# Patient Record
Sex: Female | Born: 1945 | Race: White | Hispanic: No | Marital: Married | Smoking: Never smoker
Health system: Southern US, Community
[De-identification: ages and names within clinical notes are randomized; demographics above are authoritative.]

## PROBLEM LIST (undated history)

## (undated) DIAGNOSIS — E119 Type 2 diabetes mellitus without complications: Secondary | ICD-10-CM

## (undated) DIAGNOSIS — I10 Essential (primary) hypertension: Secondary | ICD-10-CM

## (undated) DIAGNOSIS — I4891 Unspecified atrial fibrillation: Secondary | ICD-10-CM

## (undated) DIAGNOSIS — I639 Cerebral infarction, unspecified: Secondary | ICD-10-CM

## (undated) DIAGNOSIS — I499 Cardiac arrhythmia, unspecified: Secondary | ICD-10-CM

## (undated) HISTORY — PX: INSERT / REPLACE / REMOVE PACEMAKER: SUR710

## (undated) HISTORY — PX: ELBOW SURGERY: SHX618

## (undated) HISTORY — PX: CARPAL TUNNEL RELEASE: SHX101

## (undated) HISTORY — PX: SHOULDER ARTHROSCOPY: SHX128

## (undated) HISTORY — DX: Cerebral infarction, unspecified: I63.9

## (undated) HISTORY — PX: ABDOMINAL HYSTERECTOMY: SHX81

---

## 2002-12-11 ENCOUNTER — Encounter: Payer: Self-pay | Admitting: Orthopedic Surgery

## 2002-12-11 ENCOUNTER — Inpatient Hospital Stay (HOSPITAL_COMMUNITY): Admission: RE | Admit: 2002-12-11 | Discharge: 2002-12-14 | Payer: Self-pay | Admitting: Orthopedic Surgery

## 2002-12-14 ENCOUNTER — Inpatient Hospital Stay
Admission: RE | Admit: 2002-12-14 | Discharge: 2002-12-21 | Payer: Self-pay | Admitting: Physical Medicine & Rehabilitation

## 2003-04-05 HISTORY — PX: JOINT REPLACEMENT: SHX530

## 2003-07-03 ENCOUNTER — Other Ambulatory Visit: Payer: Self-pay

## 2003-07-04 ENCOUNTER — Other Ambulatory Visit: Payer: Self-pay

## 2003-07-23 ENCOUNTER — Inpatient Hospital Stay (HOSPITAL_COMMUNITY): Admission: RE | Admit: 2003-07-23 | Discharge: 2003-07-31 | Payer: Self-pay | Admitting: Orthopedic Surgery

## 2003-12-04 ENCOUNTER — Other Ambulatory Visit: Payer: Self-pay

## 2003-12-05 ENCOUNTER — Other Ambulatory Visit: Payer: Self-pay

## 2004-02-01 ENCOUNTER — Ambulatory Visit: Payer: Self-pay | Admitting: Cardiology

## 2004-07-06 ENCOUNTER — Emergency Department: Payer: Self-pay | Admitting: Emergency Medicine

## 2004-10-26 IMAGING — CR DG CHEST 2V
2 series · 2 of 2 positions shown · non-contrast
Comparison: none

CLINICAL DATA: Elevated temps.
 CHEST TWO VIEWS
 Comparison 07/24/03.  
 Cardiomegaly and prominent caliber of the pulmonary vessels compatible with vascular congestion.  Rather subtle ill defined density, posterior aspect of the right lower lobe, compatible with pneumonia/mild atelectasis.  It is possible there is some mild infiltrate at the left lateral base.
 IMPRESSION
 Findings are compatible with mild infiltrates, lower lung zones.  Cardiomegaly and vascular congestion.  No edema.

[view not recorded (1 of 2)]
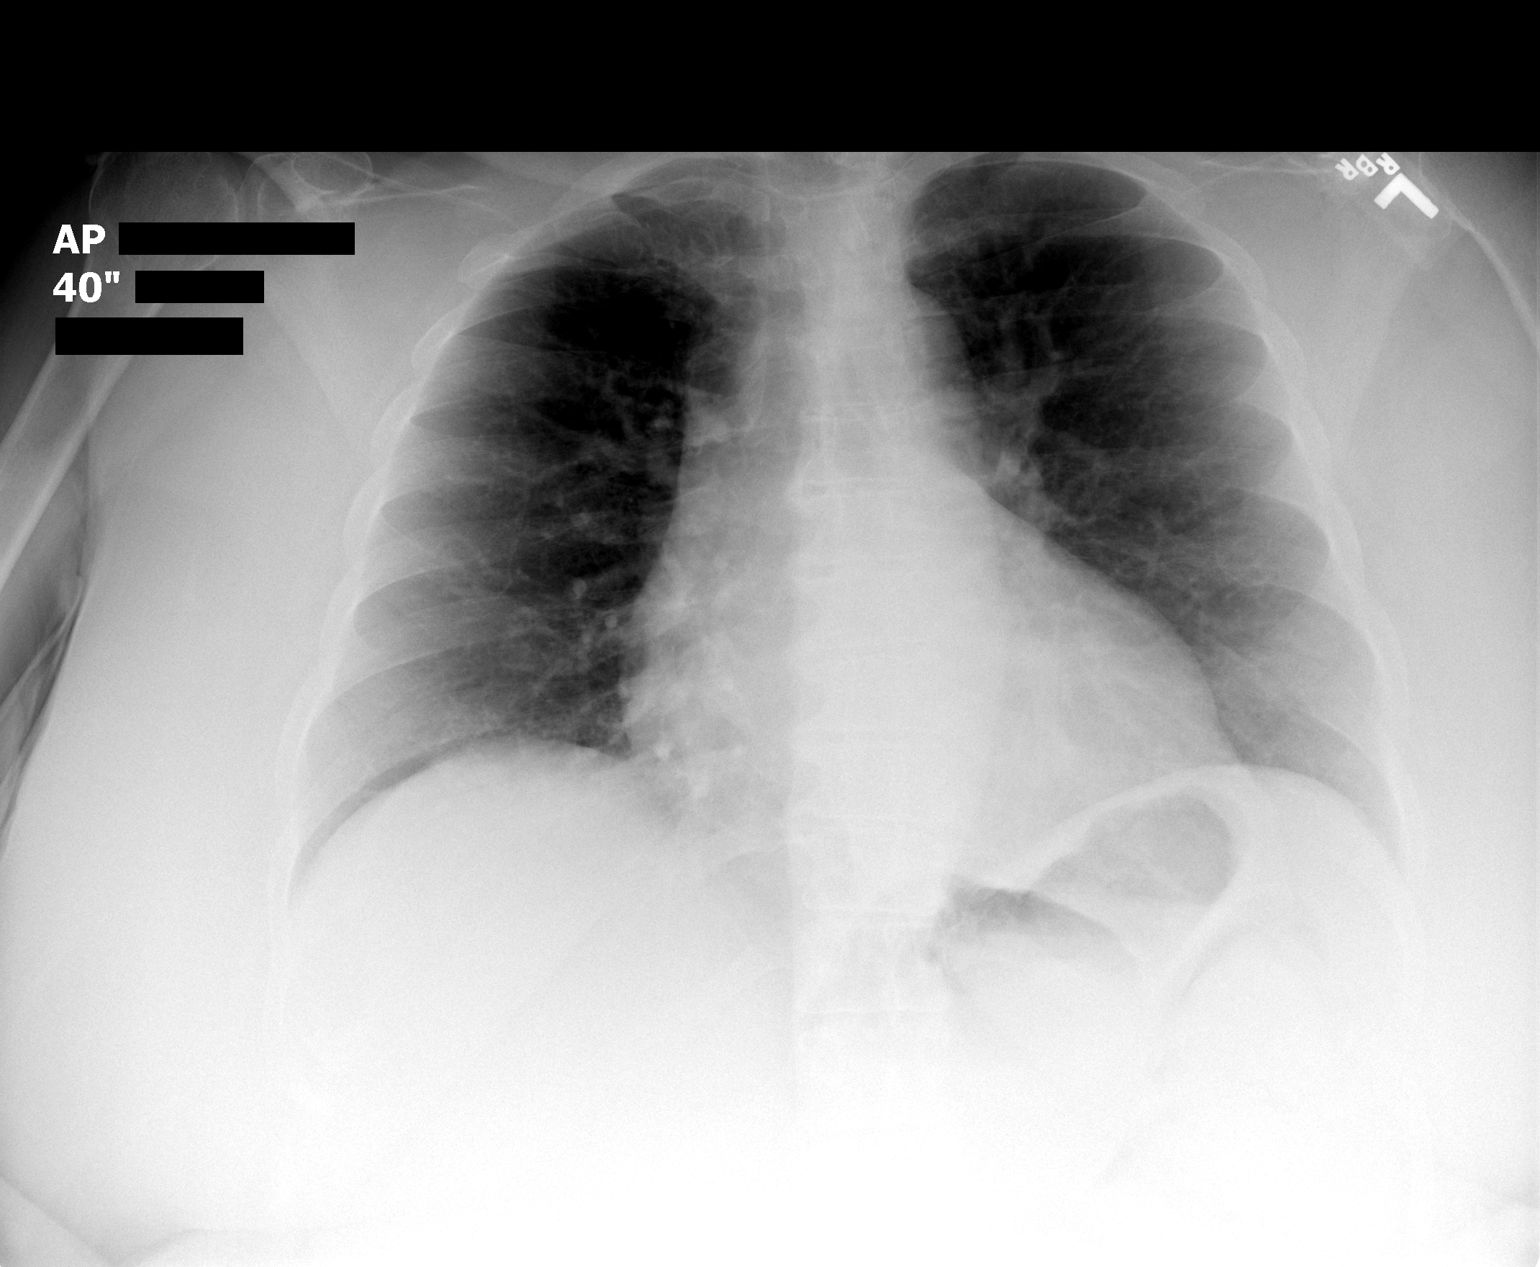

[view not recorded (2 of 2)]
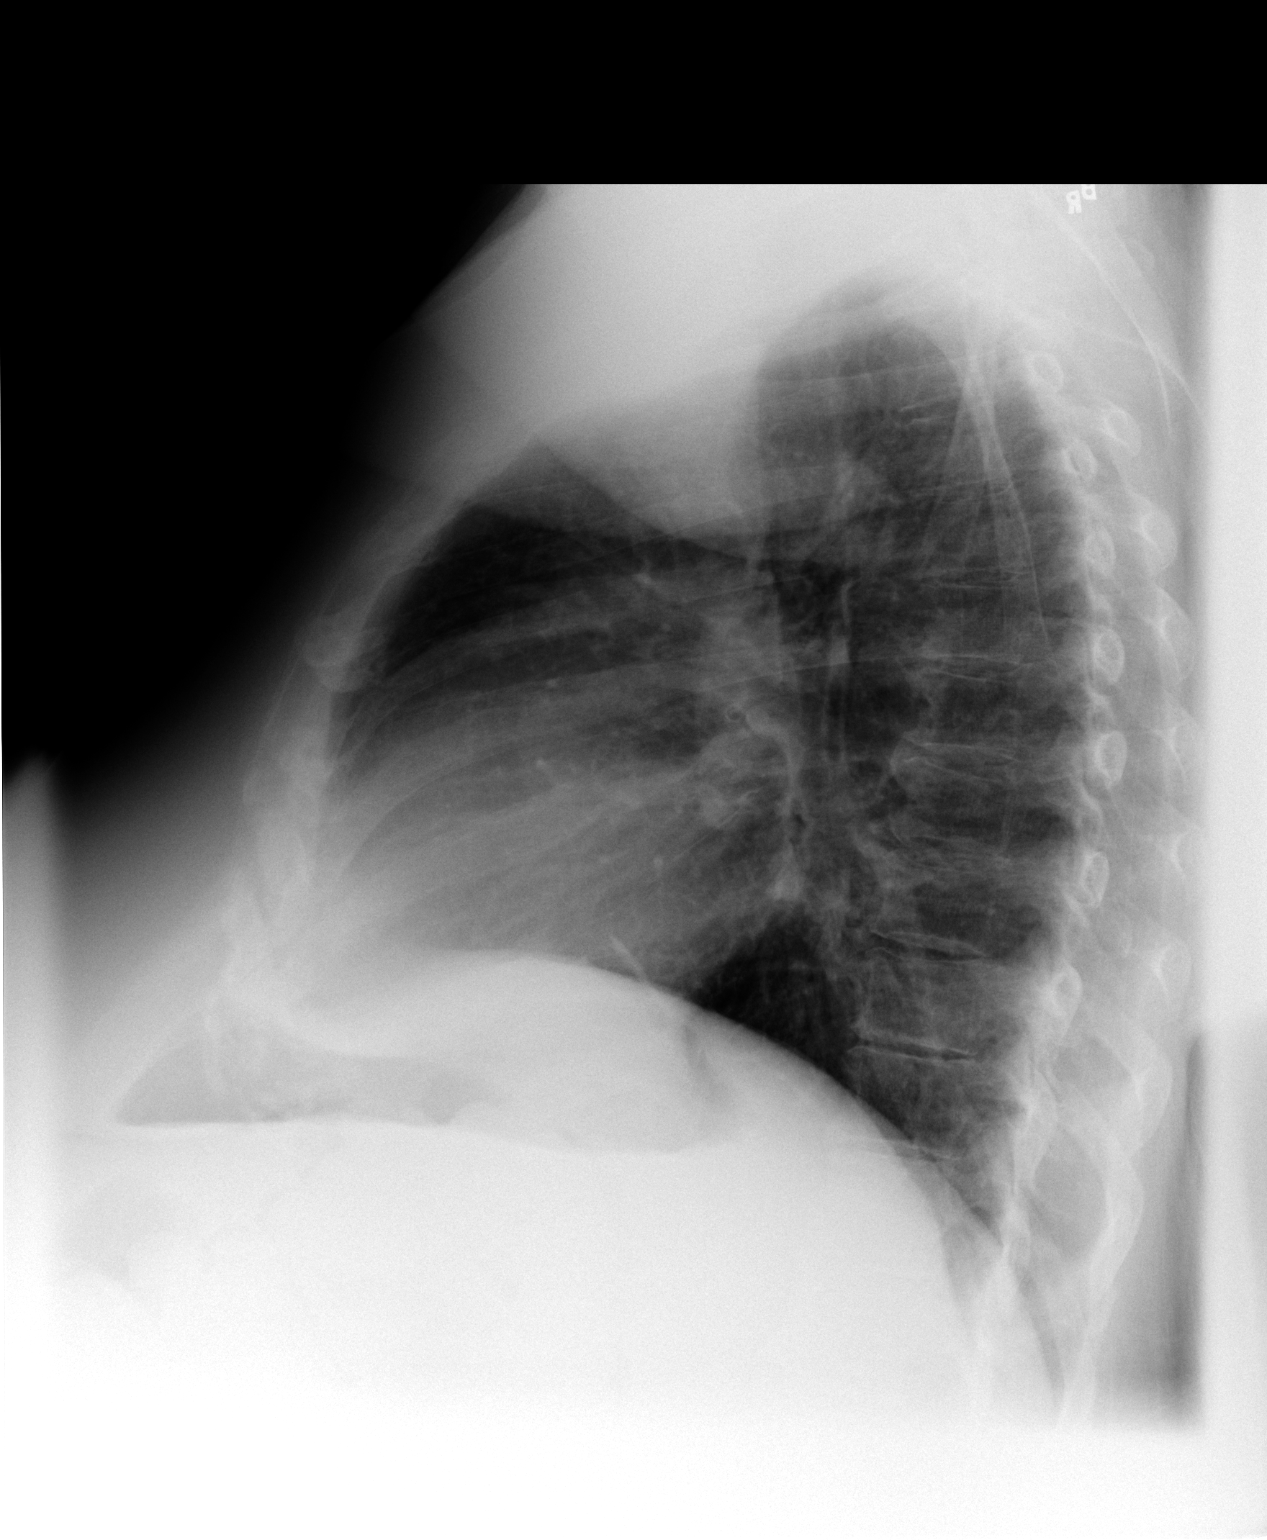

[2 of 2 positions shown; findings below may reference images not displayed]

## 2004-11-05 ENCOUNTER — Ambulatory Visit: Payer: Self-pay

## 2004-11-11 ENCOUNTER — Other Ambulatory Visit: Payer: Self-pay

## 2004-11-11 ENCOUNTER — Inpatient Hospital Stay: Payer: Self-pay | Admitting: General Surgery

## 2004-11-11 ENCOUNTER — Ambulatory Visit: Payer: Self-pay

## 2005-03-29 ENCOUNTER — Other Ambulatory Visit: Payer: Self-pay

## 2005-03-29 ENCOUNTER — Inpatient Hospital Stay: Payer: Self-pay | Admitting: Cardiology

## 2005-03-29 ENCOUNTER — Ambulatory Visit: Payer: Self-pay | Admitting: Cardiology

## 2005-04-14 ENCOUNTER — Ambulatory Visit: Payer: Self-pay | Admitting: Internal Medicine

## 2005-08-20 ENCOUNTER — Other Ambulatory Visit: Payer: Self-pay

## 2005-08-20 ENCOUNTER — Emergency Department: Payer: Self-pay | Admitting: Emergency Medicine

## 2005-12-05 ENCOUNTER — Emergency Department: Payer: Self-pay | Admitting: Emergency Medicine

## 2005-12-05 ENCOUNTER — Other Ambulatory Visit: Payer: Self-pay

## 2005-12-26 ENCOUNTER — Emergency Department: Payer: Self-pay | Admitting: Emergency Medicine

## 2006-05-08 ENCOUNTER — Emergency Department: Payer: Self-pay | Admitting: Unknown Physician Specialty

## 2006-05-08 ENCOUNTER — Other Ambulatory Visit: Payer: Self-pay

## 2006-11-15 ENCOUNTER — Ambulatory Visit: Payer: Self-pay | Admitting: Internal Medicine

## 2006-11-28 ENCOUNTER — Ambulatory Visit: Payer: Self-pay | Admitting: Internal Medicine

## 2007-04-15 ENCOUNTER — Emergency Department: Payer: Self-pay | Admitting: Emergency Medicine

## 2007-07-26 ENCOUNTER — Inpatient Hospital Stay: Payer: Self-pay | Admitting: Internal Medicine

## 2007-07-26 ENCOUNTER — Other Ambulatory Visit: Payer: Self-pay

## 2007-08-03 ENCOUNTER — Ambulatory Visit: Payer: Self-pay | Admitting: Internal Medicine

## 2007-12-05 ENCOUNTER — Ambulatory Visit: Payer: Self-pay | Admitting: Internal Medicine

## 2008-02-14 ENCOUNTER — Ambulatory Visit: Payer: Self-pay | Admitting: Internal Medicine

## 2008-05-17 ENCOUNTER — Inpatient Hospital Stay: Payer: Self-pay | Admitting: Internal Medicine

## 2008-09-26 ENCOUNTER — Ambulatory Visit: Payer: Self-pay | Admitting: Internal Medicine

## 2009-01-02 ENCOUNTER — Ambulatory Visit: Payer: Self-pay | Admitting: General Surgery

## 2009-01-02 HISTORY — PX: COLONOSCOPY: SHX174

## 2009-01-16 ENCOUNTER — Ambulatory Visit: Payer: Self-pay | Admitting: Internal Medicine

## 2009-05-25 ENCOUNTER — Emergency Department: Payer: Self-pay | Admitting: Emergency Medicine

## 2010-01-18 ENCOUNTER — Ambulatory Visit: Payer: Self-pay | Admitting: Internal Medicine

## 2011-02-28 ENCOUNTER — Ambulatory Visit: Payer: Self-pay | Admitting: Internal Medicine

## 2011-03-04 ENCOUNTER — Ambulatory Visit: Payer: Self-pay | Admitting: Internal Medicine

## 2011-05-12 ENCOUNTER — Ambulatory Visit: Payer: Self-pay | Admitting: Unknown Physician Specialty

## 2011-05-12 LAB — BASIC METABOLIC PANEL
Chloride: 102 mmol/L (ref 98–107)
EGFR (Non-African Amer.): >60
Osmolality: 286 (ref 275–301)
Potassium: 3.9 mmol/L (ref 3.5–5.1)
Sodium: 143 mmol/L (ref 136–145)

## 2011-05-12 LAB — URINALYSIS, COMPLETE
Ketone: NEGATIVE
Protein: NEGATIVE
Specific Gravity: 1.004 (ref 1.003–1.030)
Squamous Epithelial: 1
WBC UR: <1 /HPF (ref 0–5)

## 2011-05-12 LAB — CBC
HCT: 34.7 % — ABNORMAL LOW (ref 35.0–47.0)
HGB: 11.6 g/dL — ABNORMAL LOW (ref 12.0–16.0)
MCH: 31.9 pg (ref 26.0–34.0)
MCV: 95 fL (ref 80–100)
RBC: 3.65 10*6/uL — ABNORMAL LOW (ref 3.80–5.20)

## 2011-05-18 ENCOUNTER — Ambulatory Visit: Payer: Self-pay | Admitting: Unknown Physician Specialty

## 2011-05-18 LAB — PROTIME-INR: Prothrombin Time: 13.3 secs (ref 11.5–14.7)

## 2011-12-30 ENCOUNTER — Ambulatory Visit: Payer: Self-pay | Admitting: Internal Medicine

## 2012-01-08 ENCOUNTER — Emergency Department: Payer: Self-pay | Admitting: Emergency Medicine

## 2012-01-08 LAB — URINALYSIS, COMPLETE
Hyaline Cast: 25
Ketone: NEGATIVE
Protein: NEGATIVE
Specific Gravity: 1.015 (ref 1.003–1.030)
Squamous Epithelial: 8
WBC UR: 7 /HPF (ref 0–5)

## 2012-01-08 LAB — CBC
MCH: 31.5 pg (ref 26.0–34.0)
MCHC: 33.9 g/dL (ref 32.0–36.0)
MCV: 93 fL (ref 80–100)
Platelet: 226 10*3/uL (ref 150–440)
RDW: 13.9 % (ref 11.5–14.5)

## 2012-01-08 LAB — BASIC METABOLIC PANEL
Calcium, Total: 8.3 mg/dL — ABNORMAL LOW (ref 8.5–10.1)
Chloride: 101 mmol/L (ref 98–107)
Co2: 21 mmol/L (ref 21–32)
Glucose: 178 mg/dL — ABNORMAL HIGH (ref 65–99)
Sodium: 136 mmol/L (ref 136–145)

## 2012-01-08 LAB — TROPONIN I: Troponin-I: <0.02 ng/mL

## 2012-02-29 ENCOUNTER — Ambulatory Visit: Payer: Self-pay | Admitting: Internal Medicine

## 2012-07-21 ENCOUNTER — Emergency Department: Payer: Self-pay | Admitting: Internal Medicine

## 2012-07-21 ENCOUNTER — Encounter (HOSPITAL_COMMUNITY): Payer: Self-pay

## 2012-07-21 ENCOUNTER — Inpatient Hospital Stay (HOSPITAL_COMMUNITY)
Admission: AD | Admit: 2012-07-21 | Discharge: 2012-07-25 | DRG: 065 | Disposition: A | Discharge status: Home / Self Care | Payer: Medicare Other | Source: Other Acute Inpatient Hospital | Attending: Neurology | Admitting: Neurology

## 2012-07-21 DIAGNOSIS — I619 Nontraumatic intracerebral hemorrhage, unspecified: Secondary | ICD-10-CM

## 2012-07-21 DIAGNOSIS — Z794 Long term (current) use of insulin: Secondary | ICD-10-CM

## 2012-07-21 DIAGNOSIS — E785 Hyperlipidemia, unspecified: Secondary | ICD-10-CM | POA: Diagnosis present

## 2012-07-21 DIAGNOSIS — Z950 Presence of cardiac pacemaker: Secondary | ICD-10-CM

## 2012-07-21 DIAGNOSIS — I4891 Unspecified atrial fibrillation: Secondary | ICD-10-CM | POA: Diagnosis present

## 2012-07-21 DIAGNOSIS — I10 Essential (primary) hypertension: Secondary | ICD-10-CM

## 2012-07-21 DIAGNOSIS — R569 Unspecified convulsions: Secondary | ICD-10-CM | POA: Diagnosis present

## 2012-07-21 DIAGNOSIS — Z6841 Body Mass Index (BMI) 40.0 and over, adult: Secondary | ICD-10-CM

## 2012-07-21 DIAGNOSIS — Z7901 Long term (current) use of anticoagulants: Secondary | ICD-10-CM

## 2012-07-21 DIAGNOSIS — E669 Obesity, unspecified: Secondary | ICD-10-CM | POA: Diagnosis present

## 2012-07-21 DIAGNOSIS — E119 Type 2 diabetes mellitus without complications: Secondary | ICD-10-CM

## 2012-07-21 DIAGNOSIS — R4701 Aphasia: Secondary | ICD-10-CM | POA: Diagnosis present

## 2012-07-21 DIAGNOSIS — I639 Cerebral infarction, unspecified: Secondary | ICD-10-CM

## 2012-07-21 DIAGNOSIS — G8190 Hemiplegia, unspecified affecting unspecified side: Secondary | ICD-10-CM | POA: Diagnosis present

## 2012-07-21 DIAGNOSIS — Z79899 Other long term (current) drug therapy: Secondary | ICD-10-CM

## 2012-07-21 HISTORY — DX: Type 2 diabetes mellitus without complications: E11.9

## 2012-07-21 HISTORY — DX: Unspecified atrial fibrillation: I48.91

## 2012-07-21 HISTORY — DX: Cardiac arrhythmia, unspecified: I49.9

## 2012-07-21 HISTORY — DX: Essential (primary) hypertension: I10

## 2012-07-21 HISTORY — DX: Cerebral infarction, unspecified: I63.9

## 2012-07-21 LAB — URINALYSIS, COMPLETE
Bacteria: NONE SEEN
Blood: NEGATIVE
Leukocyte Esterase: NEGATIVE
Nitrite: NEGATIVE
Ph: 7 (ref 4.5–8.0)
RBC,UR: NONE SEEN /HPF (ref 0–5)
Specific Gravity: 1.01 (ref 1.003–1.030)

## 2012-07-21 LAB — CBC
HCT: 32.2 % — ABNORMAL LOW (ref 36.0–46.0)
MCH: 31.5 pg (ref 26.0–34.0)
MCV: 93 fL (ref 80–100)
MCV: 89.7 fL (ref 78.0–100.0)
RDW: 13.2 % (ref 11.5–15.5)
WBC: 9.7 10*3/uL (ref 4.0–10.5)

## 2012-07-21 LAB — COMPREHENSIVE METABOLIC PANEL
Albumin: 3.6 g/dL (ref 3.5–5.2)
Alkaline Phosphatase: 80 U/L (ref 50–136)
BUN: 17 mg/dL (ref 7–18)
BUN: 16 mg/dL (ref 6–23)
Bilirubin,Total: 0.4 mg/dL (ref 0.2–1.0)
CO2: 30 mEq/L (ref 19–32)
Calcium, Total: 9.1 mg/dL (ref 8.5–10.1)
Chloride: 100 mEq/L (ref 96–112)
Creatinine, Ser: 0.87 mg/dL (ref 0.50–1.10)
EGFR (Non-African Amer.): 54 — ABNORMAL LOW
GFR calc non Af Amer: 69 mL/min — ABNORMAL LOW (ref >90)
Osmolality: 282 (ref 275–301)
SGOT(AST): 20 U/L (ref 15–37)
Sodium: 138 mmol/L (ref 136–145)
Total Bilirubin: 0.4 mg/dL (ref 0.3–1.2)

## 2012-07-21 LAB — TROPONIN I: Troponin-I: <0.02 ng/mL

## 2012-07-21 LAB — PROTIME-INR
INR: 1.5
INR: 1.29 (ref 0.00–1.49)
Prothrombin Time: 15.8 seconds — ABNORMAL HIGH (ref 11.6–15.2)

## 2012-07-21 MED ORDER — ACETAMINOPHEN 650 MG RE SUPP: 650.0000 mg | RECTAL | Status: DC | PRN

## 2012-07-21 MED ORDER — SENNOSIDES-DOCUSATE SODIUM 8.6-50 MG PO TABS
1.0000 | ORAL_TABLET | Freq: Two times a day (BID) | ORAL | Status: DC
  Administered 2012-07-22 – 2012-07-24 (×5): 1 via ORAL
  Filled 2012-07-21 (×8): qty 1

## 2012-07-21 MED ORDER — INSULIN ASPART 100 UNIT/ML ~~LOC~~ SOLN
2.0000 [IU] | SUBCUTANEOUS | Status: DC
  Administered 2012-07-21: 2 [IU] via SUBCUTANEOUS

## 2012-07-21 MED ORDER — FUROSEMIDE 20 MG PO TABS
20.0000 mg | ORAL_TABLET | Freq: Every day | ORAL | Status: DC
  Administered 2012-07-23 – 2012-07-25 (×3): 20 mg via ORAL
  Filled 2012-07-21 (×4): qty 1

## 2012-07-21 MED ORDER — CITALOPRAM HYDROBROMIDE 10 MG PO TABS
10.0000 mg | ORAL_TABLET | Freq: Every day | ORAL | Status: DC
  Administered 2012-07-22 – 2012-07-25 (×4): 10 mg via ORAL
  Filled 2012-07-21 (×4): qty 1

## 2012-07-21 MED ORDER — LABETALOL HCL 5 MG/ML IV SOLN: 10.0000 mg | INTRAVENOUS | Status: DC | PRN

## 2012-07-21 MED ORDER — MORPHINE SULFATE 2 MG/ML IJ SOLN
1.0000 mg | Freq: Once | INTRAMUSCULAR | Status: AC
  Administered 2012-07-21: 1 mg via INTRAVENOUS
  Filled 2012-07-21: qty 1

## 2012-07-21 MED ORDER — ONDANSETRON HCL 4 MG/2ML IJ SOLN: 4.0000 mg | Freq: Four times a day (QID) | INTRAMUSCULAR | Status: DC | PRN

## 2012-07-21 MED ORDER — ACETAMINOPHEN 325 MG PO TABS
650.0000 mg | ORAL_TABLET | ORAL | Status: DC | PRN
  Administered 2012-07-21 – 2012-07-24 (×5): 650 mg via ORAL
  Filled 2012-07-21 (×2): qty 2
  Filled 2012-07-21: qty 1
  Filled 2012-07-21 (×2): qty 2
  Filled 2012-07-21: qty 1

## 2012-07-21 MED ORDER — PANTOPRAZOLE SODIUM 40 MG IV SOLR
40.0000 mg | Freq: Every day | INTRAVENOUS | Status: DC
  Administered 2012-07-21 – 2012-07-23 (×3): 40 mg via INTRAVENOUS
  Filled 2012-07-21 (×5): qty 40

## 2012-07-21 MED ORDER — SODIUM CHLORIDE 0.9 % IV SOLN
INTRAVENOUS | Status: DC
  Administered 2012-07-21 – 2012-07-22 (×2): via INTRAVENOUS

## 2012-07-21 MED ORDER — SODIUM CHLORIDE 0.9 % IV SOLN
1000.0000 mg | Freq: Two times a day (BID) | INTRAVENOUS | Status: DC
  Administered 2012-07-21 – 2012-07-24 (×6): 1000 mg via INTRAVENOUS
  Filled 2012-07-21 (×7): qty 10

## 2012-07-21 MED ORDER — INSULIN ASPART 100 UNIT/ML ~~LOC~~ SOLN
2.0000 [IU] | Freq: Three times a day (TID) | SUBCUTANEOUS | Status: DC
  Administered 2012-07-22 (×2): 2 [IU] via SUBCUTANEOUS

## 2012-07-21 NOTE — Evaluation (Signed)
Speech Language Pathology Evaluation Patient Details Name: Traci Carter MRN: [id number] DOB: 04/10/1945 Today's Date: 07/21/2012 Time: 4098-1191 SLP Time Calculation (min): 44 min  Problem List: There is no problem list on file for this patient.  Past Medical History: No past medical history on file. Past Surgical History: No past surgical history on file. HPI:  67 y/o female admitted to [facility] by EMS from home.  Per medical report patient with seizures at home prior to EMS arrival.  Patient with right sided facial droop, right sided weakness, and difficulty speaking.  CT  indicates left parietal intracranial hemorrrhage.  Cognitive Linguistic Evaluation indicated per stroke protocol.    Assessment / Plan / Recommendation Clinical Impression Cognitive Linguistic Evaluation completed.     Results indicate moderate expressive aphasia.  Cognitive evaluation limited secondary to aphasia.  Patient with capacity to produce real words as evidenced by incidental speech during conversational speech.  Structured speech tasks resulted in high prevalence of paraphasias ( literal, verbal and neologistic).  Receptive language intact for  Yes/no questions and completion of one-to two-step commands.   Paraphasias present during reading aloud task but patient able to write to name and words w/o difficulty.  Recommend Skilled ST treatment in acute care setting to improve functional communication skills.   SLP Assessment  Patient needs continued Speech Lanaguage Pathology Services    Follow Up Recommendations  Inpatient Rehab    Frequency and Duration min 2x/week  2 weeks      SLP Goals  SLP Goals 1.  Label 10/10 items used in ADL's during confrontational naming task with moderate combination of cueing types to utilize speech strategies 2.  Repeat 5/5 SLP's production of simple phrases with no evidence of paraphasias.  3.  Produce automatic speech acts with moderate verbal  prompts to reduce perseveration  Potential to Achieve Goals: Good Potential Considerations: Cooperation/participation level;Previous level of function  SLP Evaluation Prior Functioning  Cognitive/Linguistic Baseline: Within functional limits Type of Home: House Lives With: Spouse Available Help at Discharge: Available 24 hours/day Education: 12th grade  Vocation: Retired   IT consultant  Overall Cognitive Status: Impaired/Different from baseline Arousal/Alertness: Awake/alert Orientation Level: Oriented X4 Attention: Divided Divided Attention: Impaired Memory: Appears intact Awareness: Appears intact Problem Solving: Impaired Problem Solving Impairment: Verbal basic;Functional basic Behaviors: Perseveration Safety/Judgment: Appears intact    Comprehension  Auditory Comprehension Yes/No Questions: Within Functional Limits Commands: Within Functional Limits Conversation: Complex EffectiveTechniques: Extra processing time Visual Recognition/Discrimination Discrimination: Not tested Reading Comprehension Reading Status: Impaired Word level: Impaired Sentence Level: Impaired Functional Environmental (signs, name badge): Impaired    Expression Expression Primary Mode of Expression: Verbal Verbal Expression Overall Verbal Expression: Impaired Initiation: Impaired Automatic Speech: Counting;Day of week;Month of year Level of Generative/Spontaneous Verbalization: Word Repetition: Impaired Level of Impairment: Word level;Phrase level;Sentence level Naming: Impairment Responsive: 0-25% accurate Confrontation: Impaired Convergent: 0-24% accurate Divergent: 0-24% accurate Verbal Errors: Semantic paraphasias;Phonemic paraphasias;Perseveration;Aware of errors Pragmatics: No impairment Interfering Components: Speech intelligibility Effective Techniques: Open ended questions;Semantic cues;Sentence completion;Phonemic cues Non-Verbal Means of Communication: Not applicable   Oral  / Motor Oral Motor/Sensory Function Overall Oral Motor/Sensory Function: Impaired Labial ROM: Reduced right Labial Symmetry: Abnormal symmetry right Labial Strength: Reduced Labial Sensation: Reduced Lingual ROM: Reduced right Lingual Symmetry: Abnormal symmetry right Lingual Strength: Reduced Lingual Sensation: Reduced Facial ROM: Reduced right Facial Symmetry: Within Functional Limits Facial Strength: Within Functional Limits Facial Sensation: Within Functional Limits Velum: Within Functional Limits Mandible: Within Functional Limits Motor Speech Overall Motor Speech:  Impaired Respiration: Within functional limits Articulation: Impaired Intelligibility: Intelligibility reduced Word: 25-49% accurate Phrase: 0-24% accurate Sentence: 0-24% accurate Motor Planning: Impaired Level of Impairment: Phrase Motor Speech Errors: Aware   Doreene Nest MS, CCC-SLP [phone] [facility] 07/21/2012, 3:56 PM

## 2012-07-21 NOTE — H&P (Addendum)
  CC: aphasia, seizure  History is obtained from:patient  HPI: Traci Carter is a 67 y.o. female with a history of htn, DM,a fib on coumadin who had a egneralized seizure this morning. She intially develped difficulty speaking last night, but then this mornign had seizure activity. She was taken to [facility] where she was given ativan and a CT showed a small left frontal hemorrhage. She was therefore transferred to [facility].    LKW: last night tpa given: no, hemorrhage  ROS: A 14 point ROS was performed and is negative except as noted in the HPI.  PMH  HTN DM Afib  Social History: Tob: neg  Exam: Current vital signs: BP 154/77  Pulse 83  Temp(Src) 98.2 F (36.8 C)  Resp 15  Ht 5\' 2"  (1.575 m)  Wt 102.3 kg (225 lb 8.5 oz)  BMI 41.24 kg/m2  SpO2 95% Vital signs in last 24 hours: Temp:  [98.2 F (36.8 C)] 98.2 F (36.8 C) (04/19 1200) Pulse Rate:  [83] 83 (04/19 1200) Resp:  [15] 15 (04/19 1200) BP: (154)/(77) 154/77 mmHg (04/19 1200) SpO2:  [95 %] 95 % (04/19 1200) Weight:  [102.3 kg (225 lb 8.5 oz)] 102.3 kg (225 lb 8.5 oz) (04/19 1200)  General: in bed, NAD CV: RRR Mental Status: Patient is awake, alert, oriented to person, place, month, year, and situation. She has a mild expressive aphasia, but is able to understand and follow commands.  Cranial Nerves: II: Visual Fields are full. Pupils are equal, round, and reactive to light.   III,IV, VI: EOMI without ptosis or diploplia.  V: Facial sensation is symmetric to temperature VII: Facial movement is symmetric.  VIII: hearing is intact to voice X: Uvula elevates symmetrically XI: Shoulder shrug is symmetric. XII: tongue is midline without atrophy or fasciculations.  Motor: Tone is normal. Bulk is normal. 5/5 strength was present in all four extremities.  Sensory: Sensation is symmetric to light touch and temperature in the arms and legs. Deep Tendon Reflexes: 2+ and symmetric in the biceps and patellae.   Plantars: Toes are downgoing bilaterally.  Cerebellar: FNF and HKS are intact bilaterally Gait: Not tested   I have reviewed labs in epic and the results pertinent to this consultation are: Initial INR 1.5, after 2 U FFP 1.29.   Reviewed CT head, small left frontal hematoma.   Impression: 67 yo F with small left frontal hematoma who presented with seizure. I suspect bleed started last night when her speech problem started. INR has corrected to < 1.3 and she received 10mg  vit K. Will monitor tonight in ICU.   Recommendations: 1) BP control, < 160/90 2) restart home lasix, citalopram 3) ICU hyperglycemia protocol.  4) Keppra 1000mg  BID 5) no antiplatelets or anticoagulants.  6) SCDs for prophy 7) Labetalol for BP control 8) PT,OT, ST  This patient is critically ill and at significant risk of neurological worsening, death and care requires constant monitoring of vital signs, hemodynamics,respiratory and cardiac monitoring, neurological assessment, discussion with family, other specialists and medical decision making of high complexity. I spent 45 minutes of neurocritical care time  in the care of  this patient.  Ritta Slot, MD [facility] [phone]  If 7pm- 7am, please page neurology on call at [phone].  07/21/2012  8:16 PM

## 2012-07-22 ENCOUNTER — Inpatient Hospital Stay (HOSPITAL_COMMUNITY): Payer: Medicare Other

## 2012-07-22 ENCOUNTER — Encounter (HOSPITAL_COMMUNITY): Payer: Self-pay | Admitting: *Deleted

## 2012-07-22 DIAGNOSIS — I6350 Cerebral infarction due to unspecified occlusion or stenosis of unspecified cerebral artery: Secondary | ICD-10-CM

## 2012-07-22 LAB — GLUCOSE, CAPILLARY

## 2012-07-22 MED ORDER — IOHEXOL 350 MG/ML SOLN
50.0000 mL | Freq: Once | INTRAVENOUS | Status: AC | PRN
  Administered 2012-07-22: 50 mL via INTRAVENOUS

## 2012-07-22 MED ORDER — INSULIN ASPART 100 UNIT/ML ~~LOC~~ SOLN
2.0000 [IU] | Freq: Three times a day (TID) | SUBCUTANEOUS | Status: DC
  Administered 2012-07-22: 4 [IU] via SUBCUTANEOUS
  Administered 2012-07-23: 2 [IU] via SUBCUTANEOUS
  Administered 2012-07-23: 4 [IU] via SUBCUTANEOUS
  Administered 2012-07-23: 2 [IU] via SUBCUTANEOUS
  Administered 2012-07-24: 4 [IU] via SUBCUTANEOUS

## 2012-07-22 MED ORDER — HYDROCODONE-ACETAMINOPHEN 5-325 MG PO TABS
1.0000 | ORAL_TABLET | ORAL | Status: DC | PRN
  Administered 2012-07-22: 1 via ORAL
  Administered 2012-07-22 – 2012-07-23 (×3): 2 via ORAL
  Filled 2012-07-22 (×3): qty 2
  Filled 2012-07-22: qty 1

## 2012-07-22 NOTE — Progress Notes (Addendum)
Speech Language Pathology Treatment Patient Details Name: HIAWATHA DRESSEL MRN: [id number] DOB: [birth year] Today's Date: 07/22/2012 Time: 1345-1400 SLP Time Calculation (min): 15 min  Assessment / Plan / Recommendation    SLP Plan  Discharge SLP treatment due to (comment);All goals met       SLP Goals  SLP Goals SLP Goal #1: Label 10/10 objects used in ADL's during confrontational naming task with moderate combination of cueing types to utilize speech strategies.  SLP Goal #1 - Progress: Met SLP Goal #2: Repeat 5/5 SLP's production of simple phrases with no evidence of paraphasas.  SLP Goal #2 - Progress: Not met SLP Goal #3: Produce automatic speech acts with moderate verbal prompts to reduce perseveration.  SLP Goal #3 - Progress: Not met  General Temperature Spikes Noted: No Respiratory Status: Room air Behavior/Cognition: Alert;Cooperative;Pleasant mood Patient Positioning: Upright in chair  Oral Cavity - Oral Hygiene Does patient have any of the following "at risk" factors?: None of the above Brush patient's teeth BID with toothbrush (using toothpaste with fluoride): Yes   Treatment Treatment focused on: Aphasia Skilled Treatment: Focus of treatment to improve expressive language skills to improve functional communication.  Improvements noted in comparison with evaluation completed on 07/21/12.   Categorical and phonemic cues effective in improving word recall during conversational speech and during structured tasks.  Decrease in paraphasias in connected speech but continues when reading aloud.  Patient aware of speech errors and attempts to self correct. Recommend to discharge from Skilled ST in acute care as expressive language skills functional in this setting.  Recommend continued ST treatment at next level of care in outpatient setting.  Patient and spouse present agree to POC.    GO    Moreen Fowler MS, CCC-SLP [phone] [facility] 07/22/2012, 2:54 PM

## 2012-07-22 NOTE — Evaluation (Signed)
Physical Therapy Evaluation Patient Details Name: Traci Carter MRN: [id number] DOB: 1946-01-08 Today's Date: 07/22/2012 Time: 1140-1200 PT Time Calculation (min): 20 min  PT Assessment / Plan / Recommendation Clinical Impression  patient admitted s/p seizure, CT showed a small left frontal hemorrhage.   patient did well with mobility, mostly limited by balance today, most likely due to bedrest.  Patient close to baseline per husband.  Would like to try her with a cane and see how she does.  Do not feel patient will need any follow up PT.    PT Assessment  Patient needs continued PT services    Follow Up Recommendations  No PT follow up    Does the patient have the potential to tolerate intense rehabilitation      Barriers to Discharge        Equipment Recommendations  None recommended by PT    Recommendations for Other Services     Frequency Min 3X/week    Precautions / Restrictions Precautions Precautions: Fall   Pertinent Vitals/Pain Denies pain      Mobility  Bed Mobility Bed Mobility: Supine to Sit Supine to Sit: 4: Min assist Transfers Transfers: Sit to Stand;Stand to Sit Sit to Stand: 4: Min assist Stand to Sit: 4: Min assist Ambulation/Gait Ambulation/Gait Assistance: 4: Min Environmental consultant (Feet): 80 Feet Assistive device: 1 person hand held assist Ambulation/Gait Assistance Details: patient kept reaching for items to hold on to, so ambulated with HHA Gait Pattern: Step-through pattern    Exercises     PT Diagnosis: Generalized weakness  PT Problem List: Decreased balance PT Treatment Interventions: Gait training;Balance training   PT Goals Acute Rehab PT Goals PT Goal Formulation: With patient Time For Goal Achievement: 07/29/12 Potential to Achieve Goals: Good Pt will go Sit to Stand: with modified independence PT Goal: Sit to Stand - Progress: Goal set today Pt will go Stand to Sit: with modified independence PT Goal: Stand to Sit  - Progress: Goal set today Pt will Stand: with modified independence;3 - 5 min;with no upper extremity support PT Goal: Stand - Progress: Goal set today Pt will Ambulate: 51 - 150 feet;with modified independence;with cane PT Goal: Ambulate - Progress: Goal set today  Visit Information  Last PT Received On: 07/22/12 Assistance Needed: +1    Subjective Data  Subjective: I have already been up this morning Patient Stated Goal: To go home    Prior Functioning  Home Living Lives With: Spouse Available Help at Discharge: Available 24 hours/day Type of Home: House Home Access: Stairs to enter Secretary/administrator of Steps: 3 Entrance Stairs-Rails: Right Home Layout: One level Home Adaptive Equipment: Walker - rolling;Straight cane Prior Function Level of Independence: Independent (occasionally used RW/Cane) Able to Take Stairs?: Yes Vocation: Retired Musician: Expressive difficulties    Copywriter, advertising Arousal/Alertness: Awake/alert Behavior During Therapy: WFL for tasks assessed/performed Overall Cognitive Status: Within Functional Limits for tasks assessed    Extremity/Trunk Assessment Right Upper Extremity Assessment RUE ROM/Strength/Tone: [facility] for tasks assessed Left Upper Extremity Assessment LUE ROM/Strength/Tone: WFL for tasks assessed Right Lower Extremity Assessment RLE ROM/Strength/Tone: Within functional levels Left Lower Extremity Assessment LLE ROM/Strength/Tone: Within functional levels Trunk Assessment Trunk Assessment: Normal   Balance Balance Balance Assessed: Yes Static Sitting Balance Static Sitting - Balance Support: Bilateral upper extremity supported Static Sitting - Level of Assistance: 6: Modified independent (Device/Increase time) Static Standing Balance Static Standing - Balance Support: No upper extremity supported Static Standing - Level of Assistance:  5: Stand by assistance  End of Session PT - End of  Session Equipment Utilized During Treatment: Gait belt Activity Tolerance: Patient tolerated treatment well Patient left: in chair;with family/visitor present;with call bell/phone within reach  GP     [name], [place] [phone] 07/22/2012, 12:34 PM

## 2012-07-22 NOTE — Progress Notes (Signed)
VASCULAR LAB PRELIMINARY  PRELIMINARY  PRELIMINARY  PRELIMINARY  Carotid duplex completed.    Preliminary report:  Bilateral:  No evidence of hemodynamically significant internal carotid artery stenosis.   Vertebral artery flow is antegrade.     Traci Carter, RVS 07/22/2012, 3:22 PM

## 2012-07-22 NOTE — Progress Notes (Signed)
Patient arrived to floor in [facility]; alert and oriented. Able to make transfer easily from chair to bed with one stand by assist.  Oriented patient to room and floor protocols.  Instructed on need for bed alarm for safety reminders. Vitals taken by tech as well as blood sugar.  No questions verbalized at this time.

## 2012-07-22 NOTE — Progress Notes (Signed)
Subjective: Feels speech is slightly better this AM. BP ok.   Exam: Filed Vitals:   07/22/12 1234  BP: 149/80  Pulse: 71  Temp:   Resp: 16   Gen: In bed, NAD MS: Awake, Alert, oriented to person, place, time and situation [id number], VFF, EOMI Motor: 5/5 throughout Sensory:intact to LT DTR:2+ and symmetric in arms and legs.  + palmomental reflex  I reviewed CT head - stable hemorrhage.  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 Differential includes amyloid(though relatively young), AVM, hemorrhagic infarction. She has atrial fibrillation and was subtheraputic on coumadin on presentation, so this is a possibility.   Recommendations: 1) CTA head 2) Continue BP control 3) Can transfer to the floor today 4) PT,OT 5) SSI for DM 6) Not on BP meds prior to arrival, meeting goals currently without PRNs.  7) Will check LDL, carotid dopplers given possibility of hemorrhagic infarction,   Ritta Slot, MD [facility] [phone]  If 7pm- 7am, please page neurology on call at [phone].

## 2012-07-23 LAB — LIPID PANEL
Cholesterol: 169 mg/dL (ref 0–200)
Total CHOL/HDL Ratio: 2.1 RATIO
Triglycerides: 69 mg/dL (ref <150)
VLDL: 14 mg/dL (ref 0–40)

## 2012-07-23 LAB — GLUCOSE, CAPILLARY: Glucose-Capillary: 122 mg/dL — ABNORMAL HIGH (ref 70–99)

## 2012-07-23 MED ORDER — LABETALOL HCL 5 MG/ML IV SOLN
10.0000 mg | Freq: Once | INTRAVENOUS | Status: AC
  Administered 2012-07-23: 10 mg via INTRAVENOUS
  Filled 2012-07-23: qty 4

## 2012-07-23 NOTE — Progress Notes (Addendum)
Physical Therapy Treatment and discharge Patient Details Name: Traci Carter MRN: [id number] DOB: [birth year] Today's Date: 07/23/2012 Time: 4098-1191 PT Time Calculation (min): 8 min  PT Assessment / Plan / Recommendation Comments on Treatment Session  patient ambulated with modified i ndependence with cane with no apparent balance deficits, tolerating pertubations during gait with no loss of balance.  All goals met and no further PT needs identified.  Will sign off.    Follow Up Recommendations  No PT follow up     Does the patient have the potential to tolerate intense rehabilitation     Barriers to Discharge        Equipment Recommendations  None recommended by PT    Recommendations for Other Services    Frequency     Plan All goals met and education completed, patient dischaged from PT services    Precautions / Restrictions     Pertinent Vitals/Pain No pain    Mobility  Bed Mobility Bed Mobility: Supine to Sit Supine to Sit: 7: Independent Transfers Transfers: Sit to Stand;Stand to Sit Sit to Stand: 7: Independent Stand to Sit: 7: Independent Ambulation/Gait Ambulation/Gait Assistance: 6: Modified independent (Device/Increase time) Ambulation Distance (Feet): 150 Feet Assistive device: Straight cane Gait Pattern: Step-through pattern;Within Functional Limits Stairs: Yes Stairs Assistance: 6: Modified independent (Device/Increase time) Stair Management Technique: One rail Left;Forwards;With cane Number of Stairs: 3    Exercises     PT Diagnosis:    PT Problem List:   PT Treatment Interventions:     PT Goals Acute Rehab PT Goals Pt will go Sit to Stand: with modified independence PT Goal: Sit to Stand - Progress: Met Pt will go Stand to Sit: with modified independence PT Goal: Stand to Sit - Progress: Met Pt will Stand: with modified independence;3 - 5 min;with no upper extremity support PT Goal: Stand - Progress: Met Pt will Ambulate: 51 - 150 feet;with  modified independence;with cane PT Goal: Ambulate - Progress: Met  Visit Information  Last PT Received On: 07/23/12 Assistance Needed: +1    Subjective Data  Subjective: I would like to go home today   Cognition  Cognition Arousal/Alertness: Awake/alert Behavior During Therapy: WFL for tasks assessed/performed Overall Cognitive Status: Within Functional Limits for tasks assessed    Balance  High Level Balance High Level Balance Comments: able to maintain balance with min-mod perturbations during gait  End of Session PT - End of Session Activity Tolerance: Patient tolerated treatment well Patient left: in bed;with call bell/phone within reach (sitting EOB)   GP     [name], PT [phone] 07/23/2012, 11:56 AM

## 2012-07-23 NOTE — Progress Notes (Signed)
Chaplain responded to spiritual care consult from nurse on pt's behalf - "Pt has requested a chaplain for Easter." Pt appreciated my coming, even if it was late in the day. Pt described minor stroke she had yesterday. Major deficit from stroke is that pt has some difficulty in speaking. Pt is confident her speech will return to normal. Had prayer with pt.

## 2012-07-23 NOTE — Evaluation (Signed)
Occupational Therapy Evaluation Patient Details Name: Traci Carter MRN: [id number] DOB: 1945-06-27 Today's Date: 07/23/2012 Time: 0865-7846 OT Time Calculation (min): 18 min  OT Assessment / Plan / Recommendation Clinical Impression  Pt doing well and only requires set up/sup for safety with ADLs and is independent with functional mobility following hospitalization due to IC hemorrhage/seizure. All education completed and no further acute or folow up OT services needed at this time    OT Assessment  Patient does not need any further OT services    Follow Up Recommendations  No OT follow up    Barriers to Discharge  None    Equipment Recommendations  None recommended by OT    Recommendations for Other Services  None  Frequency       Precautions / Restrictions  None   Pertinent Vitals/Pain     ADL  Grooming: Performed;Wash/dry face;Wash/dry hands;Teeth care;Supervision/safety;Set up Where Assessed - Grooming: Unsupported sitting;Unsupported standing Upper Body Bathing: Performed;Supervision/safety;Set up Lower Body Bathing: Supervision/safety;Set up Upper Body Dressing: Performed;Supervision/safety;Set up Lower Body Dressing: Performed;Supervision/safety;Set up Toilet Transfer: Performed;Independent Toilet Transfer Method: Sit to Barista: Regular height toilet Toileting - Clothing Manipulation and Hygiene: Performed;Supervision/safety Where Assessed - Toileting Clothing Manipulation and Hygiene: Standing    OT Diagnosis:    OT Problem List:   OT Treatment Interventions:     OT Goals    Visit Information  Last OT Received On: 07/23/12 Assistance Needed: +1    Subjective Data  Subjective: " I really need to work with speech therapy too " Patient Stated Goal: To return home   Prior Functioning     Home Living Lives With: Spouse Available Help at Discharge: Available 24 hours/day Type of Home: House Home Access: Stairs to  enter Secretary/administrator of Steps: 3 Entrance Stairs-Rails: Right Bathroom Shower/Tub: Engineer, manufacturing systems: Handicapped height Home Adaptive Equipment: Environmental consultant - rolling;Straight cane Prior Function Level of Independence: Independent Able to Take Stairs?: Yes Driving: Yes Vocation: Retired Musician: Expressive difficulties Dominant Hand: Right         Vision/Perception Vision - History Baseline Vision: Wears glasses all the time Patient Visual Report: No change from baseline Perception Perception: Within Functional Limits   Cognition  Cognition Arousal/Alertness: Awake/alert Behavior During Therapy: WFL for tasks assessed/performed Overall Cognitive Status: Within Functional Limits for tasks assessed    Extremity/Trunk Assessment Right Upper Extremity Assessment RUE ROM/Strength/Tone: WFL for tasks assessed RUE Coordination: WFL - gross/fine motor Left Upper Extremity Assessment LUE ROM/Strength/Tone: WFL for tasks assessed LUE Coordination: WFL - gross/fine motor     Mobility Bed Mobility Bed Mobility: Supine to Sit Supine to Sit: 7: Independent Transfers Sit to Stand: 7: Independent Stand to Sit: 7: Independent     Exercise     Balance Balance Balance Assessed: No High Level Balance High Level Balance Comments: able to maintain balance with min-mod perturbations during gait   End of Session OT - End of Session Equipment Utilized During Treatment: Gait belt Activity Tolerance: Patient tolerated treatment well Patient left: in chair;with call bell/phone within reach  GO     Galen Manila 07/23/2012, 1:02 PM

## 2012-07-23 NOTE — Progress Notes (Signed)
Stroke Team Progress Note  HISTORY Traci Carter is a 67 y.o. female with a history of htn, DM,a fib on coumadin who had a generalized seizure on the morning of 07/21/2012. She intially develped difficulty speaking the night before, but then on 07/21/2012 had seizure activity. She was taken to [facility] where she was given ativan and a CT showed a small left frontal hemorrhage. She was therefore transferred to [facility].   LKW: The evening of 07/20/2012 tpa given: no, hemorrhage  SUBJECTIVE No family members present. The pt feels she is getting better. Still with some aphasia but able to communicate effectively for the most part. She feels strength is back to baseline. Anxious to go home. She has questions regarding further risk of stroke. States she was also on aspirin prior to admission.  OBJECTIVE Most recent Vital Signs: Filed Vitals:   07/22/12 1515 07/22/12 1606 07/22/12 2123 07/23/12 0547  BP: 143/63 128/69 136/66 149/65  Pulse: 70 70 72 71  Temp:  98.1 F (36.7 C) 97.6 F (36.4 C) 97.7 F (36.5 C)  TempSrc:  Oral Oral Oral  Resp: 14 14 16 16   Height:      Weight:      SpO2: 96% 96% 99% 98%   CBG (last 3)   Recent Labs  07/22/12 1214 07/22/12 1609 07/22/12 2125  GLUCAP 130* 153* 98    IV Fluid Intake:   . sodium chloride Stopped (07/22/12 0900)    MEDICATIONS  . citalopram  10 mg Oral Daily  . furosemide  20 mg Oral Daily  . insulin aspart  2-6 Units Subcutaneous TID WC  . levETIRAcetam  1,000 mg Intravenous Q12H  . pantoprazole (PROTONIX) IV  40 mg Intravenous QHS  . senna-docusate  1 tablet Oral BID   PRN:  acetaminophen, acetaminophen, HYDROcodone-acetaminophen  Diet:  Carb Control with thin liquids. Activity:  Up with assistance DVT Prophylaxis:  SCDs  CLINICALLY SIGNIFICANT STUDIES Basic Metabolic Panel:  Recent Labs Lab 07/21/12 1306  NA 139  K 4.2  CL 100  CO2 30  GLUCOSE 176*  BUN 16  CREATININE 0.87  CALCIUM 9.5   Liver Function Tests:   Recent Labs Lab 07/21/12 1306  AST 18  ALT 13  ALKPHOS 67  BILITOT 0.4  PROT 6.9  ALBUMIN 3.6   CBC:  Recent Labs Lab 07/21/12 1306  WBC 9.7  HGB 11.1*  HCT 32.2*  MCV 89.7  PLT 159   Coagulation:  Recent Labs Lab 07/21/12 1306 07/22/12 0430  LABPROT 15.8* 14.9  INR 1.29 1.19   Cardiac Enzymes: No results found for this basename: CKTOTAL, CKMB, CKMBINDEX, TROPONINI,  in the last 168 hours Urinalysis: No results found for this basename: COLORURINE, APPERANCEUR, LABSPEC, PHURINE, GLUCOSEU, HGBUR, BILIRUBINUR, KETONESUR, PROTEINUR, UROBILINOGEN, NITRITE, LEUKOCYTESUR,  in the last 168 hours Lipid Panel    Component Value Date/Time   CHOL 169 07/23/2012 0630   TRIG 69 07/23/2012 0630   HDL 81 07/23/2012 0630   CHOLHDL 2.1 07/23/2012 0630   VLDL 14 07/23/2012 0630   LDLCALC 74 07/23/2012 0630   HgbA1C  No results found for this basename: HGBA1C    Urine Drug Screen:   No results found for this basename: labopia, cocainscrnur, labbenz, amphetmu, thcu, labbarb    Alcohol Level: No results found for this basename: ETH,  in the last 168 hours  Ct Angio Head W/cm &/or Wo Cm  07/22/2012    1.  Stable left frontal lobe hemorrhage. No associated abnormal vascularity.  Estimated hemorrhage volume 9 ml. 2.  Negative intracranial CTA aside from mild atherosclerosis. 3.  No new intracranial abnormality.    Ct Head Wo Contrast 07/22/12 1.  Stable posterior superior left frontal lobe parenchymal hemorrhage.  Stable mild surrounding edema and no significant mass effect. 2.  No new intracranial abnormality.    MRI of the brain  patient has permanent pacemaker.  2D Echocardiogram  not indicated for hemorrhagic CVA; although, this may have been an embolic stroke with hemorrhagic conversion.  Carotid Doppler  Preliminary report: Bilateral: No evidence of hemodynamically significant internal carotid artery stenosis. Vertebral artery flow is antegrade.   CXR    EKG  performed at  [facility] ?  Therapy Recommendations - no followup physical therapy recommended. Continued speech therapy recommended on an outpatient basis.  Physical Exam   General - pleasant 67 year old female with noticeable aphasia. Heart - Regular rate and rhythm - no murmer Lungs - Clear to auscultation Abdomen - Soft - non tender Extremities - Distal pulses intact - no edema Skin - Warm and dry  NEUROLOGIC:   MENTAL STATUS: awake, alert, oriented, follows simple commands. Mild to moderate expressive aphasia. CRANIAL NERVES: pupils equal and reactive to light,extraocular muscles intact, facial sensation and strength symmetric, uvula midlinec, tongue midline MOTOR: normal bulk and tone, Strength -5/5 throughout SENSORY: normal and symmetric to light touch  COORDINATION: finger-nose-finger normal  ASSESSMENT [name] is a 67 y.o. female presenting with seizure activity, right hemiparesis, and aphasia.. Imaging confirmed a posterior superior left frontal lobe parenchymal hemorrhage with mild surrounding edema. On Coumadin and aspirin prior to admission for a history of atrial fibrillation. 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  Patient with resultant aphasia.    History atrial fibrillation with permanent pacemaker  Coumadin prior to admission with subtherapeutic INR at time of stroke  Seizure activity - now on Keppra  Diabetes mellitus  Hospital day # 2  TREATMENT/PLAN  Continue to hold anticoagulation in light of a hemorrhagic CVA. Consider adding NOAC one week after the stroke.  Continue Keppra for seizure prophylaxis.  Goal blood pressure less than 160/90  Outpatient speech therapy recommended.  Check EEG  Delton See PA-C [facility] Pager [phone] 07/23/2012, 8:04 AM  I have personally obtained a history, examined the patient, evaluated imaging results, and formulated the assessment and plan of care. I agree with  the above. Delia Heady, MD

## 2012-07-24 ENCOUNTER — Inpatient Hospital Stay (HOSPITAL_COMMUNITY): Payer: Medicare Other

## 2012-07-24 LAB — GLUCOSE, CAPILLARY
Glucose-Capillary: 196 mg/dL — ABNORMAL HIGH (ref 70–99)
Glucose-Capillary: 158 mg/dL — ABNORMAL HIGH (ref 70–99)
Glucose-Capillary: 212 mg/dL — ABNORMAL HIGH (ref 70–99)
Glucose-Capillary: 206 mg/dL — ABNORMAL HIGH (ref 70–99)

## 2012-07-24 MED ORDER — LEVETIRACETAM 500 MG PO TABS
1000.0000 mg | ORAL_TABLET | Freq: Two times a day (BID) | ORAL | Status: DC
  Administered 2012-07-24 – 2012-07-25 (×3): 1000 mg via ORAL
  Filled 2012-07-24 (×4): qty 2

## 2012-07-24 MED ORDER — HYDRALAZINE HCL 20 MG/ML IJ SOLN
10.0000 mg | INTRAMUSCULAR | Status: DC | PRN
  Administered 2012-07-24: 10 mg via INTRAVENOUS
  Filled 2012-07-24: qty 1

## 2012-07-24 MED ORDER — HYDRALAZINE HCL 20 MG/ML IJ SOLN: 10.0000 mg | INTRAMUSCULAR | Status: DC | PRN

## 2012-07-24 MED ORDER — PANTOPRAZOLE SODIUM 40 MG PO TBEC
40.0000 mg | DELAYED_RELEASE_TABLET | Freq: Every day | ORAL | Status: DC
  Administered 2012-07-24: 40 mg via ORAL
  Filled 2012-07-24: qty 1

## 2012-07-24 MED ORDER — LISINOPRIL 5 MG PO TABS
5.0000 mg | ORAL_TABLET | Freq: Every day | ORAL | Status: DC
  Administered 2012-07-24 – 2012-07-25 (×2): 5 mg via ORAL
  Filled 2012-07-24 (×2): qty 1

## 2012-07-24 NOTE — Procedures (Signed)
History: 67 year old female with hemorrhage who presented with seizures.  Sedation: None  Background: There is a well defined posterior dominant rhythm of 9.5 Hz that attenuates with eye opening. There is mild increased delta associated with drowsiness. There is frequent irregular delta at F7, T7 > F3.  Photic stimulation: Physiologic driving is not performed  EEG Abnormalities: 1) left frontotemporal delta activity  Clinical Interpretation: This abnormal EEG is consistent with a left frontotemporal cerebral dysfunction. This is consistent with the patient's known hemorrhage in that region. There was no seizure or seizure predisposition recorded on this study.   Ritta Slot, MD [facility] [phone]  If 7pm- 7am, please page neurology on call at [phone].

## 2012-07-24 NOTE — Progress Notes (Signed)
Portable EEG completed

## 2012-07-24 NOTE — Progress Notes (Signed)
Pt BP read 175/81 at 1900,Dr Roseanne Reno paged and notified,ordered to give iv labetalol 10mg  once,same given at 2058,BP rechecked at 2200,read 157/79,pt reassured.At 0230,BP rose up to 186/85,Dr Roseanne Reno paged again,ordered to give iv hydralazine 10mg  every 2hrs as needed,given at 0307,pt denies any discomfort,will however continue to monitor. Traci Carter, Traci Carter

## 2012-07-24 NOTE — Care Management Note (Signed)
    Page 1 of 1   07/25/2012     3:17:17 PM   CARE MANAGEMENT NOTE 07/25/2012  Patient:  NICKCOLE, BRALLEY   Account Number:  [id number]  Date Initiated:  07/23/2012  Documentation initiated by:  [facility]  Subjective/Objective Assessment:   admitted with small left frontal hemorrhage, seizure, aphasia     Action/Plan:   PT/OT/ST-recommended outpatient ST   Anticipated DC Date:  07/25/2012   Anticipated DC Plan:  HOME/SELF CARE      DC Planning Services  CM consult  OP Neuro Rehab      Choice offered to / List presented to:             Status of service:  Completed, signed off Medicare Important Message given?   (If response is "NO", the following Medicare IM given date fields will be blank) Date Medicare IM given:   Date Additional Medicare IM given:    Discharge Disposition:  HOME/SELF CARE  Per UR Regulation:  Reviewed for med. necessity/level of care/duration of stay  If discussed at Long Length of Stay Meetings, dates discussed:    Comments:  07/25/12 Faxed signed order and ST notes to [facility] and confirmed receipt with Harriett Sine. Jacquelynn Cree RN, BSN, [facility]   07/24/12 Spoke with patient about outpatient speech therapy. She has had outpatient therapy at [facility] previously and would like to go there for speech therapy. Contacted Harriett Sine at [facility] [phone], need to fax order signed by MD or NP and therapy note to [phone] and they will contact patient to make an appointment. The patient's demographic information is alreday in there system.Contacted Annie Main NP and requested singnature, placed order on front of patient chart.Updated patient. Will follow up on 07/25/12. Jacquelynn Cree RN, BSN

## 2012-07-24 NOTE — Progress Notes (Signed)
Stroke Team Progress Note  HISTORY Traci Carter is a 67 y.o. female with a history of htn, DM,a fib on coumadin who had a generalized seizure on the morning of 07/21/2012. She intially develped difficulty speaking the night before, but then on 07/21/2012 had seizure activity. She was taken to [facility] where she was given ativan and a CT showed a small left frontal hemorrhage. She was therefore transferred to [facility].   LKW: The evening of 07/20/2012 tpa given: no, hemorrhage  SUBJECTIVE No family at bedside. Pt currently undergoing an EEG.  OBJECTIVE Most recent Vital Signs: Filed Vitals:   07/24/12 0233 07/24/12 0410 07/24/12 0600 07/24/12 1000  BP: 186/85 158/72 151/73 156/99  Pulse: 73 84 80 75  Temp:   98.4 F (36.9 C) 99 F (37.2 C)  TempSrc:    Oral  Resp: 18  18 17   Height:      Weight:      SpO2:   95% 98%   CBG (last 3)   Recent Labs  07/23/12 1612 07/23/12 2141 07/24/12 0658  GLUCAP 199* 153* 196*   IV Fluid Intake:   . sodium chloride Stopped (07/22/12 0900)   MEDICATIONS  . citalopram  10 mg Oral Daily  . furosemide  20 mg Oral Daily  . insulin aspart  2-6 Units Subcutaneous TID WC  . levETIRAcetam  1,000 mg Intravenous Q12H  . pantoprazole (PROTONIX) IV  40 mg Intravenous QHS  . senna-docusate  1 tablet Oral BID   PRN:  acetaminophen, acetaminophen, hydrALAZINE, HYDROcodone-acetaminophen  Diet:  Carb Control with thin liquids. Activity:  Up with assistance DVT Prophylaxis:  SCDs  CLINICALLY SIGNIFICANT STUDIES Basic Metabolic Panel:   Recent Labs Lab 07/21/12 1306  NA 139  K 4.2  CL 100  CO2 30  GLUCOSE 176*  BUN 16  CREATININE 0.87  CALCIUM 9.5   Liver Function Tests:   Recent Labs Lab 07/21/12 1306  AST 18  ALT 13  ALKPHOS 67  BILITOT 0.4  PROT 6.9  ALBUMIN 3.6   CBC:   Recent Labs Lab 07/21/12 1306  WBC 9.7  HGB 11.1*  HCT 32.2*  MCV 89.7  PLT 159   Coagulation:   Recent Labs Lab 07/21/12 1306 07/22/12 0430   LABPROT 15.8* 14.9  INR 1.29 1.19   Cardiac Enzymes: No results found for this basename: CKTOTAL, CKMB, CKMBINDEX, TROPONINI,  in the last 168 hours Urinalysis: No results found for this basename: COLORURINE, APPERANCEUR, LABSPEC, PHURINE, GLUCOSEU, HGBUR, BILIRUBINUR, KETONESUR, PROTEINUR, UROBILINOGEN, NITRITE, LEUKOCYTESUR,  in the last 168 hours Lipid Panel    Component Value Date/Time   CHOL 169 07/23/2012 0630   TRIG 69 07/23/2012 0630   HDL 81 07/23/2012 0630   CHOLHDL 2.1 07/23/2012 0630   VLDL 14 07/23/2012 0630   LDLCALC 74 07/23/2012 0630   HgbA1C  No results found for this basename: HGBA1C    Urine Drug Screen:   No results found for this basename: labopia,  cocainscrnur,  labbenz,  amphetmu,  thcu,  labbarb    Alcohol Level: No results found for this basename: ETH,  in the last 168 hours  CT Head  07/22/12 1.  Stable posterior superior left frontal lobe parenchymal hemorrhage.  Stable mild surrounding edema and no significant mass effect. 2.  No new intracranial abnormality.    CT Angio Head  07/22/2012   1.  Stable left frontal lobe hemorrhage. No associated abnormal vascularity. Estimated hemorrhage volume 9 ml. 2.  Negative intracranial CTA  aside from mild atherosclerosis. 3.  No new  intracranial abnormality.    MRI of the brain  patient has permanent pacemaker.  2D Echocardiogram  not indicated for hemorrhagic CVA; although, this may have been an embolic stroke with hemorrhagic conversion.  Carotid Doppler  Preliminary report: Bilateral: No evidence of hemodynamically significant internal carotid artery stenosis. Vertebral artery flow is antegrade.   CXR    EEG    Therapy Recommendations - no followup physical therapy recommended. Continued speech therapy recommended on an outpatient basis.  Physical Exam   General - pleasant 67 year old female with noticeable aphasia. Heart - Regular rate and rhythm - no murmer Lungs - Clear to auscultation Abdomen - Soft -  non tender Extremities - Distal pulses intact - no edema Skin - Warm and dry  NEUROLOGIC:  MENTAL STATUS: awake, alert, oriented, follows simple commands. Mild to moderate expressive aphasia. CRANIAL NERVES: pupils equal and reactive to light,extraocular muscles intact, facial sensation and strength symmetric, uvula midlinec, tongue midline MOTOR: normal bulk and tone, Strength -5/5 throughout SENSORY: normal and symmetric to light touch  COORDINATION: finger-nose-finger normal  ASSESSMENT [name] is a 67 y.o. female presenting with seizure activity, right hemiparesis, and aphasia.. Imaging confirmed a posterior superior left frontal lobe parenchymal hemorrhage with mild surrounding edema. 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 On Coumadin and aspirin prior to admission for a history of atrial fibrillation, INR was subtherapeutic on arrival. 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  Patient with resultant aphasia.    History atrial fibrillation with permanent pacemaker  Coumadin prior to admission with subtherapeutic INR at time of stroke Hypertension - IV labetolol and hydralazine during the night, max 192/69  Seizure activity - now on Keppra  Diabetes mellitus Hyperlipidemia, LDL 74, on statin PTA, on statin now, goal LDL < 100  Hospital day # 3  TREATMENT/PLAN  Continue to hold anticoagulation in light of a hemorrhagic CVA. Consider adding NOAC one week after the stroke.  Continue Keppra for seizure prophylaxis.  Change SBP goal to < 180  Add lisinopril 5 mg daily  Outpatient speech therapy recommended.  F/u  EEG  Annie Main, MSN, RN, ANVP-BC, ANP-BC, GNP-BC [facility] Pager: [phone] 07/24/2012 10:58 AM  I have personally obtained a history, examined the patient, evaluated imaging results, and formulated the assessment and plan of care. I agree with the above.  Delia Heady, MD

## 2012-07-24 NOTE — Progress Notes (Signed)
Inpatient Diabetes Program Recommendations  AACE/ADA: New Consensus Statement on Inpatient Glycemic Control (2013)  Target Ranges:  Prepandial:   less than 140 mg/dL      Peak postprandial:   less than 180 mg/dL (1-2 hours)      Critically ill patients:  140 - 180 mg/dL  Inpatient Diabetes Program Recommendations Correction (SSI): start MODERATE scale TID+HS DISCONTINUE ICU HYPERGLYCEMIA PROTOCOL Will follow. Thank you  Piedad Climes BSN, RN,CDE Inpatient Diabetes Coordinator [phone] (team pager)

## 2012-07-25 MED ORDER — CITALOPRAM HYDROBROMIDE 10 MG PO TABS: 20.0000 mg | ORAL_TABLET | Freq: Every day | ORAL | Status: DC

## 2012-07-25 MED ORDER — LISINOPRIL 5 MG PO TABS: 5.0000 mg | ORAL_TABLET | Freq: Every day | ORAL | Status: DC

## 2012-07-25 MED ORDER — APIXABAN 5 MG PO TABS: 5.0000 mg | ORAL_TABLET | Freq: Two times a day (BID) | ORAL | Status: DC

## 2012-07-25 MED ORDER — FUROSEMIDE 20 MG PO TABS: 20.0000 mg | ORAL_TABLET | Freq: Every day | ORAL | Status: DC

## 2012-07-25 MED ORDER — LEVETIRACETAM 1000 MG PO TABS: 1000.0000 mg | ORAL_TABLET | Freq: Two times a day (BID) | ORAL | Status: DC

## 2012-07-25 NOTE — Discharge Summary (Signed)
Stroke Discharge Summary  Patient ID: Traci Carter   MRN: [id number]      DOB: 07-03-1945  Date of Admission: 07/21/2012 Date of Discharge: 07/25/2012  Attending Physician:  Darcella Cheshire, MD, Stroke MD  Consulting Physician(s):      Patient's PCP:  No primary provider on file.  Discharge Diagnoses:  Active Problems:   ICH (intracerebral hemorrhage)   Diabetes   Unspecified essential hypertension   Seizure   Obesity  BMI: Body mass index is 41.52 kg/(m^2).  Past Medical History  Diagnosis Date  . Hypertension   . Diabetes mellitus without complication   . Dysrhythmia   . Afib    Past Surgical History  Procedure Laterality Date  . Insert / replace / remove pacemaker        Medication List    STOP taking these medications       warfarin 2.5 MG tablet  Commonly known as:  COUMADIN      TAKE these medications       apixaban 5 MG Tabs tablet  Commonly known as:  ELIQUIS  Take 1 tablet (5 mg total) by mouth 2 (two) times daily.  Start taking on:  07/28/2012     atorvastatin 20 MG tablet  Commonly known as:  LIPITOR  Take 20 mg by mouth daily.     citalopram 10 MG tablet  Commonly known as:  CELEXA  Take 2 tablets (20 mg total) by mouth daily.     furosemide 20 MG tablet  Commonly known as:  LASIX  Take 1 tablet (20 mg total) by mouth daily.     levETIRAcetam 1000 MG tablet  Commonly known as:  KEPPRA  Take 1 tablet (1,000 mg total) by mouth 2 (two) times daily.     lisinopril 5 MG tablet  Commonly known as:  PRINIVIL,ZESTRIL  Take 1 tablet (5 mg total) by mouth daily.     zolpidem 10 MG tablet  Commonly known as:  AMBIEN  Take 10 mg by mouth at bedtime.        LABORATORY STUDIES CBC    Component Value Date/Time   WBC 9.7 07/21/2012 1306   RBC 3.59* 07/21/2012 1306   HGB 11.1* 07/21/2012 1306   HCT 32.2* 07/21/2012 1306   PLT 159 07/21/2012 1306   MCV 89.7 07/21/2012 1306   MCH 30.9 07/21/2012 1306   MCHC 34.5 07/21/2012 1306   RDW 13.2  07/21/2012 1306   CMP    Component Value Date/Time   NA 139 07/21/2012 1306   K 4.2 07/21/2012 1306   CL 100 07/21/2012 1306   CO2 30 07/21/2012 1306   GLUCOSE 176* 07/21/2012 1306   BUN 16 07/21/2012 1306   CREATININE 0.87 07/21/2012 1306   CALCIUM 9.5 07/21/2012 1306   PROT 6.9 07/21/2012 1306   ALBUMIN 3.6 07/21/2012 1306   AST 18 07/21/2012 1306   ALT 13 07/21/2012 1306   ALKPHOS 67 07/21/2012 1306   BILITOT 0.4 07/21/2012 1306   GFRNONAA 69* 07/21/2012 1306   GFRAA 80* 07/21/2012 1306   COAGS Lab Results  Component Value Date   INR 1.19 07/22/2012   INR 1.29 07/21/2012   Lipid Panel    Component Value Date/Time   CHOL 169 07/23/2012 0630   TRIG 69 07/23/2012 0630   HDL 81 07/23/2012 0630   CHOLHDL 2.1 07/23/2012 0630   VLDL 14 07/23/2012 0630   LDLCALC 74 07/23/2012 0630   HgbA1C  No results found  for this basename: HGBA1C   Cardiac Panel (last 3 results) No results found for this basename: CKTOTAL, CKMB, TROPONINI, RELINDX,  in the last 72 hours Urinalysis No results found for this basename: colorurine, appearanceur, labspec, phurine, glucoseu, hgbur, bilirubinur, ketonesur, proteinur, urobilinogen, nitrite, leukocytesur   Urine Drug Screen  No results found for this basename: labopia, cocainscrnur, labbenz, amphetmu, thcu, labbarb    Alcohol Level No results found for this basename: eth    SIGNIFICANT DIAGNOSTIC STUDIES CT Head 07/22/12 1. Stable posterior superior left frontal lobe parenchymal hemorrhage. Stable mild surrounding edema and no significant mass effect. 2. No new intracranial abnormality.  CT Angio Head 07/22/2012 1. Stable left frontal lobe hemorrhage. No associated abnormal vascularity. Estimated hemorrhage volume 9 ml. 2. Negative intracranial CTA aside from mild atherosclerosis. 3. No new intracranial abnormality.  MRI of the brain patient has permanent pacemaker.  2D Echocardiogram not indicated for hemorrhagic CVA; although, this may have been an embolic stroke  with hemorrhagic conversion.  Carotid Doppler Preliminary report: Bilateral: No evidence of hemodynamically significant internal carotid artery stenosis. Vertebral artery flow is antegrade.  EEG This abnormal EEG is consistent with a left frontotemporal cerebral dysfunction. This is consistent with the patient's known hemorrhage in that region. There was no seizure or seizure predisposition recorded on this study.      History of Present Illness   Traci Carter is a 67 y.o. female with a history of htn, DM,a fib on coumadin who had a generalized seizure on the morning of 07/21/2012. She intially develped difficulty speaking the night before, but then on 07/21/2012 had seizure activity. She was taken to [facility] where she was given ativan and a CT showed a small left frontal hemorrhage. She was therefore transferred to [facility]   Hospital Course  Traci Carter is a 67 y.o. female presenting with seizure activity, right hemiparesis, and aphasia. Imaging confirmed a posterior superior left frontal lobe parenchymal hemorrhage with mild surrounding edema. 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 On Coumadin and aspirin prior to admission for a history of atrial fibrillation, INR was subtherapeutic on arrival. 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 Patient with resultant aphasia which is improving.  History atrial fibrillation with permanent pacemaker  Coumadin prior to admission with subtherapeutic INR at time of stroke Hypertension - IV labetolol and hydralazine. Lisinopril added with improvement overnight. Seizure activity - now on Keppra  Diabetes mellitus Hyperlipidemia, LDL 74, on statin PTA, on statin now, goal LDL < 100   Patient with resultant aphasia which is improving. Physical therapy, occupational therapy and speech therapy evaluated patient. They recommend speech therapy only as outpatient.  Discharge Exam  Blood pressure 131/73, pulse 71,  temperature 98.8 F (37.1 C), temperature source Oral, resp. rate 20, height 5\' 2"  (1.575 m), weight 103 kg (227 lb 1.2 oz), SpO2 98.00%.   General - pleasant 67 year old female with noticeable aphasia.  Heart - Regular rate and rhythm - no murmer  Lungs - Clear to auscultation  Abdomen - Soft - non tender  Extremities - Distal pulses intact - no edema  Skin - Warm and dry  NEUROLOGIC:  MENTAL STATUS: awake, alert, oriented, follows simple commands. Mild to moderate expressive aphasia.  CRANIAL NERVES: pupils equal and reactive to light,extraocular muscles intact, facial sensation and strength symmetric, uvula midlinec, tongue midline  MOTOR: normal bulk and tone, Strength -5/5 throughout  SENSORY: normal and symmetric to light touch  COORDINATION: finger-nose-finger normal  Discharge Diet   Carb Control thin liquids  Discharge Plan    Disposition:  home   eliquis , resume on Saturday 07/28/2012  Ongoing risk factor control by Primary Care Physician. Risk factor recommendations:  Hypertension target range 130-140/70-80 Lipid range - LDL < 100 and checked every 6 months, fasting Diabetes - HgB A1C <7   Follow-up with primary medical provider within  1 month.  Follow-up with Dr. Delia Heady in 2 months.  35 minutes were spent preparing discharge.  Signed Gwendolyn Lima. Manson Passey, [facility], MBA, MHA [facility] Pager: [phone] 07/25/2012 1:31 PM  I have personally obtained a history, examined the patient, evaluated imaging results, and formulated the assessment and plan of care. I agree with the above.  Delia Heady, MD

## 2012-07-25 NOTE — Progress Notes (Signed)
Stroke Team Progress Note  HISTORY Traci Carter is a 67 y.o. female with a history of htn, DM,a fib on coumadin who had a generalized seizure on the morning of 07/21/2012. She intially develped difficulty speaking the night before, but then on 07/21/2012 had seizure activity. She was taken to [facility] where she was given ativan and a CT showed a small left frontal hemorrhage. She was therefore transferred to [facility].   LKW: The evening of 07/20/2012 tpa given: no, hemorrhage  SUBJECTIVE Patient lying in bed. No questions, comments.  OBJECTIVE Most recent Vital Signs: Filed Vitals:   07/25/12 0200 07/25/12 0600 07/25/12 0658 07/25/12 0901  BP: 157/85 187/86 142/72 135/68  Pulse: 71 81 84 70  Temp: 98 F (36.7 C) 97.9 F (36.6 C)  98.3 F (36.8 C)  TempSrc:    Oral  Resp: 18 18  18   Height:      Weight:      SpO2: 97% 97%  97%   CBG (last 3)   Recent Labs  07/24/12 1614 07/24/12 2138 07/25/12 0642  GLUCAP 212* 206* 173*   IV Fluid Intake:   . sodium chloride Stopped (07/22/12 0900)   MEDICATIONS  . citalopram  10 mg Oral Daily  . furosemide  20 mg Oral Daily  . levETIRAcetam  1,000 mg Oral BID  . lisinopril  5 mg Oral Daily  . pantoprazole  40 mg Oral QHS  . senna-docusate  1 tablet Oral BID   PRN:  acetaminophen, acetaminophen, hydrALAZINE, HYDROcodone-acetaminophen  Diet:  Carb Control with thin liquids. Activity:  Up with assistance DVT Prophylaxis:  SCDs  CLINICALLY SIGNIFICANT STUDIES Basic Metabolic Panel:   Recent Labs Lab 07/21/12 1306  NA 139  K 4.2  CL 100  CO2 30  GLUCOSE 176*  BUN 16  CREATININE 0.87  CALCIUM 9.5   Liver Function Tests:   Recent Labs Lab 07/21/12 1306  AST 18  ALT 13  ALKPHOS 67  BILITOT 0.4  PROT 6.9  ALBUMIN 3.6   CBC:   Recent Labs Lab 07/21/12 1306  WBC 9.7  HGB 11.1*  HCT 32.2*  MCV 89.7  PLT 159   Coagulation:   Recent Labs Lab 07/21/12 1306 07/22/12 0430  LABPROT 15.8* 14.9  INR 1.29 1.19    Cardiac Enzymes: No results found for this basename: CKTOTAL, CKMB, CKMBINDEX, TROPONINI,  in the last 168 hours Urinalysis: No results found for this basename: COLORURINE, APPERANCEUR, LABSPEC, PHURINE, GLUCOSEU, HGBUR, BILIRUBINUR, KETONESUR, PROTEINUR, UROBILINOGEN, NITRITE, LEUKOCYTESUR,  in the last 168 hours Lipid Panel    Component Value Date/Time   CHOL 169 07/23/2012 0630   TRIG 69 07/23/2012 0630   HDL 81 07/23/2012 0630   CHOLHDL 2.1 07/23/2012 0630   VLDL 14 07/23/2012 0630   LDLCALC 74 07/23/2012 0630   HgbA1C  No results found for this basename: HGBA1C    Urine Drug Screen:   No results found for this basename: labopia,  cocainscrnur,  labbenz,  amphetmu,  thcu,  labbarb    Alcohol Level: No results found for this basename: ETH,  in the last 168 hours  CT Head  07/22/12 1.  Stable posterior superior left frontal lobe parenchymal hemorrhage.  Stable mild surrounding edema and no significant mass effect. 2.  No new intracranial abnormality.    CT Angio Head  07/22/2012   1.  Stable left frontal lobe hemorrhage. No associated abnormal vascularity. Estimated hemorrhage volume 9 ml. 2.  Negative intracranial CTA aside from mild  atherosclerosis. 3.  No new  intracranial abnormality.    MRI of the brain  patient has permanent pacemaker.  2D Echocardiogram  not indicated for hemorrhagic CVA; although, this may have been an embolic stroke with hemorrhagic conversion.  Carotid Doppler  Preliminary report: Bilateral: No evidence of hemodynamically significant internal carotid artery stenosis. Vertebral artery flow is antegrade.   CXR    EEG  This abnormal EEG is consistent with a left frontotemporal cerebral dysfunction. This is consistent with the patient's known hemorrhage in that region. There was no seizure or seizure predisposition recorded on this study.   Therapy Recommendations - no followup physical therapy recommended. Continued speech therapy recommended on an outpatient  basis.  Physical Exam   General - pleasant 67 year old female with noticeable aphasia. Heart - Regular rate and rhythm - no murmer Lungs - Clear to auscultation Abdomen - Soft - non tender Extremities - Distal pulses intact - no edema Skin - Warm and dry  NEUROLOGIC:  MENTAL STATUS: awake, alert, oriented, follows simple commands. Mild to moderate expressive aphasia. CRANIAL NERVES: pupils equal and reactive to light,extraocular muscles intact, facial sensation and strength symmetric, uvula midlinec, tongue midline MOTOR: normal bulk and tone, Strength -5/5 throughout SENSORY: normal and symmetric to light touch  COORDINATION: finger-nose-finger normal  ASSESSMENT Traci Carter is a 67 y.o. female presenting with seizure activity, right hemiparesis, and aphasia.. Imaging confirmed a posterior superior left frontal lobe parenchymal hemorrhage with mild surrounding edema. 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 On Coumadin and aspirin prior to admission for a history of atrial fibrillation, INR was subtherapeutic on arrival. 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  Patient with resultant aphasia which is improving.   History atrial fibrillation with permanent pacemaker  Coumadin prior to admission with subtherapeutic INR at time of stroke Hypertension - IV labetolol and hydralazine. Lisinopril added with improvement overnight.  Seizure activity - now on Keppra  Diabetes mellitus Hyperlipidemia, LDL 74, on statin PTA, on statin now, goal LDL < 100  Hospital day # 4  TREATMENT/PLAN  Continue to hold anticoagulation in light of a hemorrhagic CVA. Add NOAC one week after the stroke (Sat 07/28/2012 start Eliquis). - pt made aware she will likely need prior to approval for the NOAC. Asked her to fill Rx at discharge so we would have time to get approval prior to needing the medication on Sat.  Continue Keppra for seizure  prophylaxis.  Outpatient speech therapy   Will discharge home today  Follow up Dr. Pearlean Brownie in 2 months.  Annie Main, MSN, RN, ANVP-BC, ANP-BC, [facility] Pager: [phone] 07/25/2012 10:43 AM  I have personally obtained a history, examined the patient, evaluated imaging results, and formulated the assessment and plan of care. I agree with the above. Delia Heady, MD

## 2012-07-25 NOTE — Consult Note (Cosign Needed)
Traci Luse EdD 

## 2012-07-25 NOTE — Progress Notes (Signed)
Inpatient Diabetes Program Recommendations  AACE/ADA: New Consensus Statement on Inpatient Glycemic Control (2013)  Target Ranges:  Prepandial:   less than 140 mg/dL      Peak postprandial:   less than 180 mg/dL (1-2 hours)      Critically ill patients:  140 - 180 mg/dL  Results for Traci Carter, Traci Carter (MRN [id number]) as of 07/25/2012 11:52  Ref. Range 07/24/2012 06:58 07/24/2012 11:42 07/24/2012 16:14 07/24/2012 21:38 07/25/2012 06:42  Glucose-Capillary Latest Range: 70-99 mg/dL 086 (H) 578 (H) 469 (H) 206 (H) 173 (H)   Inpatient Diabetes Program Recommendations Correction (SSI): start MODERATE scale TID+HS Thank you  Piedad Climes BSN, RN,CDE Inpatient Diabetes Coordinator [phone] (team pager)

## 2012-08-02 ENCOUNTER — Ambulatory Visit: Payer: Self-pay | Admitting: Cardiology

## 2012-08-02 DIAGNOSIS — I10 Essential (primary) hypertension: Secondary | ICD-10-CM

## 2012-08-02 LAB — BASIC METABOLIC PANEL
Anion Gap: 4 — ABNORMAL LOW (ref 7–16)
Potassium: 3.6 mmol/L (ref 3.5–5.1)
Sodium: 138 mmol/L (ref 136–145)

## 2012-08-02 LAB — CBC WITH DIFFERENTIAL/PLATELET
Basophil #: 0.1 10*3/uL (ref 0.0–0.1)
Eosinophil #: 0.3 10*3/uL (ref 0.0–0.7)
Lymphocyte #: 3.2 10*3/uL (ref 1.0–3.6)
MCH: 31.3 pg (ref 26.0–34.0)
Monocyte #: 0.9 x10 3/mm (ref 0.2–0.9)
Neutrophil #: 5.5 10*3/uL (ref 1.4–6.5)
Neutrophil %: 54.6 %
Platelet: 270 10*3/uL (ref 150–440)
RBC: 3.98 10*6/uL (ref 3.80–5.20)
RDW: 13.1 % (ref 11.5–14.5)

## 2012-08-02 LAB — APTT: Activated PTT: 39.4 secs — ABNORMAL HIGH (ref 23.6–35.9)

## 2012-08-02 LAB — PROTIME-INR: Prothrombin Time: 14.3 secs (ref 11.5–14.7)

## 2012-08-08 ENCOUNTER — Ambulatory Visit: Payer: Self-pay | Admitting: Cardiology

## 2012-08-08 LAB — PROTIME-INR
INR: 1
Prothrombin Time: 12.9 secs (ref 11.5–14.7)

## 2012-08-15 ENCOUNTER — Encounter: Payer: Self-pay | Admitting: *Deleted

## 2012-08-15 ENCOUNTER — Telehealth: Payer: Self-pay | Admitting: Neurology

## 2012-08-20 ENCOUNTER — Telehealth: Payer: Self-pay | Admitting: Neurology

## 2012-08-24 NOTE — Telephone Encounter (Signed)
done

## 2012-10-18 ENCOUNTER — Encounter: Payer: Self-pay | Admitting: Nurse Practitioner

## 2012-10-18 ENCOUNTER — Ambulatory Visit (INDEPENDENT_AMBULATORY_CARE_PROVIDER_SITE_OTHER): Payer: Medicare Other | Admitting: Nurse Practitioner

## 2012-10-18 VITALS — BP 146/88 | HR 90 | Temp 99.1°F | Ht 64.0 in | Wt 220.0 lb

## 2012-10-18 DIAGNOSIS — E119 Type 2 diabetes mellitus without complications: Secondary | ICD-10-CM

## 2012-10-18 DIAGNOSIS — I619 Nontraumatic intracerebral hemorrhage, unspecified: Secondary | ICD-10-CM

## 2012-10-18 DIAGNOSIS — E785 Hyperlipidemia, unspecified: Secondary | ICD-10-CM

## 2012-10-18 DIAGNOSIS — R569 Unspecified convulsions: Secondary | ICD-10-CM

## 2012-10-18 MED ORDER — CITALOPRAM HYDROBROMIDE 10 MG PO TABS: 20.0000 mg | ORAL_TABLET | Freq: Every day | ORAL | Status: DC

## 2012-10-18 MED ORDER — APIXABAN 5 MG PO TABS: 5.0000 mg | ORAL_TABLET | Freq: Two times a day (BID) | ORAL | Status: DC

## 2012-10-18 MED ORDER — LEVETIRACETAM 1000 MG PO TABS: 1000.0000 mg | ORAL_TABLET | Freq: Two times a day (BID) | ORAL | Status: DC

## 2012-10-18 MED ORDER — LISINOPRIL 5 MG PO TABS: 5.0000 mg | ORAL_TABLET | Freq: Every day | ORAL | Status: DC

## 2012-10-18 MED ORDER — FUROSEMIDE 20 MG PO TABS: 20.0000 mg | ORAL_TABLET | Freq: Every day | ORAL | Status: DC

## 2012-10-18 NOTE — Patient Instructions (Addendum)
PLAN: Continue Eliquis for secondary stroke prevention.  Maintain strict control of hypertension with blood pressure goal below 130/90, diabetes with hemoglobin A1c goal below 6.5% and lipids with LDL cholesterol goal below 100 mg/dL.   Followup in the future with me in 3 months  STROKE/TIA INSTRUCTIONS SMOKING Cigarette smoking nearly doubles your risk of having a stroke & is the single most alterable risk factor  If you smoke or have smoked in the last 12 months, you are advised to quit smoking for your health.  Most of the excess cardiovascular risk related to smoking disappears within a year of stopping.  Ask you doctor about anti-smoking medications  [place] Quit Line: 1-800-QUIT NOW  Free Smoking Cessation Classes ([phone]  CHOLESTEROL Know your levels; limit fat & cholesterol in your diet  Lab Results  Component Value Date   CHOL 169 07/23/2012   HDL 81 07/23/2012   LDLCALC 74 07/23/2012   TRIG 69 07/23/2012   CHOLHDL 2.1 07/23/2012      Many patients benefit from treatment even if their cholesterol is at goal.  Goal: Total Cholesterol less than 160  Goal:  LDL less than 100  Goal:  HDL greater than 40  Goal:  Triglycerides less than 150  BLOOD PRESSURE American Stroke Association blood pressure target is less that 120/80 mm/Hg  Your discharge blood pressure is:  BP: 146/88 mmHg  Monitor your blood pressure  Limit your salt and alcohol intake  Many individuals will require more than one medication for high blood pressure  DIABETES (A1c is a blood sugar average for last 3 months) Goal A1c is under 7% (A1c is blood sugar average for last 3 months)  Diabetes: Diagnosis of diabetes:  A1c was not drawn this admission    No results found for this basename: HGBA1C    Your A1c can be lowered with medications, healthy diet, and exercise.  Check your blood sugar as directed by your physician  Call your physician if you experience unexplained or low blood sugars.  PHYSICAL  ACTIVITY/REHABILITATION Goal is 30 minutes at least 4 days per week    Activity decreases your risk of heart attack and stroke and makes your heart stronger.  It helps control your weight and blood pressure; helps you relax and can improve your mood.  Participate in a regular exercise program.  Talk with your doctor about the best form of exercise for you (dancing, walking, swimming, cycling).  DIET/WEIGHT Goal is to maintain a healthy weight  Your height is:  Height: 5\' 4"  (162.6 cm) Your current weight is: Weight: 220 lb (99.791 kg) Your body Mass Index (BMI) is:  BMI (Calculated): 37.8  Following the type of diet specifically designed for you will help prevent another stroke.  Your goal Body Mass Index (BMI) is 19-24.  Healthy food habits can help reduce 3 risk factors for stroke:  High cholesterol, hypertension, and excess weight.

## 2012-10-18 NOTE — Progress Notes (Signed)
[facility]  PATIENT: Traci Carter DOB: 1946-02-10   HISTORY FROM: patient REASON FOR VISIT: follow up   HISTORICAL  CHIEF COMPLAINT:  Chief Complaint  Patient presents with  . Follow-up    Stroke  - 07/21/2012    HISTORY OF PRESENT ILLNESS: Traci Carter is a 67 y.o. female with a history of htn, DM,a fib on coumadin who had a generalized seizure on the morning of 07/21/2012. She intially develped difficulty speaking the night before, but then on 07/21/2012 had seizure activity. She was taken to [facility] where she was given ativan and a CT showed a small left frontal hemorrhage. She was therefore transferred to [facility].   Imaging confirmed a posterior superior left frontal lobe parenchymal hemorrhage with mild surrounding edema. 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 On Coumadin and aspirin prior to admission for a history of atrial fibrillation, INR was subtherapeutic on arrival. 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 Patient with resultant aphasia which is improving.  Vascular risk factors include:  History atrial fibrillation with permanent pacemaker; Coumadin prior to admission with subtherapeutic INR at time of stroke; Hypertension; Diabetes mellitus; Hyperlipidemia, LDL 74, on statin PTA, on statin now, goal LDL < 100.  Seizure activity - now on Keppra.  Update 10/18/12:  Patient returns for follow up for stroke on 07/21/12.  She reports that she is doing well, aphasia has resolved and she has had no seizures, on Keppra.  She is doing well on Eliquis, with no side effects or excessive bleeding or bruising.  Her LDL was 74, blood sugars average 130 in the mornings, on Glipizide.  She notices she has a shorter temper since the stroke and sometimes feels very irritable. She is taking Celexa, but she does not think it is helping.  She is taking care of her 28 month old granddaughter, which she loves, and she thinks it helps her  focus on something besides her health problems.  REVIEW OF SYSTEMS: Full 14 system review of systems performed and notable only for:  Constitutional: fatigue  Cardiovascular: N/A  Ear/Nose/Throat: N/A  Skin: itching  Eyes: N/A  Respiratory: N/A  Gastroitestinal: N/A  Hematology/Lymphatic: easy bruising, easy bleeding Endocrine: N/A Musculoskeletal:  cramps  Allergy/Immunology: allergies, runny nose  Neurological: headache, numbness Psychiatric: anxiety, decreased energy, racing thoughts Sleep: Insomnia   ALLERGIES: Allergies  Allergen Reactions  . Codeine Other (See Comments)    GI Distress  . Lanoxin (Digoxin)   . Penicillins Rash    HOME MEDICATIONS: Outpatient Prescriptions Prior to Visit  Medication Sig Dispense Refill  . atorvastatin (LIPITOR) 20 MG tablet Take 20 mg by mouth daily.      Marland Kitchen zolpidem (AMBIEN) 10 MG tablet Take 10 mg by mouth at bedtime.      Marland Kitchen apixaban (ELIQUIS) 5 MG TABS tablet Take 1 tablet (5 mg total) by mouth 2 (two) times daily.  30 tablet  2  . citalopram (CELEXA) 10 MG tablet Take 2 tablets (20 mg total) by mouth daily.  30 tablet  2  . furosemide (LASIX) 20 MG tablet Take 1 tablet (20 mg total) by mouth daily.  30 tablet  2  . levETIRAcetam (KEPPRA) 1000 MG tablet Take 1 tablet (1,000 mg total) by mouth 2 (two) times daily.  30 tablet  2  . lisinopril (PRINIVIL,ZESTRIL) 5 MG tablet Take 1 tablet (5 mg total) by mouth daily.  30 tablet  2   No facility-administered medications  prior to visit.    PAST MEDICAL HISTORY: Past Medical History  Diagnosis Date  . Hypertension   . Diabetes mellitus without complication   . Dysrhythmia   . Afib   . Stroke 07/21/12    posterior superior left frontal lobe parenchymal hemorrhage     PAST SURGICAL HISTORY: Past Surgical History  Procedure Laterality Date  . Insert / replace / remove pacemaker      FAMILY HISTORY: No family history on file.  SOCIAL HISTORY: History   Social History  .  Marital Status: Married    Spouse Name: N/A    Number of Children: N/A  . Years of Education: N/A   Occupational History  . Not on file.   Social History Main Topics  . Smoking status: Never Smoker   . Smokeless tobacco: Never Used  . Alcohol Use: 0.6 oz/week    1 Cans of beer per week  . Drug Use: No  . Sexually Active: Not on file   Other Topics Concern  . Not on file   Social History Narrative  . No narrative on file     PHYSICAL EXAM  Filed Vitals:   10/18/12 1419  BP: 146/88  Pulse: 90  Temp: 99.1 F (37.3 C)  TempSrc: Oral  Height: 5\' 4"  (1.626 m)  Weight: 220 lb (99.791 kg)   Body mass index is 37.74 kg/(m^2).  Generalized: In no acute distress, pleasant Caucasian female.  Neck: Supple, no carotid bruits   Cardiac: Iregular rate/rhythm, no murmur   Pulmonary: Clear to auscultation bilaterally   Musculoskeletal: No deformity   Neurological examination   Mentation: Alert oriented to time, place, history taking, language fluent, and causual conversation  Cranial nerve II-XII: Pupils were equal round reactive to light extraocular movements were full, visual field were full on confrontational test. facial sensation normal, mild right lower facial weakness, hearing was intact to finger rubbing bilaterally. Uvula tongue midline. head turning and shoulder shrug and were normal and symmetric.Tongue protrusion into cheek strength was normal. MOTOR: normal bulk and tone, full strength in the BUE, BLE, fine finger movements normal, no pronator drift SENSORY: normal and symmetric to light touch, pinprick, temperature, vibration and proprioception COORDINATION: finger-nose-finger, heel-to-shin bilaterally, there was no truncal ataxia REFLEXES: Brachioradialis 2/2, biceps 2/2, triceps 2/2, patellar 2/2, Achilles 2/2, plantar responses were flexor bilaterally. GAIT/STATION: Rising up from seated position without assistance, normal stance, without trunk ataxia, moderate  stride, good arm swing, smooth turning, unable to perform tiptoe, and heel walking due to knee pain, tandem unsteady.  DIAGNOSTIC DATA (LABS, IMAGING, TESTING) - I reviewed patient records, labs, notes, testing and imaging myself where available.  Lab Results  Component Value Date   WBC 9.7 07/21/2012   HGB 11.1* 07/21/2012   HCT 32.2* 07/21/2012   MCV 89.7 07/21/2012   PLT 159 07/21/2012      Component Value Date/Time   NA 139 07/21/2012 1306   K 4.2 07/21/2012 1306   CL 100 07/21/2012 1306   CO2 30 07/21/2012 1306   GLUCOSE 176* 07/21/2012 1306   BUN 16 07/21/2012 1306   CREATININE 0.87 07/21/2012 1306   CALCIUM 9.5 07/21/2012 1306   PROT 6.9 07/21/2012 1306   ALBUMIN 3.6 07/21/2012 1306   AST 18 07/21/2012 1306   ALT 13 07/21/2012 1306   ALKPHOS 67 07/21/2012 1306   BILITOT 0.4 07/21/2012 1306   GFRNONAA 69* 07/21/2012 1306   GFRAA 80* 07/21/2012 1306   Lab Results  Component Value Date  CHOL 169 07/23/2012   HDL 81 07/23/2012   LDLCALC 74 07/23/2012   TRIG 69 07/23/2012   CHOLHDL 2.1 07/23/2012    SIGNIFICANT DIAGNOSTIC STUDIES  CT Head 07/22/12 Stable posterior superior left frontal lobe parenchymal hemorrhage. Stable mild surrounding edema and no significant mass effect. No new intracranial abnormality.  CT Angio Head 07/22/2012 Stable left frontal lobe hemorrhage. No associated abnormal vascularity. Estimated hemorrhage volume 9 ml. Negative intracranial CTA aside from mild atherosclerosis. No new intracranial abnormality.  MRI of the brain patient has permanent pacemaker.  2D Echocardiogram not indicated for hemorrhagic CVA; although, this may have been an embolic stroke with hemorrhagic conversion.  Carotid Doppler 04/20/14No evidence of hemodynamically significant internal carotid artery stenosis. Vertebral artery flow is antegrade.  EEG This abnormal EEG is consistent with a left frontotemporal cerebral dysfunction. This is consistent with the patient's known hemorrhage in that region.  There was no seizure or seizure predisposition recorded on this study.   ASSESSMENT AND PLAN [name] is a 67 y.o. female with seizure activity, right hemiparesis, and aphasia on 07/21/12. Imaging confirmed a posterior superior left frontal lobe parenchymal hemorrhage with mild surrounding edema.  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 Patient with mild facial weakness.  PLAN: Continue Eliquis for secondary stroke prevention.  Maintain strict control of hypertension with blood pressure goal below 130/90, diabetes with hemoglobin A1c goal below 6.5% and lipids with LDL cholesterol goal below 100 mg/dL.   Followup in the future with me in 3 months  Meds ordered this encounter  Medications  . apixaban (ELIQUIS) 5 MG TABS tablet    Sig: Take 1 tablet (5 mg total) by mouth 2 (two) times daily.    Dispense:  60 tablet    Refill:  11    Order Specific Question:  Supervising Provider    Answer:  Micki Riley [[id number]]  . levETIRAcetam (KEPPRA) 1000 MG tablet    Sig: Take 1 tablet (1,000 mg total) by mouth 2 (two) times daily.    Dispense:  60 tablet    Refill:  11    Order Specific Question:  Supervising Provider    Answer:  Micki Riley [[id number]]  . lisinopril (PRINIVIL,ZESTRIL) 5 MG tablet    Sig: Take 1 tablet (5 mg total) by mouth daily.    Dispense:  30 tablet    Refill:  11    Order Specific Question:  Supervising Provider    Answer:  Micki Riley [[id number]]  . furosemide (LASIX) 20 MG tablet    Sig: Take 1 tablet (20 mg total) by mouth daily.    Dispense:  30 tablet    Refill:  11    Order Specific Question:  Supervising Provider    Answer:  Micki Riley [[id number]]  . citalopram (CELEXA) 10 MG tablet    Sig: Take 2 tablets (20 mg total) by mouth daily.    Dispense:  60 tablet    Refill:  11    Order Specific Question:  Supervising Provider    Answer:  Micki Riley [[id number]]   LYNN  LAM NP-C 10/18/2012, 2:42 PM  [facility] [address], [address] [place], [place] [address] [phone]  I have personally examined this patient, reviewed pertinent data, developed plan of care and discussed with patient and agree with above.  Delia Heady, MD

## 2012-11-08 ENCOUNTER — Telehealth: Payer: Self-pay | Admitting: Neurology

## 2012-11-09 NOTE — Telephone Encounter (Signed)
Advised patient to follow up with primary, patients showed understanding.

## 2013-01-21 ENCOUNTER — Ambulatory Visit: Payer: Medicare Other | Admitting: Neurology

## 2013-03-06 ENCOUNTER — Ambulatory Visit: Payer: Self-pay | Admitting: Internal Medicine

## 2013-03-15 ENCOUNTER — Ambulatory Visit: Payer: Self-pay | Admitting: Internal Medicine

## 2013-05-23 NOTE — Telephone Encounter (Signed)
Pt came in for her visit closing encounter °

## 2013-05-23 NOTE — Telephone Encounter (Signed)
Closing encounter

## 2013-08-15 DIAGNOSIS — E785 Hyperlipidemia, unspecified: Secondary | ICD-10-CM | POA: Insufficient documentation

## 2013-09-13 ENCOUNTER — Other Ambulatory Visit: Payer: Self-pay | Admitting: Nurse Practitioner

## 2013-10-07 ENCOUNTER — Other Ambulatory Visit: Payer: Self-pay | Admitting: Nurse Practitioner

## 2013-10-08 ENCOUNTER — Ambulatory Visit: Payer: Self-pay | Admitting: Internal Medicine

## 2013-10-17 ENCOUNTER — Other Ambulatory Visit: Payer: Self-pay | Admitting: Nurse Practitioner

## 2013-11-07 ENCOUNTER — Other Ambulatory Visit: Payer: Self-pay | Admitting: Nurse Practitioner

## 2014-01-06 ENCOUNTER — Other Ambulatory Visit: Payer: Self-pay | Admitting: Neurology

## 2014-07-25 NOTE — Op Note (Signed)
PATIENT NAME:  Traci Carter, Traci Carter MR#:  [id number] DATE OF BIRTH:  [birth year]  DATE OF PROCEDURE:  08/08/2012  PREPROCEDURE DIAGNOSES:  1.  Complete heart block.  2.  Elective replacement indication.   PROCEDURE: Dual-chamber pacemaker generator change out.   POSTPROCEDURE DIAGNOSIS: Atrial sensing with ventricular pacing.   INDICATION: The patient is a 69 year old female with dual-chamber pacemaker on 12/04/2003 for complete heart block. The patient also has a history of atrial fibrillation with recent probable embolic stroke on 69/79/4801. Recent pacemaker interrogation has shown pacemaker was at elective replacement indication.   DESCRIPTION OF PROCEDURE:  Procedure, risks, benefits and alternatives of pacemaker generator change out were explained to the patient and informed written consent was obtained.   She was brought to the Operating Room in a fasting state. The left pectoral region was prepped and draped in the usual sterile manner. Anesthesia was obtained with 1% Xylocaine locally. A 6 cm incision was performed over the left pectoral region. The old pacemaker generator was retrieved by electrocautery and blunt dissection. The old pacemaker generator was disconnected of the leads and the leads were interrogated. The leads were then connected to a new dual-chamber rate responsive pacemaker generator ([employer]tronic Adapta [id number]). The pacemaker pocket was irrigated with gentamicin solution. The new pacemaker generator was positioned in the pocket and the pocket was closed with 2-0 and 4-0 Vicryl, respectively. Steri-Strips and pressure dressing were applied.   ____________________________ Isaias Cowman, MD ap:jm D: 08/08/2012 13:52:39 ET T: 08/08/2012 14:03:56 ET JOB#: [id number]  cc: Isaias Cowman, MD, <Dictator> Isaias Cowman MD ELECTRONICALLY SIGNED 08/13/2012 13:48

## 2015-09-29 ENCOUNTER — Other Ambulatory Visit: Payer: Self-pay | Admitting: Internal Medicine

## 2015-09-29 DIAGNOSIS — R928 Other abnormal and inconclusive findings on diagnostic imaging of breast: Secondary | ICD-10-CM

## 2015-10-13 ENCOUNTER — Other Ambulatory Visit: Payer: Self-pay

## 2015-10-13 ENCOUNTER — Ambulatory Visit: Admission: RE | Admit: 2015-10-13 | Payer: Self-pay | Source: Ambulatory Visit

## 2015-11-12 ENCOUNTER — Ambulatory Visit: Payer: Self-pay

## 2015-11-12 ENCOUNTER — Other Ambulatory Visit: Payer: Self-pay

## 2015-11-26 ENCOUNTER — Ambulatory Visit
Admission: RE | Admit: 2015-11-26 | Discharge: 2015-11-26 | Disposition: A | Discharge status: Home / Self Care | Payer: Medicare Other | Source: Ambulatory Visit | Attending: Internal Medicine | Admitting: Internal Medicine

## 2015-11-26 ENCOUNTER — Encounter: Payer: Self-pay | Admitting: Radiology

## 2015-11-26 DIAGNOSIS — R928 Other abnormal and inconclusive findings on diagnostic imaging of breast: Secondary | ICD-10-CM | POA: Insufficient documentation

## 2015-11-26 DIAGNOSIS — Z950 Presence of cardiac pacemaker: Secondary | ICD-10-CM | POA: Insufficient documentation

## 2015-11-26 DIAGNOSIS — N6489 Other specified disorders of breast: Secondary | ICD-10-CM | POA: Diagnosis present

## 2016-10-14 ENCOUNTER — Other Ambulatory Visit: Payer: Self-pay | Admitting: Internal Medicine

## 2016-10-31 ENCOUNTER — Other Ambulatory Visit: Payer: Self-pay | Admitting: Internal Medicine

## 2016-10-31 DIAGNOSIS — Z1231 Encounter for screening mammogram for malignant neoplasm of breast: Secondary | ICD-10-CM

## 2017-05-12 ENCOUNTER — Ambulatory Visit
Admission: RE | Admit: 2017-05-12 | Discharge: 2017-05-12 | Disposition: A | Discharge status: Home / Self Care | Payer: Medicare Other | Source: Ambulatory Visit | Attending: Internal Medicine | Admitting: Internal Medicine

## 2017-05-12 DIAGNOSIS — Z1231 Encounter for screening mammogram for malignant neoplasm of breast: Secondary | ICD-10-CM | POA: Insufficient documentation

## 2017-05-25 ENCOUNTER — Encounter: Payer: Self-pay | Admitting: *Deleted

## 2017-06-08 ENCOUNTER — Encounter: Payer: Self-pay | Admitting: General Surgery

## 2017-06-08 ENCOUNTER — Ambulatory Visit: Payer: Medicare Other | Admitting: General Surgery

## 2017-06-08 VITALS — BP 128/74 | HR 85 | Resp 16 | Ht 64.0 in | Wt 256.0 lb

## 2017-06-08 DIAGNOSIS — R197 Diarrhea, unspecified: Secondary | ICD-10-CM | POA: Diagnosis not present

## 2017-06-08 MED ORDER — POLYETHYLENE GLYCOL 3350 17 GM/SCOOP PO POWD: ORAL | 0 refills | Status: DC

## 2017-06-08 NOTE — Progress Notes (Signed)
Patient ID: Traci Carter, female   DOB: 1945-09-12, 72 y.o.   MRN: [id number]  Chief Complaint  Patient presents with  . Other    HPI Traci Carter is a 72 y.o. female here today for a evaluation of a colonoscopy referred by Dr Hall Busing. She complains of constant diarrhea for about 6 months. She does admit to right lower abdominal cramps, liquid watery with occasional mucous. She states the cramps last all day. Denies vomiting or bleeding. She does admit to occasional leakage of stool. Occasional urgency with bowel movements.  Several episodes where she was unable to get to the commode in time.  Bowels move 3-4 times a day occasionally with a different smell.  Previously, she would move her bowels once per day.  Occasionally the stool with float in the toilet bowl. She is using lomotil which helps if she takes 5 a day. Denies any medication changes. No association with foods other than mushrooms.  She is followed by Dr Saralyn Pilar and has an appointment 06-15-17. 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     HPI  Past Medical History:  Diagnosis Date  . Afib ([place])   . Diabetes mellitus without complication ([place])   . Dysrhythmia   . Hypertension   . 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 ABDOMINAL HYSTERECTOMY    . COLONOSCOPY  01/02/2009   Dr Bary Castilla diverticulosis.  Randolm Idol / REPLACE / REMOVE PACEMAKER     Dr Saralyn Pilar  . JOINT REPLACEMENT  2005   knee    Family History  Problem Relation Age of Onset  . Breast cancer Mother   . Prostate cancer Father     Social History Social History   Tobacco Use  . Smoking status: Never Smoker  . Smokeless tobacco: Never Used  Substance Use Topics  . Alcohol use: Yes    Alcohol/week: 0.6 oz    Types: 1 Cans of beer per week    Comment: rare  . Drug use: No    Allergies  Allergen  Reactions  . Codeine Other (See Comments)    GI Distress  . Lanoxin [Digoxin]   . Zoloft [Sertraline Hcl]   . Penicillins Rash    Current Outpatient Medications  Medication Sig Dispense Refill  . albuterol (PROAIR HFA) 108 (90 Base) MCG/ACT inhaler     . apixaban (ELIQUIS) 5 MG TABS tablet Take 1 tablet (5 mg total) by mouth 2 (two) times daily. 60 tablet 11  . atorvastatin (LIPITOR) 20 MG tablet Take 20 mg by mouth daily.    . busPIRone (BUSPAR) 10 MG tablet     . citalopram (CELEXA) 10 MG tablet TAKE 2 TABLETS BY MOUTH EVERY DAY 60 tablet 0  . colchicine 0.6 MG tablet Take 1 tablet twice daily while under a gout flare.    . furosemide (LASIX) 20 MG tablet Take 1 tablet (20 mg total) by mouth daily. 30 tablet 11  . glipiZIDE (GLUCOTROL) 5 MG tablet     . levETIRAcetam (KEPPRA) 1000 MG tablet Take 1 tablet (1,000 mg total) by mouth 2 (two) times daily. 60 tablet 11  . lisinopril (PRINIVIL,ZESTRIL) 5 MG tablet Take 1 tablet (5 mg total) by mouth daily. 30 tablet 11  . loperamide (IMODIUM) 2 MG capsule Take 10 mg by mouth at bedtime.     Marland Kitchen LORazepam (ATIVAN)  1 MG tablet TK 1 T PO BID    . montelukast (SINGULAIR) 10 MG tablet     . zolpidem (AMBIEN) 10 MG tablet Take 10 mg by mouth at bedtime.    . polyethylene glycol powder (GLYCOLAX/MIRALAX) powder 255 grams one bottle for colonoscopy prep 255 g 0   No current facility-administered medications for this visit.     Review of Systems Review of Systems  Constitutional: Negative.   Respiratory: Negative.   Cardiovascular: Negative.   Gastrointestinal: Positive for diarrhea.    Blood pressure 128/74, pulse 85, resp. rate 16, height 5\' 4"  (1.626 m), weight 256 lb (116.1 kg).  Physical Exam Physical Exam  Constitutional: She is oriented to person, place, and time. She appears well-developed and well-nourished.  HENT:  Mouth/Throat: Oropharynx is clear and moist.  Eyes: Conjunctivae are normal.  Neck: Neck supple.  Cardiovascular:  Normal rate, regular rhythm and normal heart sounds.  Pulses:      Femoral pulses are 2+ on the right side, and 2+ on the left side. Sinus rhythm evident at this time.  Pulmonary/Chest: Effort normal and breath sounds normal.  Abdominal: Soft. Normal appearance and bowel sounds are normal. There is tenderness in the right lower quadrant.    Faint rash left abdomen  Lymphadenopathy:    She has no cervical adenopathy.  Neurological: She is alert and oriented to person, place, and time.  Skin: Skin is warm and dry.  Psychiatric: Her behavior is normal.    Data Reviewed PCP notes of May 19, 2017.  Laboratory studies of the same date showed a normal CBC with a hemoglobin of 12.9, MCV of 94, white blood cell count of 9900 with a normal differential.  Elevated blood sugar of 212, creatinine 1.02 with an estimated GFR of 56, scant elevation of the serum sodium at 145.  Normal liver function studies.  Normal lipase.  Laboratory studies from October 24, 2008 showed a creatinine of 1.04 with an estimated GFR of 54.  Assessment    Change in bowel habits.  Normal laboratory studies.    Plan    Colonoscopy with possible biopsy/polypectomy prn: Information regarding the procedure, including its potential risks and complications (including but not limited to perforation of the bowel, which may require emergency surgery to repair, and bleeding) was verbally given to the patient. Educational information regarding lower intestinal endoscopy was given to the patient. Written instructions for how to complete the bowel prep using Miralax were provided. The importance of drinking ample fluids to avoid dehydration as a result of the prep emphasized.  The patient is scheduled for a routine follow-up with her cardiologist on June 15, 2017 for a pacemaker check.  Will plan for a colonoscopy after that exam.  Patient is aware that there is a small risk of stroke while she is off her anticoagulation.  As  she is in sinus rhythm today, hopefully this risk will be minimal.  HPI, Physical Exam, Assessment and Plan have been scribed under the direction and in the presence of Robert Bellow, MD. Karie Fetch, RN  I have completed the exam and reviewed the above documentation for accuracy and completeness.  I agree with the above.  Haematologist has been used and any errors in dictation or transcription are unintentional.  Hervey Ard, M.D., F.A.C.S.  Forest Gleason Elisabeth Strom 06/09/2017, 7:15 AM  Patient has been scheduled for a colonoscopy on 06-28-17 at [facility]. Miralax prescription has been sent in to the patient's pharmacy today. Patient  has been asked to stop Eliquis 3 days prior to procedure. She will take her last dose on 06-24-17. This patient has been asked to hold glipizide day of colonoscopy prep and procedure. Patient has been instructed to only take Keppra at 6 am the morning of procedure with a small sip of water. Patient reports that she has not used her inhaler in a while. She was told she could use her albuterol inhaler if needed the morning of procedure. Colonoscopy instructions have been reviewed with the patient. This patient is aware to call the office if they have further questions.   Dominga Ferry, CMA

## 2017-06-08 NOTE — Patient Instructions (Signed)
The patient is aware to call back for any questions or concerns.  Colonoscopy, Adult A colonoscopy is an exam to look at the entire large intestine. During the exam, a lubricated, bendable tube is inserted into the anus and then passed into the rectum, colon, and other parts of the large intestine. A colonoscopy is often done as a part of normal colorectal screening or in response to certain symptoms, such as anemia, persistent diarrhea, abdominal pain, and blood in the stool. The exam can help screen for and diagnose medical problems, including:  Tumors.  Polyps.  Inflammation.  Areas of bleeding.  Tell a health care provider about:  Any allergies you have.  All medicines you are taking, including vitamins, herbs, eye drops, creams, and over-the-counter medicines.  Any problems you or family members have had with anesthetic medicines.  Any blood disorders you have.  Any surgeries you have had.  Any medical conditions you have.  Any problems you have had passing stool. What are the risks? Generally, this is a safe procedure. However, problems may occur, including:  Bleeding.  A tear in the intestine.  A reaction to medicines given during the exam.  Infection (rare).  What happens before the procedure? Eating and drinking restrictions Follow instructions from your health care provider about eating and drinking, which may include:  A few days before the procedure - follow a low-fiber diet. Avoid nuts, seeds, dried fruit, raw fruits, and vegetables.  1-3 days before the procedure - follow a clear liquid diet. Drink only clear liquids, such as clear broth or bouillon, black coffee or tea, clear juice, clear soft drinks or sports drinks, gelatin dessert, and popsicles. Avoid any liquids that contain red or purple dye.  On the day of the procedure - do not eat or drink anything during the 2 hours before the procedure, or within the time period that your health care provider  recommends.  Bowel prep If you were prescribed an oral bowel prep to clean out your colon:  Take it as told by your health care provider. Starting the day before your procedure, you will need to drink a large amount of medicated liquid. The liquid will cause you to have multiple loose stools until your stool is almost clear or light green.  If your skin or anus gets irritated from diarrhea, you may use these to relieve the irritation: ? Medicated wipes, such as adult wet wipes with aloe and vitamin E. ? A skin soothing-product like petroleum jelly.  If you vomit while drinking the bowel prep, take a break for up to 60 minutes and then begin the bowel prep again. If vomiting continues and you cannot take the bowel prep without vomiting, call your health care provider.  General instructions  Ask your health care provider about changing or stopping your regular medicines. This is especially important if you are taking diabetes medicines or blood thinners.  Plan to have someone take you home from the hospital or clinic. What happens during the procedure?  An IV tube may be inserted into one of your veins.  You will be given medicine to help you relax (sedative).  To reduce your risk of infection: ? Your health care team will wash or sanitize their hands. ? Your anal area will be washed with soap.  You will be asked to lie on your side with your knees bent.  Your health care provider will lubricate a long, thin, flexible tube. The tube will have a camera and   a light on the end.  The tube will be inserted into your anus.  The tube will be gently eased through your rectum and colon.  Air will be delivered into your colon to keep it open. You may feel some pressure or cramping.  The camera will be used to take images during the procedure.  A small tissue sample may be removed from your body to be examined under a microscope (biopsy). If any potential problems are found, the tissue  will be sent to a lab for testing.  If small polyps are found, your health care provider may remove them and have them checked for cancer cells.  The tube that was inserted into your anus will be slowly removed. The procedure may vary among health care providers and hospitals. What happens after the procedure?  Your blood pressure, heart rate, breathing rate, and blood oxygen level will be monitored until the medicines you were given have worn off.  Do not drive for 24 hours after the exam.  You may have a small amount of blood in your stool.  You may pass gas and have mild abdominal cramping or bloating due to the air that was used to inflate your colon during the exam.  It is up to you to get the results of your procedure. Ask your health care provider, or the department performing the procedure, when your results will be ready. This information is not intended to replace advice given to you by your health care provider. Make sure you discuss any questions you have with your health care provider. Document Released: 03/18/2000 Document Revised: 01/20/2016 Document Reviewed: 06/02/2015 Elsevier Interactive Patient Education  2018 Elsevier Inc.  

## 2017-06-09 ENCOUNTER — Encounter: Payer: Self-pay | Admitting: General Surgery

## 2017-06-09 DIAGNOSIS — R197 Diarrhea, unspecified: Secondary | ICD-10-CM | POA: Insufficient documentation

## 2017-06-28 ENCOUNTER — Encounter
Admission: RE | Disposition: A | Discharge status: Home / Self Care | Payer: Self-pay | Source: Ambulatory Visit | Attending: General Surgery

## 2017-06-28 ENCOUNTER — Ambulatory Visit
Admission: RE | Admit: 2017-06-28 | Discharge: 2017-06-28 | Disposition: A | Discharge status: Home / Self Care | Payer: Medicare Other | Source: Ambulatory Visit | Attending: General Surgery | Admitting: General Surgery

## 2017-06-28 ENCOUNTER — Ambulatory Visit: Payer: Medicare Other | Admitting: Anesthesiology

## 2017-06-28 ENCOUNTER — Other Ambulatory Visit: Payer: Self-pay

## 2017-06-28 ENCOUNTER — Encounter: Payer: Self-pay | Admitting: *Deleted

## 2017-06-28 DIAGNOSIS — D125 Benign neoplasm of sigmoid colon: Secondary | ICD-10-CM | POA: Diagnosis not present

## 2017-06-28 DIAGNOSIS — R197 Diarrhea, unspecified: Secondary | ICD-10-CM

## 2017-06-28 DIAGNOSIS — Z7984 Long term (current) use of oral hypoglycemic drugs: Secondary | ICD-10-CM | POA: Diagnosis not present

## 2017-06-28 DIAGNOSIS — J45909 Unspecified asthma, uncomplicated: Secondary | ICD-10-CM | POA: Diagnosis not present

## 2017-06-28 DIAGNOSIS — Z96659 Presence of unspecified artificial knee joint: Secondary | ICD-10-CM | POA: Insufficient documentation

## 2017-06-28 DIAGNOSIS — K6289 Other specified diseases of anus and rectum: Secondary | ICD-10-CM | POA: Diagnosis not present

## 2017-06-28 DIAGNOSIS — I4891 Unspecified atrial fibrillation: Secondary | ICD-10-CM | POA: Insufficient documentation

## 2017-06-28 DIAGNOSIS — K529 Noninfective gastroenteritis and colitis, unspecified: Secondary | ICD-10-CM | POA: Diagnosis not present

## 2017-06-28 DIAGNOSIS — Z950 Presence of cardiac pacemaker: Secondary | ICD-10-CM | POA: Insufficient documentation

## 2017-06-28 DIAGNOSIS — Z79899 Other long term (current) drug therapy: Secondary | ICD-10-CM | POA: Insufficient documentation

## 2017-06-28 DIAGNOSIS — Z8673 Personal history of transient ischemic attack (TIA), and cerebral infarction without residual deficits: Secondary | ICD-10-CM | POA: Diagnosis not present

## 2017-06-28 DIAGNOSIS — E119 Type 2 diabetes mellitus without complications: Secondary | ICD-10-CM | POA: Insufficient documentation

## 2017-06-28 DIAGNOSIS — I10 Essential (primary) hypertension: Secondary | ICD-10-CM | POA: Diagnosis not present

## 2017-06-28 DIAGNOSIS — R1030 Lower abdominal pain, unspecified: Secondary | ICD-10-CM | POA: Insufficient documentation

## 2017-06-28 HISTORY — PX: COLONOSCOPY WITH PROPOFOL: SHX5780

## 2017-06-28 LAB — GLUCOSE, CAPILLARY: GLUCOSE-CAPILLARY: 135 mg/dL — AB (ref 65–99)

## 2017-06-28 SURGERY — COLONOSCOPY WITH PROPOFOL
Anesthesia: General

## 2017-06-28 MED ORDER — PROPOFOL 500 MG/50ML IV EMUL
INTRAVENOUS | Status: AC
  Filled 2017-06-28: qty 50

## 2017-06-28 MED ORDER — PROPOFOL 500 MG/50ML IV EMUL
INTRAVENOUS | Status: DC | PRN
  Administered 2017-06-28: 150 ug/kg/min via INTRAVENOUS

## 2017-06-28 MED ORDER — SODIUM CHLORIDE 0.9 % IV SOLN
INTRAVENOUS | Status: DC
  Administered 2017-06-28 (×2): via INTRAVENOUS

## 2017-06-28 MED ORDER — PROPOFOL 10 MG/ML IV BOLUS
INTRAVENOUS | Status: DC | PRN
  Administered 2017-06-28: 60 mg via INTRAVENOUS

## 2017-06-28 MED ORDER — APIXABAN 5 MG PO TABS: 5.0000 mg | ORAL_TABLET | Freq: Two times a day (BID) | ORAL | 11 refills | Status: DC

## 2017-06-28 NOTE — H&P (Signed)
Hx of new onset diarrhea. For colonoscopy. No change in clinical history or exam. Reports no concerns on recent cardiology visit.

## 2017-06-28 NOTE — Anesthesia Post-op Follow-up Note (Signed)
Anesthesia QCDR form completed.        

## 2017-06-28 NOTE — Anesthesia Procedure Notes (Signed)
Date/Time: 06/28/2017 9:28 AM Performed by: Nelda Marseille, CRNA Pre-anesthesia Checklist: Patient identified, Emergency Drugs available, Suction available, Patient being monitored and Timeout performed Oxygen Delivery Method: Nasal cannula

## 2017-06-28 NOTE — Op Note (Signed)
[facility] Gastroenterology Patient Name: Traci Carter Procedure Date: 06/28/2017 9:18 AM MRN: [id number] Account #: [id number] Date of Birth: October 11, 1945 Admit Type: Outpatient Age: 72 Room: [facility] ENDO ROOM 1 Gender: Female Note Status: Finalized Procedure:            Colonoscopy Indications:          Chronic diarrhea Providers:            Robert Bellow, MD Referring MD:         Leona Carry. Hall Busing, MD (Referring MD) Medicines:            Monitored Anesthesia Care Complications:        No immediate complications. Procedure:            Pre-Anesthesia Assessment:                       - Prior to the procedure, a History and Physical was                        performed, and patient medications, allergies and                        sensitivities were reviewed. The patient's tolerance of                        previous anesthesia was reviewed.                       - The risks and benefits of the procedure and the                        sedation options and risks were discussed with the                        patient. All questions were answered and informed                        consent was obtained.                       After obtaining informed consent, the colonoscope was                        passed under direct vision. Throughout the procedure,                        the patient's blood pressure, pulse, and oxygen                        saturations were monitored continuously. The                        Colonoscope was introduced through the anus and                        advanced to the the ileocecal valve. The colonoscopy                        was performed without difficulty. The patient tolerated  the procedure well. The quality of the bowel                        preparation was excellent. Findings:      A 8 mm polyp was found in the sigmoid colon. The polyp was sessile.       Biopsies were taken with a cold forceps for  histology.      Biopsies of the right colon, left colon and rectum were obtained x 3 at       each location. Impression:           - One 8 mm polyp in the sigmoid colon. Biopsied. Recommendation:       - Telephone endoscopist for pathology results in 1 week. Procedure Code(s):    --- Professional ---                       [phone], Colonoscopy, flexible; with biopsy, single or                        multiple Diagnosis Code(s):    --- Professional ---                       D12.5, Benign neoplasm of sigmoid colon                       K52.9, Noninfective gastroenteritis and colitis,                        unspecified CPT copyright 2016 American Medical Association. All rights reserved. The codes documented in this report are preliminary and upon coder review may  be revised to meet current compliance requirements. Robert Bellow, MD 06/28/2017 9:50:36 AM This report has been signed electronically. Number of Addenda: 0 Note Initiated On: 06/28/2017 9:18 AM Scope Withdrawal Time: 0 hours 14 minutes 45 seconds  Total Procedure Duration: 0 hours 21 minutes 50 seconds       [facility]

## 2017-06-28 NOTE — Transfer of Care (Signed)
Immediate Anesthesia Transfer of Care Note  Patient: Traci Carter  Procedure(s) Performed: COLONOSCOPY WITH PROPOFOL (N/A )  Patient Location: PACU  Anesthesia Type:General  Level of Consciousness: awake and sedated  Airway & Oxygen Therapy: Patient Spontanous Breathing and Patient connected to nasal cannula oxygen  Post-op Assessment: Report given to RN and Post -op Vital signs reviewed and stable  Post vital signs: Reviewed and stable  Last Vitals:  Vitals Value Taken Time  BP 96/61 06/28/2017  9:51 AM  Temp 36.2 C 06/28/2017  9:51 AM  Pulse 76 06/28/2017  9:51 AM  Resp 16 06/28/2017  9:51 AM  SpO2 98 % 06/28/2017  9:51 AM    Last Pain:  Vitals:   06/28/17 0951  TempSrc:   PainSc: 0-No pain         Complications: No apparent anesthesia complications

## 2017-06-28 NOTE — Anesthesia Preprocedure Evaluation (Signed)
Anesthesia Evaluation  Patient identified by MRN, date of birth, ID band Patient awake    Reviewed: Allergy & Precautions, H&P , NPO status , Patient's Chart, lab work & pertinent test results  History of Anesthesia Complications Negative for: history of anesthetic complications  Airway Mallampati: III  TM Distance: <3 FB Neck ROM: limited    Dental  (+) Chipped, Poor Dentition, Missing, Partial Upper   Pulmonary neg pulmonary ROS, neg shortness of breath, asthma ,           Cardiovascular Exercise Tolerance: Good hypertension, (-) angina(-) Past MI negative cardio ROS  + dysrhythmias Atrial Fibrillation + pacemaker      Neuro/Psych CVA, Residual Symptoms negative neurological ROS  negative psych ROS   GI/Hepatic negative GI ROS, Neg liver ROS,   Endo/Other  diabetes, Type 2  Renal/GU negative Renal ROS  negative genitourinary   Musculoskeletal   Abdominal   Peds  Hematology negative hematology ROS (+)   Anesthesia Other Findings Past Medical History: No date: Afib ([place]) No date: Diabetes mellitus without complication ([facility]) No date: Dysrhythmia No date: Hypertension 07/21/12: Stroke [facility])     Comment:  posterior superior left frontal lobe parenchymal               hemorrhage   Past Surgical History: No date: ABDOMINAL HYSTERECTOMY No date: CARPAL TUNNEL RELEASE 01/02/2009: COLONOSCOPY     Comment:  Dr Bary Castilla diverticulosis. No date: ELBOW SURGERY No date: INSERT / REPLACE / REMOVE PACEMAKER     Comment:  Dr Saralyn Pilar 2005: JOINT REPLACEMENT     Comment:  knee No date: SHOULDER ARTHROSCOPY     Reproductive/Obstetrics negative OB ROS                             Anesthesia Physical Anesthesia Plan  ASA: IV  Anesthesia Plan: General   Post-op Pain Management:    Induction: Intravenous  PONV Risk Score and Plan: Propofol infusion and TIVA  Airway Management Planned:  Natural Airway and Nasal Cannula  Additional Equipment:   Intra-op Plan:   Post-operative Plan:   Informed Consent: I have reviewed the patients History and Physical, chart, labs and discussed the procedure including the risks, benefits and alternatives for the proposed anesthesia with the patient or authorized representative who has indicated his/her understanding and acceptance.   Dental Advisory Given  Plan Discussed with: Anesthesiologist, CRNA and Surgeon  Anesthesia Plan Comments: (Patient consented for risks of anesthesia including but not limited to:  - adverse reactions to medications - risk of intubation if required - damage to teeth, lips or other oral mucosa - sore throat or hoarseness - Damage to heart, brain, lungs or loss of life  Patient voiced understanding.)        Anesthesia Quick Evaluation

## 2017-06-28 NOTE — Anesthesia Postprocedure Evaluation (Signed)
Anesthesia Post Note  Patient: Traci Carter  Procedure(s) Performed: COLONOSCOPY WITH PROPOFOL (N/A )  Patient location during evaluation: Endoscopy Anesthesia Type: General Level of consciousness: awake and alert Pain management: pain level controlled Vital Signs Assessment: post-procedure vital signs reviewed and stable Respiratory status: spontaneous breathing, nonlabored ventilation, respiratory function stable and patient connected to nasal cannula oxygen Cardiovascular status: blood pressure returned to baseline and stable Postop Assessment: no apparent nausea or vomiting Anesthetic complications: no     Last Vitals:  Vitals:   06/28/17 0951 06/28/17 1001  BP: 96/61 108/63  Pulse: 76 71  Resp: 16 17  Temp: (!) 36.2 C   SpO2: 98% 98%    Last Pain:  Vitals:   06/28/17 1011  TempSrc:   PainSc: 0-No pain                 Precious Haws Bronson Bressman

## 2017-06-29 LAB — SURGICAL PATHOLOGY

## 2017-06-30 ENCOUNTER — Telehealth: Payer: Self-pay

## 2017-06-30 NOTE — Telephone Encounter (Signed)
-----   Message from Robert Bellow, MD sent at 06/29/2017  5:50 PM EDT ----- Please notify the patient the polyps was fine, but she should have a f/u colonoscopy in five years.  I will discuss w/ Dr. Hall Busing.

## 2017-06-30 NOTE — Telephone Encounter (Signed)
Notified patient as instructed, patient pleased. Discussed follow-up appointments, patient agrees. Patient placed in recalls.   

## 2017-07-04 ENCOUNTER — Telehealth: Payer: Self-pay

## 2017-07-04 NOTE — Telephone Encounter (Signed)
I spoke with the patient and she will discontinue the use of the Prilosec and start the Zantac OTC 75 mg twice a day. She very rarely  uses the Colchicine. She will call us back in 10 days with an update.

## 2017-07-04 NOTE — Telephone Encounter (Signed)
-----   Message from Robert Bellow, MD sent at 06/30/2017  4:21 PM EDT ----- These notify the patient that I have spoken with Dr. Hall Busing.  We would like her to stop her omeprazole, Prilosec and make use of Zantac OTC 75 mg twice a day.  After 10 days would like a progress report in regards to her diarrhea.  Find out if she is making use of colchicine on any regular basis.  If her diarrhea persists, we will go ahead and arrange for a upper GI and small bowel follow-through.  Thank you

## 2017-08-01 ENCOUNTER — Encounter: Payer: Self-pay | Admitting: General Surgery

## 2017-08-03 ENCOUNTER — Telehealth: Payer: Self-pay

## 2017-08-03 DIAGNOSIS — K529 Noninfective gastroenteritis and colitis, unspecified: Secondary | ICD-10-CM

## 2017-08-03 NOTE — Telephone Encounter (Signed)
-----   Message from Robert Bellow, MD sent at 08/03/2017  9:12 AM EDT ----- The patient was supposed to give [place] a follow-up mid April.  Please give her a call and see how she is doing.  Thank you ----- Message ----- From: Lesly Rubenstein, LPN Sent: 10/07/8113  72:62 AM To: Robert Bellow, MD  She does not use the Colchicine very often at all and only one a day when she has a Gout flair. She will call with an update in 10 days.  ----- Message ----- From: Robert Bellow, MD Sent: 06/30/2017   4:21 PM To: Lesly Rubenstein, LPN  These notify the patient that I have spoken with Dr. Hall Busing.  We would like her to stop her omeprazole, Prilosec and make use of Zantac OTC 75 mg twice a day.  After 10 days would like a progress report in regards to her diarrhea.  Find out if she is making use of colchicine on any regular basis.  If her diarrhea persists, we will go ahead and arrange for a upper GI and small bowel follow-through.  Thank you

## 2017-08-03 NOTE — Telephone Encounter (Signed)
I spoke with the patient about an update on how she is doing. She states that she still has a lot of diarrhea. The mucus is gone. She has nausea in the morning but eating does help although she does not have much of an appetite. She also states that the stomach pain is better. I let her know that since she is still having the diarrhea Dr Bary Castilla would like her to complete an Upper GI with small bowel follow-through. She is amendable to this.  The patient is scheduled for upper GI with SBFT at [facility] on Friday May 10th at 11:00 am. She is to arrive by 10:45 am and nothing to eat or drink after midnight the night prior.  The patient is aware of date, time, and instructions.

## 2017-08-11 ENCOUNTER — Ambulatory Visit
Admission: RE | Admit: 2017-08-11 | Discharge: 2017-08-11 | Disposition: A | Discharge status: Home / Self Care | Payer: Medicare Other | Source: Ambulatory Visit | Attending: General Surgery | Admitting: General Surgery

## 2017-08-11 DIAGNOSIS — K219 Gastro-esophageal reflux disease without esophagitis: Secondary | ICD-10-CM | POA: Diagnosis not present

## 2017-08-11 DIAGNOSIS — K529 Noninfective gastroenteritis and colitis, unspecified: Secondary | ICD-10-CM | POA: Diagnosis present

## 2017-08-21 ENCOUNTER — Telehealth: Payer: Self-pay

## 2017-08-21 NOTE — Telephone Encounter (Signed)
UGI/SBFT reviewed.  No abnormality that would account for recent onset of diarrhea.  Previous colonoscopy including random biopsies did not show evidence of colitis.  Informal discussion with GI have recommended a trial of Imodium 3-4 times per day as needed.  We will arrange for a follow-up exam in approximately 3-4 weeks to see her response.

## 2017-08-21 NOTE — Telephone Encounter (Signed)
Patient called asking about her imaging results and follow up.

## 2017-08-22 NOTE — Telephone Encounter (Signed)
Patient will try the Imodium as well as the high fiber diet. She will follow up here on 09/21/17.

## 2017-09-21 ENCOUNTER — Ambulatory Visit: Payer: Medicare Other | Admitting: General Surgery

## 2017-09-21 ENCOUNTER — Encounter: Payer: Self-pay | Admitting: General Surgery

## 2017-09-21 VITALS — BP 126/64 | HR 86 | Resp 18 | Ht 64.0 in | Wt 240.0 lb

## 2017-09-21 DIAGNOSIS — K529 Noninfective gastroenteritis and colitis, unspecified: Secondary | ICD-10-CM | POA: Diagnosis not present

## 2017-09-21 NOTE — Progress Notes (Signed)
Patient ID: Traci Carter, female   DOB: May 19, 1945, 71 y.o.   MRN: [id number]  Chief Complaint  Patient presents with  . Follow-up    HPI Traci Carter is a 72 y.o. female here follow up for diarrhea, she is using several sips of immodium daily as well as pepto daily. She does think the stools are becoming more formed, although she still occasionally has a little cramping.    She has had resolution of the fecal incontinence due to urge defecation she reported at her last visit.    She does admit to persistent daily morning nausea lasting 5-10 minutes. She uses Tums and Rolaids as well. She has been using Pepto for 2-3 months. She states she was previously on Prilosec, Zantac and Nexium. UGI and small bowel xray was 08-11-17.  HPI  Past Medical History:  Diagnosis Date  . Afib ([place])   . Diabetes mellitus without complication ([place])   . Dysrhythmia   . Hypertension   . 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 ABDOMINAL HYSTERECTOMY    . CARPAL TUNNEL RELEASE    . COLONOSCOPY  01/02/2009   Dr Bary Castilla diverticulosis.  . COLONOSCOPY WITH PROPOFOL N/A 06/28/2017   Procedure: COLONOSCOPY WITH PROPOFOL;  Surgeon: Robert Bellow, MD;  Location: [facility] ENDOSCOPY;  Service: Endoscopy;  Laterality: N/A;  . ELBOW SURGERY    . INSERT / REPLACE / REMOVE PACEMAKER     Dr Saralyn Pilar  . JOINT REPLACEMENT  2005   knee  . SHOULDER ARTHROSCOPY      Family History  Problem Relation Age of Onset  . Breast cancer Mother   . Prostate cancer Father     Social History Social History   Tobacco Use  . Smoking status: Never Smoker  . Smokeless tobacco: Never Used  Substance Use Topics  . Alcohol use: Yes    Alcohol/week: 0.6 oz    Types: 1 Cans of beer per week    Comment: rare  . Drug use: No    Allergies  Allergen Reactions  . Codeine Other (See Comments)    GI Distress  . Lanoxin  [Digoxin]   . Zoloft [Sertraline Hcl]   . Penicillins Rash    Current Outpatient Medications  Medication Sig Dispense Refill  . albuterol (PROAIR HFA) 108 (90 Base) MCG/ACT inhaler     . apixaban (ELIQUIS) 5 MG TABS tablet Take 1 tablet (5 mg total) by mouth 2 (two) times daily. 60 tablet 11  . atorvastatin (LIPITOR) 20 MG tablet Take 20 mg by mouth daily.    Marland Kitchen bismuth subsalicylate (PEPTO BISMOL) 262 MG/15ML suspension Take 30 mLs by mouth daily.    . busPIRone (BUSPAR) 10 MG tablet     . citalopram (CELEXA) 10 MG tablet TAKE 2 TABLETS BY MOUTH EVERY DAY 60 tablet 0  . colchicine 0.6 MG tablet Take 1 tablet twice daily while under a gout flare.    . furosemide (LASIX) 20 MG tablet Take 1 tablet (20 mg total) by mouth daily. 30 tablet 11  . glipiZIDE (GLUCOTROL) 5 MG tablet     . levETIRAcetam (KEPPRA) 1000 MG tablet Take 1 tablet (1,000 mg total) by mouth 2 (two) times daily. 60 tablet 11  . lisinopril (PRINIVIL,ZESTRIL) 5 MG tablet Take 1 tablet (5 mg total) by mouth daily. 30 tablet 11  . Loperamide HCl (IMODIUM A-D) 1 MG/7.5ML LIQD  Take by mouth daily.    Marland Kitchen LORazepam (ATIVAN) 1 MG tablet TK 1 T PO BID    . montelukast (SINGULAIR) 10 MG tablet     . zolpidem (AMBIEN) 10 MG tablet Take 10 mg by mouth at bedtime.     No current facility-administered medications for this visit.     Review of Systems Review of Systems  Constitutional: Negative.   Respiratory: Negative.   Cardiovascular: Negative.   Gastrointestinal: Positive for nausea. Negative for abdominal pain.    Blood pressure 126/64, pulse 86, resp. rate 18, height 5\' 4"  (1.626 m), weight 240 lb (108.9 kg).  Physical Exam Physical Exam  Constitutional: She is oriented to person, place, and time. She appears well-developed and well-nourished.  Neurological: She is alert and oriented to person, place, and time.  Skin: Skin is warm and dry.  Psychiatric: Her behavior is normal.    Data Reviewed Small bowel  follow-through series of Aug 11, 2017 showed no evidence of inflammatory bowel disease.  Assessment    Improvement in diarrhea with institution of Imodium.    Plan     Recommend a trial of fiber supplement (Metamucil or a fiber tablet) twice a daily  Phone call follow up in 10 days     HPI, Physical Exam, Assessment and Plan have been scribed under the direction and in the presence of Robert Bellow, MD. Karie Fetch, RN  I have completed the exam and reviewed the above documentation for accuracy and completeness.  I agree with the above.  Haematologist has been used and any errors in dictation or transcription are unintentional.  Hervey Ard, M.D., F.A.C.S.   Traci Carter 09/22/2017, 10:22 AM

## 2017-09-21 NOTE — Patient Instructions (Addendum)
Recommend using fiber supplement (Metamucil or a fiber tablet) twice) daily  Phone follow up in 10 days

## 2018-10-16 ENCOUNTER — Encounter: Payer: Self-pay | Admitting: Nurse Practitioner

## 2018-10-16 DIAGNOSIS — I2510 Atherosclerotic heart disease of native coronary artery without angina pectoris: Secondary | ICD-10-CM | POA: Insufficient documentation

## 2018-10-16 DIAGNOSIS — E785 Hyperlipidemia, unspecified: Secondary | ICD-10-CM | POA: Insufficient documentation

## 2018-10-16 DIAGNOSIS — E1169 Type 2 diabetes mellitus with other specified complication: Secondary | ICD-10-CM | POA: Insufficient documentation

## 2018-10-16 DIAGNOSIS — M797 Fibromyalgia: Secondary | ICD-10-CM | POA: Insufficient documentation

## 2018-10-16 DIAGNOSIS — I480 Paroxysmal atrial fibrillation: Secondary | ICD-10-CM | POA: Insufficient documentation

## 2018-10-16 DIAGNOSIS — Z950 Presence of cardiac pacemaker: Secondary | ICD-10-CM | POA: Insufficient documentation

## 2018-10-16 DIAGNOSIS — G4733 Obstructive sleep apnea (adult) (pediatric): Secondary | ICD-10-CM | POA: Insufficient documentation

## 2018-10-16 DIAGNOSIS — Z8673 Personal history of transient ischemic attack (TIA), and cerebral infarction without residual deficits: Secondary | ICD-10-CM | POA: Insufficient documentation

## 2018-10-17 ENCOUNTER — Ambulatory Visit: Payer: Self-pay | Admitting: Nurse Practitioner

## 2018-12-27 DIAGNOSIS — R0602 Shortness of breath: Secondary | ICD-10-CM | POA: Insufficient documentation

## 2018-12-27 DIAGNOSIS — R079 Chest pain, unspecified: Secondary | ICD-10-CM | POA: Insufficient documentation

## 2019-09-10 ENCOUNTER — Ambulatory Visit: Payer: Medicare Other | Admitting: Podiatry

## 2019-09-10 DIAGNOSIS — E119 Type 2 diabetes mellitus without complications: Secondary | ICD-10-CM | POA: Insufficient documentation

## 2019-12-04 ENCOUNTER — Other Ambulatory Visit: Payer: Self-pay | Admitting: Internal Medicine

## 2019-12-04 DIAGNOSIS — Z1231 Encounter for screening mammogram for malignant neoplasm of breast: Secondary | ICD-10-CM

## 2019-12-24 ENCOUNTER — Ambulatory Visit: Payer: Medicare Other

## 2019-12-24 ENCOUNTER — Other Ambulatory Visit: Payer: Self-pay

## 2019-12-24 ENCOUNTER — Ambulatory Visit
Admission: RE | Admit: 2019-12-24 | Discharge: 2019-12-24 | Disposition: A | Discharge status: Home / Self Care | Payer: Medicare Other | Source: Ambulatory Visit | Attending: Internal Medicine | Admitting: Internal Medicine

## 2019-12-24 DIAGNOSIS — Z1231 Encounter for screening mammogram for malignant neoplasm of breast: Secondary | ICD-10-CM | POA: Diagnosis not present

## 2020-09-15 ENCOUNTER — Encounter: Payer: Self-pay | Admitting: *Deleted

## 2020-09-15 ENCOUNTER — Other Ambulatory Visit: Payer: Self-pay

## 2020-09-15 DIAGNOSIS — I480 Paroxysmal atrial fibrillation: Secondary | ICD-10-CM | POA: Diagnosis present

## 2020-09-15 DIAGNOSIS — E1169 Type 2 diabetes mellitus with other specified complication: Secondary | ICD-10-CM | POA: Diagnosis present

## 2020-09-15 DIAGNOSIS — Z880 Allergy status to penicillin: Secondary | ICD-10-CM

## 2020-09-15 DIAGNOSIS — Z7984 Long term (current) use of oral hypoglycemic drugs: Secondary | ICD-10-CM

## 2020-09-15 DIAGNOSIS — Z7901 Long term (current) use of anticoagulants: Secondary | ICD-10-CM

## 2020-09-15 DIAGNOSIS — R569 Unspecified convulsions: Secondary | ICD-10-CM | POA: Diagnosis present

## 2020-09-15 DIAGNOSIS — Z803 Family history of malignant neoplasm of breast: Secondary | ICD-10-CM

## 2020-09-15 DIAGNOSIS — I129 Hypertensive chronic kidney disease with stage 1 through stage 4 chronic kidney disease, or unspecified chronic kidney disease: Secondary | ICD-10-CM | POA: Diagnosis present

## 2020-09-15 DIAGNOSIS — F419 Anxiety disorder, unspecified: Secondary | ICD-10-CM | POA: Diagnosis present

## 2020-09-15 DIAGNOSIS — Z96653 Presence of artificial knee joint, bilateral: Secondary | ICD-10-CM | POA: Diagnosis present

## 2020-09-15 DIAGNOSIS — F05 Delirium due to known physiological condition: Secondary | ICD-10-CM | POA: Diagnosis not present

## 2020-09-15 DIAGNOSIS — E860 Dehydration: Secondary | ICD-10-CM | POA: Diagnosis present

## 2020-09-15 DIAGNOSIS — Z8673 Personal history of transient ischemic attack (TIA), and cerebral infarction without residual deficits: Secondary | ICD-10-CM

## 2020-09-15 DIAGNOSIS — M797 Fibromyalgia: Secondary | ICD-10-CM | POA: Diagnosis present

## 2020-09-15 DIAGNOSIS — Z8042 Family history of malignant neoplasm of prostate: Secondary | ICD-10-CM

## 2020-09-15 DIAGNOSIS — F32A Depression, unspecified: Secondary | ICD-10-CM | POA: Diagnosis present

## 2020-09-15 DIAGNOSIS — N390 Urinary tract infection, site not specified: Secondary | ICD-10-CM | POA: Diagnosis not present

## 2020-09-15 DIAGNOSIS — G9341 Metabolic encephalopathy: Secondary | ICD-10-CM | POA: Diagnosis present

## 2020-09-15 DIAGNOSIS — B9620 Unspecified Escherichia coli [E. coli] as the cause of diseases classified elsewhere: Secondary | ICD-10-CM | POA: Diagnosis present

## 2020-09-15 DIAGNOSIS — Z79899 Other long term (current) drug therapy: Secondary | ICD-10-CM

## 2020-09-15 DIAGNOSIS — N1831 Chronic kidney disease, stage 3a: Secondary | ICD-10-CM | POA: Diagnosis present

## 2020-09-15 DIAGNOSIS — I2510 Atherosclerotic heart disease of native coronary artery without angina pectoris: Secondary | ICD-10-CM | POA: Diagnosis present

## 2020-09-15 DIAGNOSIS — Z888 Allergy status to other drugs, medicaments and biological substances status: Secondary | ICD-10-CM

## 2020-09-15 DIAGNOSIS — E785 Hyperlipidemia, unspecified: Secondary | ICD-10-CM | POA: Diagnosis present

## 2020-09-15 DIAGNOSIS — E1122 Type 2 diabetes mellitus with diabetic chronic kidney disease: Secondary | ICD-10-CM | POA: Diagnosis present

## 2020-09-15 DIAGNOSIS — G4733 Obstructive sleep apnea (adult) (pediatric): Secondary | ICD-10-CM | POA: Diagnosis present

## 2020-09-15 DIAGNOSIS — Z885 Allergy status to narcotic agent status: Secondary | ICD-10-CM

## 2020-09-15 DIAGNOSIS — Z950 Presence of cardiac pacemaker: Secondary | ICD-10-CM

## 2020-09-15 DIAGNOSIS — Z20822 Contact with and (suspected) exposure to covid-19: Secondary | ICD-10-CM | POA: Diagnosis present

## 2020-09-15 NOTE — ED Triage Notes (Signed)
Pt brought in via ems from home.  Pt has weakness and states she can not walk tonight.  Pt ambulates with a walker and could not do it tonight.  No injury to legs.  No chest pain or sob.  Pt alert  speech clear.

## 2020-09-16 ENCOUNTER — Emergency Department: Payer: Medicare Other

## 2020-09-16 ENCOUNTER — Other Ambulatory Visit: Payer: Self-pay

## 2020-09-16 ENCOUNTER — Inpatient Hospital Stay
Admission: EM | Admit: 2020-09-16 | Discharge: 2020-09-21 | DRG: 689 | Disposition: A | Discharge status: Skilled Nursing Facility | Payer: Medicare Other | Attending: Internal Medicine | Admitting: Internal Medicine

## 2020-09-16 ENCOUNTER — Observation Stay: Payer: Medicare Other

## 2020-09-16 DIAGNOSIS — Z8673 Personal history of transient ischemic attack (TIA), and cerebral infarction without residual deficits: Secondary | ICD-10-CM

## 2020-09-16 DIAGNOSIS — N390 Urinary tract infection, site not specified: Secondary | ICD-10-CM | POA: Diagnosis present

## 2020-09-16 DIAGNOSIS — N1831 Chronic kidney disease, stage 3a: Secondary | ICD-10-CM

## 2020-09-16 DIAGNOSIS — R531 Weakness: Secondary | ICD-10-CM

## 2020-09-16 DIAGNOSIS — E785 Hyperlipidemia, unspecified: Secondary | ICD-10-CM | POA: Diagnosis present

## 2020-09-16 DIAGNOSIS — E860 Dehydration: Secondary | ICD-10-CM

## 2020-09-16 DIAGNOSIS — I2510 Atherosclerotic heart disease of native coronary artery without angina pectoris: Secondary | ICD-10-CM | POA: Diagnosis present

## 2020-09-16 DIAGNOSIS — W19XXXA Unspecified fall, initial encounter: Secondary | ICD-10-CM | POA: Diagnosis present

## 2020-09-16 DIAGNOSIS — I10 Essential (primary) hypertension: Secondary | ICD-10-CM | POA: Diagnosis present

## 2020-09-16 DIAGNOSIS — F418 Other specified anxiety disorders: Secondary | ICD-10-CM | POA: Diagnosis present

## 2020-09-16 DIAGNOSIS — I480 Paroxysmal atrial fibrillation: Secondary | ICD-10-CM | POA: Diagnosis present

## 2020-09-16 DIAGNOSIS — E1129 Type 2 diabetes mellitus with other diabetic kidney complication: Secondary | ICD-10-CM | POA: Diagnosis present

## 2020-09-16 DIAGNOSIS — N183 Chronic kidney disease, stage 3 unspecified: Secondary | ICD-10-CM | POA: Diagnosis present

## 2020-09-16 DIAGNOSIS — M25569 Pain in unspecified knee: Secondary | ICD-10-CM

## 2020-09-16 DIAGNOSIS — R569 Unspecified convulsions: Secondary | ICD-10-CM

## 2020-09-16 LAB — GLUCOSE, CAPILLARY: Glucose-Capillary: 189 mg/dL — ABNORMAL HIGH (ref 70–99)

## 2020-09-16 LAB — CBC
HCT: 39.1 % (ref 36.0–46.0)
Hemoglobin: 13 g/dL (ref 12.0–15.0)
MCH: 29.8 pg (ref 26.0–34.0)
MCHC: 33.2 g/dL (ref 30.0–36.0)
MCV: 89.7 fL (ref 80.0–100.0)
Platelets: 193 10*3/uL (ref 150–400)
RBC: 4.36 MIL/uL (ref 3.87–5.11)
RDW: 13.2 % (ref 11.5–15.5)
WBC: 8.4 10*3/uL (ref 4.0–10.5)
nRBC: 0 % (ref 0.0–0.2)

## 2020-09-16 LAB — BRAIN NATRIURETIC PEPTIDE: B Natriuretic Peptide: 237.2 pg/mL — ABNORMAL HIGH (ref 0.0–100.0)

## 2020-09-16 LAB — BASIC METABOLIC PANEL
Anion gap: 7 (ref 5–15)
BUN: 8 mg/dL (ref 8–23)
CO2: 27 mmol/L (ref 22–32)
Calcium: 8.6 mg/dL — ABNORMAL LOW (ref 8.9–10.3)
Chloride: 106 mmol/L (ref 98–111)
Creatinine, Ser: 1.12 mg/dL — ABNORMAL HIGH (ref 0.44–1.00)
GFR, Estimated: 52 mL/min — ABNORMAL LOW (ref >60)
Glucose, Bld: 236 mg/dL — ABNORMAL HIGH (ref 70–99)
Potassium: 4.2 mmol/L (ref 3.5–5.1)
Sodium: 140 mmol/L (ref 135–145)

## 2020-09-16 LAB — URINALYSIS, COMPLETE (UACMP) WITH MICROSCOPIC
Bacteria, UA: NONE SEEN
Bilirubin Urine: NEGATIVE
Glucose, UA: NEGATIVE mg/dL
Hgb urine dipstick: NEGATIVE
Ketones, ur: NEGATIVE mg/dL
Leukocytes,Ua: NEGATIVE
Nitrite: POSITIVE — AB
Protein, ur: NEGATIVE mg/dL
Specific Gravity, Urine: 1.004 — ABNORMAL LOW (ref 1.005–1.030)
pH: 5 (ref 5.0–8.0)

## 2020-09-16 LAB — RESP PANEL BY RT-PCR (FLU A&B, COVID) ARPGX2
Influenza A by PCR: NEGATIVE
Influenza B by PCR: NEGATIVE
SARS Coronavirus 2 by RT PCR: NEGATIVE

## 2020-09-16 LAB — CBG MONITORING, ED
Glucose-Capillary: 104 mg/dL — ABNORMAL HIGH (ref 70–99)
Glucose-Capillary: 166 mg/dL — ABNORMAL HIGH (ref 70–99)

## 2020-09-16 LAB — TROPONIN I (HIGH SENSITIVITY)
Troponin I (High Sensitivity): 10 ng/L (ref <18)
Troponin I (High Sensitivity): 9 ng/L (ref <18)

## 2020-09-16 LAB — CK: Total CK: 56 U/L (ref 38–234)

## 2020-09-16 MED ORDER — ZOLPIDEM TARTRATE 5 MG PO TABS: 10.0000 mg | ORAL_TABLET | Freq: Every day | ORAL | Status: DC

## 2020-09-16 MED ORDER — DULOXETINE HCL 60 MG PO CPEP
60.0000 mg | ORAL_CAPSULE | Freq: Every day | ORAL | Status: DC
  Administered 2020-09-16 – 2020-09-21 (×6): 60 mg via ORAL
  Filled 2020-09-16 (×6): qty 1

## 2020-09-16 MED ORDER — PREGABALIN 75 MG PO CAPS
150.0000 mg | ORAL_CAPSULE | Freq: Two times a day (BID) | ORAL | Status: DC
  Administered 2020-09-16 – 2020-09-20 (×9): 150 mg via ORAL
  Filled 2020-09-16 (×9): qty 2

## 2020-09-16 MED ORDER — HYDRALAZINE HCL 20 MG/ML IJ SOLN
5.0000 mg | INTRAMUSCULAR | Status: DC | PRN
  Filled 2020-09-16: qty 0.25

## 2020-09-16 MED ORDER — LORAZEPAM 2 MG/ML IJ SOLN
1.0000 mg | INTRAMUSCULAR | Status: DC | PRN
Start: 2020-09-16 — End: 2020-09-21

## 2020-09-16 MED ORDER — APIXABAN 5 MG PO TABS
5.0000 mg | ORAL_TABLET | Freq: Two times a day (BID) | ORAL | Status: DC
  Administered 2020-09-16 – 2020-09-21 (×11): 5 mg via ORAL
  Filled 2020-09-16 (×11): qty 1

## 2020-09-16 MED ORDER — ONDANSETRON HCL 4 MG/2ML IJ SOLN: 4.0000 mg | Freq: Three times a day (TID) | INTRAMUSCULAR | Status: DC | PRN

## 2020-09-16 MED ORDER — ACETAMINOPHEN 325 MG PO TABS
650.0000 mg | ORAL_TABLET | Freq: Once | ORAL | Status: AC
  Administered 2020-09-16: 650 mg via ORAL
  Filled 2020-09-16: qty 2

## 2020-09-16 MED ORDER — LISINOPRIL 5 MG PO TABS
5.0000 mg | ORAL_TABLET | Freq: Every day | ORAL | Status: DC
  Administered 2020-09-16 – 2020-09-21 (×6): 5 mg via ORAL
  Filled 2020-09-16 (×6): qty 1

## 2020-09-16 MED ORDER — SODIUM CHLORIDE 0.9% FLUSH
10.0000 mL | Freq: Two times a day (BID) | INTRAVENOUS | Status: DC
  Administered 2020-09-16 – 2020-09-21 (×10): 10 mL via INTRAVENOUS

## 2020-09-16 MED ORDER — ALBUTEROL SULFATE (2.5 MG/3ML) 0.083% IN NEBU: 3.0000 mL | INHALATION_SOLUTION | RESPIRATORY_TRACT | Status: DC | PRN

## 2020-09-16 MED ORDER — ZOLPIDEM TARTRATE 5 MG PO TABS
5.0000 mg | ORAL_TABLET | Freq: Every day | ORAL | Status: DC
  Administered 2020-09-16 – 2020-09-20 (×5): 5 mg via ORAL
  Filled 2020-09-16 (×5): qty 1

## 2020-09-16 MED ORDER — LORAZEPAM 0.5 MG PO TABS: 0.5000 mg | ORAL_TABLET | Freq: Two times a day (BID) | ORAL | Status: DC | PRN

## 2020-09-16 MED ORDER — SODIUM CHLORIDE 0.9 % IV SOLN
1.0000 g | Freq: Once | INTRAVENOUS | Status: AC
  Administered 2020-09-16: 1 g via INTRAVENOUS
  Filled 2020-09-16: qty 10

## 2020-09-16 MED ORDER — SODIUM CHLORIDE 0.9 % IV SOLN
2.0000 g | INTRAVENOUS | Status: DC
  Administered 2020-09-17: 2 g via INTRAVENOUS
  Filled 2020-09-16: qty 2

## 2020-09-16 MED ORDER — COLCHICINE 0.6 MG PO TABS
0.6000 mg | ORAL_TABLET | Freq: Every day | ORAL | Status: DC
  Administered 2020-09-17: 0.6 mg via ORAL
  Filled 2020-09-16 (×2): qty 1

## 2020-09-16 MED ORDER — SODIUM CHLORIDE 0.9 % IV BOLUS
1000.0000 mL | Freq: Once | INTRAVENOUS | Status: AC
  Administered 2020-09-16: 03:00:00 1000 mL via INTRAVENOUS

## 2020-09-16 MED ORDER — DULOXETINE HCL 20 MG PO CPEP
20.0000 mg | ORAL_CAPSULE | Freq: Every day | ORAL | Status: DC
  Administered 2020-09-17: 20 mg via ORAL
  Filled 2020-09-16 (×2): qty 1

## 2020-09-16 MED ORDER — ACETAMINOPHEN 160 MG/5ML PO SOLN
650.0000 mg | Freq: Four times a day (QID) | ORAL | Status: DC | PRN
  Filled 2020-09-16: qty 20.3

## 2020-09-16 MED ORDER — INSULIN ASPART 100 UNIT/ML IJ SOLN
0.0000 [IU] | Freq: Three times a day (TID) | INTRAMUSCULAR | Status: DC
  Administered 2020-09-16: 2 [IU] via SUBCUTANEOUS
  Administered 2020-09-17: 1 [IU] via SUBCUTANEOUS
  Administered 2020-09-17 (×2): 2 [IU] via SUBCUTANEOUS
  Administered 2020-09-18: 1 [IU] via SUBCUTANEOUS
  Administered 2020-09-18: 5 [IU] via SUBCUTANEOUS
  Administered 2020-09-18 – 2020-09-20 (×5): 2 [IU] via SUBCUTANEOUS
  Administered 2020-09-20: 7 [IU] via SUBCUTANEOUS
  Administered 2020-09-20 – 2020-09-21 (×3): 3 [IU] via SUBCUTANEOUS
  Filled 2020-09-16 (×14): qty 1

## 2020-09-16 MED ORDER — INSULIN ASPART 100 UNIT/ML IJ SOLN
0.0000 [IU] | Freq: Every day | INTRAMUSCULAR | Status: DC
  Administered 2020-09-19 – 2020-09-20 (×2): 2 [IU] via SUBCUTANEOUS
  Filled 2020-09-16 (×2): qty 1

## 2020-09-16 MED ORDER — SODIUM CHLORIDE 0.9 % IV SOLN: 1.0000 g | Freq: Once | INTRAVENOUS | Status: DC

## 2020-09-16 MED ORDER — LOPERAMIDE HCL 1 MG/7.5ML PO SUSP
1.0000 mg | Freq: Every day | ORAL | Status: DC | PRN
  Filled 2020-09-16: qty 7.5

## 2020-09-16 MED ORDER — ATORVASTATIN CALCIUM 20 MG PO TABS
20.0000 mg | ORAL_TABLET | Freq: Every day | ORAL | Status: DC
  Administered 2020-09-16 – 2020-09-21 (×6): 20 mg via ORAL
  Filled 2020-09-16 (×6): qty 1

## 2020-09-16 MED ORDER — OXYCODONE-ACETAMINOPHEN 5-325 MG PO TABS
1.0000 | ORAL_TABLET | Freq: Four times a day (QID) | ORAL | Status: DC | PRN
  Administered 2020-09-16 – 2020-09-20 (×9): 1 via ORAL
  Filled 2020-09-16 (×9): qty 1

## 2020-09-16 MED ORDER — LEVETIRACETAM 500 MG PO TABS
1000.0000 mg | ORAL_TABLET | Freq: Two times a day (BID) | ORAL | Status: DC
  Administered 2020-09-16 – 2020-09-21 (×11): 1000 mg via ORAL
  Filled 2020-09-16 (×12): qty 2

## 2020-09-16 NOTE — ED Notes (Signed)
Pt stated when performing in and out cath that she was unable to move knees and explained that ems came the previous night to help her and she had injury to her legs and has since been unable to move them. Pt reports that before tonight she usually gets around on walker fairly well. Pt and family member at bedside concerned about "something being broke." EDP made aware.

## 2020-09-16 NOTE — Evaluation (Signed)
Occupational Therapy Evaluation Patient Details Name: KYNLI CHOU MRN: [id number] DOB: 1945/09/09 Today's Date: 09/16/2020    History of Present Illness ELLICE BOULTINGHOUSE is a 75 y.o. female with medical history significant of hypertension, hyperlipidemia, diabetes mellitus, stroke, seizure, GERD, gout, depression with anxiety, atrial fibrillation on Eliquis, CAD, fibromyalgia, ICH 2014, OSA not on CPAP, pacemaker placement, CAD-3A, who presents with generalized weakness and fall.   Clinical Impression   Patient presenting with decreased I in self care, balance, functional mobility/transfer, endurance, and safety awareness. Patient reports living at home with husband and being independent with use of RW PTA. However, pt does reports increased fatigue and sitting on commode at home and unable to stand from toilet. EMS arrived to assist and pt reports R LE getting hurt.  Patient currently needing max - total A for bed mobility. She needed max A to attempt standing with +2 HHA. Pt unable to bear weight through R LE and reports 11/10 pain. Pt very tearful with therapist recommendation and showing limited insight to deficits. Patient will benefit from acute OT to increase overall independence in the areas of ADLs, functional mobility, and safety awareness in order to safely discharge to next venue of care.     Follow Up Recommendations  SNF    Equipment Recommendations  Other (comment) (defer to next venue of care)       Precautions / Restrictions Precautions Precautions: Fall      Mobility Bed Mobility Overal bed mobility: Needs Assistance Bed Mobility: Supine to Sit;Sit to Supine     Supine to sit: Max assist Sit to supine: Max assist;Total assist   General bed mobility comments: Pt able to move L LE towards EOB but needing assistance for R LE and trunk support as well as "squaring up" in bed.    Transfers Overall transfer level: Needs assistance Equipment used: 2 person hand held  assist Transfers: Sit to/from Stand Sit to Stand: Max assist         General transfer comment: unable to come to full stand and place weight thru R LE    Balance Overall balance assessment: Needs assistance   Sitting balance-Leahy Scale: Fair     Standing balance support: Bilateral upper extremity supported Standing balance-Leahy Scale: Poor                             ADL either performed or assessed with clinical judgement   ADL Overall ADL's : Needs assistance/impaired                           Toilet Transfer Details (indicate cue type and reason): unable to perform           General ADL Comments: Pt could likely perform UB self care with set up A as well as grooming. Pt unable to full stand and tolerate weight bearing thru R LE this session.     Vision Baseline Vision/History: Wears glasses Wears Glasses: Reading only Patient Visual Report: No change from baseline              Pertinent Vitals/Pain Pain Assessment: 0-10 Pain Score: 10-Worst pain ever Pain Descriptors / Indicators: Grimacing;Guarding;Crying;Sharp Pain Intervention(s): Limited activity within patient's tolerance;Monitored during session;Repositioned;Premedicated before session     Hand Dominance Right   Extremity/Trunk Assessment Upper Extremity Assessment Upper Extremity Assessment: Generalized weakness   Lower Extremity Assessment Lower Extremity Assessment: Generalized weakness  Communication Communication Communication: No difficulties   Cognition Arousal/Alertness: Awake/alert Behavior During Therapy: WFL for tasks assessed/performed Overall Cognitive Status: Within Functional Limits for tasks assessed                                                Home Living Family/patient expects to be discharged to:: Private residence Living Arrangements: Spouse/significant other Available Help at Discharge: Family;Available 24  hours/day Type of Home: House Home Access: Ramped entrance     Home Layout: One level     Bathroom Shower/Tub: Teacher, early years/pre: Handicapped height     Home Equipment: Environmental consultant - 2 wheels;Cane - single point          Prior Functioning/Environment Level of Independence: Independent with assistive device(s)        Comments: Pt reports performing sink bathing and able to dress herself independently at baseline. Pt reports use of RW.        OT Problem List: Decreased strength;Decreased activity tolerance;Impaired balance (sitting and/or standing);Decreased safety awareness;Pain;Decreased range of motion      OT Treatment/Interventions: Self-care/ADL training;Manual therapy;Therapeutic exercise;Modalities;Patient/family education;Energy conservation;Therapeutic activities;DME and/or AE instruction;Balance training    OT Goals(Current goals can be found in the care plan section) Acute Rehab OT Goals Patient Stated Goal: to go home OT Goal Formulation: With patient/family Time For Goal Achievement: 09/30/20 Potential to Achieve Goals: Good  OT Frequency: Min 2X/week   Barriers to D/C: Other (comment)  family is available but unsure if they could provide this level of assistance and not recommended for safety.          AM-PAC OT "6 Clicks" Daily Activity     Outcome Measure Help from another person eating meals?: None Help from another person taking care of personal grooming?: None Help from another person toileting, which includes using toliet, bedpan, or urinal?: Total Help from another person bathing (including washing, rinsing, drying)?: A Lot Help from another person to put on and taking off regular upper body clothing?: A Little Help from another person to put on and taking off regular lower body clothing?: Total 6 Click Score: 15   End of Session Nurse Communication: Mobility status  Activity Tolerance: Patient limited by pain Patient left: in  bed;with call bell/phone within reach;with family/visitor present  OT Visit Diagnosis: Unsteadiness on feet (R26.81);Repeated falls (R29.6);Muscle weakness (generalized) (M62.81)                Time: 2774-1287 OT Time Calculation (min): 25 min Charges:  OT General Charges $OT Visit: 1 Visit OT Evaluation $OT Eval Moderate Complexity: 1 Mod OT Treatments $Therapeutic Activity: 8-22 mins Darleen Crocker, MS, OTR/L , CBIS ascom [phone]  09/16/20, 4:19 PM

## 2020-09-16 NOTE — ED Provider Notes (Signed)
[facility] Emergency Department Provider Note   ____________________________________________   Event Date/Time   First MD Initiated Contact with Patient 09/16/20 [phone]     (approximate)  I have reviewed the triage vital signs and the nursing notes.   HISTORY  Chief Complaint Weakness    HPI Traci Carter is a 75 y.o. female brought to the ED via EMS from home with a chief complaint of generalized weakness and inability to ambulate.  Patient usually ambulates with a walker and tonight she was so generally weak that she could not stand.  Denies fever, cough, chest pain, shortness of breath, abdominal pain, nausea, vomiting, dysuria or dizziness.  Patient is vaccinated and boosted against COVID-19.     Past Medical History:  Diagnosis Date   Afib ([place])    Diabetes mellitus without complication ([place])    Dysrhythmia    Hypertension    Stroke ([place]) 07/21/12   posterior superior left frontal lobe parenchymal hemorrhage     Patient Active Problem List   Diagnosis Date Noted   Diabetes mellitus type 2, uncomplicated ([place]) 75/91/6384   Chest pain with low risk for cardiac etiology 12/27/2018   SOB (shortness of breath) on exertion 12/27/2018   Paroxysmal atrial fibrillation ([place]) 10/16/2018   Hyperlipidemia associated with type 2 diabetes mellitus ([place]) 10/16/2018   Pacemaker 10/16/2018   History of CVA (cerebrovascular accident) 10/16/2018   OSA (obstructive sleep apnea) 10/16/2018   Fibromyalgia 10/16/2018   CAD (coronary artery disease) 10/16/2018   Diarrhea 06/09/2017   Hyperlipemia 08/15/2013   ICH (intracerebral hemorrhage) ([place]) 07/21/2012   Diabetes ([place]) 07/21/2012   Essential hypertension 07/21/2012   CVA (cerebral vascular accident) ([place]) 07/20/2012    Past Surgical History:  Procedure Laterality Date   ABDOMINAL HYSTERECTOMY     CARPAL TUNNEL RELEASE     COLONOSCOPY  01/02/2009   Dr Bary Castilla diverticulosis.   COLONOSCOPY WITH  PROPOFOL N/A 06/28/2017   Procedure: COLONOSCOPY WITH PROPOFOL;  Surgeon: Robert Bellow, MD;  Location: [facility] ENDOSCOPY;  Service: Endoscopy;  Laterality: N/A;   ELBOW SURGERY     INSERT / REPLACE / REMOVE PACEMAKER     Dr Saralyn Pilar   JOINT REPLACEMENT  2005   knee   SHOULDER ARTHROSCOPY      Prior to Admission medications   Medication Sig Start Date End Date Taking? Authorizing Provider  albuterol (PROAIR HFA) 108 (90 Base) MCG/ACT inhaler  09/10/13   [provider]  apixaban (ELIQUIS) 5 MG TABS tablet Take 1 tablet (5 mg total) by mouth 2 (two) times daily. 07/01/17   Robert Bellow, MD  atorvastatin (LIPITOR) 20 MG tablet Take 20 mg by mouth daily.    [provider]  bismuth subsalicylate (PEPTO BISMOL) 262 MG/15ML suspension Take 30 mLs by mouth daily.    [provider]  busPIRone (BUSPAR) 10 MG tablet  08/08/13   [provider]  busPIRone (BUSPAR) 30 MG tablet Take 30 mg by mouth 2 (two) times daily. 06/28/19   [provider]  citalopram (CELEXA) 10 MG tablet TAKE 2 TABLETS BY MOUTH EVERY DAY 09/13/13   Garvin Fila, MD  clindamycin (CLEOCIN) 300 MG capsule Take 300 mg by mouth every 6 (six) hours. 05/22/19   [provider]  colchicine 0.6 MG tablet Take 1 tablet twice daily while under a gout flare. 01/09/17   [provider]  escitalopram (LEXAPRO) 20 MG tablet Take 20 mg by mouth daily. 08/18/19   [provider]  famotidine (PEPCID) 20 MG tablet Take 20 mg by mouth daily. 07/27/19   [provider]  furosemide (LASIX) 20 MG tablet Take 1 tablet (20 mg total) by mouth daily. 10/18/12   Philmore Pali, NP  glipiZIDE (GLUCOTROL) 5 MG tablet  05/30/14   [provider]  levETIRAcetam (KEPPRA) 1000 MG tablet Take 1 tablet (1,000 mg total) by mouth 2 (two) times daily. 10/18/12   Philmore Pali, NP  lisinopril (PRINIVIL,ZESTRIL) 5 MG tablet Take 1 tablet (5 mg total) by mouth daily. 10/18/12   Philmore Pali, NP  Loperamide HCl (IMODIUM A-D) 1 MG/7.5ML LIQD Take by mouth daily.    [provider]  LORazepam (ATIVAN) 1 MG tablet TK 1 T PO BID 10/28/15   [provider]  montelukast (SINGULAIR) 10 MG tablet  11/07/13   [provider]  pregabalin (LYRICA) 50 MG capsule Take 50 mg by mouth at bedtime. 08/17/19   [provider]  zolpidem (AMBIEN) 10 MG tablet Take 10 mg by mouth at bedtime.    [provider]    Allergies Codeine, Lanoxin [digoxin], Zoloft [sertraline hcl], and Penicillins  Family History  Problem Relation Age of Onset   Breast cancer Mother    Prostate cancer Father     Social History Social History   Tobacco Use   Smoking status: Never   Smokeless tobacco: Never  Vaping Use   Vaping Use: Never used  Substance Use Topics   Alcohol use: Not Currently    Alcohol/week: 1.0 standard drink    Types: 1 Cans of beer per week    Comment: rare   Drug use: No    Review of Systems  Constitutional: Positive for generalized weakness.  No fever/chills Eyes: No visual changes. ENT: No sore throat. Cardiovascular: Denies chest pain. Respiratory: Denies shortness of breath. Gastrointestinal: No abdominal pain.  No nausea, no vomiting.  No diarrhea.  No constipation. Genitourinary: Negative for dysuria. Musculoskeletal: Negative for back pain. Skin: Negative for rash. Neurological: Negative for headaches, focal weakness or numbness.   ____________________________________________   PHYSICAL EXAM:  VITAL SIGNS: ED Triage Vitals  Enc Vitals Group     BP 09/15/20 2351 126/71     Pulse Rate 09/15/20 2351 80     Resp 09/15/20 2351 18     Temp 09/15/20 2351 98.6 F (37 C)     Temp Source 09/15/20 2351 Oral     SpO2 09/15/20 2351 94 %     Weight 09/15/20 2352 252 lb (114.3 kg)     Height 09/15/20 2352 5\' 4"  (1.626 m)     Head Circumference --      Peak Flow --      Pain Score 09/15/20 2352 8     Pain Loc --      Pain Edu?  --      Excl. in [place]? --     Constitutional: Alert and oriented.  Elderly appearing and in no acute distress. Eyes: Conjunctivae are normal. PERRL. EOMI. Head: Atraumatic. Nose: No congestion/rhinnorhea. Mouth/Throat: Mucous membranes are mildly dry. Neck: No stridor.   Cardiovascular: Normal rate, regular rhythm. Grossly normal heart sounds.  Good peripheral circulation. Respiratory: Normal respiratory effort.  No retractions. Lungs CTAB. Gastrointestinal: Soft and nontender to light or deep palpation. No distention. No abdominal bruits. No CVA tenderness. Musculoskeletal: No lower extremity tenderness nor edema.  No joint effusions. Neurologic:  Normal speech and language. No gross focal neurologic deficits are  appreciated.  Skin:  Skin is warm, dry and intact. No rash noted.  No petechiae. Psychiatric: Mood and affect are normal. Speech and behavior are normal.  ____________________________________________   LABS (all labs ordered are listed, but only abnormal results are displayed)  Labs Reviewed  BASIC METABOLIC PANEL - Abnormal; Notable for the following components:      Result Value   Glucose, Bld 236 (*)    Creatinine, Ser 1.12 (*)    Calcium 8.6 (*)    GFR, Estimated 52 (*)    All other components within normal limits  URINALYSIS, COMPLETE (UACMP) WITH MICROSCOPIC - Abnormal; Notable for the following components:   Color, Urine YELLOW (*)    APPearance CLEAR (*)    Specific Gravity, Urine 1.004 (*)    Nitrite POSITIVE (*)    All other components within normal limits  RESP PANEL BY RT-PCR (FLU A&B, COVID) ARPGX2  URINE CULTURE  CBC  TROPONIN I (HIGH SENSITIVITY)  TROPONIN I (HIGH SENSITIVITY)   ____________________________________________  EKG  ED ECG REPORT I, Malorie Bigford J, the attending physician, personally viewed and interpreted this ECG.   Date: 09/16/2020  EKG Time: 0006  Rate: 79  Rhythm: atrial fibrillation, rate 79  Axis: LAD  Intervals:right  bundle branch block  ST&T Change: Nonspecific  ____________________________________________  RADIOLOGY I, Karley Pho J, personally viewed and evaluated these images (plain radiographs) as part of my medical decision making, as well as reviewing the written report by the radiologist.  ED MD interpretation: No acute cardiopulmonary process; no fracture of pelvis or bilateral knees, small knee effusions noted bilaterally  Official radiology report(s): DG Chest 2 View  Result Date: 09/16/2020 CLINICAL DATA:  Weakness EXAM: CHEST - 2 VIEW COMPARISON:  08/02/2012 FINDINGS: Left pacer remains in place, unchanged. Cardiomegaly. Lingular scarring or atelectasis. Right lung clear. No effusions or acute bony abnormality. Aortic atherosclerosis. IMPRESSION: Cardiomegaly.  No active disease. Lingular scarring or atelectasis. Electronically Signed   By: Rolm Baptise M.D.   On: 09/16/2020 00:34   DG Pelvis 1-2 Views  Result Date: 09/16/2020 CLINICAL DATA:  Fall EXAM: PELVIS - 1-2 VIEW COMPARISON:  None. FINDINGS: There is no evidence of pelvic fracture or diastasis. No pelvic bone lesions are seen. IMPRESSION: Negative. Electronically Signed   By: Ulyses Jarred M.D.   On: 09/16/2020 03:54   DG Knee Complete 4 Views Left  Result Date: 09/16/2020 CLINICAL DATA:  Fall EXAM: LEFT KNEE - COMPLETE 4+ VIEW COMPARISON:  None. FINDINGS: Small suprapatellar effusion. Total knee arthroplasty without adverse features. No fracture or dislocation. Vascular calcifications. IMPRESSION: Small suprapatellar effusion without fracture or dislocation. Total knee arthroplasty without adverse features. Electronically Signed   By: Ulyses Jarred M.D.   On: 09/16/2020 03:56   DG Knee Complete 4 Views Right  Result Date: 09/16/2020 CLINICAL DATA:  Fall EXAM: RIGHT KNEE - COMPLETE 4+ VIEW COMPARISON:  None. FINDINGS: Right total knee arthroplasty. No fracture or dislocation. Small suprapatellar effusion. IMPRESSION: No fracture or  dislocation of the right knee. Small suprapatellar effusion. Electronically Signed   By: Ulyses Jarred M.D.   On: 09/16/2020 03:55    ____________________________________________   PROCEDURES  Procedure(s) performed (including Critical Care):  .1-3 Lead EKG Interpretation  Date/Time: 09/16/2020 2:22 AM Performed by: Paulette Blanch, MD Authorized by: Paulette Blanch, MD     Interpretation: abnormal     ECG rate:  79   ECG rate assessment: normal     Rhythm: atrial fibrillation  Ectopy: none     Conduction: normal   Comments:     Patient placed on cardiac monitor to evaluate for arrhythmias   ____________________________________________   INITIAL IMPRESSION / ASSESSMENT AND PLAN / ED COURSE  As part of my medical decision making, I reviewed the following data within the [place] History obtained from family, Nursing notes reviewed and incorporated, Labs reviewed, EKG interpreted, Old chart reviewed, Radiograph reviewed, Discussed with admitting physician, and Notes from prior ED visits     75 year old female who presents with generalized weakness and inability to stand or ambulate secondary to weakness.  Differential diagnosis includes but is not limited to ACS, infectious, metabolic etiologies, etc.  Laboratory results demonstrate AKI.  Will obtain UA, respiratory panel.  Initiate IV fluid hydration.  Anticipate hospitalization.  Clinical Course as of 09/16/20 0514  Wed Sep 16, 2020  0310 Nurse mentioned it was painful for patient's legs to be positioned during I&O cath.  Reportedly patient fell yesterday and was complaining of bilateral knee pain.  Will image pelvis and bilateral knees. [JS]  4276 X-rays unremarkable.  Have discussed case with hospitalist services for admission. [JS]    Clinical Course User Index [JS] Paulette Blanch, MD     ____________________________________________   FINAL CLINICAL IMPRESSION(S) / ED DIAGNOSES  Final diagnoses:   Generalized weakness  Dehydration  Lower urinary tract infectious disease     ED Discharge Orders     None        Note:  This document was prepared using Dragon voice recognition software and may include unintentional dictation errors.    Paulette Blanch, MD 09/16/20 [phone]

## 2020-09-16 NOTE — ED Notes (Signed)
Patient to inpatient unit with transport.

## 2020-09-16 NOTE — Progress Notes (Signed)
PHARMACIST - PHYSICIAN ORDER COMMUNICATION  CONCERNING: P&T Medication Policy for Ambien (Zolpidem)  DESCRIPTION:  This patient's order for:  Ambien (Zolpidem) 10mg  QHS  has been noted.  For female patients and patients age 73 years or older, dosage of zolpidem automatically limited to 5 mg.  ACTION TAKEN: The pharmacy department has adjusted the dose of Zolpidem to 5mg  per policy.   Pernell Dupre, PharmD, BCPS Clinical Pharmacist 09/16/2020 11:03 AM

## 2020-09-16 NOTE — ED Notes (Signed)
Pt in x-ray at this time

## 2020-09-16 NOTE — H&P (Signed)
History and Physical    Traci Carter WNI:[id number] DOB: April 02, 1946 DOA: 09/16/2020  Referring MD/NP/PA:   PCP: Albina Billet, MD   Patient coming from:  The patient is coming from home.    Chief Complaint: Generalized weakness and fall  HPI: Traci Carter is a 76 y.o. female with medical history significant of hypertension, hyperlipidemia, diabetes mellitus, stroke, seizure, GERD, gout, depression with anxiety, atrial fibrillation on Eliquis, CAD, fibromyalgia, ICH 2014, OSA not on CPAP, pacemaker placement, CAD-3A, who presents with generalized weakness and fall.  Patient states that she has been feeling weak for more than 2 days, which has been progressively worsening.  She states that normally she walks using walker, but now she can barely walking.  No unilateral numbness or tingling in extremities.  No facial droop or slurred speech.  States that she has intermittent mild dry cough, but no chest pain, shortness breath, fever or chills.  Denies nausea, vomiting, diarrhea and abdominal pain.  Patient states that she fell yesterday afternoon.  She states that she has bilateral knee replacement and chronic knee pain, but her knee pain has been worsening after fall.  The right knee is worse than the left.  Patient has increased urinary frequency, but no dysuria or burning on urination.  She states that she is taking Eliquis, last dose was yesterday.  ED Course: pt was found to have WBC 8.4, troponin level 9, 10, urinalysis (clear appearance, negative leukocyte, negative bacteria, WBC 0-5, with positive nitrite), renal function slightly worsening than baseline, temperature normal, blood pressure 131/96, heart rate 80, RR 18, oxygen saturation 94% on room air.  Chest x-ray showed cardiomegaly without infiltration.  X-ray of pelvis and right knee negative for acute issues.  X-ray of left knee showed small suprapatellar effusion without acute bony fracture.  Patient is placed MedSurg bed for  observation   Review of Systems:   General: no fevers, chills, no body weight gain, has fatigue HEENT: no blurry vision, hearing changes or sore throat Respiratory: no dyspnea, has mild dry coughing, no wheezing CV: no chest pain, no palpitations GI: no nausea, vomiting, abdominal pain, diarrhea, constipation GU: no dysuria, burning on urination, increased urinary frequency, hematuria  Ext: no leg edema Neuro: no unilateral weakness, numbness, or tingling, no vision change or hearing loss. Has fall. Skin: no rash, no skin tear. MSK: has bilateral knee pain Heme: No easy bruising.  Travel history: No recent long distant travel.  Allergy:  Allergies  Allergen Reactions   Codeine Other (See Comments)    GI Distress   Lanoxin [Digoxin]    Zoloft [Sertraline Hcl]    Penicillins Rash    Past Medical History:  Diagnosis Date   Afib ([place])    Diabetes mellitus without complication ([place])    Dysrhythmia    Hypertension    Stroke ([place]) 07/21/12   posterior superior left frontal lobe parenchymal hemorrhage     Past Surgical History:  Procedure Laterality Date   ABDOMINAL HYSTERECTOMY     CARPAL TUNNEL RELEASE     COLONOSCOPY  01/02/2009   Dr Bary Castilla diverticulosis.   COLONOSCOPY WITH PROPOFOL N/A 06/28/2017   Procedure: COLONOSCOPY WITH PROPOFOL;  Surgeon: Robert Bellow, MD;  Location: [facility] ENDOSCOPY;  Service: Endoscopy;  Laterality: N/A;   ELBOW SURGERY     INSERT / REPLACE / REMOVE PACEMAKER     Dr Saralyn Pilar   JOINT REPLACEMENT  2005   knee   SHOULDER ARTHROSCOPY  Social History:  reports that she has never smoked. She has never used smokeless tobacco. She reports previous alcohol use of about 1.0 standard drink of alcohol per week. She reports that she does not use drugs.  Family History:  Family History  Problem Relation Age of Onset   Breast cancer Mother    Prostate cancer Father      Prior to Admission medications   Medication Sig Start Date End Date  Taking? Authorizing Provider  albuterol (PROAIR HFA) 108 (90 Base) MCG/ACT inhaler  09/10/13   [provider]  apixaban (ELIQUIS) 5 MG TABS tablet Take 1 tablet (5 mg total) by mouth 2 (two) times daily. 07/01/17   Robert Bellow, MD  atorvastatin (LIPITOR) 20 MG tablet Take 20 mg by mouth daily.    [provider]  bismuth subsalicylate (PEPTO BISMOL) 262 MG/15ML suspension Take 30 mLs by mouth daily.    [provider]  busPIRone (BUSPAR) 10 MG tablet  08/08/13   [provider]  busPIRone (BUSPAR) 30 MG tablet Take 30 mg by mouth 2 (two) times daily. 06/28/19   [provider]  citalopram (CELEXA) 10 MG tablet TAKE 2 TABLETS BY MOUTH EVERY DAY 09/13/13   Garvin Fila, MD  clindamycin (CLEOCIN) 300 MG capsule Take 300 mg by mouth every 6 (six) hours. 05/22/19   [provider]  colchicine 0.6 MG tablet Take 1 tablet twice daily while under a gout flare. 01/09/17   [provider]  DULoxetine (CYMBALTA) 60 MG capsule Take 60 mg by mouth daily. 09/08/20   [provider]  escitalopram (LEXAPRO) 20 MG tablet Take 20 mg by mouth daily. 08/18/19   [provider]  famotidine (PEPCID) 20 MG tablet Take 20 mg by mouth daily. 07/27/19   [provider]  furosemide (LASIX) 20 MG tablet Take 1 tablet (20 mg total) by mouth daily. 10/18/12   Philmore Pali, NP  glipiZIDE (GLUCOTROL) 5 MG tablet  05/30/14   [provider]  levETIRAcetam (KEPPRA) 1000 MG tablet Take 1 tablet (1,000 mg total) by mouth 2 (two) times daily. 10/18/12   Philmore Pali, NP  lisinopril (PRINIVIL,ZESTRIL) 5 MG tablet Take 1 tablet (5 mg total) by mouth daily. 10/18/12   Philmore Pali, NP  Loperamide HCl (IMODIUM A-D) 1 MG/7.5ML LIQD Take by mouth daily.    [provider]  LORazepam (ATIVAN) 1 MG tablet TK 1 T PO BID 10/28/15   [provider]  montelukast (SINGULAIR) 10 MG tablet  11/07/13   [provider]  pregabalin  (LYRICA) 150 MG capsule Take 150 mg by mouth 2 (two) times daily. 08/12/20   [provider]  pregabalin (LYRICA) 50 MG capsule Take 50 mg by mouth at bedtime. 08/17/19   [provider]  zolpidem (AMBIEN) 10 MG tablet Take 10 mg by mouth at bedtime.    [provider]    Physical Exam: Vitals:   09/16/20 0730 09/16/20 0800 09/16/20 0830 09/16/20 0900  BP: (!) 145/91 (!) 142/92 (!) 136/94 (!) 150/81  Pulse:    68  Resp: 19 17 16 16   Temp:      TempSrc:      SpO2:    97%  Weight:      Height:       General: Not in acute distress HEENT:       Eyes: PERRL, EOMI, no scleral icterus.       ENT: No discharge from the  ears and nose, no pharynx injection, no tonsillar enlargement.        Neck: No JVD, no bruit, no mass felt. Heme: No neck lymph node enlargement. Cardiac: S1/S2, RRR, No murmurs, No gallops or rubs. Respiratory: No rales, wheezing, rhonchi or rubs. GI: Soft, nondistended, nontender, no rebound pain, no organomegaly, BS present. GU: No hematuria Ext: No pitting leg edema bilaterally. 1+DP/PT pulse bilaterally. Musculoskeletal: has tenderness in both knees, no swelling or erythema Skin: No rashes.  Neuro: Alert, oriented X3, cranial nerves II-XII grossly intact, moves all extremities normally.  Psych: Patient is not psychotic, no suicidal or hemocidal ideation.  Labs on Admission: I have personally reviewed following labs and imaging studies  CBC: Recent Labs  Lab 09/15/20 2355  WBC 8.4  HGB 13.0  HCT 39.1  MCV 89.7  PLT 250   Basic Metabolic Panel: Recent Labs  Lab 09/15/20 2355  NA 140  K 4.2  CL 106  CO2 27  GLUCOSE 236*  BUN 8  CREATININE 1.12*  CALCIUM 8.6*   GFR: Estimated Creatinine Clearance: 55.4 mL/min (A) (by C-G formula based on SCr of 1.12 mg/dL (H)). Liver Function Tests: No results for input(s): AST, ALT, ALKPHOS, BILITOT, PROT, ALBUMIN in the last 168 hours. No results for input(s): LIPASE, AMYLASE in the last  168 hours. No results for input(s): AMMONIA in the last 168 hours. Coagulation Profile: No results for input(s): INR, PROTIME in the last 168 hours. Cardiac Enzymes: Recent Labs  Lab 09/16/20 0245  CKTOTAL 56   BNP (last 3 results) No results for input(s): PROBNP in the last 8760 hours. HbA1C: No results for input(s): HGBA1C in the last 72 hours. CBG: Recent Labs  Lab 09/16/20 0759  GLUCAP 104*   Lipid Profile: No results for input(s): CHOL, HDL, LDLCALC, TRIG, CHOLHDL, LDLDIRECT in the last 72 hours. Thyroid Function Tests: No results for input(s): TSH, T4TOTAL, FREET4, T3FREE, THYROIDAB in the last 72 hours. Anemia Panel: No results for input(s): VITAMINB12, FOLATE, FERRITIN, TIBC, IRON, RETICCTPCT in the last 72 hours. Urine analysis:    Component Value Date/Time   COLORURINE YELLOW (A) 09/16/2020 0229   APPEARANCEUR CLEAR (A) 09/16/2020 0229   APPEARANCEUR Clear 07/21/2012 1003   LABSPEC 1.004 (L) 09/16/2020 0229   LABSPEC 1.010 07/21/2012 1003   PHURINE 5.0 09/16/2020 0229   GLUCOSEU NEGATIVE 09/16/2020 0229   GLUCOSEU 50 mg/dL 07/21/2012 1003   HGBUR NEGATIVE 09/16/2020 0229   BILIRUBINUR NEGATIVE 09/16/2020 0229   BILIRUBINUR Negative 07/21/2012 1003   KETONESUR NEGATIVE 09/16/2020 0229   PROTEINUR NEGATIVE 09/16/2020 0229   NITRITE POSITIVE (A) 09/16/2020 0229   LEUKOCYTESUR NEGATIVE 09/16/2020 0229   LEUKOCYTESUR Negative 07/21/2012 1003   Sepsis Labs: @LABRCNTIP (procalcitonin:4,lacticidven:4) ) Recent Results (from the past 240 hour(s))  Resp Panel by RT-PCR (Flu A&B, Covid) Nasopharyngeal Swab     Status: None   Collection Time: 09/16/20  2:45 AM   Specimen: Nasopharyngeal Swab; Nasopharyngeal(NP) swabs in vial transport medium  Result Value Ref Range Status   SARS Coronavirus 2 by RT PCR NEGATIVE NEGATIVE Final    Comment: (NOTE) SARS-CoV-2 target nucleic acids are NOT DETECTED.  The SARS-CoV-2 RNA is generally detectable in upper  respiratory specimens during the acute phase of infection. The lowest concentration of SARS-CoV-2 viral copies this assay can detect is 138 copies/mL. A negative result does not preclude SARS-Cov-2 infection and should not be used as the sole basis for treatment or other patient management decisions. A negative result may occur with  improper specimen collection/handling, submission of specimen other than nasopharyngeal swab, presence of viral mutation(s) within the areas targeted by this assay, and inadequate number of viral copies(<138 copies/mL). A negative result must be combined with clinical observations, patient history, and epidemiological information. The expected result is Negative.  Fact Sheet for Patients:  EntrepreneurPulse.com.au  Fact Sheet for Healthcare Providers:  IncredibleEmployment.be  This test is no t yet approved or cleared by the [place] FDA and  has been authorized for detection and/or diagnosis of SARS-CoV-2 by FDA under an Emergency Use Authorization (EUA). This EUA will remain  in effect (meaning this test can be used) for the duration of the COVID-19 declaration under Section 564(b)(1) of the Act, 21 U.S.C.section 360bbb-3(b)(1), unless the authorization is terminated  or revoked sooner.       Influenza A by PCR NEGATIVE NEGATIVE Final   Influenza B by PCR NEGATIVE NEGATIVE Final    Comment: (NOTE) The Xpert Xpress SARS-CoV-2/FLU/RSV plus assay is intended as an aid in the diagnosis of influenza from Nasopharyngeal swab specimens and should not be used as a sole basis for treatment. Nasal washings and aspirates are unacceptable for Xpert Xpress SARS-CoV-2/FLU/RSV testing.  Fact Sheet for Patients: EntrepreneurPulse.com.au  Fact Sheet for Healthcare Providers: IncredibleEmployment.be  This test is not yet approved or cleared by the [place] FDA and has been  authorized for detection and/or diagnosis of SARS-CoV-2 by FDA under an Emergency Use Authorization (EUA). This EUA will remain in effect (meaning this test can be used) for the duration of the COVID-19 declaration under Section 564(b)(1) of the Act, 21 U.S.C. section 360bbb-3(b)(1), unless the authorization is terminated or revoked.  Performed at [facility], [address] [place], [place] [address]      Radiological Exams on Admission: DG Chest 2 View  Result Date: 09/16/2020 CLINICAL DATA:  Weakness EXAM: CHEST - 2 VIEW COMPARISON:  08/02/2012 FINDINGS: Left pacer remains in place, unchanged. Cardiomegaly. Lingular scarring or atelectasis. Right lung clear. No effusions or acute bony abnormality. Aortic atherosclerosis. IMPRESSION: Cardiomegaly.  No active disease. Lingular scarring or atelectasis. Electronically Signed   By: Rolm Baptise M.D.   On: 09/16/2020 00:34   DG Pelvis 1-2 Views  Result Date: 09/16/2020 CLINICAL DATA:  Fall EXAM: PELVIS - 1-2 VIEW COMPARISON:  None. FINDINGS: There is no evidence of pelvic fracture or diastasis. No pelvic bone lesions are seen. IMPRESSION: Negative. Electronically Signed   By: Ulyses Jarred M.D.   On: 09/16/2020 03:54   DG Knee Complete 4 Views Left  Result Date: 09/16/2020 CLINICAL DATA:  Fall EXAM: LEFT KNEE - COMPLETE 4+ VIEW COMPARISON:  None. FINDINGS: Small suprapatellar effusion. Total knee arthroplasty without adverse features. No fracture or dislocation. Vascular calcifications. IMPRESSION: Small suprapatellar effusion without fracture or dislocation. Total knee arthroplasty without adverse features. Electronically Signed   By: Ulyses Jarred M.D.   On: 09/16/2020 03:56   DG Knee Complete 4 Views Right  Result Date: 09/16/2020 CLINICAL DATA:  Fall EXAM: RIGHT KNEE - COMPLETE 4+ VIEW COMPARISON:  None. FINDINGS: Right total knee arthroplasty. No fracture or dislocation. Small suprapatellar effusion. IMPRESSION: No fracture  or dislocation of the right knee. Small suprapatellar effusion. Electronically Signed   By: Ulyses Jarred M.D.   On: 09/16/2020 03:55     EKG: I have personally reviewed.  Atrial fibrillation, QTC 481, paced rhythm  Assessment/Plan Principal Problem:   Generalized weakness Active Problems:   Essential hypertension   Paroxysmal atrial fibrillation (HCC)  History of CVA (cerebrovascular accident)   CAD (coronary artery disease)   Hyperlipemia   Type II diabetes mellitus with renal manifestations (HCC)   CKD (chronic kidney disease), stage IIIa   Depression with anxiety   Fall   UTI (urinary tract infection)   Seizure (HCC)   Generalized weakness and fall: Etiology for her generalized weakness is unclear, but likely multifactorial etiology including possible UTI and slightly worsening renal function. Pt had fall, complains of worsening bilateral knee pain, right knee is worse than the left.  X-ray of knees negative.  -will place on MedSurg bed for observation -Treat UTI as below -Follow-up CT of head --> negative for acute intracranial abnormalities. -Follow-up CT of right knee --> negative for bony fracture. -PT/OT -Pain control: As needed Tylenol and Percocet -check CK   Possible UTI: Patient has increased urinary frequency.  Urinalysis is positive nitrite. -Rocephin IV -Follow-up urine culture  Essential hypertension -IV hydralazine as needed -Hold Lasix due to worsening renal function -Continue home lisinopril  Paroxysmal atrial fibrillation ([place]): Heart rate 80s -Continue Eliquis  History of CVA (cerebrovascular accident) -Lipitor -Patient is on Eliquis for A. Fib  CAD (coronary artery disease) -Lipitor  Hyperlipemia -Lipitor  Type II diabetes mellitus with renal manifestations ([place]): No A1c on record.  Blood sugar 236.  Patient is taking glipizide at home -Sliding scale insulin  CKD (chronic kidney disease), stage IIIa: Slightly worsening.  Baseline  creatinine less than 1.0.  Her creatinine is 1.012, BUN 8 -IV fluid.  Patient received 1 L normal saline in ED -Hold Lasix  Depression with anxiety -Continue home medications  Seizure -Seizure precaution -When necessary Ativan for seizure -Continue Home medications: keppra       DVT ppx: Eliquis Code Status: Full code Family Communication: I have tried to call her husband and her daughter twice without success. Disposition Plan:  Anticipate discharge back to previous environment Consults called:  none Admission status and Level of care: Med-Surg:    for obs     Status is: Observation  The patient remains OBS appropriate and will d/c before 2 midnights.  Dispo: The patient is from: Home              Anticipated d/c is to: Home              Patient currently is not medically stable to d/c.   Difficult to place patient No          Date of Service 09/16/2020    [facility]   If 7PM-7AM, please contact night-coverage [email or web address] 09/16/2020, 9:12 AM

## 2020-09-16 NOTE — Progress Notes (Signed)
PT Cancellation Note  Patient Details Name: Traci Carter MRN: [id number] DOB: 09/05/1945   Cancelled Treatment:    Reason Eval/Treat Not Completed: Other (comment).  PT chart reviewed and attempted to see pt.  Upon arrival to room, nursing staff requested therapy to not see pt at this time as she was about to have pain medicine.  Pt will be seen at later date/time as medically appropriate.   Gwenlyn Saran, PT, DPT 09/16/20, 2:09 PM

## 2020-09-17 ENCOUNTER — Observation Stay: Payer: Medicare Other

## 2020-09-17 DIAGNOSIS — F32A Depression, unspecified: Secondary | ICD-10-CM | POA: Diagnosis present

## 2020-09-17 DIAGNOSIS — F05 Delirium due to known physiological condition: Secondary | ICD-10-CM | POA: Diagnosis not present

## 2020-09-17 DIAGNOSIS — R569 Unspecified convulsions: Secondary | ICD-10-CM | POA: Diagnosis present

## 2020-09-17 DIAGNOSIS — N3000 Acute cystitis without hematuria: Secondary | ICD-10-CM

## 2020-09-17 DIAGNOSIS — Z803 Family history of malignant neoplasm of breast: Secondary | ICD-10-CM | POA: Diagnosis not present

## 2020-09-17 DIAGNOSIS — R531 Weakness: Secondary | ICD-10-CM | POA: Diagnosis not present

## 2020-09-17 DIAGNOSIS — B9620 Unspecified Escherichia coli [E. coli] as the cause of diseases classified elsewhere: Secondary | ICD-10-CM | POA: Diagnosis present

## 2020-09-17 DIAGNOSIS — Z880 Allergy status to penicillin: Secondary | ICD-10-CM | POA: Diagnosis not present

## 2020-09-17 DIAGNOSIS — Z79899 Other long term (current) drug therapy: Secondary | ICD-10-CM | POA: Diagnosis not present

## 2020-09-17 DIAGNOSIS — G4733 Obstructive sleep apnea (adult) (pediatric): Secondary | ICD-10-CM | POA: Diagnosis present

## 2020-09-17 DIAGNOSIS — I2510 Atherosclerotic heart disease of native coronary artery without angina pectoris: Secondary | ICD-10-CM | POA: Diagnosis present

## 2020-09-17 DIAGNOSIS — W19XXXS Unspecified fall, sequela: Secondary | ICD-10-CM | POA: Diagnosis not present

## 2020-09-17 DIAGNOSIS — Z888 Allergy status to other drugs, medicaments and biological substances status: Secondary | ICD-10-CM | POA: Diagnosis not present

## 2020-09-17 DIAGNOSIS — Z20822 Contact with and (suspected) exposure to covid-19: Secondary | ICD-10-CM | POA: Diagnosis present

## 2020-09-17 DIAGNOSIS — I480 Paroxysmal atrial fibrillation: Secondary | ICD-10-CM | POA: Diagnosis present

## 2020-09-17 DIAGNOSIS — E1122 Type 2 diabetes mellitus with diabetic chronic kidney disease: Secondary | ICD-10-CM | POA: Diagnosis present

## 2020-09-17 DIAGNOSIS — Z7984 Long term (current) use of oral hypoglycemic drugs: Secondary | ICD-10-CM | POA: Diagnosis not present

## 2020-09-17 DIAGNOSIS — Z885 Allergy status to narcotic agent status: Secondary | ICD-10-CM | POA: Diagnosis not present

## 2020-09-17 DIAGNOSIS — Z7901 Long term (current) use of anticoagulants: Secondary | ICD-10-CM | POA: Diagnosis not present

## 2020-09-17 DIAGNOSIS — M797 Fibromyalgia: Secondary | ICD-10-CM | POA: Diagnosis present

## 2020-09-17 DIAGNOSIS — N1831 Chronic kidney disease, stage 3a: Secondary | ICD-10-CM | POA: Diagnosis present

## 2020-09-17 DIAGNOSIS — E785 Hyperlipidemia, unspecified: Secondary | ICD-10-CM | POA: Diagnosis present

## 2020-09-17 DIAGNOSIS — I129 Hypertensive chronic kidney disease with stage 1 through stage 4 chronic kidney disease, or unspecified chronic kidney disease: Secondary | ICD-10-CM | POA: Diagnosis present

## 2020-09-17 DIAGNOSIS — E860 Dehydration: Secondary | ICD-10-CM | POA: Diagnosis present

## 2020-09-17 DIAGNOSIS — N390 Urinary tract infection, site not specified: Secondary | ICD-10-CM | POA: Diagnosis present

## 2020-09-17 DIAGNOSIS — G9341 Metabolic encephalopathy: Secondary | ICD-10-CM | POA: Diagnosis present

## 2020-09-17 DIAGNOSIS — E1169 Type 2 diabetes mellitus with other specified complication: Secondary | ICD-10-CM | POA: Diagnosis present

## 2020-09-17 LAB — BASIC METABOLIC PANEL
Anion gap: 5 (ref 5–15)
BUN: 9 mg/dL (ref 8–23)
CO2: 29 mmol/L (ref 22–32)
Calcium: 8.7 mg/dL — ABNORMAL LOW (ref 8.9–10.3)
Chloride: 105 mmol/L (ref 98–111)
Creatinine, Ser: 0.94 mg/dL (ref 0.44–1.00)
GFR, Estimated: >60 mL/min (ref >60)
Glucose, Bld: 150 mg/dL — ABNORMAL HIGH (ref 70–99)
Potassium: 4 mmol/L (ref 3.5–5.1)
Sodium: 139 mmol/L (ref 135–145)

## 2020-09-17 LAB — HEMOGLOBIN A1C
Hgb A1c MFr Bld: 7 % — ABNORMAL HIGH (ref 4.8–5.6)
Mean Plasma Glucose: 154 mg/dL

## 2020-09-17 LAB — GLUCOSE, CAPILLARY
Glucose-Capillary: 126 mg/dL — ABNORMAL HIGH (ref 70–99)
Glucose-Capillary: 194 mg/dL — ABNORMAL HIGH (ref 70–99)
Glucose-Capillary: 197 mg/dL — ABNORMAL HIGH (ref 70–99)
Glucose-Capillary: 200 mg/dL — ABNORMAL HIGH (ref 70–99)
Glucose-Capillary: 183 mg/dL — ABNORMAL HIGH (ref 70–99)

## 2020-09-17 MED ORDER — SODIUM CHLORIDE 0.9 % IV SOLN
1.0000 g | INTRAVENOUS | Status: DC
  Administered 2020-09-18: 1 g via INTRAVENOUS
  Filled 2020-09-17: qty 1

## 2020-09-17 MED ORDER — COLCHICINE 0.6 MG PO TABS: 0.6000 mg | ORAL_TABLET | Freq: Every day | ORAL | Status: DC | PRN

## 2020-09-17 MED ORDER — FUROSEMIDE 20 MG PO TABS
20.0000 mg | ORAL_TABLET | Freq: Every day | ORAL | Status: DC
  Administered 2020-09-17 – 2020-09-21 (×5): 20 mg via ORAL
  Filled 2020-09-17 (×5): qty 1

## 2020-09-17 NOTE — Evaluation (Signed)
Physical Therapy Evaluation Patient Details Name: Traci Carter MRN: [id number] DOB: 09-17-1945 Today's Date: 09/17/2020   History of Present Illness  Traci Carter is a 75 y.o. female with medical history significant of hypertension, hyperlipidemia, diabetes mellitus, stroke, seizure, GERD, gout, depression with anxiety, atrial fibrillation on Eliquis, CAD, fibromyalgia, ICH 2014, OSA not on CPAP, pacemaker placement, CAD-3A, who presents with generalized weakness and fall.   Clinical Impression  Pt received in Semi-Fowler's position and agreeable to therapy.  Pt able to perform bed-level exercises, however needs some tactile cuing and minA to perform with the R LE.  Pt also requires consistent encouragement for motivation to perform exercises throughout the session.  Pt was able to come seated at EOB with heavy UE use of the handrails.  Once seated at EOB, pt required maxA +1 for standing attempt and was unable to perform.  Pt encouraged to perform STS, but was unable to perform.  Pt was however able to perform lateral scoots towards the [facility] in order to get in appropriate positioning.  Pt then transferred back to bed with MaxA for getting R LE back into the bed.  Pt required +2 Total Assist to position pt in bed appropriately.  Pt will benefit from skilled PT intervention to increase independence and safety with basic mobility in preparation for discharge to the venue listed below.       Follow Up Recommendations SNF    Equipment Recommendations  Other (comment) (To be determined at next venue of care.)    Recommendations for Other Services       Precautions / Restrictions Precautions Precautions: Fall Restrictions Weight Bearing Restrictions: No      Mobility  Bed Mobility Overal bed mobility: Needs Assistance Bed Mobility: Supine to Sit;Sit to Supine     Supine to sit: Mod assist Sit to supine: Max assist   General bed mobility comments: Pt required MaxA for transfer of R LE  into the bed.    Transfers Overall transfer level: Needs assistance Equipment used: 2 person hand held assist Transfers: Sit to/from Stand Sit to Stand: Max assist         General transfer comment: unable to come to full stand and place weight thru R LE.  Pt did perform lateral scoots in the bed in which her buttock came off the bed, but was unable to come into full standing.  Ambulation/Gait                Stairs            Wheelchair Mobility    Modified Rankin (Stroke Patients Only)       Balance Overall balance assessment: Needs assistance   Sitting balance-Leahy Scale: Fair     Standing balance support: Bilateral upper extremity supported Standing balance-Leahy Scale: Poor                               Pertinent Vitals/Pain Pain Assessment: 0-10 Pain Score: 9  Pain Intervention(s): Limited activity within patient's tolerance;Monitored during session;Premedicated before session;Repositioned    Home Living Family/patient expects to be discharged to:: Private residence Living Arrangements: Spouse/significant other Available Help at Discharge: Family;Available 24 hours/day Type of Home: House Home Access: Ramped entrance     Home Layout: One level Home Equipment: Walker - 2 wheels;Cane - single point      Prior Function Level of Independence: Independent with assistive device(s)  Comments: Pt reports performing sink bathing and able to dress herself independently at baseline. Pt reports use of RW.     Hand Dominance   Dominant Hand: Right    Extremity/Trunk Assessment   Upper Extremity Assessment Upper Extremity Assessment: Generalized weakness    Lower Extremity Assessment Lower Extremity Assessment: Generalized weakness       Communication   Communication: No difficulties  Cognition Arousal/Alertness: Awake/alert Behavior During Therapy: WFL for tasks assessed/performed Overall Cognitive Status: Within  Functional Limits for tasks assessed                                        General Comments      Exercises Total Joint Exercises Ankle Circles/Pumps: AROM;Strengthening;Both;10 reps;Supine Quad Sets: AROM;Strengthening;Both;10 reps;Supine Gluteal Sets: AROM;Strengthening;Both;10 reps;Supine Hip ABduction/ADduction: AROM;Strengthening;Both;10 reps;Supine Straight Leg Raises: AROM;Strengthening;Both;10 reps;Supine Other Exercises Other Exercises: Pt educated on roles of PT and services provided during hospital stay.  Pt also edcuated on importance of performing exercises in order to improve and prevent atrophy of the LEs.   Assessment/Plan    PT Assessment Patient needs continued PT services  PT Problem List         PT Treatment Interventions DME instruction;Gait training;Functional mobility training;Therapeutic activities;Therapeutic exercise;Balance training;Neuromuscular re-education    PT Goals (Current goals can be found in the Care Plan section)  Acute Rehab PT Goals Patient Stated Goal: to go home PT Goal Formulation: With patient/family Time For Goal Achievement: 10/01/20 Potential to Achieve Goals: Poor    Frequency Min 2X/week   Barriers to discharge Inaccessible home environment;Decreased caregiver support Pt is unable to stand at this point and her hsuband is unable to provide support due to his own strength deficits.    Co-evaluation               AM-PAC PT "6 Clicks" Mobility  Outcome Measure Help needed turning from your back to your side while in a flat bed without using bedrails?: A Lot Help needed moving from lying on your back to sitting on the side of a flat bed without using bedrails?: A Lot Help needed moving to and from a bed to a chair (including a wheelchair)?: Total Help needed standing up from a chair using your arms (e.g., wheelchair or bedside chair)?: Total Help needed to walk in hospital room?: Total Help needed  climbing 3-5 steps with a railing? : Total 6 Click Score: 8    End of Session Equipment Utilized During Treatment: Gait belt Activity Tolerance: Patient tolerated treatment well Patient left: in bed;with call bell/phone within reach;with bed alarm set;with nursing/sitter in room;with family/visitor present Nurse Communication: Mobility status PT Visit Diagnosis: Unsteadiness on feet (R26.81);Other abnormalities of gait and mobility (R26.89);Muscle weakness (generalized) (M62.81);History of falling (Z91.81);Difficulty in walking, not elsewhere classified (R26.2)    Time: 5993-5701 PT Time Calculation (min) (ACUTE ONLY): 48 min   Charges:   PT Evaluation $PT Eval Low Complexity: 1 Low PT Treatments $Therapeutic Exercise: 38-52 mins        Gwenlyn Saran, PT, DPT 09/17/20, 2:03 PM   Christie Nottingham 09/17/2020, 1:56 PM

## 2020-09-17 NOTE — Plan of Care (Signed)
°  Problem: Education: °Goal: Knowledge of General Education information will improve °Description: Including pain rating scale, medication(s)/side effects and non-pharmacologic comfort measures °Outcome: Not Progressing °  °Problem: Activity: °Goal: Risk for activity intolerance will decrease °Outcome: Not Progressing °  °Problem: Pain Managment: °Goal: General experience of comfort will improve °Outcome: Not Progressing °  °

## 2020-09-17 NOTE — TOC Progression Note (Addendum)
Transition of Care [facility]) - Progression Note    Patient Details  Name: Traci Carter MRN: [id number] Date of Birth: 1945/04/20  Transition of [facility]) CM/SW [place], RN Phone Number: 09/17/2020, 3:58 PM  Clinical Narrative:     Met with the patient and her husband in the room, they are agreeable to a bed search for her to go to [facility], Passr obtained, Fl2 completed, Bedsearch sent       Expected Discharge Plan and Services                                                 Social Determinants of Health (SDOH) Interventions    Readmission Risk Interventions No flowsheet data found.

## 2020-09-17 NOTE — Progress Notes (Signed)
PROGRESS NOTE    Traci Carter  YDX:[id number] DOB: February 13, 1946 DOA: 09/16/2020 PCP: Albina Billet, MD   Chief Complaint  Patient presents with   Weakness    Brief Narrative:   Traci Carter is a 75 y.o. female with medical history significant of hypertension, hyperlipidemia, diabetes mellitus, stroke, seizure, GERD, gout, depression with anxiety, atrial fibrillation on Eliquis, CAD, fibromyalgia, Daniels 2014, OSA not on CPAP, pacemaker placement, CAD-3A, who presents with generalized weakness and fall.  Patient states that she has been feeling weak for more than 2 days, which has been progressively worsening.  She states that normally she walks using walker, but now she can barely walking.  No unilateral numbness or tingling in extremities.  No facial droop or slurred speech.  States that she has intermittent mild dry cough, but no chest pain, shortness breath, fever or chills.  Denies nausea, vomiting, diarrhea and abdominal pain.  Patient states that she fell yesterday afternoon.  She states that she has bilateral knee replacement and chronic knee pain, but her knee pain has been worsening after fall.  The right knee is worse than the left.  Patient has increased urinary frequency, but no dysuria or burning on urination.  She states that she is taking Eliquis, her work-up was significant for UTI, she has significant right knee pain, with CT of the right knee with no evidence of fracture or dislocation.   Assessment & Plan:   Principal Problem:   Generalized weakness Active Problems:   Essential hypertension   Paroxysmal atrial fibrillation (HCC)   History of CVA (cerebrovascular accident)   CAD (coronary artery disease)   Hyperlipemia   Type II diabetes mellitus with renal manifestations (HCC)   CKD (chronic kidney disease), stage IIIa   Depression with anxiety   Fall   UTI (urinary tract infection)   Seizure (HCC)   Generalized weakness and fall:  -Etiology for her generalized  weakness is unclear, but likely multifactorial etiology including UTI , is poor ambulatory function at baseline due to significant osteoarthritis. -PT/OT consulted -Treat UTI as below -Follow-up CT of head --> negative for acute intracranial abnormalities. -Follow-up CT of right knee --> negative for bony fracture. -PT/OT consulted -Pain control: As needed Tylenol and Percocet    UTI:  - Patient has increased urinary frequency.  Urinalysis is positive nitrite.  Urine culture growing> 100,000 colonies of E. coli, continue with Rocephin, follow on final results.  Essential hypertension -IV hydralazine as needed -Continue home lisinopril   Paroxysmal atrial fibrillation ([place]):  -Continue Eliquis   History of CVA (cerebrovascular accident) -Lipitor -Patient is on Eliquis for A. Fib   CAD (coronary artery disease) -Lipitor   Hyperlipemia -Lipitor   Type II diabetes mellitus with renal manifestations ([place]): No A1c on record.  Blood sugar 236.  Patient is taking glipizide at home -Sliding scale insulin   CKD (chronic kidney disease), stage IIIa: Slightly worsening.  Baseline creatinine less than 1.0.  Her creatinine is 1.012, BUN 8 -IV fluid.  Patient received 1 L normal saline in ED -Monitor closely as resuming Lasix.   Depression with anxiety -Continue home medications   Seizure -Seizure precaution -When necessary Ativan for seizure -Continue Home medications: keppra         DVT prophylaxis: Eliquis Code Status: Full Family Communication: none at bedside Disposition:   Status is: Observation  The patient will require care spanning > 2 midnights and should be moved to inpatient because: IV treatments appropriate due to  intensity of illness or inability to take PO  Dispo: The patient is from: Home              Anticipated d/c is to: SNF              Patient currently is not medically stable to d/c.   Difficult to place patient No       Consultants:   No   Subjective:  Days of generalized weakness, she complains of right knee pain as well.  Objective: Vitals:   09/17/20 0035 09/17/20 0458 09/17/20 0737 09/17/20 1136  BP: 135/81 (!) 153/95 (!) 147/72 (!) 148/85  Pulse: 84 89 97 67  Resp: 18 18 18 15   Temp: 97.8 F (36.6 C) 98.4 F (36.9 C) 98.2 F (36.8 C) 97.7 F (36.5 C)  TempSrc:   Oral   SpO2: 95% 94% 95% 93%  Weight:      Height:        Intake/Output Summary (Last 24 hours) at 09/17/2020 1326 Last data filed at 09/17/2020 0174 Gross per 24 hour  Intake 3 ml  Output 900 ml  Net -897 ml   Filed Weights   09/15/20 2352  Weight: 114.3 kg    Examination:  Awake Alert, Oriented X 3, No new F.N deficits, Normal affect Symmetrical Chest wall movement, Good air movement bilaterally, CTAB RRR,No Gallops,Rubs or new Murmurs, No Parasternal Heave +ve B.Sounds, Abd Soft, No tenderness, No rebound - guarding or rigidity. No Cyanosis, Clubbing or edema, right knee with tenderness to palpation, but I could not appreciate any fluids or effusion or fluid collection.  No new Rash or bruise      Data Reviewed: I have personally reviewed following labs and imaging studies  CBC: Recent Labs  Lab 09/15/20 2355  WBC 8.4  HGB 13.0  HCT 39.1  MCV 89.7  PLT 944    Basic Metabolic Panel: Recent Labs  Lab 09/15/20 2355 09/17/20 0347  NA 140 139  K 4.2 4.0  CL 106 105  CO2 27 29  GLUCOSE 236* 150*  BUN 8 9  CREATININE 1.12* 0.94  CALCIUM 8.6* 8.7*    GFR: Estimated Creatinine Clearance: 66.1 mL/min (by C-G formula based on SCr of 0.94 mg/dL).  Liver Function Tests: No results for input(s): AST, ALT, ALKPHOS, BILITOT, PROT, ALBUMIN in the last 168 hours.  CBG: Recent Labs  Lab 09/16/20 1306 09/16/20 2051 09/17/20 0750 09/17/20 1137 09/17/20 1159  GLUCAP 166* 189* 126* 197* 194*     Recent Results (from the past 240 hour(s))  Urine culture     Status: None (Preliminary result)   Collection Time:  09/16/20  2:29 AM   Specimen: Urine, Random  Result Value Ref Range Status   Specimen Description   Final    URINE, RANDOM Performed at [facility], [address] [place], [place] [address]    Special Requests   Final    NONE Performed at [facility], [address], [place], [place] [address]    Culture   Final    CULTURE REINCUBATED FOR BETTER GROWTH Performed at [facility], [place] [address] [place], [place] [address]    Report Status PENDING  Incomplete  Resp Panel by RT-PCR (Flu A&B, Covid) Nasopharyngeal Swab     Status: None   Collection Time: 09/16/20  2:45 AM   Specimen: Nasopharyngeal Swab; Nasopharyngeal(NP) swabs in vial transport medium  Result Value Ref Range Status   SARS Coronavirus 2 by  RT PCR NEGATIVE NEGATIVE Final    Comment: (NOTE) SARS-CoV-2 target nucleic acids are NOT DETECTED.  The SARS-CoV-2 RNA is generally detectable in upper respiratory specimens during the acute phase of infection. The lowest concentration of SARS-CoV-2 viral copies this assay can detect is 138 copies/mL. A negative result does not preclude SARS-Cov-2 infection and should not be used as the sole basis for treatment or other patient management decisions. A negative result may occur with  improper specimen collection/handling, submission of specimen other than nasopharyngeal swab, presence of viral mutation(s) within the areas targeted by this assay, and inadequate number of viral copies(<138 copies/mL). A negative result must be combined with clinical observations, patient history, and epidemiological information. The expected result is Negative.  Fact Sheet for Patients:  EntrepreneurPulse.com.au  Fact Sheet for Healthcare Providers:  IncredibleEmployment.be  This test is no t yet approved or cleared by the [place] FDA and  has been authorized for detection and/or diagnosis of SARS-CoV-2 by FDA under an  Emergency Use Authorization (EUA). This EUA will remain  in effect (meaning this test can be used) for the duration of the COVID-19 declaration under Section 564(b)(1) of the Act, 21 U.S.C.section 360bbb-3(b)(1), unless the authorization is terminated  or revoked sooner.       Influenza A by PCR NEGATIVE NEGATIVE Final   Influenza B by PCR NEGATIVE NEGATIVE Final    Comment: (NOTE) The Xpert Xpress SARS-CoV-2/FLU/RSV plus assay is intended as an aid in the diagnosis of influenza from Nasopharyngeal swab specimens and should not be used as a sole basis for treatment. Nasal washings and aspirates are unacceptable for Xpert Xpress SARS-CoV-2/FLU/RSV testing.  Fact Sheet for Patients: EntrepreneurPulse.com.au  Fact Sheet for Healthcare Providers: IncredibleEmployment.be  This test is not yet approved or cleared by the [place] FDA and has been authorized for detection and/or diagnosis of SARS-CoV-2 by FDA under an Emergency Use Authorization (EUA). This EUA will remain in effect (meaning this test can be used) for the duration of the COVID-19 declaration under Section 564(b)(1) of the Act, 21 U.S.C. section 360bbb-3(b)(1), unless the authorization is terminated or revoked.  Performed at [facility], [address], [place],  [address]          Radiology Studies: DG Chest 2 View  Result Date: 09/16/2020 CLINICAL DATA:  Weakness EXAM: CHEST - 2 VIEW COMPARISON:  08/02/2012 FINDINGS: Left pacer remains in place, unchanged. Cardiomegaly. Lingular scarring or atelectasis. Right lung clear. No effusions or acute bony abnormality. Aortic atherosclerosis. IMPRESSION: Cardiomegaly.  No active disease. Lingular scarring or atelectasis. Electronically Signed   By: Rolm Baptise M.D.   On: 09/16/2020 00:34   DG Pelvis 1-2 Views  Result Date: 09/16/2020 CLINICAL DATA:  Fall EXAM: PELVIS - 1-2 VIEW COMPARISON:  None. FINDINGS:  There is no evidence of pelvic fracture or diastasis. No pelvic bone lesions are seen. IMPRESSION: Negative. Electronically Signed   By: Ulyses Jarred M.D.   On: 09/16/2020 03:54   CT HEAD WO CONTRAST  Result Date: 09/16/2020 CLINICAL DATA:  Headaches and recent fall, initial encounter EXAM: CT HEAD WITHOUT CONTRAST TECHNIQUE: Contiguous axial images were obtained from the base of the skull through the vertex without intravenous contrast. COMPARISON:  None. FINDINGS: Brain: No evidence of acute infarction, hemorrhage, hydrocephalus, extra-axial collection or mass lesion/mass effect. Mild chronic white matter ischemic changes are noted. Vascular: No hyperdense vessel or unexpected calcification. Skull: Normal. Negative for fracture or focal lesion. Sinuses/Orbits: No acute finding. Other: None. IMPRESSION:  Chronic white matter ischemic changes without acute abnormality. Electronically Signed   By: Inez Catalina M.D.   On: 09/16/2020 09:10   CT KNEE RIGHT WO CONTRAST  Result Date: 09/16/2020 CLINICAL DATA:  Right knee pain and throbbing since a fall yesterday. History of prior right knee replacement. EXAM: CT OF THE RIGHT KNEE WITHOUT CONTRAST TECHNIQUE: Multidetector CT imaging of the right knee was performed according to the standard protocol. Multiplanar CT image reconstructions were also generated. COMPARISON:  Plain films right knee today and 07/06/2004. FINDINGS: Bones/Joint/Cartilage There is some streak artifact on the examination from the patient's arthroplasty. No fracture, dislocation or other acute bony abnormality is seen. No evidence of hardware complication related to the patient's arthroplasty is identified. No focal bony lesion. Small knee joint effusion noted. Ligaments Suboptimally assessed by CT. Muscles and Tendons Visualization is limited but is seen. All imaged muscle bellies appear mildly to moderately atrophic likely secondary to disuse. Soft tissues Areas of infiltration of  subcutaneous fat are seen medially and posteriorly about the knee. No mass. IMPRESSION: Negative for acute bony or joint abnormality. Infiltration of subcutaneous fat about the knee may be due to contusions given the patient's history of fall. Status post knee replacement. No hardware complication. Small joint effusion noted. Electronically Signed   By: Inge Rise M.D.   On: 09/16/2020 09:19   DG Knee Complete 4 Views Left  Result Date: 09/16/2020 CLINICAL DATA:  Fall EXAM: LEFT KNEE - COMPLETE 4+ VIEW COMPARISON:  None. FINDINGS: Small suprapatellar effusion. Total knee arthroplasty without adverse features. No fracture or dislocation. Vascular calcifications. IMPRESSION: Small suprapatellar effusion without fracture or dislocation. Total knee arthroplasty without adverse features. Electronically Signed   By: Ulyses Jarred M.D.   On: 09/16/2020 03:56   DG Knee Complete 4 Views Right  Result Date: 09/16/2020 CLINICAL DATA:  Fall EXAM: RIGHT KNEE - COMPLETE 4+ VIEW COMPARISON:  None. FINDINGS: Right total knee arthroplasty. No fracture or dislocation. Small suprapatellar effusion. IMPRESSION: No fracture or dislocation of the right knee. Small suprapatellar effusion. Electronically Signed   By: Ulyses Jarred M.D.   On: 09/16/2020 03:55   CT NO CHARGE  Result Date: 09/17/2020 CLINICAL DATA:  Right knee pain and throbbing following fall. EXAM: CT ADDITIONAL VIEWS AT NO CHARGE TECHNIQUE: Repeat imaging was performed in error. This examination was performed on 09/16/2020. CONTRAST:  None COMPARISON:  09/16/2020 FINDINGS: No new findings identified. No interval change since prior examination. IMPRESSION: This examination was performed previously on 09/16/2020. Please refer to that report for further details. Electronically Signed   By: Fidela Salisbury MD   On: 09/17/2020 11:35        Scheduled Meds:  apixaban  5 mg Oral BID   atorvastatin  20 mg Oral Daily   DULoxetine  60 mg Oral Daily    insulin aspart  0-5 Units Subcutaneous QHS   insulin aspart  0-9 Units Subcutaneous TID WC   levETIRAcetam  1,000 mg Oral BID   lisinopril  5 mg Oral Daily   pregabalin  150 mg Oral BID   sodium chloride flush  10 mL Intravenous Q12H   zolpidem  5 mg Oral QHS   Continuous Infusions:   LOS: 0 days       Phillips Climes, MD [facility]   To contact the attending provider between 7A-7P or the covering provider during after hours 7P-7A, please log into the web site [email or web address] and access using universal  password for that web  site. If you do not have the password, please call the hospital operator.  09/17/2020, 1:26 PM

## 2020-09-17 NOTE — NC FL2 (Signed)
[place] LEVEL OF CARE SCREENING TOOL     IDENTIFICATION  Patient Name: Traci Carter Birthdate: [birth year] Sex: female Admission Date (Current Location): 09/16/2020  [facility] and [place] Number:  [employer] and Address:  [facility], [address] [place],  [address]      Provider Number: [id number]  Attending Physician Name and Address:  Elgergawy, Silver Huguenin, MD  Relative Name and Phone Number:  Tye Juarez Husband [phone]    Current Level of Care: Hospital Recommended Level of Care: [place] Prior Approval Number:    Date Approved/Denied:   PASRR Number: [id number]  Discharge Plan: SNF    Current Diagnoses: Patient Active Problem List   Diagnosis Date Noted   Generalized weakness 09/16/2020   Type II diabetes mellitus with renal manifestations ([place]) 09/16/2020   CKD (chronic kidney disease), stage IIIa 09/16/2020   Depression with anxiety 09/16/2020   Fall 09/16/2020   UTI (urinary tract infection) 09/16/2020   Seizure ([place]) 09/16/2020   Diabetes mellitus type 2, uncomplicated ([place]) 58/52/7782   Chest pain with low risk for cardiac etiology 12/27/2018   SOB (shortness of breath) on exertion 12/27/2018   Paroxysmal atrial fibrillation ([place]) 10/16/2018   Hyperlipidemia associated with type 2 diabetes mellitus ([place]) 10/16/2018   Pacemaker 10/16/2018   History of CVA (cerebrovascular accident) 10/16/2018   OSA (obstructive sleep apnea) 10/16/2018   Fibromyalgia 10/16/2018   CAD (coronary artery disease) 10/16/2018   Diarrhea 06/09/2017   Hyperlipemia 08/15/2013   ICH (intracerebral hemorrhage) ([place]) 07/21/2012   Diabetes ([place]) 07/21/2012   Essential hypertension 07/21/2012   CVA (cerebral vascular accident) ([place]) 07/20/2012    Orientation RESPIRATION BLADDER Height & Weight     Self, Time, Situation  Normal External catheter Weight: 114.3 kg Height:  5\' 4"  (162.6 cm)  BEHAVIORAL  SYMPTOMS/MOOD NEUROLOGICAL BOWEL NUTRITION STATUS      Continent Diet  AMBULATORY STATUS COMMUNICATION OF NEEDS Skin   Extensive Assist Verbally (Carb Modified) Normal                       Personal Care Assistance Level of Assistance  Bathing, Dressing Bathing Assistance: Limited assistance   Dressing Assistance: Limited assistance     Functional Limitations Info             SPECIAL CARE FACTORS FREQUENCY  PT (By licensed PT)     PT Frequency: 5 times per week              Contractures Contractures Info: Not present    Additional Factors Info  Code Status, Allergies Code Status Info: full code Allergies Info: Codeine, Lanoxin Digoxin, Zoloft Sertraline Hcl, Penicillins           Current Medications (09/17/2020):  This is the current hospital active medication list Current Facility-Administered Medications  Medication Dose Route Frequency Provider Last Rate Last Admin   albuterol (PROVENTIL) (2.5 MG/3ML) 0.083% nebulizer solution 3 mL  3 mL Inhalation Q4H PRN Ivor Costa, MD       apixaban Arne [place]) tablet 5 mg  5 mg Oral BID Ivor Costa, MD   5 mg at 09/17/20 0807   atorvastatin (LIPITOR) tablet 20 mg  20 mg Oral Daily Ivor Costa, MD   20 mg at 09/17/20 0806   [START ON 09/18/2020] cefTRIAXone (ROCEPHIN) 1 g in sodium chloride 0.9 % 100 mL IVPB  1 g Intravenous Q24H Elgergawy, Silver Huguenin, MD  colchicine tablet 0.6 mg  0.6 mg Oral Daily PRN Dallie Piles, RPH       DULoxetine (CYMBALTA) DR capsule 60 mg  60 mg Oral Daily Ivor Costa, MD   60 mg at 09/17/20 0805   furosemide (LASIX) tablet 20 mg  20 mg Oral Daily Elgergawy, Silver Huguenin, MD       hydrALAZINE (APRESOLINE) injection 5 mg  5 mg Intravenous Q2H PRN Ivor Costa, MD       insulin aspart (novoLOG) injection 0-5 Units  0-5 Units Subcutaneous QHS Ivor Costa, MD       insulin aspart (novoLOG) injection 0-9 Units  0-9 Units Subcutaneous TID WC Ivor Costa, MD   2 Units at 09/17/20 1235   levETIRAcetam  (KEPPRA) tablet 1,000 mg  1,000 mg Oral BID Ivor Costa, MD   1,000 mg at 09/17/20 0806   lisinopril (ZESTRIL) tablet 5 mg  5 mg Oral Daily Ivor Costa, MD   5 mg at 09/17/20 4656   loperamide HCl (IMODIUM) 1 MG/7.5ML suspension 1 mg  1 mg Oral Daily PRN Ivor Costa, MD       LORazepam (ATIVAN) injection 1 mg  1 mg Intravenous Q2H PRN Ivor Costa, MD       LORazepam (ATIVAN) tablet 0.5 mg  0.5 mg Oral BID PRN Ivor Costa, MD       ondansetron [facility]) injection 4 mg  4 mg Intravenous Q8H PRN Ivor Costa, MD       oxyCODONE-acetaminophen (PERCOCET/ROXICET) 5-325 MG per tablet 1 tablet  1 tablet Oral Q6H PRN Ivor Costa, MD   1 tablet at 09/17/20 0956   pregabalin (LYRICA) capsule 150 mg  150 mg Oral BID Ivor Costa, MD   150 mg at 09/17/20 0807   sodium chloride flush (NS) 0.9 % injection 10 mL  10 mL Intravenous Q12H Ivor Costa, MD   10 mL at 09/17/20 0808   zolpidem (AMBIEN) tablet 5 mg  5 mg Oral QHS HallajiDani Gobble, RPH   5 mg at 09/16/20 2129     Discharge Medications: Please see discharge summary for a list of discharge medications.  Relevant Imaging Results:  Relevant Lab Results:   Additional Information SS# [id number]  Su Hilt, RN

## 2020-09-17 NOTE — Plan of Care (Signed)
  Problem: Activity: Goal: Risk for activity intolerance will decrease Outcome: Not Progressing Note: Encourage patient to engage in self care.

## 2020-09-18 DIAGNOSIS — N3000 Acute cystitis without hematuria: Secondary | ICD-10-CM | POA: Diagnosis not present

## 2020-09-18 DIAGNOSIS — R531 Weakness: Secondary | ICD-10-CM | POA: Diagnosis not present

## 2020-09-18 LAB — GLUCOSE, CAPILLARY
Glucose-Capillary: 174 mg/dL — ABNORMAL HIGH (ref 70–99)
Glucose-Capillary: 144 mg/dL — ABNORMAL HIGH (ref 70–99)
Glucose-Capillary: 227 mg/dL — ABNORMAL HIGH (ref 70–99)
Glucose-Capillary: 181 mg/dL — ABNORMAL HIGH (ref 70–99)

## 2020-09-18 LAB — CBC
HCT: 37.3 % (ref 36.0–46.0)
Hemoglobin: 12.4 g/dL (ref 12.0–15.0)
MCH: 29.9 pg (ref 26.0–34.0)
MCHC: 33.2 g/dL (ref 30.0–36.0)
MCV: 89.9 fL (ref 80.0–100.0)
Platelets: 202 10*3/uL (ref 150–400)
RBC: 4.15 MIL/uL (ref 3.87–5.11)
RDW: 13.4 % (ref 11.5–15.5)
WBC: 10.1 10*3/uL (ref 4.0–10.5)
nRBC: 0 % (ref 0.0–0.2)

## 2020-09-18 LAB — URINE CULTURE: Culture: >=100000 — AB

## 2020-09-18 LAB — BASIC METABOLIC PANEL
Anion gap: 7 (ref 5–15)
BUN: 12 mg/dL (ref 8–23)
CO2: 27 mmol/L (ref 22–32)
Calcium: 8.8 mg/dL — ABNORMAL LOW (ref 8.9–10.3)
Chloride: 105 mmol/L (ref 98–111)
Creatinine, Ser: 1.13 mg/dL — ABNORMAL HIGH (ref 0.44–1.00)
GFR, Estimated: 51 mL/min — ABNORMAL LOW (ref >60)
Glucose, Bld: 183 mg/dL — ABNORMAL HIGH (ref 70–99)
Potassium: 3.7 mmol/L (ref 3.5–5.1)
Sodium: 139 mmol/L (ref 135–145)

## 2020-09-18 NOTE — Progress Notes (Signed)
Patient alert to self and spouse at bedside appears to be confused. Spouse roaming the hallway and going into other patients rooms. Spouse assisted back to patients room and daughter on file called. Daughter Adonis Brook states that we have the wrong number and she gets a lot of calls about the patient. Security notified and assisted with spouse. This RN was in patients room when daughter Adonis Brook called patients phone. Explained to [place] events taking place . She states that her husband dropped father off and that her daughter Merleen Nicely can come pick up her father. Spouse has been picked up by Merleen Nicely. AC and security aware.

## 2020-09-18 NOTE — Progress Notes (Signed)
PROGRESS NOTE    Traci Carter  YQI:[id number] DOB: 11-06-1945 DOA: 09/16/2020 PCP: Albina Billet, MD   Chief Complaint  Patient presents with   Weakness    Brief Narrative:   Traci Carter is a 75 y.o. female with medical history significant of hypertension, hyperlipidemia, diabetes mellitus, stroke, seizure, GERD, gout, depression with anxiety, atrial fibrillation on Eliquis, CAD, fibromyalgia, [place] 2014, OSA not on CPAP, pacemaker placement, CAD-3A, who presents with generalized weakness and fall.  Patient states that she has been feeling weak for more than 2 days, which has been progressively worsening.  She states that normally she walks using walker, but now she can barely walking.  No unilateral numbness or tingling in extremities.  No facial droop or slurred speech.  States that she has intermittent mild dry cough, but no chest pain, shortness breath, fever or chills.  Denies nausea, vomiting, diarrhea and abdominal pain.  Patient states that she fell yesterday afternoon.  She states that she has bilateral knee replacement and chronic knee pain, but her knee pain has been worsening after fall.  The right knee is worse than the left.  Patient has increased urinary frequency, but no dysuria or burning on urination.  She states that she is taking Eliquis, her work-up was significant for UTI, she has significant right knee pain, with CT of the right knee with no evidence of fracture or dislocation.   Assessment & Plan:   Principal Problem:   Generalized weakness Active Problems:   Essential hypertension   Paroxysmal atrial fibrillation (HCC)   History of CVA (cerebrovascular accident)   CAD (coronary artery disease)   Hyperlipemia   Type II diabetes mellitus with renal manifestations (HCC)   CKD (chronic kidney disease), stage IIIa   Depression with anxiety   Fall   UTI (urinary tract infection)   Seizure (HCC)   Generalized weakness and fall:  -Is most likely in the setting  of UTI, poor ambulatory dysfunction and significant osteoarthritis with deconditioning. -He is complaining with right knee pain, no evidence of bony fracture on imaging, appears to be improving with physical activity and PT -CT head with no acute findings    UTI:  - Patient has increased urinary frequency.  Urinalysis is positive nitrite.  Urine culture growing> 100,000 colonies of E. coli, continue with Rocephin  Essential hypertension -IV hydralazine as needed -Continue home lisinopril   Paroxysmal atrial fibrillation ([place]):  -Continue Eliquis   History of CVA (cerebrovascular accident) -Lipitor -Patient is on Eliquis for A. Fib   CAD (coronary artery disease) -Lipitor   Hyperlipemia -Lipitor   Type II diabetes mellitus with renal manifestations ([place]): No A1c on record.  Blood sugar 236.  Patient is taking glipizide at home -Sliding scale insulin   CKD (chronic kidney disease), stage IIIa: Slightly worsening.  Baseline creatinine less than 1.0.  Her creatinine is 1.012, BUN 8 -IV fluid.  Patient received 1 L normal saline in ED -Monitor closely as resuming Lasix.   Depression with anxiety -Continue home medications   Seizure -Seizure precaution -When necessary Ativan for seizure -Continue Home medications: keppra         DVT prophylaxis: Eliquis Code Status: Full Family Communication: Husband at bedside Disposition:   Status is: Observation  The patient will require care spanning > 2 midnights and should be moved to inpatient because: IV treatments appropriate due to intensity of illness or inability to take PO  Dispo: The patient is from: Home  Anticipated d/c is to: SNF              Patient currently is medically stable to d/c.  When bed is available, likely by Monday, will obtain COVID test on Sunday.   Difficult to place patient No       Consultants:  No   Subjective:  Days of generalized weakness, she complains of right knee pain  as well.  Objective: Vitals:   09/18/20 0449 09/18/20 0759 09/18/20 1144 09/18/20 1540  BP: 106/68 105/72 118/73 105/70  Pulse: 68 75 79 86  Resp:  15 16 15   Temp: 99 F (37.2 C) 98.6 F (37 C) 99.3 F (37.4 C) 98.7 F (37.1 C)  TempSrc: Oral Oral Oral   SpO2: 100% 92% 90% 96%  Weight:      Height:        Intake/Output Summary (Last 24 hours) at 09/18/2020 1555 Last data filed at 09/18/2020 1500 Gross per 24 hour  Intake 340 ml  Output 1150 ml  Net -810 ml   Filed Weights   09/15/20 2352  Weight: 114.3 kg    Examination:  Awake Alert, Oriented X 3, No new F.N deficits, Normal affect Symmetrical Chest wall movement, Good air movement bilaterally, CTAB RRR,No Gallops,Rubs or new Murmurs, No Parasternal Heave +ve B.Sounds, Abd Soft, No tenderness, No rebound - guarding or rigidity. No Cyanosis, Clubbing or edema, No new Rash or bruise       Data Reviewed: I have personally reviewed following labs and imaging studies  CBC: Recent Labs  Lab 09/15/20 2355 09/18/20 0434  WBC 8.4 10.1  HGB 13.0 12.4  HCT 39.1 37.3  MCV 89.7 89.9  PLT 193 469    Basic Metabolic Panel: Recent Labs  Lab 09/15/20 2355 09/17/20 0347 09/18/20 0434  NA 140 139 139  K 4.2 4.0 3.7  CL 106 105 105  CO2 27 29 27   GLUCOSE 236* 150* 183*  BUN 8 9 12   CREATININE 1.12* 0.94 1.13*  CALCIUM 8.6* 8.7* 8.8*    GFR: Estimated Creatinine Clearance: 54.9 mL/min (A) (by C-G formula based on SCr of 1.13 mg/dL (H)).  Liver Function Tests: No results for input(s): AST, ALT, ALKPHOS, BILITOT, PROT, ALBUMIN in the last 168 hours.  CBG: Recent Labs  Lab 09/17/20 1159 09/17/20 1617 09/17/20 2015 09/18/20 0804 09/18/20 1146  GLUCAP 194* 200* 183* 174* 144*     Recent Results (from the past 240 hour(s))  Urine culture     Status: Abnormal   Collection Time: 09/16/20  2:29 AM   Specimen: Urine, Random  Result Value Ref Range Status   Specimen Description   Final    URINE,  RANDOM Performed at [facility], [address], [place], [place] [address]    Special Requests   Final    NONE Performed at [facility], [address] [place], [place] [address]    Culture >=100,000 COLONIES/mL ESCHERICHIA COLI (A)  Final   Report Status 09/18/2020 FINAL  Final   Organism ID, Bacteria ESCHERICHIA COLI (A)  Final      Susceptibility   Escherichia coli - MIC*    AMPICILLIN <=2 SENSITIVE Sensitive     CEFAZOLIN <=4 SENSITIVE Sensitive     CEFEPIME <=0.12 SENSITIVE Sensitive     CEFTRIAXONE <=0.25 SENSITIVE Sensitive     CIPROFLOXACIN <=0.25 SENSITIVE Sensitive     GENTAMICIN <=1 SENSITIVE Sensitive     IMIPENEM <=0.25 SENSITIVE Sensitive     NITROFURANTOIN 32  SENSITIVE Sensitive     TRIMETH/SULFA <=20 SENSITIVE Sensitive     AMPICILLIN/SULBACTAM <=2 SENSITIVE Sensitive     PIP/TAZO <=4 SENSITIVE Sensitive     * >=100,000 COLONIES/mL ESCHERICHIA COLI  Resp Panel by RT-PCR (Flu A&B, Covid) Nasopharyngeal Swab     Status: None   Collection Time: 09/16/20  2:45 AM   Specimen: Nasopharyngeal Swab; Nasopharyngeal(NP) swabs in vial transport medium  Result Value Ref Range Status   SARS Coronavirus 2 by RT PCR NEGATIVE NEGATIVE Final    Comment: (NOTE) SARS-CoV-2 target nucleic acids are NOT DETECTED.  The SARS-CoV-2 RNA is generally detectable in upper respiratory specimens during the acute phase of infection. The lowest concentration of SARS-CoV-2 viral copies this assay can detect is 138 copies/mL. A negative result does not preclude SARS-Cov-2 infection and should not be used as the sole basis for treatment or other patient management decisions. A negative result may occur with  improper specimen collection/handling, submission of specimen other than nasopharyngeal swab, presence of viral mutation(s) within the areas targeted by this assay, and inadequate number of viral copies(<138 copies/mL). A negative result must be combined  with clinical observations, patient history, and epidemiological information. The expected result is Negative.  Fact Sheet for Patients:  EntrepreneurPulse.com.au  Fact Sheet for Healthcare Providers:  IncredibleEmployment.be  This test is no t yet approved or cleared by the [place] FDA and  has been authorized for detection and/or diagnosis of SARS-CoV-2 by FDA under an Emergency Use Authorization (EUA). This EUA will remain  in effect (meaning this test can be used) for the duration of the COVID-19 declaration under Section 564(b)(1) of the Act, 21 U.S.C.section 360bbb-3(b)(1), unless the authorization is terminated  or revoked sooner.       Influenza A by PCR NEGATIVE NEGATIVE Final   Influenza B by PCR NEGATIVE NEGATIVE Final    Comment: (NOTE) The Xpert Xpress SARS-CoV-2/FLU/RSV plus assay is intended as an aid in the diagnosis of influenza from Nasopharyngeal swab specimens and should not be used as a sole basis for treatment. Nasal washings and aspirates are unacceptable for Xpert Xpress SARS-CoV-2/FLU/RSV testing.  Fact Sheet for Patients: EntrepreneurPulse.com.au  Fact Sheet for Healthcare Providers: IncredibleEmployment.be  This test is not yet approved or cleared by the [place] FDA and has been authorized for detection and/or diagnosis of SARS-CoV-2 by FDA under an Emergency Use Authorization (EUA). This EUA will remain in effect (meaning this test can be used) for the duration of the COVID-19 declaration under Section 564(b)(1) of the Act, 21 U.S.C. section 360bbb-3(b)(1), unless the authorization is terminated or revoked.  Performed at [facility], [address], [place], [place] [address]          Radiology Studies: CT NO CHARGE  Result Date: 09/17/2020 CLINICAL DATA:  Right knee pain and throbbing following fall. EXAM: CT ADDITIONAL VIEWS AT NO CHARGE  TECHNIQUE: Repeat imaging was performed in error. This examination was performed on 09/16/2020. CONTRAST:  None COMPARISON:  09/16/2020 FINDINGS: No new findings identified. No interval change since prior examination. IMPRESSION: This examination was performed previously on 09/16/2020. Please refer to that report for further details. Electronically Signed   By: Fidela Salisbury MD   On: 09/17/2020 11:35        Scheduled Meds:  apixaban  5 mg Oral BID   atorvastatin  20 mg Oral Daily   DULoxetine  60 mg Oral Daily   furosemide  20 mg Oral Daily   insulin aspart  0-5 Units Subcutaneous QHS   insulin aspart  0-9 Units Subcutaneous TID WC   levETIRAcetam  1,000 mg Oral BID   lisinopril  5 mg Oral Daily   pregabalin  150 mg Oral BID   sodium chloride flush  10 mL Intravenous Q12H   zolpidem  5 mg Oral QHS   Continuous Infusions:   LOS: 1 day       Phillips Climes, MD [facility]   To contact the attending provider between 7A-7P or the covering provider during after hours 7P-7A, please log into the web site [email or web address] and access using universal [place] password for that web site. If you do not have the password, please call the hospital operator.  09/18/2020, 3:55 PM

## 2020-09-18 NOTE — TOC Progression Note (Addendum)
Transition of Care [facility]) - Progression Note    Patient Details  Name: Traci Carter MRN: [id number] Date of Birth: January 16, 1946  Transition of Care [facility]) CM/SW [place], RN Phone Number: 09/18/2020, 10:50 AM  Clinical Narrative:     Spoke with the patient and her husband to review the bed offer from [facility], reviewed the [employer], They accepted the bed offer, I notified leslie at [facility] via phone and thru the Hub, Sent Clinical notes to [facility] and requested ins approval  Ref number [id number]      Expected Discharge Plan and Services                                                 Social Determinants of Health (SDOH) Interventions    Readmission Risk Interventions No flowsheet data found.

## 2020-09-19 DIAGNOSIS — W19XXXS Unspecified fall, sequela: Secondary | ICD-10-CM

## 2020-09-19 DIAGNOSIS — N3000 Acute cystitis without hematuria: Secondary | ICD-10-CM | POA: Diagnosis not present

## 2020-09-19 DIAGNOSIS — R531 Weakness: Secondary | ICD-10-CM | POA: Diagnosis not present

## 2020-09-19 LAB — GLUCOSE, CAPILLARY
Glucose-Capillary: 159 mg/dL — ABNORMAL HIGH (ref 70–99)
Glucose-Capillary: 190 mg/dL — ABNORMAL HIGH (ref 70–99)
Glucose-Capillary: 176 mg/dL — ABNORMAL HIGH (ref 70–99)
Glucose-Capillary: 220 mg/dL — ABNORMAL HIGH (ref 70–99)
Glucose-Capillary: 209 mg/dL — ABNORMAL HIGH (ref 70–99)

## 2020-09-19 MED ORDER — SENNOSIDES-DOCUSATE SODIUM 8.6-50 MG PO TABS
2.0000 | ORAL_TABLET | Freq: Two times a day (BID) | ORAL | Status: DC
  Administered 2020-09-19 – 2020-09-21 (×5): 2 via ORAL
  Filled 2020-09-19 (×5): qty 2

## 2020-09-19 NOTE — Progress Notes (Addendum)
Pt's husband Traci Carter at bedside appears confused. Alert to self and knows that he is in the hospital. Reports that "[employer] is fixing my car, they dropped me off and I cannot walk back over there" Traci Carter was reminded that visiting hours are over at 8:00 PM and that he needs to go home. He states "well, I don't have nobody to take me, I can't drive." In the meantime, Traci Carter keeps coming out of the room and also attempting to get the patient out of bed. Daughter Traci Carter was called and asked to come pick up her father. Traci Carter responded "I just got off the phone, I was talking to him and he sounds perfectly fine, he is alert and oriented to me, I spoke to security and security gave me permission for him to stay over the hospital with my mother as long as he's not causing any problem" I educated Traci Carter that I was the nurse taking care of her mother tonight and on my evaluation her father is confused and that she needed to please come get him. Traci Carter went on to say " no, you are not taking care of her, you all are killing her and if I have to go over there and show-off, I need discharge paperwork for my mother because she is coming with me" I politely educated her about the hospital's AMA policy, and also advised her that if we ran out of options we will need to contact adult protective services. Traci Carter insists her father is acting confused because we are causing more harm to her mother than good and that she will be contacting protected services and "report you guys." Traci Carter insisted her father is not confused and that she has permission for him to stay. I expressed safety concerns to  Traci Carter and requested to please come get her father. Traci Carter remained upset and expressed frustration during our conversation  and ended the conversation saying "I will go over there and show-off and take security with me." [facility] made aware of the situation.    8:20 PM Traci Carter is  here to pick up her father after multiple conversations with nurses and charge nurse [place].

## 2020-09-19 NOTE — Progress Notes (Signed)
PROGRESS NOTE    Traci Carter  AST:[id number] DOB: 1945/05/05 DOA: 09/16/2020 PCP: Albina Billet, MD   Chief Complaint  Patient presents with   Weakness    Brief Narrative:   Traci Carter is a 75 y.o. female with medical history significant of hypertension, hyperlipidemia, diabetes mellitus, stroke, seizure, GERD, gout, depression with anxiety, atrial fibrillation on Eliquis, CAD, fibromyalgia, [place] 2014, OSA not on CPAP, pacemaker placement, CAD-3A, who presents with generalized weakness and fall.  Patient states that she has been feeling weak for more than 2 days, which has been progressively worsening.  She states that normally she walks using walker, but now she can barely walking.  No unilateral numbness or tingling in extremities.  No facial droop or slurred speech.  States that she has intermittent mild dry cough, but no chest pain, shortness breath, fever or chills.  Denies nausea, vomiting, diarrhea and abdominal pain.  Patient states that she fell yesterday afternoon.  She states that she has bilateral knee replacement and chronic knee pain, but her knee pain has been worsening after fall.  The right knee is worse than the left.  Patient has increased urinary frequency, but no dysuria or burning on urination.  She states that she is taking Eliquis, her work-up was significant for UTI, she has significant right knee pain, with CT of the right knee with no evidence of fracture or dislocation.   Assessment & Plan:   Principal Problem:   Generalized weakness Active Problems:   Essential hypertension   Paroxysmal atrial fibrillation (HCC)   History of CVA (cerebrovascular accident)   CAD (coronary artery disease)   Hyperlipemia   Type II diabetes mellitus with renal manifestations (HCC)   CKD (chronic kidney disease), stage IIIa   Depression with anxiety   Fall   UTI (urinary tract infection)   Seizure (HCC)   Generalized weakness and fall:  -Is most likely in the setting  of UTI, poor ambulatory dysfunction and significant osteoarthritis with deconditioning. -He is complaining with right knee pain, no evidence of bony fracture on imaging, appears to be improving with physical activity and PT, recommendation for SNF placement. -CT head with no acute findings    UTI:  - Patient has increased urinary frequency.  Urinalysis is positive nitrite.  Urine culture growing> 100,000 colonies of E. coli, which is pansensitive, treated with Rocephin.   Essential hypertension -IV hydralazine as needed -Continue home lisinopril   Paroxysmal atrial fibrillation ([place]):  -Continue Eliquis   History of CVA (cerebrovascular accident) -Lipitor -Patient is on Eliquis for A. Fib   CAD (coronary artery disease) -Lipitor   Hyperlipemia -Lipitor   Type II diabetes mellitus with renal manifestations ([place]): No A1c on record.  Blood sugar 236.  Patient is taking glipizide at home -Sliding scale insulin   CKD (chronic kidney disease), stage IIIa: Slightly worsening.  Baseline creatinine less than 1.0.  Her creatinine is 1.012, BUN 8 -IV fluid.  Patient received 1 L normal saline in ED -Monitor closely as resuming Lasix.   Depression with anxiety -Continue home medications   Seizure -Seizure precaution -When necessary Ativan for seizure -Continue Home medications: keppra         DVT prophylaxis: Eliquis Code Status: Full Family Communication: Discussed with son in law and daughter at bedside Disposition:   Status is: Observation  The patient will require care spanning > 2 midnights and should be moved to inpatient because: IV treatments appropriate due to intensity of illness or  inability to take PO  Dispo: The patient is from: Home              Anticipated d/c is to: SNF              Patient currently is medically stable to d/c.  When bed is available, likely by Monday, will obtain COVID test on Sunday.   Difficult to place patient No        Consultants:  No   Subjective:  Days of generalized weakness, she complains of right knee pain as well.  Objective: Vitals:   09/18/20 1945 09/19/20 0013 09/19/20 0437 09/19/20 0823  BP: 131/73 (!) 151/88 (!) 156/96 (!) 154/82  Pulse: 89 92 96 75  Resp: 18 16 18 17   Temp: 98.8 F (37.1 C) 98.6 F (37 C) 98.4 F (36.9 C) 98.5 F (36.9 C)  TempSrc: Oral Oral Oral   SpO2: 100% 90% 100% 94%  Weight:      Height:        Intake/Output Summary (Last 24 hours) at 09/19/2020 1308 Last data filed at 09/19/2020 1039 Gross per 24 hour  Intake 280 ml  Output 850 ml  Net -570 ml   Filed Weights   09/15/20 2352  Weight: 114.3 kg    Examination:  Awake Alert, Oriented X 3, No new F.N deficits, Normal affect Symmetrical Chest wall movement, Good air movement bilaterally, CTAB RRR,No Gallops,Rubs or new Murmurs, No Parasternal Heave +ve B.Sounds, Abd Soft, No tenderness, No rebound - guarding or rigidity. No Cyanosis, Clubbing or edema, No new Rash or bruise       Data Reviewed: I have personally reviewed following labs and imaging studies  CBC: Recent Labs  Lab 09/15/20 2355 09/18/20 0434  WBC 8.4 10.1  HGB 13.0 12.4  HCT 39.1 37.3  MCV 89.7 89.9  PLT 193 540    Basic Metabolic Panel: Recent Labs  Lab 09/15/20 2355 09/17/20 0347 09/18/20 0434  NA 140 139 139  K 4.2 4.0 3.7  CL 106 105 105  CO2 27 29 27   GLUCOSE 236* 150* 183*  BUN 8 9 12   CREATININE 1.12* 0.94 1.13*  CALCIUM 8.6* 8.7* 8.8*    GFR: Estimated Creatinine Clearance: 54.9 mL/min (A) (by C-G formula based on SCr of 1.13 mg/dL (H)).  Liver Function Tests: No results for input(s): AST, ALT, ALKPHOS, BILITOT, PROT, ALBUMIN in the last 168 hours.  CBG: Recent Labs  Lab 09/18/20 1146 09/18/20 1648 09/18/20 2219 09/19/20 0809 09/19/20 1148  GLUCAP 144* 227* 181* 159* 190*     Recent Results (from the past 240 hour(s))  Urine culture     Status: Abnormal   Collection Time: 09/16/20   2:29 AM   Specimen: Urine, Random  Result Value Ref Range Status   Specimen Description   Final    URINE, RANDOM Performed at [facility], [address] [place], [place] [address]    Special Requests   Final    NONE Performed at [facility], [address] [place], [place] [address]    Culture >=100,000 COLONIES/mL ESCHERICHIA COLI (A)  Final   Report Status 09/18/2020 FINAL  Final   Organism ID, Bacteria ESCHERICHIA COLI (A)  Final      Susceptibility   Escherichia coli - MIC*    AMPICILLIN <=2 SENSITIVE Sensitive     CEFAZOLIN <=4 SENSITIVE Sensitive     CEFEPIME <=0.12 SENSITIVE Sensitive     CEFTRIAXONE <=0.25 SENSITIVE Sensitive  CIPROFLOXACIN <=0.25 SENSITIVE Sensitive     GENTAMICIN <=1 SENSITIVE Sensitive     IMIPENEM <=0.25 SENSITIVE Sensitive     NITROFURANTOIN 32 SENSITIVE Sensitive     TRIMETH/SULFA <=20 SENSITIVE Sensitive     AMPICILLIN/SULBACTAM <=2 SENSITIVE Sensitive     PIP/TAZO <=4 SENSITIVE Sensitive     * >=100,000 COLONIES/mL ESCHERICHIA COLI  Resp Panel by RT-PCR (Flu A&B, Covid) Nasopharyngeal Swab     Status: None   Collection Time: 09/16/20  2:45 AM   Specimen: Nasopharyngeal Swab; Nasopharyngeal(NP) swabs in vial transport medium  Result Value Ref Range Status   SARS Coronavirus 2 by RT PCR NEGATIVE NEGATIVE Final    Comment: (NOTE) SARS-CoV-2 target nucleic acids are NOT DETECTED.  The SARS-CoV-2 RNA is generally detectable in upper respiratory specimens during the acute phase of infection. The lowest concentration of SARS-CoV-2 viral copies this assay can detect is 138 copies/mL. A negative result does not preclude SARS-Cov-2 infection and should not be used as the sole basis for treatment or other patient management decisions. A negative result may occur with  improper specimen collection/handling, submission of specimen other than nasopharyngeal swab, presence of viral mutation(s) within the areas targeted by this  assay, and inadequate number of viral copies(<138 copies/mL). A negative result must be combined with clinical observations, patient history, and epidemiological information. The expected result is Negative.  Fact Sheet for Patients:  EntrepreneurPulse.com.au  Fact Sheet for Healthcare Providers:  IncredibleEmployment.be  This test is no t yet approved or cleared by the [place] FDA and  has been authorized for detection and/or diagnosis of SARS-CoV-2 by FDA under an Emergency Use Authorization (EUA). This EUA will remain  in effect (meaning this test can be used) for the duration of the COVID-19 declaration under Section 564(b)(1) of the Act, 21 U.S.C.section 360bbb-3(b)(1), unless the authorization is terminated  or revoked sooner.       Influenza A by PCR NEGATIVE NEGATIVE Final   Influenza B by PCR NEGATIVE NEGATIVE Final    Comment: (NOTE) The Xpert Xpress SARS-CoV-2/FLU/RSV plus assay is intended as an aid in the diagnosis of influenza from Nasopharyngeal swab specimens and should not be used as a sole basis for treatment. Nasal washings and aspirates are unacceptable for Xpert Xpress SARS-CoV-2/FLU/RSV testing.  Fact Sheet for Patients: EntrepreneurPulse.com.au  Fact Sheet for Healthcare Providers: IncredibleEmployment.be  This test is not yet approved or cleared by the [place] FDA and has been authorized for detection and/or diagnosis of SARS-CoV-2 by FDA under an Emergency Use Authorization (EUA). This EUA will remain in effect (meaning this test can be used) for the duration of the COVID-19 declaration under Section 564(b)(1) of the Act, 21 U.S.C. section 360bbb-3(b)(1), unless the authorization is terminated or revoked.  Performed at [facility], [address] [place], [place] [address]          Radiology Studies: No results found.      Scheduled Meds:   apixaban  5 mg Oral BID   atorvastatin  20 mg Oral Daily   DULoxetine  60 mg Oral Daily   furosemide  20 mg Oral Daily   insulin aspart  0-5 Units Subcutaneous QHS   insulin aspart  0-9 Units Subcutaneous TID WC   levETIRAcetam  1,000 mg Oral BID   lisinopril  5 mg Oral Daily   pregabalin  150 mg Oral BID   sodium chloride flush  10 mL Intravenous Q12H   zolpidem  5 mg Oral QHS  Continuous Infusions:   LOS: 2 days       Phillips Climes, MD [facility]   To contact the attending provider between 7A-7P or the covering provider during after hours 7P-7A, please log into the web site [email or web address] and access using universal [place] password for that web site. If you do not have the password, please call the hospital operator.  09/19/2020, 1:08 PM

## 2020-09-20 DIAGNOSIS — N3000 Acute cystitis without hematuria: Secondary | ICD-10-CM | POA: Diagnosis not present

## 2020-09-20 DIAGNOSIS — R531 Weakness: Secondary | ICD-10-CM | POA: Diagnosis not present

## 2020-09-20 LAB — GLUCOSE, CAPILLARY
Glucose-Capillary: 234 mg/dL — ABNORMAL HIGH (ref 70–99)
Glucose-Capillary: 330 mg/dL — ABNORMAL HIGH (ref 70–99)
Glucose-Capillary: 190 mg/dL — ABNORMAL HIGH (ref 70–99)
Glucose-Capillary: 207 mg/dL — ABNORMAL HIGH (ref 70–99)

## 2020-09-20 MED ORDER — TRAMADOL HCL 50 MG PO TABS: 25.0000 mg | ORAL_TABLET | Freq: Two times a day (BID) | ORAL | Status: DC | PRN

## 2020-09-20 MED ORDER — POLYETHYLENE GLYCOL 3350 17 G PO PACK: 34.0000 g | PACK | Freq: Once | ORAL | Status: DC

## 2020-09-20 MED ORDER — ACETAMINOPHEN 325 MG PO TABS
650.0000 mg | ORAL_TABLET | Freq: Four times a day (QID) | ORAL | Status: DC | PRN
  Administered 2020-09-20: 650 mg via ORAL
  Filled 2020-09-20 (×2): qty 2

## 2020-09-20 NOTE — Progress Notes (Signed)
PROGRESS NOTE    Traci Carter  XBM:[id number] DOB: 01-05-1946 DOA: 09/16/2020 PCP: Albina Billet, MD   Chief Complaint  Patient presents with   Weakness    Brief Narrative:   Traci Carter is a 75 y.o. female with medical history significant of hypertension, hyperlipidemia, diabetes mellitus, stroke, seizure, GERD, gout, depression with anxiety, atrial fibrillation on Eliquis, CAD, fibromyalgia, [place] 2014, OSA not on CPAP, pacemaker placement, CAD-3A, who presents with generalized weakness and fall.  Patient states that she has been feeling weak for more than 2 days, which has been progressively worsening.  She states that normally she walks using walker, but now she can barely walking.  No unilateral numbness or tingling in extremities.  No facial droop or slurred speech.  States that she has intermittent mild dry cough, but no chest pain, shortness breath, fever or chills.  Denies nausea, vomiting, diarrhea and abdominal pain.  Patient states that she fell yesterday afternoon.  She states that she has bilateral knee replacement and chronic knee pain, but her knee pain has been worsening after fall.  The right knee is worse than the left.  Patient has increased urinary frequency, but no dysuria or burning on urination.  She states that she is taking Eliquis, her work-up was significant for UTI, she has significant right knee pain, with CT of the right knee with no evidence of fracture or dislocation.   Assessment & Plan:   Principal Problem:   Generalized weakness Active Problems:   Essential hypertension   Paroxysmal atrial fibrillation (HCC)   History of CVA (cerebrovascular accident)   CAD (coronary artery disease)   Hyperlipemia   Type II diabetes mellitus with renal manifestations (HCC)   CKD (chronic kidney disease), stage IIIa   Depression with anxiety   Fall   UTI (urinary tract infection)   Seizure (HCC)   Generalized weakness and fall:  -Is most likely in the setting  of UTI, poor ambulatory dysfunction and significant osteoarthritis with deconditioning. -He is complaining with right knee pain, no evidence of bony fracture on imaging, appears to be improving with physical activity and PT, recommendation for SNF placement. -CT head with no acute findings    UTI:  - Patient has increased urinary frequency.  Urinalysis is positive nitrite.  Urine culture growing> 100,000 colonies of E. coli, which is pansensitive, treated with Rocephin.   Essential hypertension -IV hydralazine as needed -Continue home lisinopril   Paroxysmal atrial fibrillation ([place]):  -Continue Eliquis   History of CVA (cerebrovascular accident) -Lipitor -Patient is on Eliquis for A. Fib   CAD (coronary artery disease) -Lipitor   Hyperlipemia -Lipitor   Type II diabetes mellitus with renal manifestations ([place]): No A1c on record.  Blood sugar 236.  Patient is taking glipizide at home -Sliding scale insulin   CKD (chronic kidney disease), stage IIIa: Slightly worsening.  Baseline creatinine less than 1.0.  Her creatinine is 1.012, BUN 8 -IV fluid.  Patient received 1 L normal saline in ED -Monitor closely as resuming Lasix.   Depression with anxiety -Continue home medications   Seizure -Seizure precaution -When necessary Ativan for seizure -Continue Home medications: keppra   Acute metabolic encephalopathy -Patient appears to be slow to respond today, mildly confused, likely related to hospital delirium, I have stopped her Percocet , will keep on Tylenol for pain she has been only requiring 1 Percocet per day, so I think Tylenol by itself should be sufficient, I will discontinue her Lyrica, but meanwhile  we will keep her on Ambien as I do not want to disturb her sleep/wake cycle, discussed with staff tropin [place], try to set her up on a chair.     DVT prophylaxis: Eliquis Code Status: Full Family Communication: none at bedside today, so I have discussed with  daughter  by phone Disposition:   Status is: Inpatient   The patient will require care spanning > 2 midnights and should be moved to inpatient because: IV treatments appropriate due to intensity of illness or inability to take PO  Dispo: The patient is from: Home              Anticipated d/c is to: SNF              Patient currently is not medically stable to d/c.  She is with some delirium today.  Difficult to place patient No       Consultants:  No   Subjective:  Patient is mildly confused today, no significant event with the patient overnight as I have discussed with staff, patient reports no bowel movement for few days.  Objective: Vitals:   09/19/20 2101 09/20/20 0032 09/20/20 0524 09/20/20 0729  BP: 135/76 (!) 144/82 137/90 (!) 145/92  Pulse: (!) 103 (!) 106 96 95  Resp: 17 17 17 16   Temp: 99.6 F (37.6 C) 99.3 F (37.4 C) 99.3 F (37.4 C) 98.9 F (37.2 C)  TempSrc: Oral Oral Oral Oral  SpO2: 94% 98% 97% 95%  Weight:      Height:        Intake/Output Summary (Last 24 hours) at 09/20/2020 1211 Last data filed at 09/20/2020 1114 Gross per 24 hour  Intake 880 ml  Output 2900 ml  Net -2020 ml   Filed Weights   09/15/20 2352  Weight: 114.3 kg    Examination:  Awake, she is slow to respond, alert x2 only today, with some confusion, she does have no focal deficits.   Symmetrical Chest wall movement, Good air movement bilaterally, CTAB RRR,No Gallops,Rubs or new Murmurs, No Parasternal Heave +ve B.Sounds, Abd Soft, No tenderness, No rebound - guarding or rigidity. No Cyanosis, Clubbing or edema, No new Rash or bruise        Data Reviewed: I have personally reviewed following labs and imaging studies  CBC: Recent Labs  Lab 09/15/20 2355 09/18/20 0434  WBC 8.4 10.1  HGB 13.0 12.4  HCT 39.1 37.3  MCV 89.7 89.9  PLT 193 115    Basic Metabolic Panel: Recent Labs  Lab 09/15/20 2355 09/17/20 0347 09/18/20 0434  NA 140 139 139  K 4.2 4.0 3.7  CL 106 105  105  CO2 27 29 27   GLUCOSE 236* 150* 183*  BUN 8 9 12   CREATININE 1.12* 0.94 1.13*  CALCIUM 8.6* 8.7* 8.8*    GFR: Estimated Creatinine Clearance: 54.9 mL/min (A) (by C-G formula based on SCr of 1.13 mg/dL (H)).  Liver Function Tests: No results for input(s): AST, ALT, ALKPHOS, BILITOT, PROT, ALBUMIN in the last 168 hours.  CBG: Recent Labs  Lab 09/19/20 1639 09/19/20 1719 09/19/20 2145 09/20/20 0731 09/20/20 1207  GLUCAP 220* 176* 209* 234* 330*     Recent Results (from the past 240 hour(s))  Urine culture     Status: Abnormal   Collection Time: 09/16/20  2:29 AM   Specimen: Urine, Random  Result Value Ref Range Status   Specimen Description   Final    URINE, RANDOM Performed at [facility],  [place], [place] [address]    Special Requests   Final    NONE Performed at [facility], [place], [place] [address]    Culture >=100,000 COLONIES/mL ESCHERICHIA COLI (A)  Final   Report Status 09/18/2020 FINAL  Final   Organism ID, Bacteria ESCHERICHIA COLI (A)  Final      Susceptibility   Escherichia coli - MIC*    AMPICILLIN <=2 SENSITIVE Sensitive     CEFAZOLIN <=4 SENSITIVE Sensitive     CEFEPIME <=0.12 SENSITIVE Sensitive     CEFTRIAXONE <=0.25 SENSITIVE Sensitive     CIPROFLOXACIN <=0.25 SENSITIVE Sensitive     GENTAMICIN <=1 SENSITIVE Sensitive     IMIPENEM <=0.25 SENSITIVE Sensitive     NITROFURANTOIN 32 SENSITIVE Sensitive     TRIMETH/SULFA <=20 SENSITIVE Sensitive     AMPICILLIN/SULBACTAM <=2 SENSITIVE Sensitive     PIP/TAZO <=4 SENSITIVE Sensitive     * >=100,000 COLONIES/mL ESCHERICHIA COLI  Resp Panel by RT-PCR (Flu A&B, Covid) Nasopharyngeal Swab     Status: None   Collection Time: 09/16/20  2:45 AM   Specimen: Nasopharyngeal Swab; Nasopharyngeal(NP) swabs in vial transport medium  Result Value Ref Range Status   SARS Coronavirus 2 by RT PCR NEGATIVE NEGATIVE Final    Comment: (NOTE) SARS-CoV-2  target nucleic acids are NOT DETECTED.  The SARS-CoV-2 RNA is generally detectable in upper respiratory specimens during the acute phase of infection. The lowest concentration of SARS-CoV-2 viral copies this assay can detect is 138 copies/mL. A negative result does not preclude SARS-Cov-2 infection and should not be used as the sole basis for treatment or other patient management decisions. A negative result may occur with  improper specimen collection/handling, submission of specimen other than nasopharyngeal swab, presence of viral mutation(s) within the areas targeted by this assay, and inadequate number of viral copies(<138 copies/mL). A negative result must be combined with clinical observations, patient history, and epidemiological information. The expected result is Negative.  Fact Sheet for Patients:  EntrepreneurPulse.com.au  Fact Sheet for Healthcare Providers:  IncredibleEmployment.be  This test is no t yet approved or cleared by the [place] FDA and  has been authorized for detection and/or diagnosis of SARS-CoV-2 by FDA under an Emergency Use Authorization (EUA). This EUA will remain  in effect (meaning this test can be used) for the duration of the COVID-19 declaration under Section 564(b)(1) of the Act, 21 U.S.C.section 360bbb-3(b)(1), unless the authorization is terminated  or revoked sooner.       Influenza A by PCR NEGATIVE NEGATIVE Final   Influenza B by PCR NEGATIVE NEGATIVE Final    Comment: (NOTE) The Xpert Xpress SARS-CoV-2/FLU/RSV plus assay is intended as an aid in the diagnosis of influenza from Nasopharyngeal swab specimens and should not be used as a sole basis for treatment. Nasal washings and aspirates are unacceptable for Xpert Xpress SARS-CoV-2/FLU/RSV testing.  Fact Sheet for Patients: EntrepreneurPulse.com.au  Fact Sheet for Healthcare  Providers: IncredibleEmployment.be  This test is not yet approved or cleared by the [place] FDA and has been authorized for detection and/or diagnosis of SARS-CoV-2 by FDA under an Emergency Use Authorization (EUA). This EUA will remain in effect (meaning this test can be used) for the duration of the COVID-19 declaration under Section 564(b)(1) of the Act, 21 U.S.C. section 360bbb-3(b)(1), unless the authorization is terminated or revoked.  Performed at [facility], [address] [place], [place] [address]  Radiology Studies: No results found.      Scheduled Meds:  apixaban  5 mg Oral BID   atorvastatin  20 mg Oral Daily   DULoxetine  60 mg Oral Daily   furosemide  20 mg Oral Daily   insulin aspart  0-5 Units Subcutaneous QHS   insulin aspart  0-9 Units Subcutaneous TID WC   levETIRAcetam  1,000 mg Oral BID   lisinopril  5 mg Oral Daily   pregabalin  150 mg Oral BID   senna-docusate  2 tablet Oral BID   sodium chloride flush  10 mL Intravenous Q12H   zolpidem  5 mg Oral QHS   Continuous Infusions:   LOS: 3 days       Phillips Climes, MD [facility]   To contact the attending provider between 7A-7P or the covering provider during after hours 7P-7A, please log into the web site [email or web address] and access using universal [place] password for that web site. If you do not have the password, please call the hospital operator.  09/20/2020, 12:11 PM

## 2020-09-21 DIAGNOSIS — N390 Urinary tract infection, site not specified: Principal | ICD-10-CM

## 2020-09-21 DIAGNOSIS — R531 Weakness: Secondary | ICD-10-CM | POA: Diagnosis not present

## 2020-09-21 DIAGNOSIS — R569 Unspecified convulsions: Secondary | ICD-10-CM

## 2020-09-21 DIAGNOSIS — N1831 Chronic kidney disease, stage 3a: Secondary | ICD-10-CM | POA: Diagnosis not present

## 2020-09-21 LAB — GLUCOSE, CAPILLARY
Glucose-Capillary: 213 mg/dL — ABNORMAL HIGH (ref 70–99)
Glucose-Capillary: 229 mg/dL — ABNORMAL HIGH (ref 70–99)

## 2020-09-21 LAB — RESP PANEL BY RT-PCR (FLU A&B, COVID) ARPGX2
Influenza A by PCR: NEGATIVE
Influenza B by PCR: NEGATIVE
SARS Coronavirus 2 by RT PCR: NEGATIVE

## 2020-09-21 MED ORDER — POLYETHYLENE GLYCOL 3350 17 G PO PACK: 34.0000 g | PACK | Freq: Every day | ORAL | 0 refills | Status: DC | PRN

## 2020-09-21 MED ORDER — LOPERAMIDE HCL 1 MG/7.5ML PO SUSP: 1.0000 mg | Freq: Every day | ORAL | 0 refills | Status: DC | PRN

## 2020-09-21 MED ORDER — SENNOSIDES-DOCUSATE SODIUM 8.6-50 MG PO TABS: 2.0000 | ORAL_TABLET | Freq: Two times a day (BID) | ORAL | Status: DC

## 2020-09-21 MED ORDER — BISACODYL 10 MG RE SUPP
10.0000 mg | Freq: Once | RECTAL | Status: DC
  Filled 2020-09-21: qty 1

## 2020-09-21 MED ORDER — PREGABALIN 50 MG PO CAPS: 50.0000 mg | ORAL_CAPSULE | Freq: Two times a day (BID) | ORAL | 2 refills | Status: DC

## 2020-09-21 MED ORDER — POLYETHYLENE GLYCOL 3350 17 G PO PACK
34.0000 g | PACK | Freq: Once | ORAL | Status: DC
  Filled 2020-09-21: qty 2

## 2020-09-21 MED ORDER — INSULIN ASPART 100 UNIT/ML IJ SOLN: 0.0000 [IU] | Freq: Three times a day (TID) | INTRAMUSCULAR | 11 refills | Status: DC

## 2020-09-21 MED ORDER — ZOLPIDEM TARTRATE 5 MG PO TABS: 5.0000 mg | ORAL_TABLET | Freq: Every evening | ORAL | 0 refills | Status: DC | PRN

## 2020-09-21 MED ORDER — ACETAMINOPHEN 325 MG PO TABS: 650.0000 mg | ORAL_TABLET | Freq: Four times a day (QID) | ORAL | Status: DC | PRN

## 2020-09-21 MED ORDER — COLCHICINE 0.6 MG PO TABS: 0.6000 mg | ORAL_TABLET | Freq: Every day | ORAL | Status: DC | PRN

## 2020-09-21 MED ORDER — ALBUTEROL SULFATE (2.5 MG/3ML) 0.083% IN NEBU: 3.0000 mL | INHALATION_SOLUTION | RESPIRATORY_TRACT | 12 refills | Status: DC | PRN

## 2020-09-21 NOTE — Progress Notes (Addendum)
Discharge Note: Reported to Philbert Riser, RN at [facility].  Reviewed discharge summary. Notified MD, facility will need hard script for med: Ambien.  Facility Nurse verbalized understanding.

## 2020-09-21 NOTE — TOC Transition Note (Signed)
Transition of Care [facility]) - CM/SW Discharge Note   Patient Details  Name: Traci Carter MRN: [id number] Date of Birth: 03/08/1946  [facility] CM/SW Contact:  Su Hilt, RN Phone Number: 09/21/2020, 2:57 PM   Clinical Narrative:      Patient to go to [facility] room 503 transported by [employer], she is second on the list, Adonis Brook the [atient's daughter is aware       Patient Goals and CMS Choice        Discharge Placement                       Discharge Plan and Services                                     Social Determinants of Health (SDOH) Interventions     Readmission Risk Interventions No flowsheet data found.

## 2020-09-21 NOTE — TOC Progression Note (Signed)
Transition of Care [facility]) - Progression Note    Patient Details  Name: Traci Carter MRN: [id number] Date of Birth: Aug 06, 1945  Transition of Care [facility]) CM/SW [place], RN Phone Number: 09/21/2020, 10:00 AM  Clinical Narrative:      Received a message from Cristie the patient's daughter, called back and answered that yes the patient is to go to [facility] today room 503, left contact information for a call back      Expected Discharge Plan and Services                                                 Social Determinants of Health (SDOH) Interventions    Readmission Risk Interventions No flowsheet data found.

## 2020-09-21 NOTE — Care Management Important Message (Signed)
Important Message  Patient Details  Name: Traci Carter MRN: [id number] Date of Birth: 11-28-1945   Medicare Important Message Given:  Other (see comment)  Patient is in an isolation room so I called her room [phone]) but the line was busy. Will try again.   Juliann Pulse A Travion Ke 09/21/2020, 11:22 AM

## 2020-09-21 NOTE — Progress Notes (Signed)
EMS transported pt from [facility] to [facility].

## 2020-09-21 NOTE — Care Management Important Message (Signed)
Important Message  Patient Details  Name: Traci GUERRIERI MRN: [id number] Date of Birth: July 24, 1945   Medicare Important Message Given:  Yes  Patient is in an isolation room and talked with her by phone [phone]) and reviewed her Important Message from Medicare with her.  She is in agreement with her discharge today to SNF.  I asked if she would like me send her a copy and she replied no.  I wished her a speedy recover and thanked her for time.  Traci Carter A Airon Sahni 09/21/2020, 11:29 AM

## 2020-09-21 NOTE — Discharge Summary (Addendum)
Physician Discharge Summary  SERENITIE VINTON SFK:[id number] DOB: Dec 26, 1945 DOA: 09/16/2020  PCP: Albina Billet, MD  Admit date: 09/16/2020 Discharge date: 09/21/2020  Admitted From: Home Disposition:  SNF  Recommendations for Outpatient Follow-up:  Follow up with PCP in 1-2 weeks Please obtain BMP/CBC in one week Please follow up on the following pending results:  Home Health:NO Equipment/Devices:NO  Discharge Condition:Stable CODE STATUS:FULL, Diet recommendation: Heart Healthy / Carb Modified  Brief/Interim Summary:  CAASI GIGLIA is a 75 y.o. female with medical history significant of hypertension, hyperlipidemia, diabetes mellitus, stroke, seizure, GERD, gout, depression with anxiety, atrial fibrillation on Eliquis, CAD, fibromyalgia, ICH 2014, OSA not on CPAP, pacemaker placement, CAD-3A, who presents with generalized weakness and fall.  Patient has increased urinary frequency, but no dysuria or burning on urination.  She states that she is taking Eliquis, her work-up was significant for UTI, she has significant right knee pain, with CT of the right knee with no evidence of fracture or dislocation.    Generalized weakness and fall:  -Is most likely in the setting of UTI, poor ambulatory dysfunction and significant osteoarthritis with deconditioning.  She is complaining of right knee pain, CT right knee with no acute fracture or dislocation, pain has significantly improved, currently controlled with Tylenol.. -appears to be improving with physical activity and PT, recommendation for rehab.. -CT head with no acute findings  UTI:  - Patient has increased urinary frequency.  Urinalysis is positive nitrite.  Urine culture growing> 100,000 colonies of E. coli, which is pansensitive, treated with Rocephin.  No need for further antibiotics on discharge.   Essential hypertension -IV hydralazine as needed -Continue home lisinopril   Paroxysmal atrial fibrillation ([place]): -Continue  Eliquis   History of CVA (cerebrovascular accident) -Lipitor -Patient is on Eliquis for A. Fib   CAD (coronary artery disease) -Lipitor   Hyperlipemia -Lipitor   Type II diabetes mellitus with renal manifestations ([place]):  - Patient is glipizide 5 mg oral daily, I will hold it on discharge as she will remain on sliding scale at the facility.    CKD (chronic kidney disease), stage IIIa:  -Renal function at baseline, continue to monitor closely as she is on Lasix  Depression with anxiety -Patient appears to be on multiple medications which she has not been taking, we will continue her on Cymbalta which she reports it was only when she was taking, she was not taking BuSpar, I will continue to hold especially with hospital delirium, I will resume Lyrica at a lower dose.    Seizure -Seizure precaution -Continue Home medications: keppra   Acute metabolic encephalopathy -Patient with sundowning/hospital delirium, her pain is controlled only on Tylenol, so Percocet has been stopped, no indication for narcotics on discharge, as well on her home medication list with BuSpar, Lexapro which she has not been taking, so they will be discontinued especially with hospital delirium, will be discharged on Cymbalta, I will lower her Lyrica dose on discharge as well.       Discharge Diagnoses:  Principal Problem:   Generalized weakness Active Problems:   Essential hypertension   Paroxysmal atrial fibrillation (HCC)   History of CVA (cerebrovascular accident)   CAD (coronary artery disease)   Hyperlipemia   Type II diabetes mellitus with renal manifestations (HCC)   CKD (chronic kidney disease), stage IIIa   Depression with anxiety   Fall   UTI (urinary tract infection)   Seizure [facility])    Discharge Instructions  Discharge Instructions  Diet - low sodium heart healthy   Complete by: As directed    Increase activity slowly   Complete by: As directed       Allergies as of 09/21/2020        Reactions   Codeine Other (See Comments)   GI Distress   Lanoxin [digoxin]    Zoloft [sertraline Hcl]    Penicillins Rash        Medication List     STOP taking these medications    albuterol 108 (90 Base) MCG/ACT inhaler Commonly known as: VENTOLIN HFA Replaced by: albuterol (2.5 MG/3ML) 0.083% nebulizer solution   busPIRone 10 MG tablet Commonly known as: BUSPAR   busPIRone 30 MG tablet Commonly known as: BUSPAR   citalopram 10 MG tablet Commonly known as: CELEXA   clindamycin 300 MG capsule Commonly known as: CLEOCIN   escitalopram 20 MG tablet Commonly known as: LEXAPRO   glipiZIDE 5 MG tablet Commonly known as: GLUCOTROL   Loperamide HCl 1 MG/7.5ML Liqd   LORazepam 1 MG tablet Commonly known as: ATIVAN       TAKE these medications    acetaminophen 325 MG tablet Commonly known as: TYLENOL Take 2 tablets (650 mg total) by mouth every 6 (six) hours as needed for mild pain, headache, fever or moderate pain.   albuterol (2.5 MG/3ML) 0.083% nebulizer solution Commonly known as: PROVENTIL Inhale 3 mLs into the lungs every 4 (four) hours as needed for wheezing or shortness of breath. Replaces: albuterol 108 (90 Base) MCG/ACT inhaler   apixaban 5 MG Tabs tablet Commonly known as: ELIQUIS Take 1 tablet (5 mg total) by mouth 2 (two) times daily.   atorvastatin 20 MG tablet Commonly known as: LIPITOR Take 20 mg by mouth daily.   bismuth subsalicylate 462 VO/35KK suspension Commonly known as: PEPTO BISMOL Take 30 mLs by mouth daily.   colchicine 0.6 MG tablet Take 1 tablet (0.6 mg total) by mouth daily as needed (gout). What changed: See the new instructions.   DULoxetine 60 MG capsule Commonly known as: CYMBALTA Take 60 mg by mouth daily. What changed: Another medication with the same name was removed. Continue taking this medication, and follow the directions you see here.   famotidine 20 MG tablet Commonly known as: PEPCID Take 20 mg  by mouth daily.   furosemide 20 MG tablet Commonly known as: LASIX Take 1 tablet (20 mg total) by mouth daily.   insulin aspart 100 UNIT/ML injection Commonly known as: novoLOG Inject 0-9 Units into the skin 3 (three) times daily with meals.   levETIRAcetam 1000 MG tablet Commonly known as: KEPPRA Take 1 tablet (1,000 mg total) by mouth 2 (two) times daily.   lisinopril 5 MG tablet Commonly known as: ZESTRIL Take 1 tablet (5 mg total) by mouth daily.   montelukast 10 MG tablet Commonly known as: SINGULAIR   polyethylene glycol 17 g packet Commonly known as: MIRALAX / GLYCOLAX Take 34 g by mouth daily as needed.   pregabalin 50 MG capsule Commonly known as: Lyrica Take 1 capsule (50 mg total) by mouth 2 (two) times daily. What changed:  when to take this Another medication with the same name was removed. Continue taking this medication, and follow the directions you see here.   senna-docusate 8.6-50 MG tablet Commonly known as: Senokot-S Take 2 tablets by mouth 2 (two) times daily.   zolpidem 5 MG tablet Commonly known as: AMBIEN Take 1 tablet (5 mg total) by mouth at bedtime as needed  for sleep. What changed:  medication strength how much to take when to take this reasons to take this        Contact information for after-discharge care     Destination     [facility] .   Service: Skilled Nursing Contact information: [place] [phone]                    Allergies  Allergen Reactions   Codeine Other (See Comments)    GI Distress   Lanoxin [Digoxin]    Zoloft [Sertraline Hcl]    Penicillins Rash    Consultations: None   Procedures/Studies: DG Chest 2 View  Result Date: 09/16/2020 CLINICAL DATA:  Weakness EXAM: CHEST - 2 VIEW COMPARISON:  08/02/2012 FINDINGS: Left pacer remains in place, unchanged. Cardiomegaly.  Lingular scarring or atelectasis. Right lung clear. No effusions or acute bony abnormality. Aortic atherosclerosis. IMPRESSION: Cardiomegaly.  No active disease. Lingular scarring or atelectasis. Electronically Signed   By: Rolm Baptise M.D.   On: 09/16/2020 00:34   DG Pelvis 1-2 Views  Result Date: 09/16/2020 CLINICAL DATA:  Fall EXAM: PELVIS - 1-2 VIEW COMPARISON:  None. FINDINGS: There is no evidence of pelvic fracture or diastasis. No pelvic bone lesions are seen. IMPRESSION: Negative. Electronically Signed   By: Ulyses Jarred M.D.   On: 09/16/2020 03:54   CT HEAD WO CONTRAST  Result Date: 09/16/2020 CLINICAL DATA:  Headaches and recent fall, initial encounter EXAM: CT HEAD WITHOUT CONTRAST TECHNIQUE: Contiguous axial images were obtained from the base of the skull through the vertex without intravenous contrast. COMPARISON:  None. FINDINGS: Brain: No evidence of acute infarction, hemorrhage, hydrocephalus, extra-axial collection or mass lesion/mass effect. Mild chronic white matter ischemic changes are noted. Vascular: No hyperdense vessel or unexpected calcification. Skull: Normal. Negative for fracture or focal lesion. Sinuses/Orbits: No acute finding. Other: None. IMPRESSION: Chronic white matter ischemic changes without acute abnormality. Electronically Signed   By: Inez Catalina M.D.   On: 09/16/2020 09:10   CT KNEE RIGHT WO CONTRAST  Result Date: 09/16/2020 CLINICAL DATA:  Right knee pain and throbbing since a fall yesterday. History of prior right knee replacement. EXAM: CT OF THE RIGHT KNEE WITHOUT CONTRAST TECHNIQUE: Multidetector CT imaging of the right knee was performed according to the standard protocol. Multiplanar CT image reconstructions were also generated. COMPARISON:  Plain films right knee today and 07/06/2004. FINDINGS: Bones/Joint/Cartilage There is some streak artifact on the examination from the patient's arthroplasty. No fracture, dislocation or other acute bony abnormality  is seen. No evidence of hardware complication related to the patient's arthroplasty is identified. No focal bony lesion. Small knee joint effusion noted. Ligaments Suboptimally assessed by CT. Muscles and Tendons Visualization is limited but is seen. All imaged muscle bellies appear mildly to moderately atrophic likely secondary to disuse. Soft tissues Areas of infiltration of subcutaneous fat are seen medially and posteriorly about the knee. No mass. IMPRESSION: Negative for acute bony or joint abnormality. Infiltration of subcutaneous fat about the knee may be due to contusions given the patient's history of fall. Status post knee replacement. No hardware complication. Small joint effusion noted. Electronically Signed   By: Inge Rise M.D.   On: 09/16/2020 09:19   DG Knee Complete 4 Views Left  Result Date: 09/16/2020 CLINICAL DATA:  Fall EXAM: LEFT KNEE - COMPLETE 4+ VIEW COMPARISON:  None. FINDINGS: Small suprapatellar effusion. Total  knee arthroplasty without adverse features. No fracture or dislocation. Vascular calcifications. IMPRESSION: Small suprapatellar effusion without fracture or dislocation. Total knee arthroplasty without adverse features. Electronically Signed   By: Ulyses Jarred M.D.   On: 09/16/2020 03:56   DG Knee Complete 4 Views Right  Result Date: 09/16/2020 CLINICAL DATA:  Fall EXAM: RIGHT KNEE - COMPLETE 4+ VIEW COMPARISON:  None. FINDINGS: Right total knee arthroplasty. No fracture or dislocation. Small suprapatellar effusion. IMPRESSION: No fracture or dislocation of the right knee. Small suprapatellar effusion. Electronically Signed   By: Ulyses Jarred M.D.   On: 09/16/2020 03:55   CT NO CHARGE  Result Date: 09/17/2020 CLINICAL DATA:  Right knee pain and throbbing following fall. EXAM: CT ADDITIONAL VIEWS AT NO CHARGE TECHNIQUE: Repeat imaging was performed in error. This examination was performed on 09/16/2020. CONTRAST:  None COMPARISON:  09/16/2020 FINDINGS: No new  findings identified. No interval change since prior examination. IMPRESSION: This examination was performed previously on 09/16/2020. Please refer to that report for further details. Electronically Signed   By: Fidela Salisbury MD   On: 09/17/2020 11:35     Subjective: No acute events overnight as discussed with staff, her pain is controlled with Tylenol, patient denies nausea, vomiting, no dysuria.  Discharge Exam: Vitals:   09/21/20 0501 09/21/20 0737  BP: 135/82 (!) 143/80  Pulse: 100 95  Resp: 16 20  Temp: 98.8 F (37.1 C) 98.4 F (36.9 C)  SpO2: 96% 94%   Vitals:   09/20/20 1606 09/20/20 1942 09/21/20 0501 09/21/20 0737  BP: 130/72 109/70 135/82 (!) 143/80  Pulse: 96 92 100 95  Resp: 16 17 16 20   Temp: 97.9 F (36.6 C) 98.6 F (37 C) 98.8 F (37.1 C) 98.4 F (36.9 C)  TempSrc: Oral Oral Oral Oral  SpO2: 95% 97% 96% 94%  Weight:      Height:        General: Pt is alert, awake, she is alert x2 currently, aware it is 2022, cannot recall month or date, she slow to respond, mildly confused.  Not in acute distress Cardiovascular: RRR, S1/S2 +, no rubs, no gallops Respiratory: CTA bilaterally, no wheezing, no rhonchi Abdominal: Soft, NT, ND, bowel sounds + Extremities: no edema, no cyanosis    The results of significant diagnostics from this hospitalization (including imaging, microbiology, ancillary and laboratory) are listed below for reference.     Microbiology: Recent Results (from the past 240 hour(s))  Urine culture     Status: Abnormal   Collection Time: 09/16/20  2:29 AM   Specimen: Urine, Random  Result Value Ref Range Status   Specimen Description   Final    URINE, RANDOM Performed at [facility], [address], [place], [place] [address]    Special Requests   Final    NONE Performed at [facility], [address] [place], [place] [address]    Culture >=100,000 COLONIES/mL ESCHERICHIA COLI (A)  Final   Report Status 09/18/2020  FINAL  Final   Organism ID, Bacteria ESCHERICHIA COLI (A)  Final      Susceptibility   Escherichia coli - MIC*    AMPICILLIN <=2 SENSITIVE Sensitive     CEFAZOLIN <=4 SENSITIVE Sensitive     CEFEPIME <=0.12 SENSITIVE Sensitive     CEFTRIAXONE <=0.25 SENSITIVE Sensitive     CIPROFLOXACIN <=0.25 SENSITIVE Sensitive     GENTAMICIN <=1 SENSITIVE Sensitive     IMIPENEM <=0.25 SENSITIVE Sensitive     NITROFURANTOIN 32 SENSITIVE  Sensitive     TRIMETH/SULFA <=20 SENSITIVE Sensitive     AMPICILLIN/SULBACTAM <=2 SENSITIVE Sensitive     PIP/TAZO <=4 SENSITIVE Sensitive     * >=100,000 COLONIES/mL ESCHERICHIA COLI  Resp Panel by RT-PCR (Flu A&B, Covid) Nasopharyngeal Swab     Status: None   Collection Time: 09/16/20  2:45 AM   Specimen: Nasopharyngeal Swab; Nasopharyngeal(NP) swabs in vial transport medium  Result Value Ref Range Status   SARS Coronavirus 2 by RT PCR NEGATIVE NEGATIVE Final    Comment: (NOTE) SARS-CoV-2 target nucleic acids are NOT DETECTED.  The SARS-CoV-2 RNA is generally detectable in upper respiratory specimens during the acute phase of infection. The lowest concentration of SARS-CoV-2 viral copies this assay can detect is 138 copies/mL. A negative result does not preclude SARS-Cov-2 infection and should not be used as the sole basis for treatment or other patient management decisions. A negative result may occur with  improper specimen collection/handling, submission of specimen other than nasopharyngeal swab, presence of viral mutation(s) within the areas targeted by this assay, and inadequate number of viral copies(<138 copies/mL). A negative result must be combined with clinical observations, patient history, and epidemiological information. The expected result is Negative.  Fact Sheet for Patients:  EntrepreneurPulse.com.au  Fact Sheet for Healthcare Providers:  IncredibleEmployment.be  This test is no t yet approved or  cleared by the [place] FDA and  has been authorized for detection and/or diagnosis of SARS-CoV-2 by FDA under an Emergency Use Authorization (EUA). This EUA will remain  in effect (meaning this test can be used) for the duration of the COVID-19 declaration under Section 564(b)(1) of the Act, 21 U.S.C.section 360bbb-3(b)(1), unless the authorization is terminated  or revoked sooner.       Influenza A by PCR NEGATIVE NEGATIVE Final   Influenza B by PCR NEGATIVE NEGATIVE Final    Comment: (NOTE) The Xpert Xpress SARS-CoV-2/FLU/RSV plus assay is intended as an aid in the diagnosis of influenza from Nasopharyngeal swab specimens and should not be used as a sole basis for treatment. Nasal washings and aspirates are unacceptable for Xpert Xpress SARS-CoV-2/FLU/RSV testing.  Fact Sheet for Patients: EntrepreneurPulse.com.au  Fact Sheet for Healthcare Providers: IncredibleEmployment.be  This test is not yet approved or cleared by the [place] FDA and has been authorized for detection and/or diagnosis of SARS-CoV-2 by FDA under an Emergency Use Authorization (EUA). This EUA will remain in effect (meaning this test can be used) for the duration of the COVID-19 declaration under Section 564(b)(1) of the Act, 21 U.S.C. section 360bbb-3(b)(1), unless the authorization is terminated or revoked.  Performed at [facility], [address] [place], [place] [address]   Resp Panel by RT-PCR (Flu A&B, Covid) Nasopharyngeal Swab     Status: None   Collection Time: 09/21/20 10:00 AM   Specimen: Nasopharyngeal Swab; Nasopharyngeal(NP) swabs in vial transport medium  Result Value Ref Range Status   SARS Coronavirus 2 by RT PCR NEGATIVE NEGATIVE Final    Comment: (NOTE) SARS-CoV-2 target nucleic acids are NOT DETECTED.  The SARS-CoV-2 RNA is generally detectable in upper respiratory specimens during the acute phase of infection. The  lowest concentration of SARS-CoV-2 viral copies this assay can detect is 138 copies/mL. A negative result does not preclude SARS-Cov-2 infection and should not be used as the sole basis for treatment or other patient management decisions. A negative result may occur with  improper specimen collection/handling, submission of specimen other than nasopharyngeal swab, presence of viral mutation(s) within  the areas targeted by this assay, and inadequate number of viral copies(<138 copies/mL). A negative result must be combined with clinical observations, patient history, and epidemiological information. The expected result is Negative.  Fact Sheet for Patients:  EntrepreneurPulse.com.au  Fact Sheet for Healthcare Providers:  IncredibleEmployment.be  This test is no t yet approved or cleared by the [place] FDA and  has been authorized for detection and/or diagnosis of SARS-CoV-2 by FDA under an Emergency Use Authorization (EUA). This EUA will remain  in effect (meaning this test can be used) for the duration of the COVID-19 declaration under Section 564(b)(1) of the Act, 21 U.S.C.section 360bbb-3(b)(1), unless the authorization is terminated  or revoked sooner.       Influenza A by PCR NEGATIVE NEGATIVE Final   Influenza B by PCR NEGATIVE NEGATIVE Final    Comment: (NOTE) The Xpert Xpress SARS-CoV-2/FLU/RSV plus assay is intended as an aid in the diagnosis of influenza from Nasopharyngeal swab specimens and should not be used as a sole basis for treatment. Nasal washings and aspirates are unacceptable for Xpert Xpress SARS-CoV-2/FLU/RSV testing.  Fact Sheet for Patients: EntrepreneurPulse.com.au  Fact Sheet for Healthcare Providers: IncredibleEmployment.be  This test is not yet approved or cleared by the [place] FDA and has been authorized for detection and/or diagnosis of SARS-CoV-2 by FDA under  an Emergency Use Authorization (EUA). This EUA will remain in effect (meaning this test can be used) for the duration of the COVID-19 declaration under Section 564(b)(1) of the Act, 21 U.S.C. section 360bbb-3(b)(1), unless the authorization is terminated or revoked.  Performed at [facility], [address] [place], [place] [address]      Labs: BNP (last 3 results) Recent Labs    09/16/20 0013  BNP 570.1*   Basic Metabolic Panel: Recent Labs  Lab 09/15/20 2355 09/17/20 0347 09/18/20 0434  NA 140 139 139  K 4.2 4.0 3.7  CL 106 105 105  CO2 27 29 27   GLUCOSE 236* 150* 183*  BUN 8 9 12   CREATININE 1.12* 0.94 1.13*  CALCIUM 8.6* 8.7* 8.8*   Liver Function Tests: No results for input(s): AST, ALT, ALKPHOS, BILITOT, PROT, ALBUMIN in the last 168 hours. No results for input(s): LIPASE, AMYLASE in the last 168 hours. No results for input(s): AMMONIA in the last 168 hours. CBC: Recent Labs  Lab 09/15/20 2355 09/18/20 0434  WBC 8.4 10.1  HGB 13.0 12.4  HCT 39.1 37.3  MCV 89.7 89.9  PLT 193 202   Cardiac Enzymes: Recent Labs  Lab 09/16/20 0245  CKTOTAL 56   BNP: Invalid input(s): POCBNP CBG: Recent Labs  Lab 09/20/20 1207 09/20/20 1625 09/20/20 1944 09/21/20 0756 09/21/20 1105  GLUCAP 330* 190* 207* 213* 229*   D-Dimer No results for input(s): DDIMER in the last 72 hours. Hgb A1c No results for input(s): HGBA1C in the last 72 hours. Lipid Profile No results for input(s): CHOL, HDL, LDLCALC, TRIG, CHOLHDL, LDLDIRECT in the last 72 hours. Thyroid function studies No results for input(s): TSH, T4TOTAL, T3FREE, THYROIDAB in the last 72 hours.  Invalid input(s): FREET3 Anemia work up No results for input(s): VITAMINB12, FOLATE, FERRITIN, TIBC, IRON, RETICCTPCT in the last 72 hours. Urinalysis    Component Value Date/Time   COLORURINE YELLOW (A) 09/16/2020 0229   APPEARANCEUR CLEAR (A) 09/16/2020 0229   APPEARANCEUR Clear 07/21/2012 1003    LABSPEC 1.004 (L) 09/16/2020 0229   LABSPEC 1.010 07/21/2012 1003   PHURINE 5.0 09/16/2020 0229   GLUCOSEU NEGATIVE  09/16/2020 0229   GLUCOSEU 50 mg/dL 07/21/2012 1003   HGBUR NEGATIVE 09/16/2020 0229   BILIRUBINUR NEGATIVE 09/16/2020 0229   BILIRUBINUR Negative 07/21/2012 1003   KETONESUR NEGATIVE 09/16/2020 0229   PROTEINUR NEGATIVE 09/16/2020 0229   NITRITE POSITIVE (A) 09/16/2020 0229   LEUKOCYTESUR NEGATIVE 09/16/2020 0229   LEUKOCYTESUR Negative 07/21/2012 1003   Sepsis Labs Invalid input(s): PROCALCITONIN,  WBC,  LACTICIDVEN Microbiology Recent Results (from the past 240 hour(s))  Urine culture     Status: Abnormal   Collection Time: 09/16/20  2:29 AM   Specimen: Urine, Random  Result Value Ref Range Status   Specimen Description   Final    URINE, RANDOM Performed at [facility], [address], [place], [place] [address]    Special Requests   Final    NONE Performed at [facility], [address] [place], [place] [address]    Culture >=100,000 COLONIES/mL ESCHERICHIA COLI (A)  Final   Report Status 09/18/2020 FINAL  Final   Organism ID, Bacteria ESCHERICHIA COLI (A)  Final      Susceptibility   Escherichia coli - MIC*    AMPICILLIN <=2 SENSITIVE Sensitive     CEFAZOLIN <=4 SENSITIVE Sensitive     CEFEPIME <=0.12 SENSITIVE Sensitive     CEFTRIAXONE <=0.25 SENSITIVE Sensitive     CIPROFLOXACIN <=0.25 SENSITIVE Sensitive     GENTAMICIN <=1 SENSITIVE Sensitive     IMIPENEM <=0.25 SENSITIVE Sensitive     NITROFURANTOIN 32 SENSITIVE Sensitive     TRIMETH/SULFA <=20 SENSITIVE Sensitive     AMPICILLIN/SULBACTAM <=2 SENSITIVE Sensitive     PIP/TAZO <=4 SENSITIVE Sensitive     * >=100,000 COLONIES/mL ESCHERICHIA COLI  Resp Panel by RT-PCR (Flu A&B, Covid) Nasopharyngeal Swab     Status: None   Collection Time: 09/16/20  2:45 AM   Specimen: Nasopharyngeal Swab; Nasopharyngeal(NP) swabs in vial transport medium  Result Value Ref Range Status    SARS Coronavirus 2 by RT PCR NEGATIVE NEGATIVE Final    Comment: (NOTE) SARS-CoV-2 target nucleic acids are NOT DETECTED.  The SARS-CoV-2 RNA is generally detectable in upper respiratory specimens during the acute phase of infection. The lowest concentration of SARS-CoV-2 viral copies this assay can detect is 138 copies/mL. A negative result does not preclude SARS-Cov-2 infection and should not be used as the sole basis for treatment or other patient management decisions. A negative result may occur with  improper specimen collection/handling, submission of specimen other than nasopharyngeal swab, presence of viral mutation(s) within the areas targeted by this assay, and inadequate number of viral copies(<138 copies/mL). A negative result must be combined with clinical observations, patient history, and epidemiological information. The expected result is Negative.  Fact Sheet for Patients:  EntrepreneurPulse.com.au  Fact Sheet for Healthcare Providers:  IncredibleEmployment.be  This test is no t yet approved or cleared by the [place] FDA and  has been authorized for detection and/or diagnosis of SARS-CoV-2 by FDA under an Emergency Use Authorization (EUA). This EUA will remain  in effect (meaning this test can be used) for the duration of the COVID-19 declaration under Section 564(b)(1) of the Act, 21 U.S.C.section 360bbb-3(b)(1), unless the authorization is terminated  or revoked sooner.       Influenza A by PCR NEGATIVE NEGATIVE Final   Influenza B by PCR NEGATIVE NEGATIVE Final    Comment: (NOTE) The Xpert Xpress SARS-CoV-2/FLU/RSV plus assay is intended as an aid in the diagnosis of influenza from Nasopharyngeal swab specimens and should  not be used as a sole basis for treatment. Nasal washings and aspirates are unacceptable for Xpert Xpress SARS-CoV-2/FLU/RSV testing.  Fact Sheet for  Patients: EntrepreneurPulse.com.au  Fact Sheet for Healthcare Providers: IncredibleEmployment.be  This test is not yet approved or cleared by the [place] FDA and has been authorized for detection and/or diagnosis of SARS-CoV-2 by FDA under an Emergency Use Authorization (EUA). This EUA will remain in effect (meaning this test can be used) for the duration of the COVID-19 declaration under Section 564(b)(1) of the Act, 21 U.S.C. section 360bbb-3(b)(1), unless the authorization is terminated or revoked.  Performed at [facility], [address] [place], [place] [address]   Resp Panel by RT-PCR (Flu A&B, Covid) Nasopharyngeal Swab     Status: None   Collection Time: 09/21/20 10:00 AM   Specimen: Nasopharyngeal Swab; Nasopharyngeal(NP) swabs in vial transport medium  Result Value Ref Range Status   SARS Coronavirus 2 by RT PCR NEGATIVE NEGATIVE Final    Comment: (NOTE) SARS-CoV-2 target nucleic acids are NOT DETECTED.  The SARS-CoV-2 RNA is generally detectable in upper respiratory specimens during the acute phase of infection. The lowest concentration of SARS-CoV-2 viral copies this assay can detect is 138 copies/mL. A negative result does not preclude SARS-Cov-2 infection and should not be used as the sole basis for treatment or other patient management decisions. A negative result may occur with  improper specimen collection/handling, submission of specimen other than nasopharyngeal swab, presence of viral mutation(s) within the areas targeted by this assay, and inadequate number of viral copies(<138 copies/mL). A negative result must be combined with clinical observations, patient history, and epidemiological information. The expected result is Negative.  Fact Sheet for Patients:  EntrepreneurPulse.com.au  Fact Sheet for Healthcare Providers:  IncredibleEmployment.be  This test is no t  yet approved or cleared by the [place] FDA and  has been authorized for detection and/or diagnosis of SARS-CoV-2 by FDA under an Emergency Use Authorization (EUA). This EUA will remain  in effect (meaning this test can be used) for the duration of the COVID-19 declaration under Section 564(b)(1) of the Act, 21 U.S.C.section 360bbb-3(b)(1), unless the authorization is terminated  or revoked sooner.       Influenza A by PCR NEGATIVE NEGATIVE Final   Influenza B by PCR NEGATIVE NEGATIVE Final    Comment: (NOTE) The Xpert Xpress SARS-CoV-2/FLU/RSV plus assay is intended as an aid in the diagnosis of influenza from Nasopharyngeal swab specimens and should not be used as a sole basis for treatment. Nasal washings and aspirates are unacceptable for Xpert Xpress SARS-CoV-2/FLU/RSV testing.  Fact Sheet for Patients: EntrepreneurPulse.com.au  Fact Sheet for Healthcare Providers: IncredibleEmployment.be  This test is not yet approved or cleared by the [place] FDA and has been authorized for detection and/or diagnosis of SARS-CoV-2 by FDA under an Emergency Use Authorization (EUA). This EUA will remain in effect (meaning this test can be used) for the duration of the COVID-19 declaration under Section 564(b)(1) of the Act, 21 U.S.C. section 360bbb-3(b)(1), unless the authorization is terminated or revoked.  Performed at [facility], [address], [place], [place] [address]      Time coordinating discharge: Over 30 minutes  SIGNED:   Phillips Climes, MD  [facility] 09/21/2020, 11:23 AM Pager   If 7PM-7AM, please contact night-coverage [email or web address] Password [email or web address]

## 2020-10-16 ENCOUNTER — Other Ambulatory Visit: Payer: Self-pay

## 2020-10-16 DIAGNOSIS — M797 Fibromyalgia: Secondary | ICD-10-CM | POA: Diagnosis present

## 2020-10-16 DIAGNOSIS — Z6841 Body Mass Index (BMI) 40.0 and over, adult: Secondary | ICD-10-CM

## 2020-10-16 DIAGNOSIS — F419 Anxiety disorder, unspecified: Secondary | ICD-10-CM | POA: Diagnosis present

## 2020-10-16 DIAGNOSIS — M109 Gout, unspecified: Secondary | ICD-10-CM | POA: Diagnosis present

## 2020-10-16 DIAGNOSIS — M154 Erosive (osteo)arthritis: Secondary | ICD-10-CM | POA: Diagnosis present

## 2020-10-16 DIAGNOSIS — M79605 Pain in left leg: Secondary | ICD-10-CM | POA: Diagnosis present

## 2020-10-16 DIAGNOSIS — Z7901 Long term (current) use of anticoagulants: Secondary | ICD-10-CM

## 2020-10-16 DIAGNOSIS — E1142 Type 2 diabetes mellitus with diabetic polyneuropathy: Secondary | ICD-10-CM | POA: Diagnosis present

## 2020-10-16 DIAGNOSIS — Z794 Long term (current) use of insulin: Secondary | ICD-10-CM

## 2020-10-16 DIAGNOSIS — Z888 Allergy status to other drugs, medicaments and biological substances status: Secondary | ICD-10-CM

## 2020-10-16 DIAGNOSIS — K219 Gastro-esophageal reflux disease without esophagitis: Secondary | ICD-10-CM | POA: Diagnosis present

## 2020-10-16 DIAGNOSIS — K5900 Constipation, unspecified: Secondary | ICD-10-CM | POA: Diagnosis present

## 2020-10-16 DIAGNOSIS — S72434A Nondisplaced fracture of medial condyle of right femur, initial encounter for closed fracture: Principal | ICD-10-CM | POA: Diagnosis present

## 2020-10-16 DIAGNOSIS — F32A Depression, unspecified: Secondary | ICD-10-CM | POA: Diagnosis present

## 2020-10-16 DIAGNOSIS — Z96653 Presence of artificial knee joint, bilateral: Secondary | ICD-10-CM | POA: Diagnosis present

## 2020-10-16 DIAGNOSIS — Z79899 Other long term (current) drug therapy: Secondary | ICD-10-CM

## 2020-10-16 DIAGNOSIS — I480 Paroxysmal atrial fibrillation: Secondary | ICD-10-CM | POA: Diagnosis present

## 2020-10-16 DIAGNOSIS — Z880 Allergy status to penicillin: Secondary | ICD-10-CM

## 2020-10-16 DIAGNOSIS — Z8042 Family history of malignant neoplasm of prostate: Secondary | ICD-10-CM

## 2020-10-16 DIAGNOSIS — I10 Essential (primary) hypertension: Secondary | ICD-10-CM | POA: Diagnosis present

## 2020-10-16 DIAGNOSIS — Z20822 Contact with and (suspected) exposure to covid-19: Secondary | ICD-10-CM | POA: Diagnosis present

## 2020-10-16 DIAGNOSIS — Z803 Family history of malignant neoplasm of breast: Secondary | ICD-10-CM

## 2020-10-16 DIAGNOSIS — Z885 Allergy status to narcotic agent status: Secondary | ICD-10-CM

## 2020-10-16 DIAGNOSIS — Z8673 Personal history of transient ischemic attack (TIA), and cerebral infarction without residual deficits: Secondary | ICD-10-CM

## 2020-10-16 DIAGNOSIS — W07XXXA Fall from chair, initial encounter: Secondary | ICD-10-CM | POA: Diagnosis present

## 2020-10-16 DIAGNOSIS — Y92019 Unspecified place in single-family (private) house as the place of occurrence of the external cause: Secondary | ICD-10-CM

## 2020-10-16 DIAGNOSIS — M79604 Pain in right leg: Secondary | ICD-10-CM | POA: Diagnosis present

## 2020-10-16 NOTE — ED Triage Notes (Addendum)
Pt states she was "sitting in that recliner too long" tonight and when she attempted to get up with her walker, she fell landing on bilateral knees. Pt states her legs buckled and were under her.  Pt complains of pain from bilateral knees down to feet. No obvious deformities noted.

## 2020-10-17 ENCOUNTER — Encounter: Payer: Self-pay | Admitting: Family Medicine

## 2020-10-17 ENCOUNTER — Inpatient Hospital Stay
Admission: EM | Admit: 2020-10-17 | Discharge: 2020-10-20 | DRG: 534 | Disposition: A | Discharge status: Skilled Nursing Facility | Payer: Medicare Other | Attending: Internal Medicine | Admitting: Internal Medicine

## 2020-10-17 ENCOUNTER — Inpatient Hospital Stay: Payer: Medicare Other

## 2020-10-17 ENCOUNTER — Emergency Department: Payer: Medicare Other

## 2020-10-17 DIAGNOSIS — W07XXXA Fall from chair, initial encounter: Secondary | ICD-10-CM | POA: Diagnosis present

## 2020-10-17 DIAGNOSIS — E1142 Type 2 diabetes mellitus with diabetic polyneuropathy: Secondary | ICD-10-CM | POA: Diagnosis present

## 2020-10-17 DIAGNOSIS — Z20822 Contact with and (suspected) exposure to covid-19: Secondary | ICD-10-CM | POA: Diagnosis present

## 2020-10-17 DIAGNOSIS — S72414A Nondisplaced unspecified condyle fracture of lower end of right femur, initial encounter for closed fracture: Secondary | ICD-10-CM | POA: Diagnosis not present

## 2020-10-17 DIAGNOSIS — K219 Gastro-esophageal reflux disease without esophagitis: Secondary | ICD-10-CM | POA: Diagnosis present

## 2020-10-17 DIAGNOSIS — Z794 Long term (current) use of insulin: Secondary | ICD-10-CM | POA: Diagnosis not present

## 2020-10-17 DIAGNOSIS — Z7901 Long term (current) use of anticoagulants: Secondary | ICD-10-CM | POA: Diagnosis not present

## 2020-10-17 DIAGNOSIS — M109 Gout, unspecified: Secondary | ICD-10-CM | POA: Diagnosis present

## 2020-10-17 DIAGNOSIS — I10 Essential (primary) hypertension: Secondary | ICD-10-CM | POA: Diagnosis present

## 2020-10-17 DIAGNOSIS — Z8042 Family history of malignant neoplasm of prostate: Secondary | ICD-10-CM | POA: Diagnosis not present

## 2020-10-17 DIAGNOSIS — S72411A Displaced unspecified condyle fracture of lower end of right femur, initial encounter for closed fracture: Secondary | ICD-10-CM | POA: Diagnosis present

## 2020-10-17 DIAGNOSIS — F419 Anxiety disorder, unspecified: Secondary | ICD-10-CM | POA: Diagnosis present

## 2020-10-17 DIAGNOSIS — Z6841 Body Mass Index (BMI) 40.0 and over, adult: Secondary | ICD-10-CM | POA: Diagnosis not present

## 2020-10-17 DIAGNOSIS — Z888 Allergy status to other drugs, medicaments and biological substances status: Secondary | ICD-10-CM | POA: Diagnosis not present

## 2020-10-17 DIAGNOSIS — W19XXXA Unspecified fall, initial encounter: Secondary | ICD-10-CM | POA: Diagnosis not present

## 2020-10-17 DIAGNOSIS — S72434A Nondisplaced fracture of medial condyle of right femur, initial encounter for closed fracture: Principal | ICD-10-CM

## 2020-10-17 DIAGNOSIS — S92512A Displaced fracture of proximal phalanx of left lesser toe(s), initial encounter for closed fracture: Secondary | ICD-10-CM

## 2020-10-17 DIAGNOSIS — Y92019 Unspecified place in single-family (private) house as the place of occurrence of the external cause: Secondary | ICD-10-CM | POA: Diagnosis not present

## 2020-10-17 DIAGNOSIS — Z8673 Personal history of transient ischemic attack (TIA), and cerebral infarction without residual deficits: Secondary | ICD-10-CM | POA: Diagnosis not present

## 2020-10-17 DIAGNOSIS — I480 Paroxysmal atrial fibrillation: Secondary | ICD-10-CM | POA: Diagnosis present

## 2020-10-17 DIAGNOSIS — Z880 Allergy status to penicillin: Secondary | ICD-10-CM | POA: Diagnosis not present

## 2020-10-17 DIAGNOSIS — M797 Fibromyalgia: Secondary | ICD-10-CM | POA: Diagnosis present

## 2020-10-17 DIAGNOSIS — Z885 Allergy status to narcotic agent status: Secondary | ICD-10-CM | POA: Diagnosis not present

## 2020-10-17 DIAGNOSIS — Z79899 Other long term (current) drug therapy: Secondary | ICD-10-CM | POA: Diagnosis not present

## 2020-10-17 DIAGNOSIS — Z96653 Presence of artificial knee joint, bilateral: Secondary | ICD-10-CM | POA: Diagnosis present

## 2020-10-17 DIAGNOSIS — M79604 Pain in right leg: Secondary | ICD-10-CM | POA: Diagnosis present

## 2020-10-17 DIAGNOSIS — Z803 Family history of malignant neoplasm of breast: Secondary | ICD-10-CM | POA: Diagnosis not present

## 2020-10-17 DIAGNOSIS — M79605 Pain in left leg: Secondary | ICD-10-CM | POA: Diagnosis present

## 2020-10-17 DIAGNOSIS — F32A Depression, unspecified: Secondary | ICD-10-CM | POA: Diagnosis present

## 2020-10-17 LAB — COMPREHENSIVE METABOLIC PANEL
ALT: 27 U/L (ref 0–44)
AST: 22 U/L (ref 15–41)
Albumin: 3.2 g/dL — ABNORMAL LOW (ref 3.5–5.0)
Alkaline Phosphatase: 100 U/L (ref 38–126)
Anion gap: 10 (ref 5–15)
BUN: 15 mg/dL (ref 8–23)
CO2: 28 mmol/L (ref 22–32)
Calcium: 8.5 mg/dL — ABNORMAL LOW (ref 8.9–10.3)
Chloride: 102 mmol/L (ref 98–111)
Creatinine, Ser: 0.87 mg/dL (ref 0.44–1.00)
GFR, Estimated: >60 mL/min (ref >60)
Glucose, Bld: 209 mg/dL — ABNORMAL HIGH (ref 70–99)
Potassium: 3.6 mmol/L (ref 3.5–5.1)
Sodium: 140 mmol/L (ref 135–145)
Total Bilirubin: 1 mg/dL (ref 0.3–1.2)
Total Protein: 6.4 g/dL — ABNORMAL LOW (ref 6.5–8.1)

## 2020-10-17 LAB — CBC WITH DIFFERENTIAL/PLATELET
Abs Immature Granulocytes: 0.02 10*3/uL (ref 0.00–0.07)
Basophils Absolute: 0 10*3/uL (ref 0.0–0.1)
Basophils Relative: 0 %
Eosinophils Absolute: 0.3 10*3/uL (ref 0.0–0.5)
Eosinophils Relative: 3 %
HCT: 42.4 % (ref 36.0–46.0)
Hemoglobin: 14.1 g/dL (ref 12.0–15.0)
Immature Granulocytes: 0 %
Lymphocytes Relative: 44 %
Lymphs Abs: 3.9 10*3/uL (ref 0.7–4.0)
MCH: 30.3 pg (ref 26.0–34.0)
MCHC: 33.3 g/dL (ref 30.0–36.0)
MCV: 91.2 fL (ref 80.0–100.0)
Monocytes Absolute: 0.7 10*3/uL (ref 0.1–1.0)
Monocytes Relative: 8 %
Neutro Abs: 4 10*3/uL (ref 1.7–7.7)
Neutrophils Relative %: 45 %
Platelets: 142 10*3/uL — ABNORMAL LOW (ref 150–400)
RBC: 4.65 MIL/uL (ref 3.87–5.11)
RDW: 14.4 % (ref 11.5–15.5)
WBC: 9 10*3/uL (ref 4.0–10.5)
nRBC: 0 % (ref 0.0–0.2)

## 2020-10-17 LAB — RESP PANEL BY RT-PCR (FLU A&B, COVID) ARPGX2
Influenza A by PCR: NEGATIVE
Influenza B by PCR: NEGATIVE
SARS Coronavirus 2 by RT PCR: NEGATIVE

## 2020-10-17 LAB — GLUCOSE, CAPILLARY
Glucose-Capillary: 269 mg/dL — ABNORMAL HIGH (ref 70–99)
Glucose-Capillary: 191 mg/dL — ABNORMAL HIGH (ref 70–99)

## 2020-10-17 LAB — TROPONIN I (HIGH SENSITIVITY): Troponin I (High Sensitivity): 15 ng/L (ref <18)

## 2020-10-17 LAB — TYPE AND SCREEN
ABO/RH(D): B POS
Antibody Screen: NEGATIVE

## 2020-10-17 MED ORDER — PREGABALIN 50 MG PO CAPS
50.0000 mg | ORAL_CAPSULE | Freq: Two times a day (BID) | ORAL | Status: DC
  Administered 2020-10-17 – 2020-10-20 (×7): 50 mg via ORAL
  Filled 2020-10-17 (×7): qty 1

## 2020-10-17 MED ORDER — DULOXETINE HCL 60 MG PO CPEP
60.0000 mg | ORAL_CAPSULE | Freq: Every day | ORAL | Status: DC
  Administered 2020-10-17 – 2020-10-20 (×4): 60 mg via ORAL
  Filled 2020-10-17 (×4): qty 1

## 2020-10-17 MED ORDER — FAMOTIDINE 20 MG PO TABS
20.0000 mg | ORAL_TABLET | Freq: Every day | ORAL | Status: DC
  Administered 2020-10-17 – 2020-10-20 (×4): 20 mg via ORAL
  Filled 2020-10-17 (×4): qty 1

## 2020-10-17 MED ORDER — MORPHINE SULFATE (PF) 2 MG/ML IV SOLN
2.0000 mg | INTRAVENOUS | Status: DC | PRN
  Filled 2020-10-17: qty 1

## 2020-10-17 MED ORDER — BISMUTH SUBSALICYLATE 262 MG/15ML PO SUSP: 30.0000 mL | Freq: Every day | ORAL | Status: DC

## 2020-10-17 MED ORDER — SENNOSIDES-DOCUSATE SODIUM 8.6-50 MG PO TABS
2.0000 | ORAL_TABLET | Freq: Two times a day (BID) | ORAL | Status: DC
  Administered 2020-10-17 – 2020-10-19 (×6): 2 via ORAL
  Filled 2020-10-17 (×7): qty 2

## 2020-10-17 MED ORDER — ATORVASTATIN CALCIUM 20 MG PO TABS
20.0000 mg | ORAL_TABLET | Freq: Every day | ORAL | Status: DC
  Administered 2020-10-17 – 2020-10-20 (×4): 20 mg via ORAL
  Filled 2020-10-17 (×4): qty 1

## 2020-10-17 MED ORDER — SODIUM CHLORIDE 0.9 % IV SOLN: INTRAVENOUS | Status: DC

## 2020-10-17 MED ORDER — ACETAMINOPHEN 325 MG PO TABS: 650.0000 mg | ORAL_TABLET | Freq: Four times a day (QID) | ORAL | Status: DC | PRN

## 2020-10-17 MED ORDER — APIXABAN 5 MG PO TABS
5.0000 mg | ORAL_TABLET | Freq: Two times a day (BID) | ORAL | Status: DC
  Administered 2020-10-17 – 2020-10-20 (×7): 5 mg via ORAL
  Filled 2020-10-17 (×4): qty 1
  Filled 2020-10-17: qty 2
  Filled 2020-10-17 (×2): qty 1

## 2020-10-17 MED ORDER — LEVETIRACETAM 500 MG PO TABS
1000.0000 mg | ORAL_TABLET | Freq: Two times a day (BID) | ORAL | Status: DC
  Administered 2020-10-17 – 2020-10-20 (×7): 1000 mg via ORAL
  Filled 2020-10-17 (×8): qty 2

## 2020-10-17 MED ORDER — MORPHINE SULFATE (PF) 2 MG/ML IV SOLN
2.0000 mg | INTRAVENOUS | Status: DC | PRN
Start: 2020-10-17 — End: 2020-10-20
  Administered 2020-10-17 – 2020-10-18 (×2): 2 mg via INTRAVENOUS
  Filled 2020-10-17 (×2): qty 1

## 2020-10-17 MED ORDER — COLCHICINE 0.6 MG PO TABS: 0.6000 mg | ORAL_TABLET | Freq: Every day | ORAL | Status: DC | PRN

## 2020-10-17 MED ORDER — MONTELUKAST SODIUM 10 MG PO TABS
10.0000 mg | ORAL_TABLET | Freq: Every day | ORAL | Status: DC
  Administered 2020-10-17 – 2020-10-19 (×3): 10 mg via ORAL
  Filled 2020-10-17 (×3): qty 1

## 2020-10-17 MED ORDER — ONDANSETRON HCL 4 MG/2ML IJ SOLN
4.0000 mg | Freq: Four times a day (QID) | INTRAMUSCULAR | Status: DC | PRN
Start: 2020-10-17 — End: 2020-10-20

## 2020-10-17 MED ORDER — INSULIN ASPART 100 UNIT/ML IJ SOLN
0.0000 [IU] | Freq: Three times a day (TID) | INTRAMUSCULAR | Status: DC
  Administered 2020-10-17: 5 [IU] via SUBCUTANEOUS
  Administered 2020-10-18 (×2): 3 [IU] via SUBCUTANEOUS
  Administered 2020-10-18 – 2020-10-19 (×2): 5 [IU] via SUBCUTANEOUS
  Administered 2020-10-19: 9 [IU] via SUBCUTANEOUS
  Filled 2020-10-17 (×6): qty 1

## 2020-10-17 MED ORDER — LISINOPRIL 5 MG PO TABS: 5.0000 mg | ORAL_TABLET | Freq: Every day | ORAL | Status: DC

## 2020-10-17 MED ORDER — INSULIN ASPART 100 UNIT/ML IJ SOLN
0.0000 [IU] | Freq: Every day | INTRAMUSCULAR | Status: DC
  Administered 2020-10-18: 3 [IU] via SUBCUTANEOUS
  Filled 2020-10-17: qty 1

## 2020-10-17 MED ORDER — ACETAMINOPHEN 650 MG RE SUPP: 650.0000 mg | Freq: Four times a day (QID) | RECTAL | Status: DC | PRN

## 2020-10-17 MED ORDER — FUROSEMIDE 40 MG PO TABS: 20.0000 mg | ORAL_TABLET | Freq: Every day | ORAL | Status: DC

## 2020-10-17 MED ORDER — TRAZODONE HCL 50 MG PO TABS: 25.0000 mg | ORAL_TABLET | Freq: Every evening | ORAL | Status: DC | PRN

## 2020-10-17 MED ORDER — MAGNESIUM HYDROXIDE 400 MG/5ML PO SUSP: 30.0000 mL | Freq: Every day | ORAL | Status: DC | PRN

## 2020-10-17 MED ORDER — OXYCODONE-ACETAMINOPHEN 5-325 MG PO TABS
1.0000 | ORAL_TABLET | Freq: Four times a day (QID) | ORAL | Status: DC | PRN
  Administered 2020-10-17 – 2020-10-20 (×9): 1 via ORAL
  Filled 2020-10-17 (×10): qty 1

## 2020-10-17 MED ORDER — APIXABAN 5 MG PO TABS: 5.0000 mg | ORAL_TABLET | Freq: Two times a day (BID) | ORAL | Status: DC

## 2020-10-17 MED ORDER — POLYETHYLENE GLYCOL 3350 17 G PO PACK: 34.0000 g | PACK | Freq: Every day | ORAL | Status: DC | PRN

## 2020-10-17 MED ORDER — ALBUTEROL SULFATE (2.5 MG/3ML) 0.083% IN NEBU: 3.0000 mL | INHALATION_SOLUTION | RESPIRATORY_TRACT | Status: DC | PRN

## 2020-10-17 MED ORDER — ONDANSETRON HCL 4 MG PO TABS: 4.0000 mg | ORAL_TABLET | Freq: Four times a day (QID) | ORAL | Status: DC | PRN

## 2020-10-17 MED ORDER — ZOLPIDEM TARTRATE 5 MG PO TABS
5.0000 mg | ORAL_TABLET | Freq: Every evening | ORAL | Status: DC | PRN
  Administered 2020-10-17 – 2020-10-19 (×3): 5 mg via ORAL
  Filled 2020-10-17 (×3): qty 1

## 2020-10-17 NOTE — H&P (Deleted)
[place]   PATIENT NAME: Traci Carter    MR#:  [id number]  DATE OF BIRTH:  March 27, 1946  DATE OF ADMISSION:  10/17/2020  PRIMARY CARE PHYSICIAN: Albina Billet, MD   Patient is coming from: Home  REQUESTING/REFERRING PHYSICIAN: Valora Piccolo, MD  CHIEF COMPLAINT:   Chief Complaint  Patient presents with   Leg Pain    HISTORY OF PRESENT ILLNESS:  Traci Carter is a 75 y.o. Caucasian female with medical history significant for type 2 diabetes mellitus, hypertension, CVA, atrial fibrillation, presented to the emergency room with acute onset of bilateral lower extremity pain described as aching and throbbing in graded 8/10 in severity.  Started before coming to the ER and encouraged when she tries to get out of her recliner.  While doing that she stated that her knees buckled underneath her and she fell straight down on both knees.  Later on any walking or movement of her knee or ankle worsened her lower extremity pain.  Pain improved with rest.  No presyncope or syncope.  No paresthesias or focal muscle weakness.  She denies any head injury with her mechanical fall.  No chest pain or dyspnea or palpitations.  No cough or wheezing or dyspnea.  No dysuria, oliguria or hematuria or flank pain.  ED Course: When she came to the ER vital signs were within normal.  Labs revealed EKG as reviewed by me : EKG is currently pending.  Imaging: Right knee x-ray showed suspected medial femoral condyle fracture and right knee arthropathy.  Left knee showed left knee arthropathy without evidence of complications. Right and left tib-fib x-ray came back negative.  Right and left ankle x-ray came back negative. Left foot x-ray showed subtle lucency involving the proximal shaft of the fifth proximal phalanx.  Right foot x-ray showed erosive arthropathy involving the first MTP joint which could be related to gout with no fracture or dislocation.  Dr. Sharlet Salina was notified about the patient.  Patient was  placed in knee immobilizer.  She will be admitted to a med-surgical bed for further evaluation and management. 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   COLONOSCOPY WITH PROPOFOL N/A 06/28/2017   Procedure: COLONOSCOPY WITH PROPOFOL;  Surgeon: Robert Bellow, MD;  Location: [facility] ENDOSCOPY;  Service: Endoscopy;  Laterality: N/A;   ELBOW SURGERY     INSERT / REPLACE / REMOVE PACEMAKER     Dr Saralyn Pilar   JOINT REPLACEMENT  2005   knee   SHOULDER ARTHROSCOPY      SOCIAL HISTORY:   Social History   Tobacco Use   Smoking status: Never   Smokeless tobacco: Never  Substance Use Topics   Alcohol use: Not Currently    Alcohol/week: 1.0 standard drink    Types: 1 Cans of beer per week    Comment: rare    FAMILY HISTORY:   Family History  Problem Relation Age of Onset   Breast cancer Mother    Prostate cancer Father     DRUG ALLERGIES:  Allergies  Allergen Reactions   Codeine Other (See Comments)    GI Distress   Lanoxin [Digoxin]    Zoloft [Sertraline Hcl]    Penicillins Rash    REVIEW OF SYSTEMS:   ROS As per history of present illness. All pertinent systems were reviewed above. Constitutional, HEENT, cardiovascular, respiratory, GI, GU, musculoskeletal, neuro, psychiatric, endocrine, integumentary and hematologic systems were reviewed and are otherwise negative/unremarkable except for positive findings mentioned above in the HPI.   MEDICATIONS AT HOME:   Prior to Admission medications   Medication Sig Start Date End Date Taking? Authorizing Provider  acetaminophen  (TYLENOL) 325 MG tablet Take 2 tablets (650 mg total) by mouth every 6 (six) hours as needed for mild pain, headache, fever or moderate pain. 09/21/20   Elgergawy, Silver Huguenin, MD  albuterol (PROVENTIL) (2.5 MG/3ML) 0.083% nebulizer solution Inhale 3 mLs into the lungs every 4 (four) hours as needed for wheezing or shortness of breath. 09/21/20   Elgergawy, Silver Huguenin, MD  apixaban (ELIQUIS) 5 MG TABS tablet Take 1 tablet (5 mg total) by mouth 2 (two) times daily. 07/01/17   Robert Bellow, MD  atorvastatin (LIPITOR) 20 MG tablet Take 20 mg by mouth daily.    [provider]  bismuth subsalicylate (PEPTO BISMOL) 262 MG/15ML suspension Take 30 mLs by mouth daily.    [provider]  colchicine 0.6 MG tablet Take 1 tablet (0.6 mg total) by mouth daily as needed (gout). 09/21/20   Elgergawy, Silver Huguenin, MD  DULoxetine (CYMBALTA) 60 MG capsule Take 60 mg by mouth daily. 09/08/20   [provider]  famotidine (PEPCID) 20 MG tablet Take 20 mg by mouth daily. 07/27/19   [provider]  furosemide (LASIX) 20 MG tablet Take 1 tablet (20 mg total) by mouth daily. 10/18/12   Philmore Pali, NP  insulin aspart (NOVOLOG) 100 UNIT/ML injection Inject 0-9 Units into the skin 3 (three) times daily with meals. 09/21/20   Elgergawy, Silver Huguenin, MD  levETIRAcetam (KEPPRA) 1000 MG tablet Take 1 tablet (1,000 mg total) by mouth 2 (two) times daily. 10/18/12   Philmore Pali, NP  lisinopril (PRINIVIL,ZESTRIL) 5 MG tablet Take 1 tablet (5 mg total) by mouth daily. 10/18/12   Philmore Pali, NP  montelukast (SINGULAIR) 10 MG tablet  11/07/13   [provider]  polyethylene glycol (MIRALAX / GLYCOLAX) 17 g packet Take 34 g by mouth daily as needed. 09/21/20   Elgergawy, Silver Huguenin, MD  pregabalin (LYRICA) 50 MG capsule Take 1 capsule (50 mg total) by mouth 2 (two) times daily. 09/21/20 09/21/21  Elgergawy, Silver Huguenin, MD  senna-docusate (SENOKOT-S) 8.6-50 MG tablet Take 2 tablets by mouth 2 (two) times daily.  09/21/20   Elgergawy, Silver Huguenin, MD  zolpidem (AMBIEN) 5 MG tablet Take 1 tablet (5 mg total) by mouth at bedtime as needed for sleep. 09/21/20   Elgergawy, Silver Huguenin, MD      VITAL SIGNS:  Blood pressure 122/65, pulse 90, temperature 98.2 F (36.8 C), temperature source Oral, resp. rate 16, height 5\' 3"  (1.6 m), weight 110.7 kg, SpO2 95 %.  PHYSICAL EXAMINATION:  Physical Exam  GENERAL:  75 y.o.-year-old Caucasian female patient lying in the bed with no acute distress.  EYES: Pupils equal, round, reactive to light and accommodation. No scleral icterus. Extraocular muscles intact.  HEENT: Head atraumatic, normocephalic. Oropharynx and nasopharynx clear.  NECK:  Supple, no jugular venous distention. No thyroid enlargement, no tenderness.  LUNGS: Normal  breath sounds bilaterally, no wheezing, rales,rhonchi or crepitation. No use of accessory muscles of respiration.  CARDIOVASCULAR: Regular rate and rhythm, S1, S2 normal. No murmurs, rubs, or gallops.  ABDOMEN: Soft, nondistended, nontender. Bowel sounds present. No organomegaly or mass.  EXTREMITIES: No pedal edema, cyanosis, or clubbing.  NEUROLOGIC: Cranial nerves II through XII are intact. Muscle strength 5/5 in all extremities. Sensation intact. Gait not checked. Musculoskeletal: Right knee is in immobilizer. PSYCHIATRIC: The patient is alert and oriented x 3.  Normal affect and good eye contact. SKIN: No obvious rash, lesion, or ulcer.   LABORATORY PANEL:   CBC Recent Labs  Lab 10/17/20 0534  WBC 9.0  HGB 14.1  HCT 42.4  PLT 142*   ------------------------------------------------------------------------------------------------------------------  Chemistries  Recent Labs  Lab 10/17/20 0534  NA 140  K 3.6  CL 102  CO2 28  GLUCOSE 209*  BUN 15  CREATININE 0.87  CALCIUM 8.5*  AST 22  ALT 27  ALKPHOS 100  BILITOT 1.0    ------------------------------------------------------------------------------------------------------------------  Cardiac Enzymes No results for input(s): TROPONINI in the last 168 hours. ------------------------------------------------------------------------------------------------------------------  RADIOLOGY:  DG Tibia/Fibula Left  Result Date: 10/17/2020 CLINICAL DATA:  Fall, bilateral leg pain EXAM: LEFT TIBIA AND FIBULA - 2 VIEW COMPARISON:  None. FINDINGS: No fracture or dislocation is seen. The joint spaces are preserved. The visualized soft tissues are unremarkable. IMPRESSION: Negative. Electronically Signed   By: Julian Hy M.D.   On: 10/17/2020 03:33   DG Tibia/Fibula Right  Result Date: 10/17/2020 CLINICAL DATA:  Fall, bilateral leg pain EXAM: RIGHT TIBIA AND FIBULA - 2 VIEW COMPARISON:  None. FINDINGS: No fracture or dislocation is seen. The joint spaces are preserved. Visualized soft tissues are within normal limits. IMPRESSION: Negative. Electronically Signed   By: Julian Hy M.D.   On: 10/17/2020 03:34   DG Ankle Complete Left  Result Date: 10/17/2020 CLINICAL DATA:  Fall, bilateral leg pain EXAM: LEFT ANKLE COMPLETE - 3+ VIEW COMPARISON:  None. FINDINGS: No fracture or dislocation is seen. The ankle mortise is intact. Small plantar calcaneal enthesophyte. Visualized soft tissues are within normal limits. IMPRESSION: Negative. Electronically Signed   By: Julian Hy M.D.   On: 10/17/2020 03:34   DG Ankle Complete Right  Result Date: 10/17/2020 CLINICAL DATA:  Fall, bilateral leg pain EXAM: RIGHT ANKLE - COMPLETE 3+ VIEW COMPARISON:  None. FINDINGS: No fracture or dislocation is seen. The ankle mortise is intact. Small plantar calcaneal these fight. Visualized soft tissues are within normal limits. IMPRESSION: Negative. Electronically Signed   By: Julian Hy M.D.   On: 10/17/2020 03:35   DG Knee Complete 4 Views Left  Result Date:  10/17/2020 CLINICAL DATA:  Fall, bilateral leg pain EXAM: LEFT KNEE - COMPLETE 4+ VIEW COMPARISON:  None. FINDINGS: Left knee arthroplasty, without evidence of complication. No fracture or dislocation is seen. Visualized soft tissues are within normal limits. No suprapatellar knee joint effusion. IMPRESSION: Left knee arthroplasty, without evidence of complication. No fracture or dislocation is seen. Electronically Signed   By: Julian Hy M.D.   On: 10/17/2020 03:30   DG Knee Complete 4 Views Right  Result Date: 10/17/2020 CLINICAL DATA:  Fall, bilateral leg pain EXAM: RIGHT KNEE - COMPLETE 4+ VIEW COMPARISON:  None. FINDINGS: Right knee arthroplasty. Suspected minimally displaced fracture along the medial femoral condyle, only visualized on a single view. Visualized soft tissues are within normal limits. No suprapatellar knee joint effusion. IMPRESSION: Suspected medial femoral condyle fracture. Right knee arthroplasty. Electronically  Signed   By: Julian Hy M.D.   On: 10/17/2020 03:33   DG Foot Complete Left  Result Date: 10/17/2020 CLINICAL DATA:  Fall, bilateral leg pain EXAM: LEFT FOOT - COMPLETE 3+ VIEW COMPARISON:  None. FINDINGS: Subtle lucency involving the proximal shaft of the 5th proximal phalanx. Correlate for point tenderness. Otherwise, no fracture is seen. Degenerative changes the 1st MTP joint. Suspected marginal erosion along the distal aspect of the this metatarsal. Small plantar calcaneal enthesophyte. The visualized soft tissues are unremarkable. IMPRESSION: Subtle lucency involving the proximal shaft of the 5th proximal phalanx. Correlate for point tenderness to exclude nondisplaced transverse fracture. Electronically Signed   By: Julian Hy M.D.   On: 10/17/2020 03:37   DG Foot Complete Right  Result Date: 10/17/2020 CLINICAL DATA:  Fall, bilateral leg pain EXAM: RIGHT FOOT COMPLETE - 3+ VIEW COMPARISON:  None. FINDINGS: No fracture or dislocation is seen.  Degenerative changes of the 1st MTP joint with marginal erosions, raising the possibility of gout. Small plantar calcaneal enthesophyte. IMPRESSION: No fracture or dislocation is seen. 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 Electronically Signed   By: Julian Hy M.D.   On: 10/17/2020 03:39      IMPRESSION AND PLAN:  Active Problems:   Fracture of femoral condyle, right, closed ([place])  1.  Closed fracture of the right femoral medial condyle.  Patient did show will be admitted to a medical-surgical bed. - Pain management will be provided. - We will be placing immobilizer. - Orthopedic consultation will be obtained. - Dr. Sharlet Salina was notified and is aware about the patient. - We will keep the patient n.p.o. for now. - We will hold off Eliquis for now. - Right knee CT is currently pending.  2.  Type 2 diabetes mellitus with peripheral neuropathy. - The patient will be placed on supplemental coverage with NovoLog and will continue Lantus at half home dose when he is NPO. - We will hold off metformin. - We will continue Lyrica.  4.  Essential hypertension. - We will continue lisinopril.  Her Lasix is continued as well - We will need to follow her creatinine.  Both Lasix and lisinopril can be held off if patient has elevated creatinine.  5.  Gout. - We will continue colchicine and allopurinol.  6.  Paroxysmal atrial fibrillation. - We will hold off he is Eliquis.  7.  GERD. - We will continue H2 blocker therapy.  8.  History of intracranial hemorrhage. - We will continue Keppra.  9.  Fibromyalgia and depression with anxiety. - We will continue Cymbalta   DVT prophylaxis: SCDs.  Medical prophylaxis is held off pending decision operative procedure. Code Status: full code. Family Communication:  The plan of care was discussed in details with the patient (and family). I answered all questions. The patient agreed to proceed with the above  mentioned plan. Further management will depend upon hospital course. Disposition Plan: Back to previous home environment Consults called: Orthopedic consult. All the records are reviewed and case discussed with ED provider.  Status is: Inpatient  Remains inpatient appropriate because:Ongoing active pain requiring inpatient pain management, Ongoing diagnostic testing needed not appropriate for outpatient work up, Unsafe d/c plan, IV treatments appropriate due to intensity of illness or inability to take PO, and Inpatient level of care appropriate due to severity of illness  Dispo: The patient is from: Home              Anticipated d/c  is to: Home              Patient currently is not medically stable to d/c.   Difficult to place patient No  TOTAL TIME TAKING CARE OF THIS PATIENT: 55 minutes.    Christel Mormon M.D on 10/17/2020 at 6:43 AM  [facility]   From 7 PM-7 AM, contact night-coverage [email or web address]  CC: Primary care physician; Albina Billet, MD

## 2020-10-17 NOTE — Progress Notes (Signed)
[place] OF CARE NOTE Patient: Traci Carter SHF:[id number]   PCP: Albina Billet, MD DOB: [birth year]   DOA: 10/17/2020   DOS: 10/17/2020    Patient was admitted by my colleague earlier on 10/17/2020. I have reviewed the H&P as well as assessment and plan and agree with the same. Important changes in the plan are listed below.  Plan of care: Active Problems:   Fracture of femoral condyle, right, closed [facility]) Per Orthopedics conservative measure. No surgery and WBAT.   Author: Berle Mull, MD [facility] 10/17/2020 4:04 PM   If 7PM-7AM, please contact night-coverage at [email or web address]

## 2020-10-17 NOTE — H&P (Signed)
[place]   PATIENT NAME: Traci Carter    MR#:  [id number]  DATE OF BIRTH:  1945/08/18  DATE OF ADMISSION:  10/17/2020  PRIMARY CARE PHYSICIAN: Albina Billet, MD   Patient is coming from: Home  REQUESTING/REFERRING PHYSICIAN: Valora Piccolo, MD  CHIEF COMPLAINT:   Chief Complaint  Patient presents with  . Leg Pain    HISTORY OF PRESENT ILLNESS:  DELMAR DONDERO is a 75 y.o. female with medical history significant for  type 2 diabetes mellitus, hypertension, CVA, atrial fibrillation, presented to the emergency room with acute onset of bilateral lower extremity pain described as aching and throbbing in graded 8/10 in severity.  Started before coming to the ER and encouraged when she tries to get out of her recliner.  While doing that she stated that her knees buckled underneath her and she fell straight down on both knees.  Later on any walking or movement of her knee or ankle worsened her lower extremity pain.  Pain improved with rest.  No presyncope or syncope.  No paresthesias or focal muscle weakness.  She denies any head injury with her mechanical fall.  No chest pain or dyspnea or palpitations.  No cough or wheezing or dyspnea.  No dysuria, oliguria or hematuria or flank pain.  ED Course: When she came to the ER vital signs were within normal.  Labs revealed blood glucose of 209 and potassium of 3.6.  CBC was within normal except for platelets of 142.  Respiratory panel is currently pending. EKG as reviewed by me : EKG is currently pending.   Imaging: Right knee x-ray showed suspected medial femoral condyle fracture and right knee arthropathy.  Left knee showed left knee arthropathy without evidence of complications. Right and left tib-fib x-ray came back negative.  Right and left ankle x-ray came back negative. Left foot x-ray showed subtle lucency involving the proximal shaft of the fifth proximal phalanx.  Right foot x-ray showed erosive arthropathy involving the first MTP  joint which could be related to gout with no fracture or dislocation.  Dr. Sharlet Salina was notified about the patient.  Patient was placed in knee immobilizer.  She will be admitted to a med-surgical bed for further evaluation and management.   PAST MEDICAL HISTORY:   Past Medical History:  Diagnosis Date  . Afib ([place])   . Diabetes mellitus without complication ([place])   . Dysrhythmia   . Hypertension   . 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 ABDOMINAL HYSTERECTOMY    . CARPAL TUNNEL RELEASE    . COLONOSCOPY  01/02/2009   Dr Bary Castilla diverticulosis.  . COLONOSCOPY WITH PROPOFOL N/A 06/28/2017   Procedure: COLONOSCOPY WITH PROPOFOL;  Surgeon: Robert Bellow, MD;  Location: [facility] ENDOSCOPY;  Service: Endoscopy;  Laterality: N/A;  . ELBOW SURGERY    . INSERT / REPLACE / REMOVE PACEMAKER     Dr Saralyn Pilar  . JOINT REPLACEMENT  2005   knee  . SHOULDER ARTHROSCOPY      SOCIAL HISTORY:   Social History   Tobacco Use  . Smoking status: Never  . Smokeless tobacco: Never  Substance Use Topics  . Alcohol use: Not Currently    Alcohol/week: 1.0 standard drink    Types: 1 Cans of beer per week    Comment: rare    FAMILY HISTORY:  Family History  Problem Relation Age of Onset  . Breast cancer Mother   . Prostate cancer Father     DRUG ALLERGIES:   Allergies  Allergen Reactions  . Codeine Other (See Comments)    GI Distress  . Lanoxin [Digoxin]   . Zoloft [Sertraline Hcl]   . Penicillins Rash    REVIEW OF SYSTEMS:   ROS As per history of present illness. All pertinent systems were reviewed above. Constitutional, HEENT, cardiovascular, respiratory, GI, GU, musculoskeletal, neuro, psychiatric, endocrine, integumentary and hematologic systems were reviewed and are otherwise negative/unremarkable except for positive findings mentioned above in  the HPI.   MEDICATIONS AT HOME:   Prior to Admission medications   Medication Sig Start Date End Date Taking? Authorizing Provider  acetaminophen (TYLENOL) 325 MG tablet Take 2 tablets (650 mg total) by mouth every 6 (six) hours as needed for mild pain, headache, fever or moderate pain. 09/21/20   Elgergawy, Silver Huguenin, MD  albuterol (PROVENTIL) (2.5 MG/3ML) 0.083% nebulizer solution Inhale 3 mLs into the lungs every 4 (four) hours as needed for wheezing or shortness of breath. 09/21/20   Elgergawy, Silver Huguenin, MD  apixaban (ELIQUIS) 5 MG TABS tablet Take 1 tablet (5 mg total) by mouth 2 (two) times daily. 07/01/17   Robert Bellow, MD  atorvastatin (LIPITOR) 20 MG tablet Take 20 mg by mouth daily.    [provider]  bismuth subsalicylate (PEPTO BISMOL) 262 MG/15ML suspension Take 30 mLs by mouth daily.    [provider]  colchicine 0.6 MG tablet Take 1 tablet (0.6 mg total) by mouth daily as needed (gout). 09/21/20   Elgergawy, Silver Huguenin, MD  DULoxetine (CYMBALTA) 60 MG capsule Take 60 mg by mouth daily. 09/08/20   [provider]  famotidine (PEPCID) 20 MG tablet Take 20 mg by mouth daily. 07/27/19   [provider]  furosemide (LASIX) 20 MG tablet Take 1 tablet (20 mg total) by mouth daily. 10/18/12   Philmore Pali, NP  insulin aspart (NOVOLOG) 100 UNIT/ML injection Inject 0-9 Units into the skin 3 (three) times daily with meals. 09/21/20   Elgergawy, Silver Huguenin, MD  levETIRAcetam (KEPPRA) 1000 MG tablet Take 1 tablet (1,000 mg total) by mouth 2 (two) times daily. 10/18/12   Philmore Pali, NP  lisinopril (PRINIVIL,ZESTRIL) 5 MG tablet Take 1 tablet (5 mg total) by mouth daily. 10/18/12   Philmore Pali, NP  montelukast (SINGULAIR) 10 MG tablet  11/07/13   [provider]  polyethylene glycol (MIRALAX / GLYCOLAX) 17 g packet Take 34 g by mouth daily as needed. 09/21/20   Elgergawy, Silver Huguenin, MD  pregabalin (LYRICA) 50 MG capsule Take 1 capsule (50 mg total) by mouth 2  (two) times daily. 09/21/20 09/21/21  Elgergawy, Silver Huguenin, MD  senna-docusate (SENOKOT-S) 8.6-50 MG tablet Take 2 tablets by mouth 2 (two) times daily. 09/21/20   Elgergawy, Silver Huguenin, MD  zolpidem (AMBIEN) 5 MG tablet Take 1 tablet (5 mg total) by mouth at bedtime as needed for sleep. 09/21/20   Elgergawy, Silver Huguenin, MD      VITAL SIGNS:  Blood pressure 122/65, pulse 90, temperature 98.2 F (36.8 C), temperature source Oral, resp. rate 16, height 5\' 3"  (1.6 m), weight 110.7 kg, SpO2 95 %.  PHYSICAL EXAMINATION:  Physical Exam  GENERAL:  75 y.o.-year-old patient lying in the bed with no acute distress.  EYES: Pupils equal, round, reactive to light and accommodation. No scleral icterus. Extraocular muscles intact.  HEENT: Head atraumatic, normocephalic. Oropharynx and nasopharynx clear.  NECK:  Supple, no jugular venous distention. No thyroid enlargement, no tenderness.  LUNGS: Normal breath sounds bilaterally, no wheezing, rales,rhonchi or crepitation. No use of accessory muscles of respiration.  CARDIOVASCULAR: Regular rate and rhythm, S1, S2 normal. No murmurs, rubs, or gallops.  ABDOMEN: Soft, nondistended, nontender. Bowel sounds present. No organomegaly or mass.  EXTREMITIES: No pedal edema, cyanosis, or clubbing.  NEUROLOGIC: Cranial nerves II through XII are intact. Muscle strength 5/5 in all extremities. Sensation intact. Gait not checked.  Musculoskeletal: Right knee is tender with diminished range of motion secondary to pain. PSYCHIATRIC: The patient is alert and oriented x 3.  Normal affect and good eye contact. SKIN: No obvious rash, lesion, or ulcer.   LABORATORY PANEL:   CBC Recent Labs  Lab 10/17/20 0534  WBC 9.0  HGB 14.1  HCT 42.4  PLT 142*   ------------------------------------------------------------------------------------------------------------------  Chemistries  Recent Labs  Lab 10/17/20 0534  NA 140  K 3.6  CL 102  CO2 28  GLUCOSE 209*  BUN 15   CREATININE 0.87  CALCIUM 8.5*  AST 22  ALT 27  ALKPHOS 100  BILITOT 1.0   ------------------------------------------------------------------------------------------------------------------  Cardiac Enzymes No results for input(s): TROPONINI in the last 168 hours. ------------------------------------------------------------------------------------------------------------------  RADIOLOGY:  DG Tibia/Fibula Left  Result Date: 10/17/2020 CLINICAL DATA:  Fall, bilateral leg pain EXAM: LEFT TIBIA AND FIBULA - 2 VIEW COMPARISON:  None. FINDINGS: No fracture or dislocation is seen. The joint spaces are preserved. The visualized soft tissues are unremarkable. IMPRESSION: Negative. Electronically Signed   By: Julian Hy M.D.   On: 10/17/2020 03:33   DG Tibia/Fibula Right  Result Date: 10/17/2020 CLINICAL DATA:  Fall, bilateral leg pain EXAM: RIGHT TIBIA AND FIBULA - 2 VIEW COMPARISON:  None. FINDINGS: No fracture or dislocation is seen. The joint spaces are preserved. Visualized soft tissues are within normal limits. IMPRESSION: Negative. Electronically Signed   By: Julian Hy M.D.   On: 10/17/2020 03:34   DG Ankle Complete Left  Result Date: 10/17/2020 CLINICAL DATA:  Fall, bilateral leg pain EXAM: LEFT ANKLE COMPLETE - 3+ VIEW COMPARISON:  None. FINDINGS: No fracture or dislocation is seen. The ankle mortise is intact. Small plantar calcaneal enthesophyte. Visualized soft tissues are within normal limits. IMPRESSION: Negative. Electronically Signed   By: Julian Hy M.D.   On: 10/17/2020 03:34   DG Ankle Complete Right  Result Date: 10/17/2020 CLINICAL DATA:  Fall, bilateral leg pain EXAM: RIGHT ANKLE - COMPLETE 3+ VIEW COMPARISON:  None. FINDINGS: No fracture or dislocation is seen. The ankle mortise is intact. Small plantar calcaneal these fight. Visualized soft tissues are within normal limits. IMPRESSION: Negative. Electronically Signed   By: Julian Hy M.D.   On:  10/17/2020 03:35   DG Knee Complete 4 Views Left  Result Date: 10/17/2020 CLINICAL DATA:  Fall, bilateral leg pain EXAM: LEFT KNEE - COMPLETE 4+ VIEW COMPARISON:  None. FINDINGS: Left knee arthroplasty, without evidence of complication. No fracture or dislocation is seen. Visualized soft tissues are within normal limits. No suprapatellar knee joint effusion. IMPRESSION: Left knee arthroplasty, without evidence of complication. No fracture or dislocation is seen. Electronically Signed   By: Julian Hy M.D.   On: 10/17/2020 03:30   DG Knee Complete 4 Views Right  Result Date: 10/17/2020 CLINICAL DATA:  Fall, bilateral leg pain EXAM: RIGHT KNEE - COMPLETE 4+ VIEW COMPARISON:  None. FINDINGS: Right knee arthroplasty. Suspected minimally displaced fracture along  the medial femoral condyle, only visualized on a single view. Visualized soft tissues are within normal limits. No suprapatellar knee joint effusion. IMPRESSION: Suspected medial femoral condyle fracture. Right knee arthroplasty. Electronically Signed   By: Julian Hy M.D.   On: 10/17/2020 03:33   DG Foot Complete Left  Result Date: 10/17/2020 CLINICAL DATA:  Fall, bilateral leg pain EXAM: LEFT FOOT - COMPLETE 3+ VIEW COMPARISON:  None. FINDINGS: Subtle lucency involving the proximal shaft of the 5th proximal phalanx. Correlate for point tenderness. Otherwise, no fracture is seen. Degenerative changes the 1st MTP joint. Suspected marginal erosion along the distal aspect of the this metatarsal. Small plantar calcaneal enthesophyte. The visualized soft tissues are unremarkable. IMPRESSION: Subtle lucency involving the proximal shaft of the 5th proximal phalanx. Correlate for point tenderness to exclude nondisplaced transverse fracture. Electronically Signed   By: Julian Hy M.D.   On: 10/17/2020 03:37   DG Foot Complete Right  Result Date: 10/17/2020 CLINICAL DATA:  Fall, bilateral leg pain EXAM: RIGHT FOOT COMPLETE - 3+ VIEW  COMPARISON:  None. FINDINGS: No fracture or dislocation is seen. Degenerative changes of the 1st MTP joint with marginal erosions, raising the possibility of gout. Small plantar calcaneal enthesophyte. IMPRESSION: No fracture or dislocation is seen. 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 Electronically Signed   By: Julian Hy M.D.   On: 10/17/2020 03:39      IMPRESSION AND PLAN:  Active Problems:   Fracture of femoral condyle, right, closed ([place])  1.  Closed fracture of the right femoral medial condyle.  Patient did show will be admitted to a medical-surgical bed. - Pain management will be provided. - We will be placing immobilizer. - Orthopedic consultation will be obtained. - Dr. Sharlet Salina was notified and is aware about the patient. - We will keep the patient n.p.o. for now. - We will hold off Eliquis for now. - Right knee CT is currently pending.  2.  Type 2 diabetes mellitus with peripheral neuropathy. - The patient will be placed on supplemental coverage with NovoLog and will continue Lantus at half home dose when he is NPO. - We will hold off metformin. - We will continue Lyrica.  4.  Essential hypertension. - We will continue lisinopril.  Her Lasix is continued as well.  5.  Gout. - We will continue colchicine and allopurinol.  6.  Paroxysmal atrial fibrillation. - We will hold off he is Eliquis.  7.  GERD. - We will continue H2 blocker therapy.  8.  History of intracranial hemorrhage. - We will continue Keppra.  9.  Fibromyalgia and depression with anxiety. - We will continue Cymbalta    DVT prophylaxis: SCDs.  Medical prophylaxis is held off pending decision operative procedure. Code Status: full code. Family Communication:  The plan of care was discussed in details with the patient (and family). I answered all questions. The patient agreed to proceed with the above mentioned plan. Further management will depend upon hospital  course. Disposition Plan: Back to previous home environment Consults called: Orthopedic consult. All the records are reviewed and case discussed with ED provider.   Status is: Inpatient   Remains inpatient appropriate because:Ongoing active pain requiring inpatient pain management, Ongoing diagnostic testing needed not appropriate for outpatient work up, Unsafe d/c plan, IV treatments appropriate due to intensity of illness or inability to take PO, and Inpatient level of care appropriate due to severity of illness   Dispo: The patient is from: Home  Anticipated d/c is to: Home              Patient currently is not medically stable to d/c.              Difficult to place patient No   TOTAL TIME TAKING CARE OF THIS PATIENT: 55 minutes.    Christel Mormon M.D on 10/17/2020 at 6:36 AM  [facility]   From 7 PM-7 AM, contact night-coverage [email or web address]  CC: Primary care physician; Albina Billet, MD

## 2020-10-17 NOTE — H&P (Addendum)
Traci Carter   PATIENT NAME: Traci Carter    MR#:  [id number]  DATE OF BIRTH:  06-26-1945  DATE OF ADMISSION:  10/17/2020  PRIMARY CARE PHYSICIAN: Albina Billet, MD   Patient is coming from: Home  REQUESTING/REFERRING PHYSICIAN: Valora Piccolo, MD  CHIEF COMPLAINT:   Chief Complaint  Patient presents with  . Leg Pain    HISTORY OF PRESENT ILLNESS:  Traci Carter is a 75 y.o. Caucasian female with medical history significant for type 2 diabetes mellitus, hypertension, CVA, atrial fibrillation, presented to the emergency room with acute onset of bilateral lower extremity pain described as aching and throbbing in graded 8/10 in severity.  Started before coming to the ER and encouraged when she tries to get out of her recliner.  While doing that she stated that her knees buckled underneath her and she fell straight down on both knees.  Later on any walking or movement of her knee or ankle worsened her lower extremity pain.  Pain improved with rest.  No presyncope or syncope.  No paresthesias or focal muscle weakness.  She denies any head injury with her mechanical fall.  No chest pain or dyspnea or palpitations.  No cough or wheezing or dyspnea.  No dysuria, oliguria or hematuria or flank pain.  ED Course: When she came to the ER vital signs were within normal.  Labs revealed EKG as reviewed by me : EKG is currently pending.  Imaging: Right knee x-ray showed suspected medial femoral condyle fracture and right knee arthropathy.  Left knee showed left knee arthropathy without evidence of complications. Right and left tib-fib x-ray came back negative.  Right and left ankle x-ray came back negative. Left foot x-ray showed subtle lucency involving the proximal shaft of the fifth proximal phalanx.  Right foot x-ray showed erosive arthropathy involving the first MTP joint which could be related to gout with no fracture or dislocation.  Dr. Sharlet Salina was notified about the patient.  Patient  was placed in knee immobilizer.  She will be admitted to a med-surgical bed for further evaluation and management. PAST MEDICAL HISTORY:   Past Medical History:  Diagnosis Date  . Afib ([place])   . Diabetes mellitus without complication ([place])   . Dysrhythmia   . Hypertension   . Stroke ([place]) 07/21/12   posterior superior left frontal lobe parenchymal hemorrhage     PAST SURGICAL HISTORY:   Past Surgical History:  Procedure Laterality Date  . ABDOMINAL HYSTERECTOMY    . CARPAL TUNNEL RELEASE    . COLONOSCOPY  01/02/2009   Dr Bary Castilla diverticulosis.  . COLONOSCOPY WITH PROPOFOL N/A 06/28/2017   Procedure: COLONOSCOPY WITH PROPOFOL;  Surgeon: Robert Bellow, MD;  Location: [facility] ENDOSCOPY;  Service: Endoscopy;  Laterality: N/A;  . ELBOW SURGERY    . INSERT / REPLACE / REMOVE PACEMAKER     Dr Saralyn Pilar  . JOINT REPLACEMENT  2005   knee  . SHOULDER ARTHROSCOPY      SOCIAL HISTORY:   Social History   Tobacco Use  . Smoking status: Never  . Smokeless tobacco: Never  Substance Use Topics  . Alcohol use: Not Currently    Alcohol/week: 1.0 standard drink    Types: 1 Cans of beer per week    Comment: rare    FAMILY HISTORY:   Family History  Problem Relation Age of Onset  . Breast cancer Mother   . Prostate cancer Father     DRUG ALLERGIES:  Allergies  Allergen Reactions  . Codeine Other (See Comments)    GI Distress  . Lanoxin [Digoxin]   . Zoloft [Sertraline Hcl]   . Penicillins Rash    REVIEW OF SYSTEMS:   ROS As per history of present illness. All pertinent systems were reviewed above. Constitutional, HEENT, cardiovascular, respiratory, GI, GU, musculoskeletal, neuro, psychiatric, endocrine, integumentary and hematologic systems were reviewed and are otherwise negative/unremarkable except for positive findings mentioned above in the HPI.   MEDICATIONS AT HOME:   Prior to Admission medications   Medication Sig Start Date End Date Taking? Authorizing  Provider  acetaminophen (TYLENOL) 325 MG tablet Take 2 tablets (650 mg total) by mouth every 6 (six) hours as needed for mild pain, headache, fever or moderate pain. 09/21/20   Elgergawy, Silver Huguenin, MD  albuterol (PROVENTIL) (2.5 MG/3ML) 0.083% nebulizer solution Inhale 3 mLs into the lungs every 4 (four) hours as needed for wheezing or shortness of breath. 09/21/20   Elgergawy, Silver Huguenin, MD  apixaban (ELIQUIS) 5 MG TABS tablet Take 1 tablet (5 mg total) by mouth 2 (two) times daily. 07/01/17   Robert Bellow, MD  atorvastatin (LIPITOR) 20 MG tablet Take 20 mg by mouth daily.    [provider]  bismuth subsalicylate (PEPTO BISMOL) 262 MG/15ML suspension Take 30 mLs by mouth daily.    [provider]  colchicine 0.6 MG tablet Take 1 tablet (0.6 mg total) by mouth daily as needed (gout). 09/21/20   Elgergawy, Silver Huguenin, MD  DULoxetine (CYMBALTA) 60 MG capsule Take 60 mg by mouth daily. 09/08/20   [provider]  famotidine (PEPCID) 20 MG tablet Take 20 mg by mouth daily. 07/27/19   [provider]  furosemide (LASIX) 20 MG tablet Take 1 tablet (20 mg total) by mouth daily. 10/18/12   Philmore Pali, NP  insulin aspart (NOVOLOG) 100 UNIT/ML injection Inject 0-9 Units into the skin 3 (three) times daily with meals. 09/21/20   Elgergawy, Silver Huguenin, MD  levETIRAcetam (KEPPRA) 1000 MG tablet Take 1 tablet (1,000 mg total) by mouth 2 (two) times daily. 10/18/12   Philmore Pali, NP  lisinopril (PRINIVIL,ZESTRIL) 5 MG tablet Take 1 tablet (5 mg total) by mouth daily. 10/18/12   Philmore Pali, NP  montelukast (SINGULAIR) 10 MG tablet  11/07/13   [provider]  polyethylene glycol (MIRALAX / GLYCOLAX) 17 g packet Take 34 g by mouth daily as needed. 09/21/20   Elgergawy, Silver Huguenin, MD  pregabalin (LYRICA) 50 MG capsule Take 1 capsule (50 mg total) by mouth 2 (two) times daily. 09/21/20 09/21/21  Elgergawy, Silver Huguenin, MD  senna-docusate (SENOKOT-S) 8.6-50 MG tablet Take 2 tablets by mouth  2 (two) times daily. 09/21/20   Elgergawy, Silver Huguenin, MD  zolpidem (AMBIEN) 5 MG tablet Take 1 tablet (5 mg total) by mouth at bedtime as needed for sleep. 09/21/20   Elgergawy, Silver Huguenin, MD      VITAL SIGNS:  Blood pressure 122/65, pulse 90, temperature 98.2 F (36.8 C), temperature source Oral, resp. rate 16, height 5\' 3"  (1.6 m), weight 110.7 kg, SpO2 95 %.  PHYSICAL EXAMINATION:  Physical Exam  GENERAL:  75 y.o.-year-old Caucasian female patient lying in the bed with no acute distress.  EYES: Pupils equal, round, reactive to light and accommodation. No scleral icterus. Extraocular muscles intact.  HEENT: Head atraumatic, normocephalic. Oropharynx and nasopharynx clear.  NECK:  Supple, no jugular venous distention. No thyroid enlargement, no tenderness.  LUNGS: Normal  breath sounds bilaterally, no wheezing, rales,rhonchi or crepitation. No use of accessory muscles of respiration.  CARDIOVASCULAR: Regular rate and rhythm, S1, S2 normal. No murmurs, rubs, or gallops.  ABDOMEN: Soft, nondistended, nontender. Bowel sounds present. No organomegaly or mass.  EXTREMITIES: No pedal edema, cyanosis, or clubbing.  NEUROLOGIC: Cranial nerves II through XII are intact. Muscle strength 5/5 in all extremities. Sensation intact. Gait not checked. Musculoskeletal: Right knee is tender with decreased range of motion secondary to pain PSYCHIATRIC: The patient is alert and oriented x 3.  Normal affect and good eye contact. SKIN: No obvious rash, lesion, or ulcer.   LABORATORY PANEL:   CBC No results for input(s): WBC, HGB, HCT, PLT in the last 168 hours. ------------------------------------------------------------------------------------------------------------------  Chemistries  No results for input(s): NA, K, CL, CO2, GLUCOSE, BUN, CREATININE, CALCIUM, MG, AST, ALT, ALKPHOS, BILITOT in the last 168 hours.  Invalid input(s):  GFRCGP ------------------------------------------------------------------------------------------------------------------  Cardiac Enzymes No results for input(s): TROPONINI in the last 168 hours. ------------------------------------------------------------------------------------------------------------------  RADIOLOGY:  DG Tibia/Fibula Left  Result Date: 10/17/2020 CLINICAL DATA:  Fall, bilateral leg pain EXAM: LEFT TIBIA AND FIBULA - 2 VIEW COMPARISON:  None. FINDINGS: No fracture or dislocation is seen. The joint spaces are preserved. The visualized soft tissues are unremarkable. IMPRESSION: Negative. Electronically Signed   By: Julian Hy M.D.   On: 10/17/2020 03:33   DG Tibia/Fibula Right  Result Date: 10/17/2020 CLINICAL DATA:  Fall, bilateral leg pain EXAM: RIGHT TIBIA AND FIBULA - 2 VIEW COMPARISON:  None. FINDINGS: No fracture or dislocation is seen. The joint spaces are preserved. Visualized soft tissues are within normal limits. IMPRESSION: Negative. Electronically Signed   By: Julian Hy M.D.   On: 10/17/2020 03:34   DG Ankle Complete Left  Result Date: 10/17/2020 CLINICAL DATA:  Fall, bilateral leg pain EXAM: LEFT ANKLE COMPLETE - 3+ VIEW COMPARISON:  None. FINDINGS: No fracture or dislocation is seen. The ankle mortise is intact. Small plantar calcaneal enthesophyte. Visualized soft tissues are within normal limits. IMPRESSION: Negative. Electronically Signed   By: Julian Hy M.D.   On: 10/17/2020 03:34   DG Ankle Complete Right  Result Date: 10/17/2020 CLINICAL DATA:  Fall, bilateral leg pain EXAM: RIGHT ANKLE - COMPLETE 3+ VIEW COMPARISON:  None. FINDINGS: No fracture or dislocation is seen. The ankle mortise is intact. Small plantar calcaneal these fight. Visualized soft tissues are within normal limits. IMPRESSION: Negative. Electronically Signed   By: Julian Hy M.D.   On: 10/17/2020 03:35   DG Knee Complete 4 Views Left  Result Date:  10/17/2020 CLINICAL DATA:  Fall, bilateral leg pain EXAM: LEFT KNEE - COMPLETE 4+ VIEW COMPARISON:  None. FINDINGS: Left knee arthroplasty, without evidence of complication. No fracture or dislocation is seen. Visualized soft tissues are within normal limits. No suprapatellar knee joint effusion. IMPRESSION: Left knee arthroplasty, without evidence of complication. No fracture or dislocation is seen. Electronically Signed   By: Julian Hy M.D.   On: 10/17/2020 03:30   DG Knee Complete 4 Views Right  Result Date: 10/17/2020 CLINICAL DATA:  Fall, bilateral leg pain EXAM: RIGHT KNEE - COMPLETE 4+ VIEW COMPARISON:  None. FINDINGS: Right knee arthroplasty. Suspected minimally displaced fracture along the medial femoral condyle, only visualized on a single view. Visualized soft tissues are within normal limits. No suprapatellar knee joint effusion. IMPRESSION: Suspected medial femoral condyle fracture. Right knee arthroplasty. Electronically Signed   By: Julian Hy M.D.   On: 10/17/2020 03:33   DG Foot Complete  Left  Result Date: 10/17/2020 CLINICAL DATA:  Fall, bilateral leg pain EXAM: LEFT FOOT - COMPLETE 3+ VIEW COMPARISON:  None. FINDINGS: Subtle lucency involving the proximal shaft of the 5th proximal phalanx. Correlate for point tenderness. Otherwise, no fracture is seen. Degenerative changes the 1st MTP joint. Suspected marginal erosion along the distal aspect of the this metatarsal. Small plantar calcaneal enthesophyte. The visualized soft tissues are unremarkable. IMPRESSION: Subtle lucency involving the proximal shaft of the 5th proximal phalanx. Correlate for point tenderness to exclude nondisplaced transverse fracture. Electronically Signed   By: Julian Hy M.D.   On: 10/17/2020 03:37   DG Foot Complete Right  Result Date: 10/17/2020 CLINICAL DATA:  Fall, bilateral leg pain EXAM: RIGHT FOOT COMPLETE - 3+ VIEW COMPARISON:  None. FINDINGS: No fracture or dislocation is seen.  Degenerative changes of the 1st MTP joint with marginal erosions, raising the possibility of gout. Small plantar calcaneal enthesophyte. IMPRESSION: No fracture or dislocation is seen. 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 Electronically Signed   By: Julian Hy M.D.   On: 10/17/2020 03:39      IMPRESSION AND PLAN:  Active Problems:   Fracture of femoral condyle, right, closed ([place])  1.  Closed fracture of the right femoral medial condyle.  Patient did show will be admitted to a medical-surgical bed. - Pain management will be provided. - We will be placing immobilizer. - Orthopedic consultation will be obtained. - Dr. Sharlet Salina was notified and is aware about the patient. - We will keep the patient n.p.o. for now. - We will hold off Eliquis for now. - Right knee CT is currently pending.  2.  Type 2 diabetes mellitus with peripheral neuropathy. - The patient will be placed on supplemental coverage with NovoLog and will continue Lantus at half home dose when he is NPO. - We will hold off metformin. - We will continue Lyrica.  4.  Essential hypertension. - We will continue lisinopril.  Her Lasix is continued as well - We will need to follow her creatinine.  Both Lasix and lisinopril can be held off if patient has elevated creatinine.  5.  Gout. - We will continue colchicine and allopurinol.  6.  Paroxysmal atrial fibrillation. - We will hold off he is Eliquis.  7.  GERD. - We will continue H2 blocker therapy.  8.  History of intracranial hemorrhage. - We will continue Keppra.  9.  Fibromyalgia and depression with anxiety. - We will continue Cymbalta   DVT prophylaxis: SCDs.  Medical prophylaxis is held off pending decision operative procedure. Code Status: full code. Family Communication:  The plan of care was discussed in details with the patient (and family). I answered all questions. The patient agreed to proceed with the above  mentioned plan. Further management will depend upon hospital course. Disposition Plan: Back to previous home environment Consults called: Orthopedic consult. All the records are reviewed and case discussed with ED provider.  Status is: Inpatient  Remains inpatient appropriate because:Ongoing active pain requiring inpatient pain management, Ongoing diagnostic testing needed not appropriate for outpatient work up, Unsafe d/c plan, IV treatments appropriate due to intensity of illness or inability to take PO, and Inpatient level of care appropriate due to severity of illness  Dispo: The patient is from: Home              Anticipated d/c is to: Home              Patient currently  is not medically stable to d/c.   Difficult to place patient No  TOTAL TIME TAKING CARE OF THIS PATIENT: 55 minutes.    Christel Mormon M.D on 10/17/2020 at 5:46 AM  [facility]   From 7 PM-7 AM, contact night-coverage [email or web address]  CC: Primary care physician; Albina Billet, MD

## 2020-10-17 NOTE — Progress Notes (Signed)
Orthopedics was consulted this morning regarding XR finding of a medial condyle fracture after a recent patient fall. CT scan shows presence of a minimally displaced medial condyle fracture with callus formation. This was present on the prior CT scan on 6/15. The knee arthroplasty appears stable. The patient will be managed non-operatively with pain control and PT. Patient can WBAT, does not need knee immobilizer and can do ROM as tolerated. She may benefit from a t-ROM knee brace unlocked for knee ROM.

## 2020-10-17 NOTE — ED Provider Notes (Signed)
[facility] Emergency Department Provider Note   ____________________________________________   Event Date/Time   First MD Initiated Contact with Patient 10/17/20 [phone]     (approximate)  I have reviewed the triage vital signs and the nursing notes.   HISTORY  Chief Complaint Leg Pain    HPI Traci Carter is a 75 y.o. female who presents complaining of bilateral lower extremity pain  LOCATION: Bilateral lower extremities DURATION: Began just prior to arrival TIMING: Stable since onset SEVERITY: 8/10 QUALITY: Aching/throbbing CONTEXT: Patient states that she was attempting to get up out of her recliner when her knees buckled underneath her and she fell straight down onto both knees MODIFYING FACTORS: Any attempts at walking or movement at the knee or ankle joint worsens her lower extremity pain and rest partially relieves it ASSOCIATED SYMPTOMS: Denies   Per medical record review patient was just discharged from rehab for a significant gouty arthropathy.  Patient also has history of bilateral knee arthroplasty          Past Medical History:  Diagnosis Date   Afib ([place])    Diabetes mellitus without complication ([place])    Dysrhythmia    Hypertension    Stroke ([place]) 07/21/12   posterior superior left frontal lobe parenchymal hemorrhage     Patient Active Problem List   Diagnosis Date Noted   Fracture of femoral condyle, right, closed ([place]) 10/17/2020   Generalized weakness 09/16/2020   Type II diabetes mellitus with renal manifestations ([place]) 09/16/2020   CKD (chronic kidney disease), stage IIIa 09/16/2020   Depression with anxiety 09/16/2020   Fall 09/16/2020   UTI (urinary tract infection) 09/16/2020   Seizure ([place]) 09/16/2020   Diabetes mellitus type 2, uncomplicated ([place]) 36/64/4034   Chest pain with low risk for cardiac etiology 12/27/2018   SOB (shortness of breath) on exertion 12/27/2018   Paroxysmal atrial fibrillation ([place])  10/16/2018   Hyperlipidemia associated with type 2 diabetes mellitus ([place]) 10/16/2018   Pacemaker 10/16/2018   History of CVA (cerebrovascular accident) 10/16/2018   OSA (obstructive sleep apnea) 10/16/2018   Fibromyalgia 10/16/2018   CAD (coronary artery disease) 10/16/2018   Diarrhea 06/09/2017   Hyperlipemia 08/15/2013   ICH (intracerebral hemorrhage) ([place]) 07/21/2012   Diabetes ([place]) 07/21/2012   Essential hypertension 07/21/2012   CVA (cerebral vascular accident) ([place]) 07/20/2012    Past Surgical History:  Procedure Laterality Date   ABDOMINAL HYSTERECTOMY     CARPAL TUNNEL RELEASE     COLONOSCOPY  01/02/2009   Dr Bary Castilla diverticulosis.   COLONOSCOPY WITH PROPOFOL N/A 06/28/2017   Procedure: COLONOSCOPY WITH PROPOFOL;  Surgeon: Robert Bellow, MD;  Location: [facility] ENDOSCOPY;  Service: Endoscopy;  Laterality: N/A;   ELBOW SURGERY     INSERT / REPLACE / REMOVE PACEMAKER     Dr Saralyn Pilar   JOINT REPLACEMENT  2005   knee   SHOULDER ARTHROSCOPY      Prior to Admission medications   Medication Sig Start Date End Date Taking? Authorizing Provider  acetaminophen (TYLENOL) 325 MG tablet Take 2 tablets (650 mg total) by mouth every 6 (six) hours as needed for mild pain, headache, fever or moderate pain. 09/21/20   Elgergawy, Silver Huguenin, MD  albuterol (PROVENTIL) (2.5 MG/3ML) 0.083% nebulizer solution Inhale 3 mLs into the lungs every 4 (four) hours as needed for wheezing or shortness of breath. 09/21/20   Elgergawy, Silver Huguenin, MD  apixaban (ELIQUIS) 5 MG TABS tablet Take 1 tablet (5 mg total) by mouth  2 (two) times daily. 07/01/17   Robert Bellow, MD  atorvastatin (LIPITOR) 20 MG tablet Take 20 mg by mouth daily.    [provider]  bismuth subsalicylate (PEPTO BISMOL) 262 MG/15ML suspension Take 30 mLs by mouth daily.    [provider]  colchicine 0.6 MG tablet Take 1 tablet (0.6 mg total) by mouth daily as needed (gout). 09/21/20   Elgergawy, Silver Huguenin, MD   DULoxetine (CYMBALTA) 60 MG capsule Take 60 mg by mouth daily. 09/08/20   [provider]  famotidine (PEPCID) 20 MG tablet Take 20 mg by mouth daily. 07/27/19   [provider]  furosemide (LASIX) 20 MG tablet Take 1 tablet (20 mg total) by mouth daily. 10/18/12   Philmore Pali, NP  insulin aspart (NOVOLOG) 100 UNIT/ML injection Inject 0-9 Units into the skin 3 (three) times daily with meals. 09/21/20   Elgergawy, Silver Huguenin, MD  levETIRAcetam (KEPPRA) 1000 MG tablet Take 1 tablet (1,000 mg total) by mouth 2 (two) times daily. 10/18/12   Philmore Pali, NP  lisinopril (PRINIVIL,ZESTRIL) 5 MG tablet Take 1 tablet (5 mg total) by mouth daily. 10/18/12   Philmore Pali, NP  montelukast (SINGULAIR) 10 MG tablet  11/07/13   [provider]  polyethylene glycol (MIRALAX / GLYCOLAX) 17 g packet Take 34 g by mouth daily as needed. 09/21/20   Elgergawy, Silver Huguenin, MD  pregabalin (LYRICA) 50 MG capsule Take 1 capsule (50 mg total) by mouth 2 (two) times daily. 09/21/20 09/21/21  Elgergawy, Silver Huguenin, MD  senna-docusate (SENOKOT-S) 8.6-50 MG tablet Take 2 tablets by mouth 2 (two) times daily. 09/21/20   Elgergawy, Silver Huguenin, MD  zolpidem (AMBIEN) 5 MG tablet Take 1 tablet (5 mg total) by mouth at bedtime as needed for sleep. 09/21/20   Elgergawy, Silver Huguenin, MD    Allergies Codeine, Lanoxin [digoxin], Zoloft [sertraline hcl], and Penicillins  Family History  Problem Relation Age of Onset   Breast cancer Mother    Prostate cancer Father     Social History Social History   Tobacco Use   Smoking status: Never   Smokeless tobacco: Never  Vaping Use   Vaping Use: Never used  Substance Use Topics   Alcohol use: Not Currently    Alcohol/week: 1.0 standard drink    Types: 1 Cans of beer per week    Comment: rare   Drug use: No    Review of Systems Constitutional: No fever/chills Eyes: No visual changes. ENT: No sore throat. Cardiovascular: Denies chest pain. Respiratory: Denies shortness of  breath. Gastrointestinal: No abdominal pain.  No nausea, no vomiting.  No diarrhea. Genitourinary: Negative for dysuria. Musculoskeletal: Endorses acute bilateral lower extremity pain from the knee down Skin: Negative for rash. Neurological: Negative for headaches, weakness/numbness/paresthesias in any extremity Psychiatric: Negative for suicidal ideation/homicidal ideation   ____________________________________________   PHYSICAL EXAM:  VITAL SIGNS: ED Triage Vitals  Enc Vitals Group     BP 10/16/20 2202 122/65     Pulse Rate 10/16/20 2202 90     Resp 10/16/20 2202 16     Temp 10/16/20 2202 98.2 F (36.8 C)     Temp Source 10/16/20 2202 Oral     SpO2 10/16/20 2202 95 %     Weight 10/16/20 2203 244 lb (110.7 kg)     Height 10/16/20 2203 5\' 3"  (1.6 m)     Head Circumference --      Peak Flow --  Pain Score 10/16/20 2202 8     Pain Loc --      Pain Edu? --      Excl. in [place]? --    Constitutional: Alert and oriented. Well appearing and in no acute distress. Eyes: Conjunctivae are normal. PERRL. Head: Atraumatic. Nose: No congestion/rhinnorhea. Mouth/Throat: Mucous membranes are moist. Neck: No stridor Cardiovascular: Grossly normal heart sounds.  Good peripheral circulation. Respiratory: Normal respiratory effort.  No retractions. Gastrointestinal: Soft and nontender. No distention. Musculoskeletal: No obvious deformities.  Point tenderness to palpation over the medial aspect of the right knee as well as over the dorsum of the left foot Neurologic:  Normal speech and language. No gross focal neurologic deficits are appreciated. Skin:  Skin is warm and dry. No rash noted. Psychiatric: Mood and affect are normal. Speech and behavior are normal.  ____________________________________________   LABS (all labs ordered are listed, but only abnormal results are displayed)  Labs Reviewed  RESP PANEL BY RT-PCR (FLU A&B, COVID) ARPGX2  COMPREHENSIVE METABOLIC PANEL  CBC  WITH DIFFERENTIAL/PLATELET  TYPE AND SCREEN  TROPONIN I (HIGH SENSITIVITY)    RADIOLOGY  ED MD interpretation: X-ray from knee to foot bilaterally shows a medial femoral epicondyle fracture on the right and a proximal shaft fifth phalanx fracture on the left foot  Official radiology report(s): DG Tibia/Fibula Left  Result Date: 10/17/2020 CLINICAL DATA:  Fall, bilateral leg pain EXAM: LEFT TIBIA AND FIBULA - 2 VIEW COMPARISON:  None. FINDINGS: No fracture or dislocation is seen. The joint spaces are preserved. The visualized soft tissues are unremarkable. IMPRESSION: Negative. Electronically Signed   By: Julian Hy M.D.   On: 10/17/2020 03:33   DG Tibia/Fibula Right  Result Date: 10/17/2020 CLINICAL DATA:  Fall, bilateral leg pain EXAM: RIGHT TIBIA AND FIBULA - 2 VIEW COMPARISON:  None. FINDINGS: No fracture or dislocation is seen. The joint spaces are preserved. Visualized soft tissues are within normal limits. IMPRESSION: Negative. Electronically Signed   By: Julian Hy M.D.   On: 10/17/2020 03:34   DG Ankle Complete Left  Result Date: 10/17/2020 CLINICAL DATA:  Fall, bilateral leg pain EXAM: LEFT ANKLE COMPLETE - 3+ VIEW COMPARISON:  None. FINDINGS: No fracture or dislocation is seen. The ankle mortise is intact. Small plantar calcaneal enthesophyte. Visualized soft tissues are within normal limits. IMPRESSION: Negative. Electronically Signed   By: Julian Hy M.D.   On: 10/17/2020 03:34   DG Ankle Complete Right  Result Date: 10/17/2020 CLINICAL DATA:  Fall, bilateral leg pain EXAM: RIGHT ANKLE - COMPLETE 3+ VIEW COMPARISON:  None. FINDINGS: No fracture or dislocation is seen. The ankle mortise is intact. Small plantar calcaneal these fight. Visualized soft tissues are within normal limits. IMPRESSION: Negative. Electronically Signed   By: Julian Hy M.D.   On: 10/17/2020 03:35   DG Knee Complete 4 Views Left  Result Date: 10/17/2020 CLINICAL DATA:  Fall,  bilateral leg pain EXAM: LEFT KNEE - COMPLETE 4+ VIEW COMPARISON:  None. FINDINGS: Left knee arthroplasty, without evidence of complication. No fracture or dislocation is seen. Visualized soft tissues are within normal limits. No suprapatellar knee joint effusion. IMPRESSION: Left knee arthroplasty, without evidence of complication. No fracture or dislocation is seen. Electronically Signed   By: Julian Hy M.D.   On: 10/17/2020 03:30   DG Knee Complete 4 Views Right  Result Date: 10/17/2020 CLINICAL DATA:  Fall, bilateral leg pain EXAM: RIGHT KNEE - COMPLETE 4+ VIEW COMPARISON:  None. FINDINGS: Right knee arthroplasty. Suspected  minimally displaced fracture along the medial femoral condyle, only visualized on a single view. Visualized soft tissues are within normal limits. No suprapatellar knee joint effusion. IMPRESSION: Suspected medial femoral condyle fracture. Right knee arthroplasty. Electronically Signed   By: Julian Hy M.D.   On: 10/17/2020 03:33   DG Foot Complete Left  Result Date: 10/17/2020 CLINICAL DATA:  Fall, bilateral leg pain EXAM: LEFT FOOT - COMPLETE 3+ VIEW COMPARISON:  None. FINDINGS: Subtle lucency involving the proximal shaft of the 5th proximal phalanx. Correlate for point tenderness. Otherwise, no fracture is seen. Degenerative changes the 1st MTP joint. Suspected marginal erosion along the distal aspect of the this metatarsal. Small plantar calcaneal enthesophyte. The visualized soft tissues are unremarkable. IMPRESSION: Subtle lucency involving the proximal shaft of the 5th proximal phalanx. Correlate for point tenderness to exclude nondisplaced transverse fracture. Electronically Signed   By: Julian Hy M.D.   On: 10/17/2020 03:37   DG Foot Complete Right  Result Date: 10/17/2020 CLINICAL DATA:  Fall, bilateral leg pain EXAM: RIGHT FOOT COMPLETE - 3+ VIEW COMPARISON:  None. FINDINGS: No fracture or dislocation is seen. Degenerative changes of the 1st MTP  joint with marginal erosions, raising the possibility of gout. Small plantar calcaneal enthesophyte. IMPRESSION: No fracture or dislocation is seen. 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 Electronically Signed   By: Julian Hy M.D.   On: 10/17/2020 03:39    ____________________________________________   PROCEDURES  Procedure(s) performed (including Critical Care):  .1-3 Lead EKG Interpretation  Date/Time: 10/17/2020 5:42 AM Performed by: Naaman Plummer, MD Authorized by: Naaman Plummer, MD     Interpretation: normal     ECG rate:  89   ECG rate assessment: normal     Rhythm: sinus rhythm     Ectopy: none     Conduction: normal     ____________________________________________   INITIAL IMPRESSION / ASSESSMENT AND PLAN / ED COURSE  As part of my medical decision making, I reviewed the following data within the electronic medical record, if available:  Nursing notes reviewed and incorporated, Labs reviewed, EKG interpreted, Old chart reviewed, Radiograph reviewed and Notes from prior ED visits reviewed and incorporated     Patient is a 75 year old female that presents for bilateral lower extremity pain Workup: XR knee/tib/fib/ankle/foot bilaterally Findings: Right medial femoral condyle fracture without dislocation and left fifth phalanx fracture  Consult: Orthopedic Surgery (query fascia iliacus block vs continued opiate pain control), hospitalist  Patient does not currently demonstrate complications of fracture such as compartment syndrome, arterial or nerve injury.  Interventions: analgesia Disposition: Admit      ____________________________________________   FINAL CLINICAL IMPRESSION(S) / ED DIAGNOSES  Final diagnoses:  Closed nondisplaced fracture of medial condyle of right femur, initial encounter ([place])  Fall, initial encounter  Closed fracture of proximal phalanx of lesser toe of left foot, physeal involvement  unspecified, initial encounter     ED Discharge Orders     None        Note:  This document was prepared using Dragon voice recognition software and may include unintentional dictation errors.    Naaman Plummer, MD 10/17/20 [phone]

## 2020-10-18 LAB — BASIC METABOLIC PANEL
Anion gap: 5 (ref 5–15)
BUN: 18 mg/dL (ref 8–23)
CO2: 32 mmol/L (ref 22–32)
Calcium: 8.6 mg/dL — ABNORMAL LOW (ref 8.9–10.3)
Chloride: 102 mmol/L (ref 98–111)
Creatinine, Ser: 1.16 mg/dL — ABNORMAL HIGH (ref 0.44–1.00)
GFR, Estimated: 50 mL/min — ABNORMAL LOW (ref >60)
Glucose, Bld: 307 mg/dL — ABNORMAL HIGH (ref 70–99)
Potassium: 4.3 mmol/L (ref 3.5–5.1)
Sodium: 139 mmol/L (ref 135–145)

## 2020-10-18 LAB — CBC
HCT: 41.6 % (ref 36.0–46.0)
Hemoglobin: 13.4 g/dL (ref 12.0–15.0)
MCH: 29.9 pg (ref 26.0–34.0)
MCHC: 32.2 g/dL (ref 30.0–36.0)
MCV: 92.9 fL (ref 80.0–100.0)
Platelets: 134 10*3/uL — ABNORMAL LOW (ref 150–400)
RBC: 4.48 MIL/uL (ref 3.87–5.11)
RDW: 14.6 % (ref 11.5–15.5)
WBC: 7.7 10*3/uL (ref 4.0–10.5)
nRBC: 0 % (ref 0.0–0.2)

## 2020-10-18 LAB — GLUCOSE, CAPILLARY
Glucose-Capillary: 245 mg/dL — ABNORMAL HIGH (ref 70–99)
Glucose-Capillary: 264 mg/dL — ABNORMAL HIGH (ref 70–99)
Glucose-Capillary: 209 mg/dL — ABNORMAL HIGH (ref 70–99)
Glucose-Capillary: 269 mg/dL — ABNORMAL HIGH (ref 70–99)

## 2020-10-18 NOTE — TOC Progression Note (Deleted)
Transition of Care [facility]) - Progression Note    Patient Details  Name: PRISILLA KOCSIS MRN: [id number] Date of Birth: Oct 15, 1945  [facility]) CM/SW Contact  Izola Price, RN Phone Number: 10/18/2020, 12:12 PM  Clinical Narrative:            Expected Discharge Plan and Services                                                 Social Determinants of Health (SDOH) Interventions    Readmission Risk Interventions No flowsheet data found.

## 2020-10-18 NOTE — Progress Notes (Signed)
[facility] Progress Note  Patient: Traci Carter    OJJ:[id number]  DOA: 10/17/2020     Date of Service: the patient was seen and examined on 10/18/2020  Brief hospital course: Past medical history of type II DM, HTN, CVA, chronic A. fib presents with complaints of bilateral knee pain after a fall at home. Underwent CT scan which showed chronic fracture of knee with stable prosthetic hardware. Orthopedic recommends conservative management with pain control. Currently plan is arrange safe discharge at [facility].  Subjective: Pain well controlled.  No nausea no vomiting.  No fever no chills.  No chest pain.  Reports constipation.  Assessment and Plan: 1.  Closed fracture of the right femoral medial condyle.   Presents with a fall. CT scan of right knee shows presence of minimally displaced medial condyle fracture which was present on CT scan on 6/15. Knee arthroplasty appears stable. Nonoperative management with pain control recommended. WBAT. ROM as tolerated. ROM knee brace recommended.  2.  Type 2 diabetes mellitus with peripheral neuropathy. - The patient will be placed on supplemental coverage with NovoLog and will continue Lantus at half home dose when he is NPO. - We will hold off metformin. - We will continue Lyrica.  4.  Essential hypertension. - Blood pressure is soft.  Holding blood pressure medication right now.  5.  Gout. - We will holding colchicine and allopurinol.  6.  Paroxysmal atrial fibrillation. - Resume Eliquis.  7.  GERD. - We will continue H2 blocker therapy.  8.  History of intracranial hemorrhage. - We will continue Keppra.  9.  Fibromyalgia and depression with anxiety. - We will continue Cymbalta   Scheduled Meds:  apixaban  5 mg Oral BID   atorvastatin  20 mg Oral Daily   DULoxetine  60 mg Oral Daily   famotidine  20 mg Oral Daily   insulin aspart  0-5 Units Subcutaneous QHS   insulin aspart  0-9 Units Subcutaneous TID WC   levETIRAcetam   1,000 mg Oral BID   montelukast  10 mg Oral QHS   pregabalin  50 mg Oral BID   senna-docusate  2 tablet Oral BID   Continuous Infusions: PRN Meds: acetaminophen **OR** acetaminophen, albuterol, magnesium hydroxide, morphine injection, ondansetron **OR** ondansetron (ZOFRAN) IV, oxyCODONE-acetaminophen, polyethylene glycol, traZODone, zolpidem  Body mass index is 43.22 kg/m.        DVT Prophylaxis:   SCDs Start: 10/17/20 0544 apixaban (ELIQUIS) tablet 5 mg    Advance goals of care discussion: Pt is Full code.  Family Communication: no family was present at bedside, at the time of interview.   Data Reviewed: I have personally reviewed and interpreted daily labs, tele strips, imaging. Hemoglobin stable.  WBC 7.7.  Serum creatinine mildly elevated 1.16.  Potassium stable.  Physical Exam:  General: Appear in mild distress, no Rash; Oral Mucosa Clear, moist. no Abnormal Neck Mass Or lumps, Conjunctiva normal  Cardiovascular: S1 and S2 Present, no Murmur, Respiratory: good respiratory effort, Bilateral Air entry present and CTA, no Crackles, no wheezes Abdomen: Bowel Sound present, Soft and no tenderness Extremities: no Pedal edema Neurology: alert and oriented to time, place, and person affect appropriate. no new focal deficit Gait not checked due to patient safety concerns   Vitals:   10/17/20 2126 10/18/20 0416 10/18/20 0804 10/18/20 1525  BP: 116/77 111/83 114/81 107/83  Pulse: 86 91 71 90  Resp: 17 17 16 16   Temp: (!) 97.5 F (36.4 C) 97.7 F (36.5 C)  97.6 F (36.4 C) 98.3 F (36.8 C)  TempSrc:      SpO2: 97% 94% (!) 74% 97%  Weight:      Height:        Disposition:  Status is: Inpatient  Remains inpatient appropriate because:IV treatments appropriate due to intensity of illness or inability to take PO  Dispo: The patient is from: Home              Anticipated d/c is to: Home              Patient currently is not medically stable to d/c.   Difficult to place  patient No  Time spent: 35 minutes. I reviewed all nursing notes, pharmacy notes, vitals, pertinent old records. I have discussed plan of care as described above with RN.  Author: Berle Mull, MD [facility] 10/18/2020 6:14 PM  To reach On-call, see care teams to locate the attending and reach out via [email or web address]. Between 7PM-7AM, please contact night-coverage If you still have difficulty reaching the attending provider, please page the [facility] (Director on Call) for [facility] on amion for assistance.

## 2020-10-18 NOTE — TOC Progression Note (Addendum)
Transition of Care [facility]) - Progression Note    Patient Details  Name: Traci Carter MRN: [id number] Date of Birth: [birth year]  Transition of Care [facility]) CM/SW Contact  Izola Price, RN Phone Number: 10/18/2020, 9:29 AM  Clinical Narrative:  7/17 New admit 10/17/20 after knees bucked while getting out of recliner at home and 8/10 pain ensued. Ortho consult  note of 7/16: Conservative management at this time, no surgery planned, WBAT. Disposition of SNF noted in provider notes for [facility] consult, awaiting PT/OT evaluations. Simmie Davies RN CM   1200 noon update: PT/OT rec SNF. Patient was just in [place] for STR prior to this incident that brought patient to [facility] ED. Simmie Davies RN CM        Expected Discharge Plan and Services                                                 Social Determinants of Health (SDOH) Interventions    Readmission Risk Interventions No flowsheet data found.

## 2020-10-18 NOTE — Evaluation (Signed)
Physical Therapy Evaluation Patient Details Name: Traci Carter MRN: [id number] DOB: [birth year] Today's Date: 10/18/2020   History of Present Illness  Pt is a 75 y/o F who presented to the ER with acute onset of BLE pain. Per pt, at home she tried to get out of the recliner when her knees buckled & she fell straight down on B knees. Right knee x-ray showed suspected medial femoral condyle fracture and right knee arthropathy.  Left knee showed left knee arthropathy without evidence of complications. Orthopedics was consulted. CT scan shows presence of a minimally displaced medial condyle fracture with callus formation that was present on the prior CT scan on 6/15. Knee arthroplasty appears stable & pt will be managed nonoperatively. PMH: DM2, HTN, CVA (intracranial hemorrhage, 2014), a-fib, fibromyalgia, depression, anxiety, gout  Clinical Impression  Pt seen for PT evaluation with pt agreeable to tx. Pt very discouraged re: situation as she just returned home from STR the morning this even occurred - PT provided encouragement. Pt is able to complete bed mobility without physical assistance but heavy reliance on hospital bed features (HOB significantly elevated, bed rails) & extra time. Pt requires min assist for all sit<>stand transfers and ongoing heavy cuing for safe hand placement. Pt is able to ambulate 2 bouts in room with max distance of 6 ft with RW & min assist but with very impaired gait pattern, see below. Pt with very poor foot clearance during mobility which also increases her fall risk. Pt would benefit from STR upon d/c to maximize independence with functional mobility, reduce fall risk, & decrease caregiver burden prior to return home & pt aware of this. Will continue to follow pt acutely to progress gait & address balance deficits.      Follow Up Recommendations SNF;Supervision/Assistance - 24 hour    Equipment Recommendations   (TBD in next venue)    Recommendations for Other  Services       Precautions / Restrictions Precautions Precautions: Fall Restrictions Weight Bearing Restrictions: Yes RLE Weight Bearing: Weight bearing as tolerated      Mobility  Bed Mobility Overal bed mobility: Needs Assistance Bed Mobility: Supine to Sit     Supine to sit: Supervision;HOB elevated     General bed mobility comments: extra time, reliance on bed rails, HOB significantly elevated    Transfers Overall transfer level: Needs assistance Equipment used: Rolling walker (2 wheeled) Transfers: Sit to/from [facility] Sit to Stand: Min assist;From elevated surface Stand pivot transfers: Min assist;From elevated surface       General transfer comment: max cuing for safe hand placement during sit<>stand  Ambulation/Gait Ambulation/Gait assistance: Min assist Gait Distance (Feet): 6 Feet (+ 4) Assistive device: Rolling walker (2 wheeled) Gait Pattern/deviations: Decreased step length - right;Decreased step length - left;Decreased dorsiflexion - right;Decreased dorsiflexion - left;Decreased stride length;Shuffle;Trunk flexed Gait velocity: significantly decreased   General Gait Details: decreased heel strike BLE, decreased foot clearance BLE  Stairs            Wheelchair Mobility    Modified Rankin (Stroke Patients Only)       Balance Overall balance assessment: Needs assistance Sitting-balance support: Feet supported;Bilateral upper extremity supported Sitting balance-Leahy Scale: Fair     Standing balance support: Bilateral upper extremity supported;During functional activity Standing balance-Leahy Scale: Poor Standing balance comment: posterior LOB with BUE support on RW requiring min/mod assist to correct  Pertinent Vitals/Pain Pain Assessment: 0-10 Pain Score: 8  Pain Location: R knee Pain Descriptors / Indicators: Sore Pain Intervention(s): Limited activity within patient's  tolerance;Monitored during session;Premedicated before session    Home Living Family/patient expects to be discharged to:: Private residence Living Arrangements: Spouse/significant other Available Help at Discharge: Family;Available 24 hours/day Type of Home: House Home Access: Ramped entrance     Home Layout: One level Home Equipment: Walker - 2 wheels;Kasandra Knudsen - single point      Prior Function Level of Independence: Independent with assistive device(s)         Comments: Pt reports she just discharged home from [place] on Friday morning when this fall happened Friday evening (thoough pt reports EMS dropped her vs her falling prior to their arrival). Pt feels she sat in her recliner for too long (sat for a "couple of hours"). Prior to initial fall in June pt was ambulatory with RW. Pt endorses 4 falls in the past 6 months.     Hand Dominance   Dominant Hand: Right    Extremity/Trunk Assessment   Upper Extremity Assessment Upper Extremity Assessment: Generalized weakness    Lower Extremity Assessment Lower Extremity Assessment: Generalized weakness (not formally tested, pt able to complete long arc quad through full ROM BLE)    Cervical / Trunk Assessment Cervical / Trunk Assessment: Kyphotic  Communication   Communication: No difficulties  Cognition Arousal/Alertness: Awake/alert Behavior During Therapy: WFL for tasks assessed/performed Overall Cognitive Status: Within Functional Limits for tasks assessed                                 General Comments: Pt discouraged re: need for STR again after just returning home from rehab the morning this event happened, PT attempts to provide encouragement.      General Comments      Exercises     Assessment/Plan    PT Assessment Patient needs continued PT services  PT Problem List Decreased strength;Decreased mobility;Decreased safety awareness;Decreased activity tolerance;Decreased balance;Decreased knowledge  of use of DME;Pain       PT Treatment Interventions DME instruction;Therapeutic activities;Modalities;Gait training;Therapeutic exercise;Patient/family education;Stair training;Balance training;Functional mobility training;Neuromuscular re-education;Manual techniques    PT Goals (Current goals can be found in the Care Plan section)  Acute Rehab PT Goals Patient Stated Goal: get better, go home PT Goal Formulation: With patient Time For Goal Achievement: 11/01/20 Potential to Achieve Goals: Fair    Frequency BID   Barriers to discharge Decreased caregiver support      Co-evaluation               AM-PAC PT "6 Clicks" Mobility  Outcome Measure Help needed turning from your back to your side while in a flat bed without using bedrails?: A Little Help needed moving from lying on your back to sitting on the side of a flat bed without using bedrails?: A Little Help needed moving to and from a bed to a chair (including a wheelchair)?: A Little Help needed standing up from a chair using your arms (e.g., wheelchair or bedside chair)?: A Little Help needed to walk in hospital room?: A Little Help needed climbing 3-5 steps with a railing? : Total 6 Click Score: 16    End of Session Equipment Utilized During Treatment: Gait belt Activity Tolerance: Patient tolerated treatment well Patient left: in chair;with call bell/phone within reach;with chair alarm set Nurse Communication: Mobility status PT Visit Diagnosis: Muscle weakness (  generalized) (M62.81);Difficulty in walking, not elsewhere classified (R26.2)    Time: 2376-2831 PT Time Calculation (min) (ACUTE ONLY): 22 min   Charges:   PT Evaluation $PT Eval Low Complexity: 1 Low PT Treatments $Therapeutic Activity: 8-22 mins        Lavone Nian, PT, DPT 10/18/20, 9:24 AM   Waunita Schooner 10/18/2020, 9:19 AM

## 2020-10-18 NOTE — NC FL2 (Signed)
[place] LEVEL OF CARE SCREENING TOOL     IDENTIFICATION  Patient Name: Traci Carter Birthdate: Oct 07, 1945 Sex: female Admission Date (Current Location): 10/17/2020  [facility] and [place] Number:  Engineering geologist and Address:  [facility], [address], [place], [place] [address]      Provider Number: [id number]  Attending Physician Name and Address:  Lavina Hamman, MD  Relative Name and Phone Number:  Siona Coulston (Spouse) [phone]    Current Level of Care: Hospital Recommended Level of Care: [place] Prior Approval Number:    Date Approved/Denied:   PASRR Number: [id number]  Discharge Plan: SNF    Current Diagnoses: Patient Active Problem List   Diagnosis Date Noted   Fracture of femoral condyle, right, closed ([place]) 10/17/2020   Generalized weakness 09/16/2020   Type II diabetes mellitus with renal manifestations ([place]) 09/16/2020   CKD (chronic kidney disease), stage IIIa 09/16/2020   Depression with anxiety 09/16/2020   Fall 09/16/2020   UTI (urinary tract infection) 09/16/2020   Seizure ([place]) 09/16/2020   Diabetes mellitus type 2, uncomplicated ([place]) 68/02/5725   Chest pain with low risk for cardiac etiology 12/27/2018   SOB (shortness of breath) on exertion 12/27/2018   Paroxysmal atrial fibrillation ([place]) 10/16/2018   Hyperlipidemia associated with type 2 diabetes mellitus ([place]) 10/16/2018   Pacemaker 10/16/2018   History of CVA (cerebrovascular accident) 10/16/2018   OSA (obstructive sleep apnea) 10/16/2018   Fibromyalgia 10/16/2018   CAD (coronary artery disease) 10/16/2018   Diarrhea 06/09/2017   Hyperlipemia 08/15/2013   ICH (intracerebral hemorrhage) ([place]) 07/21/2012   Diabetes ([place]) 07/21/2012   Essential hypertension 07/21/2012   CVA (cerebral vascular accident) ([place]) 07/20/2012    Orientation RESPIRATION BLADDER Height & Weight     Self, Time, Situation, Place  Normal  Continent Weight: 110.7 kg Height:  5\' 3"  (160 cm)  BEHAVIORAL SYMPTOMS/MOOD NEUROLOGICAL BOWEL NUTRITION STATUS      Continent Diet  AMBULATORY STATUS COMMUNICATION OF NEEDS Skin   Limited Assist Verbally Normal                       Personal Care Assistance Level of Assistance  Bathing, Dressing Bathing Assistance: Limited assistance   Dressing Assistance: Limited assistance     Functional Limitations Info             SPECIAL CARE FACTORS FREQUENCY  PT (By licensed PT), OT (By licensed OT), Diabetic urine testing   Diabetic Urine Testing Frequency: CBG before every meal and at HS while in acute care setting. PT Frequency: 5x/week OT Frequency: 5x/week            Contractures Contractures Info: Present    Additional Factors Info  Allergies   Allergies Info: Codeine, Lanoxin, Zoloft, Pencillins (PCN).           Current Medications (10/18/2020):  This is the current hospital active medication list Current Facility-Administered Medications  Medication Dose Route Frequency Provider Last Rate Last Admin   acetaminophen (TYLENOL) tablet 650 mg  650 mg Oral Q6H PRN Mansy, Jan A, MD       Or   acetaminophen (TYLENOL) suppository 650 mg  650 mg Rectal Q6H PRN Mansy, Jan A, MD       albuterol (PROVENTIL) (2.5 MG/3ML) 0.083% nebulizer solution 3 mL  3 mL Inhalation Q4H PRN Mansy, Jan A, MD       apixaban (ELIQUIS) tablet 5 mg  5 mg  Oral BID Lavina Hamman, MD   5 mg at 10/18/20 0802   atorvastatin (LIPITOR) tablet 20 mg  20 mg Oral Daily Mansy, Jan A, MD   20 mg at 10/18/20 0802   DULoxetine (CYMBALTA) DR capsule 60 mg  60 mg Oral Daily Mansy, Jan A, MD   60 mg at 10/18/20 0802   famotidine (PEPCID) tablet 20 mg  20 mg Oral Daily Mansy, Jan A, MD   20 mg at 10/18/20 0802   insulin aspart (novoLOG) injection 0-5 Units  0-5 Units Subcutaneous QHS Lavina Hamman, MD       insulin aspart (novoLOG) injection 0-9 Units  0-9 Units Subcutaneous TID WC Lavina Hamman, MD    5 Units at 10/18/20 1128   levETIRAcetam (KEPPRA) tablet 1,000 mg  1,000 mg Oral BID Mansy, Jan A, MD   1,000 mg at 10/18/20 0801   magnesium hydroxide (MILK OF MAGNESIA) suspension 30 mL  30 mL Oral Daily PRN Mansy, Jan A, MD       montelukast (SINGULAIR) tablet 10 mg  10 mg Oral QHS Mansy, Jan A, MD   10 mg at 10/17/20 2106   morphine 2 MG/ML injection 2-4 mg  2-4 mg Intravenous Q2H PRN Lavina Hamman, MD   2 mg at 10/18/20 0801   ondansetron (ZOFRAN) tablet 4 mg  4 mg Oral Q6H PRN Mansy, Jan A, MD       Or   ondansetron [facility]) injection 4 mg  4 mg Intravenous Q6H PRN Mansy, Jan A, MD       oxyCODONE-acetaminophen (PERCOCET/ROXICET) 5-325 MG per tablet 1 tablet  1 tablet Oral Q6H PRN Lavina Hamman, MD   1 tablet at 10/18/20 1128   polyethylene glycol (MIRALAX / GLYCOLAX) packet 34 g  34 g Oral Daily PRN Mansy, Jan A, MD       pregabalin (LYRICA) capsule 50 mg  50 mg Oral BID Mansy, Jan A, MD   50 mg at 10/18/20 0802   senna-docusate (Senokot-S) tablet 2 tablet  2 tablet Oral BID Mansy, Jan A, MD   2 tablet at 10/18/20 0802   traZODone (DESYREL) tablet 25 mg  25 mg Oral QHS PRN Mansy, Jan A, MD       zolpidem (AMBIEN) tablet 5 mg  5 mg Oral QHS PRN Mansy, Jan A, MD   5 mg at 10/17/20 2107     Discharge Medications: Please see discharge summary for a list of discharge medications.  Relevant Imaging Results:  Relevant Lab Results:   Additional Information    Izola Price, RN

## 2020-10-18 NOTE — Evaluation (Signed)
Occupational Therapy Evaluation Patient Details Name: Traci Carter MRN: [id number] DOB: 21-Jun-1945 Today's Date: 10/18/2020    History of Present Illness Pt is a 75 y/o F who presented to the ER with acute onset of BLE pain. Per pt, at home she tried to get out of the recliner when her knees buckled & she fell straight down on B knees. Right knee x-ray showed suspected medial femoral condyle fracture and right knee arthropathy.  Left knee showed left knee arthropathy without evidence of complications. Orthopedics was consulted. CT scan shows presence of a minimally displaced medial condyle fracture with callus formation that was present on the prior CT scan on 6/15. Knee arthroplasty appears stable & pt will be managed nonoperatively. PMH: DM2, HTN, CVA (intracranial hemorrhage, 2014), a-fib, fibromyalgia, depression, anxiety, gout   Clinical Impression   Pt seen for OT evaluation this date. Pt reports that prior to this admission, pt recently discharged from [place] following a fall in June. Pt endorses 3-4 falls in past 6 months. Pt reports that prior to fall in June, she was mod-independent with ADLs and functional mobility with RW; at baseline pt spongebathes while standing and is able to prepare simple meals. Pt currently requires MIN A for transfers to/from [facility], MIN GUARD for seated peri-care, and MIN A for functional mobility of short household distances (~66ft) with RW due to decreased activity tolerance, decreased strength, and decreased balance. Pt would benefit from additional skilled OT services to maximize return to PLOF and minimize risk of future falls, injury, caregiver burden, and readmission. Upon discharge, recommend SNF.     Follow Up Recommendations  SNF    Equipment Recommendations  Other (comment) (defer to next venue of care)       Precautions / Restrictions Precautions Precautions: Fall Restrictions Weight Bearing Restrictions: Yes RLE Weight Bearing: Weight bearing as  tolerated      Mobility Bed Mobility   General bed mobility comments: not assessed, pt in recliner at beginning/end of session    Transfers Overall transfer level: Needs assistance Equipment used: Rolling walker (2 wheeled) Transfers: Sit to/from Stand Sit to Stand: Min assist       General transfer comment: MIN A and VC for safe hand placement    Balance Overall balance assessment: Needs assistance Sitting-balance support: Feet supported;No upper extremity supported Sitting balance-Leahy Scale: Fair Sitting balance - Comments: fair sitting balance on BSC with b/l UE unsupported   Standing balance support: Bilateral upper extremity supported;During functional activity Standing balance-Leahy Scale: Poor Standing balance comment: MIN A d/t increasing fatigue during functional mobility of short distances                           ADL either performed or assessed with clinical judgement   ADL Overall ADL's : Needs assistance/impaired Eating/Feeding: Independent;Sitting   Grooming: Wash/dry hands;Set up;Sitting               Lower Body Dressing: Minimal assistance;Sitting/lateral leans Lower Body Dressing Details (indicate cue type and reason): to don socks. MIN to for LE placement to achieve figure-4 position Toilet Transfer: Minimal assistance;BSC;RW   Toileting- Clothing Manipulation and Hygiene: Min guard;Sitting/lateral lean Toileting - Clothing Manipulation Details (indicate cue type and reason): MIN GUARD for anterior & posterior peri-care via lateral lean on BSC     Functional mobility during ADLs: Minimal assistance;Rolling walker (to walk to/from [facility] (~71ft), with pt endorsing fatigue following mobility, transfer, and hygiene)  Vision Baseline Vision/History: Wears glasses Wears Glasses: At all times              Pertinent Vitals/Pain Pain Assessment: No/denies pain      Hand Dominance Right   Extremity/Trunk Assessment Upper  Extremity Assessment Upper Extremity Assessment: Generalized weakness;RUE deficits/detail RUE Deficits / Details: Limited shoulder flexion d/t previous rotator cuff injury. Able to flex ~80degrees. At least 3/4 in all other movements   Lower Extremity Assessment Lower Extremity Assessment: Generalized weakness   Cervical / Trunk Assessment Cervical / Trunk Assessment: Kyphotic   Communication Communication Communication: No difficulties   Cognition Arousal/Alertness: Awake/alert Behavior During Therapy: WFL for tasks assessed/performed Overall Cognitive Status: Within Functional Limits for tasks assessed                                  Exercises Other Exercises Other Exercises: education re role of OT, POC, and d/c recommendations        Home Living Family/patient expects to be discharged to:: Private residence Living Arrangements: Spouse/significant other Available Help at Discharge: Family;Available 24 hours/day Type of Home: House Home Access: Ramped entrance     Home Layout: One level;Laundry or work area in [employer] Shower/Tub: Other (comment) (spongebathes at baseline while standing at sink)         Home Equipment: Environmental consultant - 2 wheels;Cane - single point          Prior Functioning/Environment Level of Independence: Independent with assistive device(s)        Comments: Pt reports she just discharged home from [place] on Friday morning when this fall happened Friday evening (thoough pt reports EMS dropped her vs her falling prior to their arrival). Pt feels she sat in her recliner for too long (sat for a "couple of hours"). Prior to initial fall in June pt was ambulatory with RW. Pt endorses 3-4 falls in the past 6 months. Pt reports being independent with ADLs at baseline. Pt able to prepare simple meals        OT Problem List: Decreased strength;Decreased range of motion;Decreased activity tolerance;Impaired balance (sitting and/or  standing);Pain      OT Treatment/Interventions: Self-care/ADL training;Therapeutic exercise;Energy conservation;DME and/or AE instruction;Therapeutic activities;Patient/family education;Balance training    OT Goals(Current goals can be found in the care plan section) Acute Rehab OT Goals Patient Stated Goal: get better, go home OT Goal Formulation: With patient Time For Goal Achievement: 11/01/20 Potential to Achieve Goals: Fair ADL Goals Pt Will Perform Lower Body Dressing: with supervision;sitting/lateral leans Pt Will Transfer to Toilet: with supervision;ambulating;bedside commode Pt Will Perform Toileting - Clothing Manipulation and hygiene: with supervision;sitting/lateral leans  OT Frequency: Min 1X/week    AM-PAC OT "6 Clicks" Daily Activity     Outcome Measure Help from another person eating meals?: None Help from another person taking care of personal grooming?: A Little Help from another person toileting, which includes using toliet, bedpan, or urinal?: A Little Help from another person bathing (including washing, rinsing, drying)?: A Lot Help from another person to put on and taking off regular upper body clothing?: A Little Help from another person to put on and taking off regular lower body clothing?: A Lot 6 Click Score: 17   End of Session Equipment Utilized During Treatment: Gait belt;Rolling walker Nurse Communication: Mobility status  Activity Tolerance: Patient tolerated treatment well Patient left: in chair;with call bell/phone within reach;with chair alarm set  OT Visit Diagnosis: Unsteadiness on feet (R26.81);History of falling (Z91.81);Muscle weakness (generalized) (M62.81);Pain Pain - Right/Left: Right Pain - part of body: Knee                Time: 0922-0952 OT Time Calculation (min): 30 min Charges:  OT General Charges $OT Visit: 1 Visit OT Evaluation $OT Eval Moderate Complexity: 1 Mod OT Treatments $Self Care/Home Management : 8-22 mins  Fredirick Maudlin, OTR/L [place]

## 2020-10-18 NOTE — TOC Progression Note (Signed)
Transition of Care [facility]) - Progression Note    Patient Details  Name: Traci Carter MRN: [id number] Date of Birth: 1945/05/02  Transition of Care [facility]) CM/SW Contact  Izola Price, RN Phone Number: 10/18/2020, 2:05 PM  Clinical Narrative:  Spoke with patient regarding STR/SNF recommendations by PT/OT. Patient states she had just returned from [place] on Friday 7/15. She could not get out of her recliner after sitting for some time and eventually called EMS for help.  According to patient, EMS struggled to get patient out of chair as well. When she was up, she said she did fall onto her Right leg, it was bent under her, she stated. Today she feels she is back to where she was post discharge to home from [place]. She is amenable to going back to [facility] for a shorter term if possible or HH. She had a Education officer, museum who was working with her on setting up [facility] RN/OT, and getting some DME (Wheelchair, 3:1), patient has a walker. She could not recall name of [place] agency or SW, but will get her number and contact her tomorrow. If SNF is the way she goes, [facility] if preference and it is just a few blocks from her home. She does NOT want [facility]. Her husband is able to drive her and she has no issues getting her medications. PCP is 3 blocks from her home. Dr. Benita Stabile. She uses [facility] on [address], [place]. Simmie Davies RN CM         Expected Discharge Plan and Services                                                 Social Determinants of Health (SDOH) Interventions    Readmission Risk Interventions No flowsheet data found.

## 2020-10-19 LAB — GLUCOSE, CAPILLARY
Glucose-Capillary: 260 mg/dL — ABNORMAL HIGH (ref 70–99)
Glucose-Capillary: 375 mg/dL — ABNORMAL HIGH (ref 70–99)
Glucose-Capillary: 143 mg/dL — ABNORMAL HIGH (ref 70–99)
Glucose-Capillary: 138 mg/dL — ABNORMAL HIGH (ref 70–99)

## 2020-10-19 MED ORDER — POLYETHYLENE GLYCOL 3350 17 G PO PACK
17.0000 g | PACK | Freq: Two times a day (BID) | ORAL | Status: DC
  Administered 2020-10-19 (×2): 17 g via ORAL
  Filled 2020-10-19 (×2): qty 1

## 2020-10-19 MED ORDER — INSULIN ASPART 100 UNIT/ML IJ SOLN: 0.0000 [IU] | Freq: Every day | INTRAMUSCULAR | Status: DC

## 2020-10-19 MED ORDER — INSULIN GLARGINE 100 UNIT/ML ~~LOC~~ SOLN
6.0000 [IU] | Freq: Every day | SUBCUTANEOUS | Status: DC
  Administered 2020-10-19: 6 [IU] via SUBCUTANEOUS
  Filled 2020-10-19 (×2): qty 0.06

## 2020-10-19 MED ORDER — GLIPIZIDE 5 MG PO TABS
5.0000 mg | ORAL_TABLET | Freq: Every day | ORAL | Status: DC
  Filled 2020-10-19: qty 1

## 2020-10-19 MED ORDER — INSULIN ASPART 100 UNIT/ML IJ SOLN
0.0000 [IU] | Freq: Three times a day (TID) | INTRAMUSCULAR | Status: DC
  Administered 2020-10-19: 2 [IU] via SUBCUTANEOUS
  Administered 2020-10-20: 3 [IU] via SUBCUTANEOUS
  Administered 2020-10-20: 8 [IU] via SUBCUTANEOUS
  Filled 2020-10-19 (×3): qty 1

## 2020-10-19 MED ORDER — INSULIN ASPART 100 UNIT/ML IJ SOLN
4.0000 [IU] | Freq: Three times a day (TID) | INTRAMUSCULAR | Status: DC
  Administered 2020-10-19 – 2020-10-20 (×3): 4 [IU] via SUBCUTANEOUS
  Filled 2020-10-19 (×3): qty 1

## 2020-10-19 MED ORDER — METHOCARBAMOL 500 MG PO TABS
500.0000 mg | ORAL_TABLET | Freq: Three times a day (TID) | ORAL | Status: DC
  Administered 2020-10-19 – 2020-10-20 (×4): 500 mg via ORAL
  Filled 2020-10-19 (×4): qty 1

## 2020-10-19 MED ORDER — MAGNESIUM HYDROXIDE 400 MG/5ML PO SUSP
30.0000 mL | Freq: Every day | ORAL | Status: DC
  Administered 2020-10-19: 30 mL via ORAL
  Filled 2020-10-19: qty 30

## 2020-10-19 NOTE — Progress Notes (Signed)
[facility] Progress Note  Patient: Traci Carter    EQA:[id number]  DOA: 10/17/2020     Date of Service: the patient was seen and examined on 10/19/2020  Brief hospital course: Past medical history of type II DM, HTN, CVA, chronic A. fib presents with complaints of bilateral knee pain after a fall at home. Underwent CT scan which showed chronic fracture of knee with stable prosthetic hardware. Orthopedic recommends conservative management with pain control. Currently plan is arrange safe discharge at [facility].  Subjective: Pain remains well controlled.  No nausea no vomiting but no fever no chills. Later in the day patient actually had a fall which was witnessed without any head injury or back injury or neck injury. Patient denies any acute complaint after the fall on my therapy evaluation.  Assessment and Plan: 1.  Closed fracture of the right femoral medial condyle.   Presents with a fall. CT scan of right knee shows presence of minimally displaced medial condyle fracture which was present on CT scan on 6/15. Knee arthroplasty appears stable. Nonoperative management with pain control recommended. WBAT. ROM as tolerated. ROM knee brace recommended.  2.  Type 2 diabetes mellitus with peripheral neuropathy. - Continue current regimen of sliding scale insulin resume glipizide. - We will hold off metformin. - We will continue Lyrica.  4.  Essential hypertension. - Blood pressure is soft.  Holding blood pressure medication right now.  5.  Gout. - We will holding colchicine and allopurinol.  6.  Paroxysmal atrial fibrillation. - Resume Eliquis.  7.  GERD. - We will continue H2 blocker therapy.  8.  History of intracranial hemorrhage. - We will continue Keppra.  9.  Fibromyalgia and depression with anxiety. - We will continue Cymbalta   10.  Fall in the hospital. Currently no focal deficit. No acute complaint. Monitor. This was a mechanical event.  Scheduled Meds:   apixaban  5 mg Oral BID   atorvastatin  20 mg Oral Daily   DULoxetine  60 mg Oral Daily   famotidine  20 mg Oral Daily   [START ON 10/20/2020] glipiZIDE  5 mg Oral QAC breakfast   insulin aspart  0-15 Units Subcutaneous TID WC   insulin aspart  0-5 Units Subcutaneous QHS   insulin aspart  4 Units Subcutaneous TID WC   insulin glargine  6 Units Subcutaneous QHS   levETIRAcetam  1,000 mg Oral BID   magnesium hydroxide  30 mL Oral Daily   methocarbamol  500 mg Oral TID   montelukast  10 mg Oral QHS   polyethylene glycol  17 g Oral BID   pregabalin  50 mg Oral BID   senna-docusate  2 tablet Oral BID   Continuous Infusions: PRN Meds: acetaminophen **OR** acetaminophen, albuterol, magnesium hydroxide, morphine injection, ondansetron **OR** ondansetron (ZOFRAN) IV, oxyCODONE-acetaminophen, traZODone, zolpidem  Body mass index is 43.22 kg/m.        DVT Prophylaxis:   SCDs Start: 10/17/20 0544 apixaban (ELIQUIS) tablet 5 mg    Advance goals of care discussion: Pt is Full code.  Family Communication: no family was present at bedside, at the time of interview.   Data Reviewed: I have personally reviewed and interpreted daily labs, tele strips, imaging. Will repeat labs tomorrow.  CBG elevated.  Physical Exam:  General: Appear in mild distress, no Rash; Oral Mucosa Clear, moist. no Abnormal Neck Mass Or lumps, Conjunctiva normal  Cardiovascular: S1 and S2 Present, no Murmur, Respiratory: good respiratory effort, Bilateral Air entry present  and CTA, no Crackles, no wheezes Abdomen: Bowel Sound present, Soft and no tenderness Extremities: no Pedal edema Neurology: alert and oriented to time, place, and person affect appropriate. no new focal deficit Gait not checked due to patient safety concerns  Vitals:   10/18/20 2041 10/19/20 0816 10/19/20 1300 10/19/20 1541  BP: (!) 116/56 133/84 (!) 149/84 124/65  Pulse: 94 81 84 87  Resp: 17 15 15 16   Temp: 97.8 F (36.6 C) 98.1 F (36.7  C) 98.1 F (36.7 C) 98.2 F (36.8 C)  TempSrc: Oral  Oral   SpO2: 95% 99% 99% 100%  Weight:      Height:        Disposition:  Status is: Inpatient  Remains inpatient appropriate because:IV treatments appropriate due to intensity of illness or inability to take PO  Dispo: The patient is from: Home              Anticipated d/c is to: Home              Patient currently is not medically stable to d/c.   Difficult to place patient No  Time spent: 35 minutes. I reviewed all nursing notes, pharmacy notes, vitals, pertinent old records. I have discussed plan of care as described above with RN.  Author: Berle Mull, MD [facility] 10/19/2020 7:58 PM  To reach On-call, see care teams to locate the attending and reach out via [email or web address]. Between 7PM-7AM, please contact night-coverage If you still have difficulty reaching the attending provider, please page the [facility] (Director on Call) for [facility] on amion for assistance.

## 2020-10-19 NOTE — TOC Progression Note (Addendum)
Transition of Care [facility]) - Progression Note    Patient Details  Name: Traci Carter MRN: [id number] Date of Birth: 11-23-1945  Transition [facility]) CM/SW [place], RN Phone Number: 10/19/2020, 2:29 PM  Clinical Narrative:     Met with the patient to review the bed choice, I explained she has 6 days left before copay day start and then it is 188 per day, she stated understanding and agrees to [facility] bed offer to start insurance auth process, Ref number [id number].Marland Kitchen Notified [facility] at [facility] of the bed acceptance        Expected Discharge Plan and Services                                                 Social Determinants of Health (SDOH) Interventions    Readmission Risk Interventions No flowsheet data found.

## 2020-10-19 NOTE — Progress Notes (Signed)
Inpatient Diabetes Program Recommendations  AACE/ADA: New Consensus Statement on Inpatient Glycemic Control  Target Ranges:  Prepandial:   less than 140 mg/dL      [facility] postprandial:   less than 180 mg/dL (1-2 hours)      Critically ill patients:  140 - 180 mg/dL   Results for DEVANIE, GALANTI (MRN [id number]) as of 10/19/2020 12:09  Ref. Range 10/18/2020 07:20 10/18/2020 11:14 10/18/2020 16:38 10/18/2020 21:33 10/19/2020 08:17 10/19/2020 11:26  Glucose-Capillary Latest Ref Range: 70 - 99 mg/dL 245 (H) 264 (H) 209 (H) 269 (H) 260 (H) 375 (H)    Review of Glycemic Control  Diabetes history: DM2 Outpatient Diabetes medications: Novolog 0-9 units; was taking Glipizide 5 mg daily Current orders for Inpatient glycemic control: Novolog 0-9 units TID with meals, Novolog 0-5 units QHS  Inpatient Diabetes Program Recommendations:  Insulin: Please consider ordering Lantus 6 units Q24H and Novolog 4 units TID with meals for meal coverage if patient eats at least 50% of meals.  Outpatient DM medications: Noted patient was ordered Glipizide 5 mg daily which was stopped at last hospital discharge on 09/21/20 (per discharge summary by Dr. Waldron [facility], "Patient is glipizide 5 mg oral daily, I will hold it on discharge as she will remain on sliding scale at the facility." )   Glucose has ranged from 209-375 mg/dl over the past 28 hours. At time of discharge, will likely need to prescribe additional DM medication.  NOTE: Patient was last admitted 09/16/20-09/21/20 and had been taking Glipizide 5 mg daily as an outpatient. Per discharge summary on 09/21/20 by Dr. Waldron [facility], Glipizide was stopped and patient was ordered Novolog correction TID with meals since patient was discharging to a facility. Glucose on initial labs on 10/17/20, glucose 209 mg/dl. CBGs has ranged 209-375 mg/dl over the past 28 hours.    Thanks, Barnie Alderman, RN, MSN, CDE Diabetes Coordinator Inpatient Diabetes Program [phone] (Team Pager from  8am to 5pm)

## 2020-10-19 NOTE — Progress Notes (Signed)
   10/19/20 1300  What Happened  Was fall witnessed? Yes  Who witnessed fall? Monica Becton, LPN  Patients activity before fall other (comment) (BSC with rolling walker and assistance)  Point of contact buttocks  Was patient injured? No  Follow Up  MD notified Dr. Berle Mull  Time MD notified [phone]  Family notified No - patient refusal  Progress note created (see row info) Yes  Adult Fall Risk Assessment  Risk Factor Category (scoring not indicated) High fall risk per protocol (document High fall risk)  Patient Fall Risk Level High fall risk  Adult Fall Risk Interventions  Required Bundle Interventions *See Row Information* High fall risk - low, moderate, and high requirements implemented  Additional Interventions Use of appropriate toileting equipment (bedpan, BSC, etc.)  Screening for Fall Injury Risk (To be completed on HIGH fall risk patients) - Assessing Need for Floor Mats  Risk For Fall Injury- Criteria for Floor Mats Admitted as a result of a fall  Will Implement Floor Mats Yes  Vitals  Temp 98.1 F (36.7 C)  Temp Source Oral  BP (!) 149/84  MAP (mmHg) 103  BP Location Right Arm  BP Method Automatic  Patient Position (if appropriate) Lying  Pulse Rate 84  Pulse Rate Source Monitor  Resp 15  Oxygen Therapy  SpO2 99 %  O2 Device Room Air

## 2020-10-19 NOTE — Progress Notes (Signed)
    Quintella Reichert, LPN  Licensed Practical Nurse  Nursing  Progress Notes      Signed  Date of Service:  10/19/2020  1:52 PM           Signed                      10/19/20 1300  What Happened  Was fall witnessed? Yes  Who witnessed fall? Monica Becton, LPN  Patients activity before fall other (comment) (BSC with rolling walker and assistance)  Point of contact buttocks  Was patient injured? No  Follow Up  MD notified Dr. Berle Mull  Time MD notified [phone]  Family notified No - patient refusal  Progress note created (see row info) Yes  Adult Fall Risk Assessment  Risk Factor Category (scoring not indicated) High fall risk per protocol (document High fall risk)  Patient Fall Risk Level High fall risk  Adult Fall Risk Interventions  Required Bundle Interventions *See Row Information* High fall risk - low, moderate, and high requirements implemented  Additional Interventions Use of appropriate toileting equipment (bedpan, BSC, etc.)  Screening for Fall Injury Risk (To be completed on HIGH fall risk patients) - Assessing Need for Floor Mats  Risk For Fall Injury- Criteria for Floor Mats Admitted as a result of a fall  Will Implement Floor Mats Yes  Vitals  Temp 98.1 F (36.7 C)  Temp Source Oral  BP (!) 149/84  MAP (mmHg) 103  BP Location Right Arm  BP Method Automatic  Patient Position (if appropriate) Lying  Pulse Rate 84  Pulse Rate Source Monitor  Resp 15  Oxygen Therapy  SpO2 99 %  O2 Device Room Air              Note Details  Margaretmary Lombard, LPN File Time 10/17/9676  1:53 PM  Author Type Licensed Practical Nurse Status Signed  Last Editor Quintella Reichert, [place] # [phone] Admit Date 10/17/2020

## 2020-10-19 NOTE — Plan of Care (Signed)

## 2020-10-19 NOTE — TOC Progression Note (Signed)
Transition of Care [facility]) - Progression Note    Patient Details  Name: Traci Carter MRN: [id number] Date of Birth: 1945-05-29  Transition of Care [facility]) CM/SW [place], RN Phone Number: 10/19/2020, 1:29 PM  Clinical Narrative:    Reached out to the local facilities and requested that they make a bed offer, I noted [facility] is her preferred        Expected Discharge Plan and Services                                                 Social Determinants of Health (SDOH) Interventions    Readmission Risk Interventions No flowsheet data found.

## 2020-10-19 NOTE — Progress Notes (Signed)
Physical Therapy Treatment Patient Details Name: Traci Carter MRN: [id number] DOB: 15-Feb-1946 Today's Date: 10/19/2020    History of Present Illness Pt is a 76 y/o F who presented to the ER with acute onset of BLE pain. Per pt, at home she tried to get out of the recliner when her knees buckled & she fell straight down on B knees. Right knee x-ray showed suspected medial femoral condyle fracture and right knee arthropathy.  Left knee showed left knee arthropathy without evidence of complications. Orthopedics was consulted. CT scan shows presence of a minimally displaced medial condyle fracture with callus formation that was present on the prior CT scan on 6/15. Knee arthroplasty appears stable & pt will be managed nonoperatively. PMH: DM2, HTN, CVA (intracranial hemorrhage, 2014), a-fib, fibromyalgia, depression, anxiety, gout    PT Comments    Pt received supine in bed, somewhat skeptical to therapy 2/2 recent fall however agreeable. PT recommended exercises take place at EOB for pt safety and comfort. Pt performed STS x2 reps out of 4 total attempts. PT goal was 3 STS; on final attempt pt presented as fatigued and reported since the fall her had increased pain in her distal lateral right thigh. Pt returned to bed with heavy use of bed rails and total assist x2 to reposition towards HOB. Pt did become tearful reflecting on fall this afternoon stating she had "been doing so well today". PT provided encouragement and pt was in better spirits prior to PT leaving room. Plan to bring a +2 w/c follow tomorrow to assess ambulation distance and increase pt safety and comfort. Would benefit from skilled PT to address above deficits and promote optimal return to PLOF.    Follow Up Recommendations  SNF;Supervision/Assistance - 24 hour     Equipment Recommendations   (TBD in next venue)    Recommendations for Other Services       Precautions / Restrictions Precautions Precautions:  Fall Restrictions Weight Bearing Restrictions: Yes RLE Weight Bearing: Weight bearing as tolerated    Mobility  Bed Mobility Overal bed mobility: Needs Assistance Bed Mobility: Sit to Supine     Supine to sit: Supervision;HOB elevated Sit to supine: Supervision   General bed mobility comments: Unable to scoot EOB or reposition in bed. Total assist x 2 to reposition towards [name]    Transfers Overall transfer level: Needs assistance Equipment used: Rolling walker (2 wheeled) Transfers: Sit to/from Stand Sit to Stand: Min assist         General transfer comment: MIN A to stand from elevated bed. VC on technique, optimal hand placement and to return to sitting when trial/attempt is not successful.  Ambulation/Gait Ambulation/Gait assistance: Min assist Gait Distance (Feet): 6 Feet (79ft - 42ft - 41ft) Assistive device: Rolling walker (2 wheeled) Gait Pattern/deviations: Decreased step length - right;Decreased step length - left;Decreased dorsiflexion - right;Decreased dorsiflexion - left;Decreased stride length;Shuffle;Trunk flexed;Decreased stance time - right Gait velocity: significantly decreased   General Gait Details: Not attempted this session due to fall earlier this afternoon. Will attempt tomorrow with +2 for safety.   Stairs             Wheelchair Mobility    Modified Rankin (Stroke Patients Only)       Balance Overall balance assessment: Needs assistance Sitting-balance support: Feet supported;No upper extremity supported Sitting balance-Leahy Scale: Fair     Standing balance support: Bilateral upper extremity supported;During functional activity Standing balance-Leahy Scale: Fair Standing balance comment: MIN A d/t increasing fatigue  during functional mobility of short distances                            Cognition Arousal/Alertness: Awake/alert Behavior During Therapy: WFL for tasks assessed/performed Overall Cognitive Status: Within  Functional Limits for tasks assessed                                 General Comments: Pt became tearful at end of session as she reflected on falling earlier this afternoon during toilet transfer. PT provided reassurance and encouragement.      Exercises Other Exercises Other Exercises: bed>chair<>BSC transfers; total of 3 STS with VC on hand placement and MOD A to lift from lower surfaces. Toileting performed with independent pericare. Other Exercises: Seated marches, LAQ and glute sets 1 x 10 reps BLE each. Other Exercises: STS: 4 trials, 2 successful reps with 15 seconds of standing each rep.    General Comments        Pertinent Vitals/Pain Pain Assessment: 0-10 Pain Score: 10-Worst pain ever Pain Location: R knee Pain Descriptors / Indicators: Aching;Sore Pain Intervention(s): Limited activity within patient's tolerance;Monitored during session    Home Living                      Prior Function            PT Goals (current goals can now be found in the care plan section) Acute Rehab PT Goals Patient Stated Goal: get better, go home PT Goal Formulation: With patient Time For Goal Achievement: 11/01/20 Potential to Achieve Goals: Fair    Frequency    BID      PT Plan      Co-evaluation              AM-PAC PT "6 Clicks" Mobility   Outcome Measure  Help needed turning from your back to your side while in a flat bed without using bedrails?: A Little Help needed moving from lying on your back to sitting on the side of a flat bed without using bedrails?: A Little Help needed moving to and from a bed to a chair (including a wheelchair)?: A Little Help needed standing up from a chair using your arms (e.g., wheelchair or bedside chair)?: A Little Help needed to walk in hospital room?: A Little Help needed climbing 3-5 steps with a railing? : Total 6 Click Score: 16    End of Session Equipment Utilized During Treatment: Gait  belt Activity Tolerance: Patient limited by lethargy;Patient limited by fatigue;Patient limited by pain Patient left: with call bell/phone within reach;in bed;with bed alarm set Nurse Communication: Mobility status PT Visit Diagnosis: Muscle weakness (generalized) (M62.81);Difficulty in walking, not elsewhere classified (R26.2)     Time: 1500-1520 PT Time Calculation (min) (ACUTE ONLY): 20 min  Charges:  $Gait Training: 8-22 mins $Therapeutic Activity: 8-22 mins                     Patrina Levering PT, DPT 10/19/20 3:59 PM [phone]    [facility] Lab 10/19/2020, 3:53 PM

## 2020-10-19 NOTE — Progress Notes (Signed)
Occupational Therapy Treatment Patient Details Name: Traci Carter MRN: [id number] DOB: 05-26-1945 Today's Date: 10/19/2020    History of present illness Pt is a 75 y/o F who presented to the ER with acute onset of BLE pain. Per pt, at home she tried to get out of the recliner when her knees buckled & she fell straight down on B knees. Right knee x-ray showed suspected medial femoral condyle fracture and right knee arthropathy.  Left knee showed left knee arthropathy without evidence of complications. Orthopedics was consulted. CT scan shows presence of a minimally displaced medial condyle fracture with callus formation that was present on the prior CT scan on 6/15. Knee arthroplasty appears stable & pt will be managed nonoperatively. PMH: DM2, HTN, CVA (intracranial hemorrhage, 2014), a-fib, fibromyalgia, depression, anxiety, gout   OT comments  Ms Delatorre was seen for OT treatment on this date. Upon arrival to room pt reclinedin chair reporting anxiousness with sitting too long, eager to return to bed. Pt agreeable to seated therex, completed BLE therex as described below and 10 reps IS (provided) demonstrating good understanding of HEP. Pt requires MIN A + RW for ADL t/f and tolerates ~6 ft mobility.  Fair sitting balance static/dynamic sitting reaching inside BOS.   Pt with numerous questions r/t caregiver burnout, d/c recs, and safety - extensive education provided and all questions answered. Pt states only husband is available to assist at home and he is unable to provide physical assist 2/2 his age/baseline dementia. Pt demonstrates fair safety awareness but continues to require MOD cues. Pt reports 8/10 R knee pain limiting mobility - RN notified. Pt agreeable to d/c purewick for OOB to [facility] toileting - NT notified. Pt making good progress toward goals. Pt continues to benefit from skilled OT services to maximize return to PLOF and minimize risk of future falls, injury, caregiver burden, and  readmission. Will continue to follow POC. Discharge recommendation remains appropriate.    Follow Up Recommendations  SNF    Equipment Recommendations  None recommended by OT    Recommendations for Other Services      Precautions / Restrictions Precautions Precautions: Fall Restrictions Weight Bearing Restrictions: Yes RLE Weight Bearing: Weight bearing as tolerated       Mobility Bed Mobility Overal bed mobility: Needs Assistance Bed Mobility: Sit to Supine       Sit to supine: Supervision        Transfers Overall transfer level: Needs assistance Equipment used: Rolling walker (2 wheeled) Transfers: Sit to/from Stand Sit to Stand: Min assist              Balance Overall balance assessment: Needs assistance Sitting-balance support: Feet supported;No upper extremity supported Sitting balance-Leahy Scale: Fair     Standing balance support: Bilateral upper extremity supported;During functional activity Standing balance-Leahy Scale: Fair                             ADL either performed or assessed with clinical judgement   ADL Overall ADL's : Needs assistance/impaired                                       General ADL Comments: MIN A + RW for ADL t/f. Fair sitting balance static/dynamic sitting reaching inside BOS.      Cognition Arousal/Alertness: Awake/alert Behavior During Therapy: WFL for tasks assessed/performed Overall  Cognitive Status: Within Functional Limits for tasks assessed                                          Exercises Exercises: Other exercises;General Lower Extremity General Exercises - Lower Extremity Gluteal Sets: AROM;Strengthening;Both;15 reps;Seated Long Arc Quad: AROM;Strengthening;Both;15 reps;Seated Hip ABduction/ADduction: AROM;Strengthening;Both;15 reps;Seated Hip Flexion/Marching: AROM;Strengthening;Both;15 reps;Seated Other Exercises Other Exercises: Pt educated re; OT  role, DME recs, d/c recs, falls prevention, home/routines modifications, caregiver burnout, toileting shcedule, HEP, and IS Other Exercises: sit<>stand, sit>sup, sitting/standing balance/tolerance, ~6 ft mobility, IS (10 reps)           Pertinent Vitals/ Pain       Pain Assessment: 0-10 Pain Score: 8  Pain Location: R knee Pain Descriptors / Indicators: Sore Pain Intervention(s): Limited activity within patient's tolerance;Repositioned;Patient requesting pain meds-RN notified         Frequency  Min 1X/week        Progress Toward Goals  OT Goals(current goals can now be found in the care plan section)  Progress towards OT goals: Progressing toward goals  Acute Rehab OT Goals Patient Stated Goal: get better, go home OT Goal Formulation: With patient Time For Goal Achievement: 11/01/20 Potential to Achieve Goals: Fair ADL Goals Pt Will Perform Lower Body Dressing: with supervision;sitting/lateral leans Pt Will Transfer to Toilet: with supervision;ambulating;bedside commode Pt Will Perform Toileting - Clothing Manipulation and hygiene: with supervision;sitting/lateral leans  Plan Discharge plan remains appropriate;Frequency remains appropriate       AM-PAC OT "6 Clicks" Daily Activity     Outcome Measure   Help from another person eating meals?: None Help from another person taking care of personal grooming?: A Little Help from another person toileting, which includes using toliet, bedpan, or urinal?: A Little Help from another person bathing (including washing, rinsing, drying)?: A Lot Help from another person to put on and taking off regular upper body clothing?: A Little Help from another person to put on and taking off regular lower body clothing?: A Lot 6 Click Score: 17    End of Session Equipment Utilized During Treatment: Rolling walker  OT Visit Diagnosis: Unsteadiness on feet (R26.81);History of falling (Z91.81);Muscle weakness (generalized)  (M62.81);Pain Pain - Right/Left: Right Pain - part of body: Knee   Activity Tolerance Patient tolerated treatment well   Patient Left in bed;with call bell/phone within reach;with bed alarm set   Nurse Communication Mobility status;Patient requests pain meds        Time: 1050-1119 OT Time Calculation (min): 29 min  Charges: OT General Charges $OT Visit: 1 Visit OT Treatments $Self Care/Home Management : 8-22 mins $Therapeutic Exercise: 8-22 mins  Dessie Coma, M.S. OTR/L  10/19/20, 11:46 AM  ascom [phone]

## 2020-10-19 NOTE — Progress Notes (Signed)
Physical Therapy Treatment Patient Details Name: BRELEIGH CARPINO MRN: [id number] DOB: [birth year] Today's Date: 10/19/2020    History of Present Illness Pt is a 75 y/o F who presented to the ER with acute onset of BLE pain. Per pt, at home she tried to get out of the recliner when her knees buckled & she fell straight down on B knees. Right knee x-ray showed suspected medial femoral condyle fracture and right knee arthropathy.  Left knee showed left knee arthropathy without evidence of complications. Orthopedics was consulted. CT scan shows presence of a minimally displaced medial condyle fracture with callus formation that was present on the prior CT scan on 6/15. Knee arthroplasty appears stable & pt will be managed nonoperatively. PMH: DM2, HTN, CVA (intracranial hemorrhage, 2014), a-fib, fibromyalgia, depression, anxiety, gout    PT Comments    Pt supine in bed upon arrival. Reports 10/10 pain with pain meds administered 1 hour prior to session; pt agreeable to therapy. Pt performed 3 ambulatory transfers today from bed>chair and chair<>BSC with ambulation distance ranging from 4-71ft. Pt reported increase in R knee pain upon completion of 3rd set of ambulation. She had difficulty standing from standard height surfaces requiring MOD A to lift. MIN A during ambulation to assist with managing RW and steady pt. Would benefit from skilled PT to address above deficits and promote optimal return to PLOF.   Follow Up Recommendations  SNF;Supervision/Assistance - 24 hour     Equipment Recommendations   (TBD in next venue)    Recommendations for Other Services       Precautions / Restrictions Precautions Precautions: Fall Restrictions Weight Bearing Restrictions: Yes RLE Weight Bearing: Weight bearing as tolerated    Mobility  Bed Mobility Overal bed mobility: Needs Assistance Bed Mobility: Supine to Sit     Supine to sit: Supervision;HOB elevated Sit to supine: Supervision   General  bed mobility comments: heavy use of bed rails    Transfers Overall transfer level: Needs assistance Equipment used: Rolling walker (2 wheeled) Transfers: Sit to/from Stand Sit to Stand: Mod assist         General transfer comment: MIN A to stand from elevated bed; MOD A to lift when standing from lower surace and VC for optimal hand placement  Ambulation/Gait Ambulation/Gait assistance: Min assist Gait Distance (Feet): 6 Feet (49ft - 55ft - 24ft) Assistive device: Rolling walker (2 wheeled) Gait Pattern/deviations: Decreased step length - right;Decreased step length - left;Decreased dorsiflexion - right;Decreased dorsiflexion - left;Decreased stride length;Shuffle;Trunk flexed;Decreased stance time - right Gait velocity: significantly decreased   General Gait Details: decreased heel strike BLE, decreased foot clearance BLE   Stairs             Wheelchair Mobility    Modified Rankin (Stroke Patients Only)       Balance Overall balance assessment: Needs assistance Sitting-balance support: Feet supported;No upper extremity supported Sitting balance-Leahy Scale: Fair     Standing balance support: Bilateral upper extremity supported;During functional activity Standing balance-Leahy Scale: Fair Standing balance comment: MIN A d/t increasing fatigue during functional mobility of short distances                            Cognition Arousal/Alertness: Awake/alert Behavior During Therapy: WFL for tasks assessed/performed Overall Cognitive Status: Within Functional Limits for tasks assessed  Exercises General Exercises - Lower Extremity Gluteal Sets: AROM;Strengthening;Both;15 reps;Seated Long Arc Quad: AROM;Strengthening;Both;15 reps;Seated Hip ABduction/ADduction: AROM;Strengthening;Both;15 reps;Seated Hip Flexion/Marching: AROM;Strengthening;Both;15 reps;Seated Other Exercises Other Exercises: Pt  educated re; OT role, DME recs, d/c recs, falls prevention, home/routines modifications, caregiver burnout, toileting shcedule, HEP, and IS Other Exercises: sit<>stand, sit>sup, sitting/standing balance/tolerance, ~6 ft mobility, IS (10 reps) Other Exercises: bed>chair<>BSC transfers; total of 3 STS with VC on hand placement and MOD A to lift from lower surfaces. Toileting performed with independent pericare. Other Exercises: Seated marches, LAQ and glute sets 1 x 10 reps BLE each.    General Comments        Pertinent Vitals/Pain Pain Assessment: 0-10 Pain Score: 10-Worst pain ever Pain Location: R knee Pain Descriptors / Indicators: Aching;Sore Pain Intervention(s): Limited activity within patient's tolerance;Patient requesting pain meds-RN notified;Monitored during session;Premedicated before session    Home Living                      Prior Function            PT Goals (current goals can now be found in the care plan section) Acute Rehab PT Goals Patient Stated Goal: get better, go home    Frequency    BID      PT Plan      Co-evaluation              AM-PAC PT "6 Clicks" Mobility   Outcome Measure  Help needed turning from your back to your side while in a flat bed without using bedrails?: A Little Help needed moving from lying on your back to sitting on the side of a flat bed without using bedrails?: A Little Help needed moving to and from a bed to a chair (including a wheelchair)?: A Little Help needed standing up from a chair using your arms (e.g., wheelchair or bedside chair)?: A Little Help needed to walk in hospital room?: A Little Help needed climbing 3-5 steps with a railing? : Total 6 Click Score: 16    End of Session Equipment Utilized During Treatment: Gait belt Activity Tolerance: Patient tolerated treatment well Patient left: in chair;with call bell/phone within reach;with chair alarm set Nurse Communication: Mobility status PT  Visit Diagnosis: Muscle weakness (generalized) (M62.81);Difficulty in walking, not elsewhere classified (R26.2)     Time: 0935-1001 PT Time Calculation (min) (ACUTE ONLY): 26 min  Charges:  $Gait Training: 8-22 mins $Therapeutic Activity: 8-22 mins                     Patrina Levering PT, DPT 10/19/20 12:48 PM [phone]    Ramonita Lab 10/19/2020, 12:43 PM

## 2020-10-20 LAB — BASIC METABOLIC PANEL
Anion gap: 9 (ref 5–15)
BUN: 15 mg/dL (ref 8–23)
CO2: 30 mmol/L (ref 22–32)
Calcium: 9.1 mg/dL (ref 8.9–10.3)
Chloride: 101 mmol/L (ref 98–111)
Creatinine, Ser: 0.85 mg/dL (ref 0.44–1.00)
GFR, Estimated: >60 mL/min (ref >60)
Glucose, Bld: 214 mg/dL — ABNORMAL HIGH (ref 70–99)
Potassium: 4.6 mmol/L (ref 3.5–5.1)
Sodium: 140 mmol/L (ref 135–145)

## 2020-10-20 LAB — CBC
HCT: 38.6 % (ref 36.0–46.0)
Hemoglobin: 12.8 g/dL (ref 12.0–15.0)
MCH: 30.6 pg (ref 26.0–34.0)
MCHC: 33.2 g/dL (ref 30.0–36.0)
MCV: 92.3 fL (ref 80.0–100.0)
Platelets: 123 10*3/uL — ABNORMAL LOW (ref 150–400)
RBC: 4.18 MIL/uL (ref 3.87–5.11)
RDW: 14.5 % (ref 11.5–15.5)
WBC: 5.6 10*3/uL (ref 4.0–10.5)
nRBC: 0 % (ref 0.0–0.2)

## 2020-10-20 LAB — RESP PANEL BY RT-PCR (FLU A&B, COVID) ARPGX2
Influenza A by PCR: NEGATIVE
Influenza B by PCR: NEGATIVE
SARS Coronavirus 2 by RT PCR: NEGATIVE

## 2020-10-20 LAB — GLUCOSE, CAPILLARY
Glucose-Capillary: 194 mg/dL — ABNORMAL HIGH (ref 70–99)
Glucose-Capillary: 267 mg/dL — ABNORMAL HIGH (ref 70–99)

## 2020-10-20 MED ORDER — OXYCODONE-ACETAMINOPHEN 5-325 MG PO TABS: 1.0000 | ORAL_TABLET | Freq: Four times a day (QID) | ORAL | 0 refills | Status: DC | PRN

## 2020-10-20 MED ORDER — METHOCARBAMOL 500 MG PO TABS: 500.0000 mg | ORAL_TABLET | Freq: Three times a day (TID) | ORAL | 0 refills | Status: DC

## 2020-10-20 MED ORDER — POLYETHYLENE GLYCOL 3350 17 G PO PACK: 17.0000 g | PACK | Freq: Two times a day (BID) | ORAL | 0 refills | Status: DC

## 2020-10-20 MED ORDER — PREGABALIN 50 MG PO CAPS: 50.0000 mg | ORAL_CAPSULE | Freq: Two times a day (BID) | ORAL | 0 refills | Status: DC

## 2020-10-20 MED ORDER — FUROSEMIDE 20 MG PO TABS: 20.0000 mg | ORAL_TABLET | Freq: Every day | ORAL | 11 refills | Status: DC | PRN

## 2020-10-20 MED ORDER — ZOLPIDEM TARTRATE 5 MG PO TABS: 5.0000 mg | ORAL_TABLET | Freq: Every evening | ORAL | 0 refills | Status: DC | PRN

## 2020-10-20 MED ORDER — METFORMIN HCL 500 MG PO TABS: 500.0000 mg | ORAL_TABLET | Freq: Two times a day (BID) | ORAL | 11 refills | Status: DC

## 2020-10-20 MED ORDER — DOCUSATE SODIUM 100 MG PO CAPS: 100.0000 mg | ORAL_CAPSULE | Freq: Two times a day (BID) | ORAL | 2 refills | Status: AC

## 2020-10-20 NOTE — TOC Progression Note (Signed)
Transition of Care [facility]) - Progression Note    Patient Details  Name: Traci Carter MRN: [id number] Date of Birth: 01-12-1946  [facility]) CM/SW Contact  Su Hilt, RN Phone Number: 10/20/2020, 10:16 AM  Clinical Narrative:    Received notification from [facility] health with approval for [facility], [id number] start date 7/19, next review 7/22, notified Tammy at [facility]        Expected Discharge Plan and Services                                                 Social Determinants of Health (SDOH) Interventions    Readmission Risk Interventions No flowsheet data found.

## 2020-10-20 NOTE — Plan of Care (Signed)

## 2020-10-20 NOTE — Progress Notes (Signed)
Patient discharged to [facility].  Report called to Wendy Poet, RN.  All questions answered.  PIV x 2 removed, no bleeding, intact.  Patient verbalized understanding of signs and symptoms of infection.  Patient satisfied with overall care at [facility].  All care transferred.

## 2020-10-20 NOTE — TOC Progression Note (Signed)
Transition of Care [facility]) - Progression Note    Patient Details  Name: Traci Carter MRN: [id number] Date of Birth: 14-May-1945  Transition of [facility]) CM/SW [place], RN Phone Number: 10/20/2020, 1:43 PM  Clinical Narrative:    Met with the patient and her husband and grand daughter in the room, she is aware that [facility] will pick her up at 40        Expected Discharge Plan and Services           Expected Discharge Date: 10/20/20                                     Social Determinants of Health (SDOH) Interventions    Readmission Risk Interventions No flowsheet data found.

## 2020-10-20 NOTE — Care Management Important Message (Signed)
Important Message  Patient Details  Name: Traci Carter MRN: [id number] Date of Birth: 21-Oct-1945   Medicare Important Message Given:  Yes     [name] 10/20/2020, 12:03 PM

## 2020-10-20 NOTE — Discharge Summary (Addendum)
[facility] Discharge Summary   Patient: Traci Carter YNW:[id number]  PCP: Albina Billet, MD  Date of admission: 10/17/2020   Date of discharge:  10/20/2020     Discharge Diagnoses:  Principal diagnosis   Fracture of femoral condyle, right, closed [facility])   Admitted From: home Disposition:  SNF   Recommendations for Outpatient Follow-up:  PCP: follow up in 1 week   Contact information for follow-up providers     Albina Billet, MD. Schedule an appointment as soon as possible for a visit in 1 week(s).   Specialty: Internal Medicine Contact information: [address]   [place] [address] [phone]         Renee Harder, MD. Schedule an appointment as soon as possible for a visit in 2 week(s).   Specialty: Orthopedic Surgery Contact information: [place] [address] [phone]              Contact information for after-discharge care     Destination     [facility] Preferred SNF .   Service: Skilled Nursing Contact information: [address] [place] [phone]                    Discharge Instructions     Diet - low sodium heart healthy   Complete by: As directed    Increase activity slowly   Complete by: As directed        Diet recommendation: Carb modified diet  Activity: The patient is advised to gradually reintroduce usual activities, as tolerated  Discharge Condition: stable  Code Status: Full code   History of present illness: As per the H and P dictated on admission, "Traci Carter is a 75 y.o. female with medical history significant for  type 2 diabetes mellitus, hypertension, CVA, atrial fibrillation, presented to the emergency room with acute onset of bilateral lower extremity pain described as aching and throbbing in graded 8/10 in severity.  Started before coming to the ER and encouraged when she tries to get out of her recliner.  While  doing that she stated that her knees buckled underneath her and she fell straight down on both knees.  Later on any walking or movement of her knee or ankle worsened her lower extremity pain.  Pain improved with rest.  No presyncope or syncope.  No paresthesias or focal muscle weakness.  She denies any head injury with her mechanical fall.  No chest pain or dyspnea or palpitations.  No cough or wheezing or dyspnea.  No dysuria, oliguria or hematuria or flank pain.  ED Course: When she came to the ER vital signs were within normal.  Labs revealed blood glucose of 209 and potassium of 3.6.  CBC was within normal except for platelets of 142.  Respiratory panel is currently pending. EKG as reviewed by me : EKG is currently pending.   Imaging: Right knee x-ray showed suspected medial femoral condyle fracture and right knee arthropathy.  Left knee showed left knee arthropathy without evidence of complications. Right and left tib-fib x-ray came back negative.  Right and left ankle x-ray came back negative. Left foot x-ray showed subtle lucency involving the proximal shaft of the fifth proximal phalanx.  Right foot x-ray showed erosive arthropathy involving the first MTP joint which could be related to gout with no fracture or dislocation.  Dr. Sharlet Salina was notified about the patient.  Patient was placed in knee immobilizer.  She will be admitted to a med-surgical bed for further evaluation and management. "  Hospital Course:  Summary of her active problems in the hospital is as following. 1.  Closed fracture of the right femoral medial condyle.   Presents with a fall. CT scan of right knee shows presence of minimally displaced medial condyle fracture which was present on CT scan on 6/15. Knee arthroplasty appears stable. Nonoperative management with pain control recommended. WBAT. ROM as tolerated. ROM knee brace recommended.  2.  Type 2 diabetes mellitus with peripheral neuropathy. - Continue  current regimen of sliding scale insulin. Continue metformin and Lyrica.  4.  Essential hypertension. - Blood pressure is soft.  Holding blood pressure medication right now.  5.  Gout. - We will continue colchicine and allopurinol.  6.  Paroxysmal atrial fibrillation. - Resume Eliquis.  7.  GERD. - We will continue H2 blocker therapy.  8.  History of intracranial hemorrhage. - We will continue Keppra.  9.  Fibromyalgia and depression with anxiety. - We will continue Cymbalta    10.  Fall in the hospital. Currently no focal deficit. No acute complaint. Monitor. This was a mechanical event.  11. Morbid obesity. Placing patient at high risk for poor outcome.  Body mass index is 43.22 kg/m.    Nutrition Interventions:        Pain control  - [place] Controlled Substance Reporting System database was reviewed. - 5 day supply was provided. - Patient was instructed, not to drive, operate heavy machinery, perform activities at heights, swimming or participation in water activities or provide baby sitting services while on Pain, Sleep and Anxiety Medications; until her outpatient Physician has advised to do so again.  - Also recommended to not to take more than prescribed Pain, Sleep and Anxiety Medications.  Patient was seen by physical therapy, who recommended SNF. On the day of the discharge the patient's vitals were stable, and no other new acute medical condition were reported. The patient was felt safe to be discharge at SNF with Therapy.  Consultants: Orthopedics  Procedures: none  DISCHARGE MEDICATION: Allergies as of 10/20/2020       Reactions   Codeine Other (See Comments)   GI Distress   Lanoxin [digoxin]    Zoloft [sertraline Hcl]    Penicillins Rash        Medication List     TAKE these medications    acetaminophen 325 MG tablet Commonly known as: TYLENOL Take 2 tablets (650 mg total) by mouth every 6 (six) hours as needed for mild pain,  headache, fever or moderate pain.   albuterol (2.5 MG/3ML) 0.083% nebulizer solution Commonly known as: PROVENTIL Inhale 3 mLs into the lungs every 4 (four) hours as needed for wheezing or shortness of breath.   apixaban 5 MG Tabs tablet Commonly known as: ELIQUIS Take 1 tablet (5 mg total) by mouth 2 (two) times daily.   atorvastatin 20 MG tablet Commonly known as: LIPITOR Take 20 mg by mouth daily.   colchicine 0.6 MG tablet Take 1 tablet (0.6 mg total) by mouth daily as needed (gout).   docusate sodium 100 MG capsule Commonly known as: Colace Take 1 capsule (100 mg total) by mouth 2 (two) times daily.   DULoxetine 60 MG capsule Commonly known as: CYMBALTA Take 60 mg by mouth daily.   furosemide 20 MG tablet Commonly known as: LASIX Take 1 tablet (20 mg total) by mouth daily as needed (weight gain >3 lbs in 1  day and >5 lbs in 2 days). What changed:  when to take this reasons to take this   insulin aspart 100 UNIT/ML injection Commonly known as: novoLOG Inject 0-9 Units into the skin 3 (three) times daily with meals.   levETIRAcetam 1000 MG tablet Commonly known as: KEPPRA Take 1 tablet (1,000 mg total) by mouth 2 (two) times daily.   lisinopril 5 MG tablet Commonly known as: ZESTRIL Take 1 tablet (5 mg total) by mouth daily.   metFORMIN 500 MG tablet Commonly known as: Glucophage Take 1 tablet (500 mg total) by mouth 2 (two) times daily with a meal.   methocarbamol 500 MG tablet Commonly known as: ROBAXIN Take 1 tablet (500 mg total) by mouth 3 (three) times daily.   montelukast 10 MG tablet Commonly known as: SINGULAIR Take 10 mg by mouth at bedtime.   oxyCODONE-acetaminophen 5-325 MG tablet Commonly known as: PERCOCET/ROXICET Take 1 tablet by mouth every 6 (six) hours as needed for moderate pain.   polyethylene glycol 17 g packet Commonly known as: MIRALAX / GLYCOLAX Take 17 g by mouth 2 (two) times daily. What changed:  how much to take when to  take this reasons to take this   pregabalin 50 MG capsule Commonly known as: Lyrica Take 1 capsule (50 mg total) by mouth 2 (two) times daily.   zolpidem 5 MG tablet Commonly known as: AMBIEN Take 1 tablet (5 mg total) by mouth at bedtime as needed for sleep.        Discharge Exam: Filed Weights   10/16/20 2203  Weight: 110.7 kg   Vitals:   10/20/20 0745 10/20/20 1142  BP: 128/89 113/67  Pulse: 69 73  Resp: 16 16  Temp: 98.3 F (36.8 C) 98 F (36.7 C)  SpO2: 98% 95%   General: Appear in mild distress, no Rash; Oral Mucosa Clear, moist. no Abnormal Neck Mass Or lumps, Conjunctiva normal  Cardiovascular: S1 and S2 Present, no Murmur, Respiratory: good respiratory effort, Bilateral Air entry present and CTA, no Crackles, no wheezes Abdomen: Bowel Sound present, Soft and no tenderness Extremities: no Pedal edema Neurology: alert and oriented to time, place, and person affect appropriate. no new focal deficit Gait not checked due to patient safety concerns    The results of significant diagnostics from this hospitalization (including imaging, microbiology, ancillary and laboratory) are listed below for reference.    Significant Diagnostic Studies: DG Tibia/Fibula Left  Result Date: 10/17/2020 CLINICAL DATA:  Fall, bilateral leg pain EXAM: LEFT TIBIA AND FIBULA - 2 VIEW COMPARISON:  None. FINDINGS: No fracture or dislocation is seen. The joint spaces are preserved. The visualized soft tissues are unremarkable. IMPRESSION: Negative. Electronically Signed   By: Julian Hy M.D.   On: 10/17/2020 03:33   DG Tibia/Fibula Right  Result Date: 10/17/2020 CLINICAL DATA:  Fall, bilateral leg pain EXAM: RIGHT TIBIA AND FIBULA - 2 VIEW COMPARISON:  None. FINDINGS: No fracture or dislocation is seen. The joint spaces are preserved. Visualized soft tissues are within normal limits. IMPRESSION: Negative. Electronically Signed   By: Julian Hy M.D.   On: 10/17/2020 03:34   DG  Ankle Complete Left  Result Date: 10/17/2020 CLINICAL DATA:  Fall, bilateral leg pain EXAM: LEFT ANKLE COMPLETE - 3+ VIEW COMPARISON:  None. FINDINGS: No fracture or dislocation is seen. The ankle mortise is intact. Small plantar calcaneal enthesophyte. Visualized soft tissues are within normal limits. IMPRESSION: Negative. Electronically Signed   By: Julian Hy M.D.   On:  10/17/2020 03:34   DG Ankle Complete Right  Result Date: 10/17/2020 CLINICAL DATA:  Fall, bilateral leg pain EXAM: RIGHT ANKLE - COMPLETE 3+ VIEW COMPARISON:  None. FINDINGS: No fracture or dislocation is seen. The ankle mortise is intact. Small plantar calcaneal these fight. Visualized soft tissues are within normal limits. IMPRESSION: Negative. Electronically Signed   By: Julian Hy M.D.   On: 10/17/2020 03:35   CT Knee Right Wo Contrast  Result Date: 10/17/2020 CLINICAL DATA:  Fall. EXAM: CT OF THE RIGHT KNEE WITHOUT CONTRAST TECHNIQUE: Multidetector CT imaging of the right knee was performed according to the standard protocol. Multiplanar CT image reconstructions were also generated. COMPARISON:  Right knee x-rays from same day. CT right knee dated September 16, 2020. FINDINGS: Bones/Joint/Cartilage There is an early subacute longitudinal fracture through the peripheral medial femoral condyle. There is early surrounding callus formation. In retrospect, this fracture was present on the prior CT. There is 4 mm medial and anterior displacement. No new fracture. No dislocation. Prior total knee arthroplasty. No evidence of hardware failure or loosening. Moderate joint effusion, increased from prior. Ligaments Ligaments are suboptimally evaluated by CT. Muscles and Tendons Grossly intact. Soft tissue Mild generalized soft tissue swelling. No fluid collection or hematoma. No soft tissue mass. IMPRESSION: 1. Early subacute longitudinal fracture through the peripheral medial femoral condyle. No new fracture. 2. Moderate joint  effusion, increased from prior CT. Electronically Signed   By: Titus Dubin M.D.   On: 10/17/2020 09:16   DG Knee Complete 4 Views Left  Result Date: 10/17/2020 CLINICAL DATA:  Fall, bilateral leg pain EXAM: LEFT KNEE - COMPLETE 4+ VIEW COMPARISON:  None. FINDINGS: Left knee arthroplasty, without evidence of complication. No fracture or dislocation is seen. Visualized soft tissues are within normal limits. No suprapatellar knee joint effusion. IMPRESSION: Left knee arthroplasty, without evidence of complication. No fracture or dislocation is seen. Electronically Signed   By: Julian Hy M.D.   On: 10/17/2020 03:30   DG Knee Complete 4 Views Right  Result Date: 10/17/2020 CLINICAL DATA:  Fall, bilateral leg pain EXAM: RIGHT KNEE - COMPLETE 4+ VIEW COMPARISON:  None. FINDINGS: Right knee arthroplasty. Suspected minimally displaced fracture along the medial femoral condyle, only visualized on a single view. Visualized soft tissues are within normal limits. No suprapatellar knee joint effusion. IMPRESSION: Suspected medial femoral condyle fracture. Right knee arthroplasty. Electronically Signed   By: Julian Hy M.D.   On: 10/17/2020 03:33   DG Foot Complete Left  Result Date: 10/17/2020 CLINICAL DATA:  Fall, bilateral leg pain EXAM: LEFT FOOT - COMPLETE 3+ VIEW COMPARISON:  None. FINDINGS: Subtle lucency involving the proximal shaft of the 5th proximal phalanx. Correlate for point tenderness. Otherwise, no fracture is seen. Degenerative changes the 1st MTP joint. Suspected marginal erosion along the distal aspect of the this metatarsal. Small plantar calcaneal enthesophyte. The visualized soft tissues are unremarkable. IMPRESSION: Subtle lucency involving the proximal shaft of the 5th proximal phalanx. Correlate for point tenderness to exclude nondisplaced transverse fracture. Electronically Signed   By: Julian Hy M.D.   On: 10/17/2020 03:37   DG Foot Complete Right  Result Date:  10/17/2020 CLINICAL DATA:  Fall, bilateral leg pain EXAM: RIGHT FOOT COMPLETE - 3+ VIEW COMPARISON:  None. FINDINGS: No fracture or dislocation is seen. Degenerative changes of the 1st MTP joint with marginal erosions, raising the possibility of gout. Small plantar calcaneal enthesophyte. IMPRESSION: No fracture or dislocation is seen. Erosive arthropathy involving the 1st MTP joint,  correlate for gout. Electronically Signed   By: Julian Hy M.D.   On: 10/17/2020 03:39    Microbiology: Recent Results (from the past 240 hour(s))  Resp Panel by RT-PCR (Flu A&B, Covid) Nasopharyngeal Swab     Status: None   Collection Time: 10/17/20  5:42 AM   Specimen: Nasopharyngeal Swab; Nasopharyngeal(NP) swabs in vial transport medium  Result Value Ref Range Status   SARS Coronavirus 2 by RT PCR NEGATIVE NEGATIVE Final    Comment: (NOTE) SARS-CoV-2 target nucleic acids are NOT DETECTED.  The SARS-CoV-2 RNA is generally detectable in upper respiratory specimens during the acute phase of infection. The lowest concentration of SARS-CoV-2 viral copies this assay can detect is 138 copies/mL. A negative result does not preclude SARS-Cov-2 infection and should not be used as the sole basis for treatment or other patient management decisions. A negative result may occur with  improper specimen collection/handling, submission of specimen other than nasopharyngeal swab, presence of viral mutation(s) within the areas targeted by this assay, and inadequate number of viral copies(<138 copies/mL). A negative result must be combined with clinical observations, patient history, and epidemiological information. The expected result is Negative.  Fact Sheet for Patients:  EntrepreneurPulse.com.au  Fact Sheet for Healthcare Providers:  IncredibleEmployment.be  This test is no t yet approved or cleared by the [place] FDA and  has been authorized for detection and/or  diagnosis of SARS-CoV-2 by FDA under an Emergency Use Authorization (EUA). This EUA will remain  in effect (meaning this test can be used) for the duration of the COVID-19 declaration under Section 564(b)(1) of the Act, 21 U.S.C.section 360bbb-3(b)(1), unless the authorization is terminated  or revoked sooner.       Influenza A by PCR NEGATIVE NEGATIVE Final   Influenza B by PCR NEGATIVE NEGATIVE Final    Comment: (NOTE) The Xpert Xpress SARS-CoV-2/FLU/RSV plus assay is intended as an aid in the diagnosis of influenza from Nasopharyngeal swab specimens and should not be used as a sole basis for treatment. Nasal washings and aspirates are unacceptable for Xpert Xpress SARS-CoV-2/FLU/RSV testing.  Fact Sheet for Patients: EntrepreneurPulse.com.au  Fact Sheet for Healthcare Providers: IncredibleEmployment.be  This test is not yet approved or cleared by the [place] FDA and has been authorized for detection and/or diagnosis of SARS-CoV-2 by FDA under an Emergency Use Authorization (EUA). This EUA will remain in effect (meaning this test can be used) for the duration of the COVID-19 declaration under Section 564(b)(1) of the Act, 21 U.S.C. section 360bbb-3(b)(1), unless the authorization is terminated or revoked.  Performed at [facility], [address] [place],  [address]   Resp Panel by RT-PCR (Flu A&B, Covid) Nasopharyngeal Swab     Status: None   Collection Time: 10/20/20 10:31 AM   Specimen: Nasopharyngeal Swab; Nasopharyngeal(NP) swabs in vial transport medium  Result Value Ref Range Status   SARS Coronavirus 2 by RT PCR NEGATIVE NEGATIVE Final    Comment: (NOTE) SARS-CoV-2 target nucleic acids are NOT DETECTED.  The SARS-CoV-2 RNA is generally detectable in upper respiratory specimens during the acute phase of infection. The lowest concentration of SARS-CoV-2 viral copies this assay can detect is 138  copies/mL. A negative result does not preclude SARS-Cov-2 infection and should not be used as the sole basis for treatment or other patient management decisions. A negative result may occur with  improper specimen collection/handling, submission of specimen other than nasopharyngeal swab, presence of viral mutation(s) within the areas targeted by this assay,  and inadequate number of viral copies(<138 copies/mL). A negative result must be combined with clinical observations, patient history, and epidemiological information. The expected result is Negative.  Fact Sheet for Patients:  EntrepreneurPulse.com.au  Fact Sheet for Healthcare Providers:  IncredibleEmployment.be  This test is no t yet approved or cleared by the [place] FDA and  has been authorized for detection and/or diagnosis of SARS-CoV-2 by FDA under an Emergency Use Authorization (EUA). This EUA will remain  in effect (meaning this test can be used) for the duration of the COVID-19 declaration under Section 564(b)(1) of the Act, 21 U.S.C.section 360bbb-3(b)(1), unless the authorization is terminated  or revoked sooner.       Influenza A by PCR NEGATIVE NEGATIVE Final   Influenza B by PCR NEGATIVE NEGATIVE Final    Comment: (NOTE) The Xpert Xpress SARS-CoV-2/FLU/RSV plus assay is intended as an aid in the diagnosis of influenza from Nasopharyngeal swab specimens and should not be used as a sole basis for treatment. Nasal washings and aspirates are unacceptable for Xpert Xpress SARS-CoV-2/FLU/RSV testing.  Fact Sheet for Patients: EntrepreneurPulse.com.au  Fact Sheet for Healthcare Providers: IncredibleEmployment.be  This test is not yet approved or cleared by the [place] FDA and has been authorized for detection and/or diagnosis of SARS-CoV-2 by FDA under an Emergency Use Authorization (EUA). This EUA will remain in effect (meaning  this test can be used) for the duration of the COVID-19 declaration under Section 564(b)(1) of the Act, 21 U.S.C. section 360bbb-3(b)(1), unless the authorization is terminated or revoked.  Performed at [facility], [address] [place], [place] [address]      Labs: CBC: Recent Labs  Lab 10/17/20 0534 10/18/20 0457 10/20/20 0615  WBC 9.0 7.7 5.6  NEUTROABS 4.0  --   --   HGB 14.1 13.4 12.8  HCT 42.4 41.6 38.6  MCV 91.2 92.9 92.3  PLT 142* 134* 407*   Basic Metabolic Panel: Recent Labs  Lab 10/17/20 0534 10/18/20 0457 10/20/20 0615  NA 140 139 140  K 3.6 4.3 4.6  CL 102 102 101  CO2 28 32 30  GLUCOSE 209* 307* 214*  BUN 15 18 15   CREATININE 0.87 1.16* 0.85  CALCIUM 8.5* 8.6* 9.1   Liver Function Tests: Recent Labs  Lab 10/17/20 0534  AST 22  ALT 27  ALKPHOS 100  BILITOT 1.0  PROT 6.4*  ALBUMIN 3.2*   CBG: Recent Labs  Lab 10/19/20 1126 10/19/20 1630 10/19/20 2059 10/20/20 0746 10/20/20 1141  GLUCAP 375* 143* 138* 194* 267*    Time spent: 35 minutes  Signed:  Berle Mull  [facility]  10/20/2020

## 2020-10-20 NOTE — Progress Notes (Signed)
Inpatient Diabetes Program Recommendations  AACE/ADA: New Consensus Statement on Inpatient Glycemic Control   Target Ranges:  Prepandial:   less than 140 mg/dL      Peak postprandial:   less than 180 mg/dL (1-2 hours)      Critically ill patients:  140 - 180 mg/dL   Results for Traci Carter, Traci Carter (MRN [id number]) as of 10/20/2020 10:31  Ref. Range 10/19/2020 08:17 10/19/2020 11:26 10/19/2020 16:30 10/19/2020 20:59 10/20/2020 07:46  Glucose-Capillary Latest Ref Range: 70 - 99 mg/dL 260 (H) 375 (H) 143 (H) 138 (H) 194 (H)   Results for Traci Carter, Traci Carter (MRN [id number]) as of 10/20/2020 10:31  Ref. Range 09/16/2020 09:07  Hemoglobin A1C Latest Ref Range: 4.8 - 5.6 % 7.0 (H)   Review of Glycemic Control  Diabetes history: DM2 Outpatient Diabetes medications: Novolog 0-9 units; was taking Glipizide 5 mg daily Current orders for Inpatient glycemic control: Lantus 6 units QHS, Novolog 4 units TID with meals, Novolog 0-15 units TID with meals, Novolog 0-5 units QHS, Glipizide 5 mg QAM   Inpatient Diabetes Program Recommendations:   Insulin: Noted Glipizide ordered today. May want to consider discontinuing Lantus since Glipizide will be started today.   Outpatient DM medications: Noted patient was ordered Glipizide 5 mg daily which was stopped at last hospital discharge on 09/21/20 (per discharge summary by Dr. [facility], "Patient is glipizide 5 mg oral daily, I will hold it on discharge as she will remain on sliding scale at the facility." )  At time of discharge, will likely need to prescribe additional DM medication.   Thanks, Barnie Alderman, RN, MSN, CDE Diabetes Coordinator Inpatient Diabetes Program [phone] (Team Pager from 8am to 5pm)

## 2020-10-20 NOTE — Plan of Care (Signed)
  Problem: Education: Goal: Knowledge of General Education information will improve Description: Including pain rating scale, medication(s)/side effects and non-pharmacologic comfort measures 10/20/2020 1436 by Orvan Seen, RN Outcome: Completed/Met 10/20/2020 1023 by Orvan Seen, RN Outcome: Progressing   Problem: Health Behavior/Discharge Planning: Goal: Ability to manage health-related needs will improve 10/20/2020 1436 by Orvan Seen, RN Outcome: Completed/Met 10/20/2020 1023 by Orvan Seen, RN Outcome: Progressing   Problem: Clinical Measurements: Goal: Ability to maintain clinical measurements within normal limits will improve 10/20/2020 1436 by Orvan Seen, RN Outcome: Completed/Met 10/20/2020 1023 by Orvan Seen, RN Outcome: Progressing Goal: Will remain free from infection 10/20/2020 1436 by Orvan Seen, RN Outcome: Completed/Met 10/20/2020 1023 by Orvan Seen, RN Outcome: Progressing Goal: Diagnostic test results will improve 10/20/2020 1436 by Orvan Seen, RN Outcome: Completed/Met 10/20/2020 1023 by Orvan Seen, RN Outcome: Progressing Goal: Respiratory complications will improve 10/20/2020 1436 by Orvan Seen, RN Outcome: Completed/Met 10/20/2020 1023 by Orvan Seen, RN Outcome: Progressing Goal: Cardiovascular complication will be avoided 10/20/2020 1436 by Orvan Seen, RN Outcome: Completed/Met 10/20/2020 1023 by Orvan Seen, RN Outcome: Progressing   Problem: Activity: Goal: Risk for activity intolerance will decrease 10/20/2020 1436 by Orvan Seen, RN Outcome: Completed/Met 10/20/2020 1023 by Orvan Seen, RN Outcome: Progressing   Problem: Nutrition: Goal: Adequate nutrition will be maintained 10/20/2020 1436 by Orvan Seen, RN Outcome: Completed/Met 10/20/2020 1023 by Orvan Seen, RN Outcome: Progressing   Problem: Coping: Goal: Level of anxiety will decrease 10/20/2020  1436 by Orvan Seen, RN Outcome: Completed/Met 10/20/2020 1023 by Orvan Seen, RN Outcome: Progressing   Problem: Elimination: Goal: Will not experience complications related to bowel motility 10/20/2020 1436 by Orvan Seen, RN Outcome: Completed/Met 10/20/2020 1023 by Orvan Seen, RN Outcome: Progressing Goal: Will not experience complications related to urinary retention 10/20/2020 1436 by Orvan Seen, RN Outcome: Completed/Met 10/20/2020 1023 by Orvan Seen, RN Outcome: Progressing   Problem: Pain Managment: Goal: General experience of comfort will improve 10/20/2020 1436 by Orvan Seen, RN Outcome: Completed/Met 10/20/2020 1023 by Orvan Seen, RN Outcome: Progressing   Problem: Safety: Goal: Ability to remain free from injury will improve 10/20/2020 1436 by Orvan Seen, RN Outcome: Completed/Met 10/20/2020 1023 by Orvan Seen, RN Outcome: Progressing   Problem: Skin Integrity: Goal: Risk for impaired skin integrity will decrease 10/20/2020 1436 by Orvan Seen, RN Outcome: Completed/Met 10/20/2020 1023 by Orvan Seen, RN Outcome: Progressing

## 2020-10-20 NOTE — Progress Notes (Signed)
Physical Therapy Treatment Patient Details Name: Traci Carter MRN: [id number] DOB: [birth year] Today's Date: 10/20/2020    History of Present Illness Pt is a 75 y/o F who presented to the ER with acute onset of BLE pain. Per pt, at home she tried to get out of the recliner when her knees buckled & she fell straight down on B knees. Right knee x-ray showed suspected medial femoral condyle fracture and right knee arthropathy.  Left knee showed left knee arthropathy without evidence of complications. Orthopedics was consulted. CT scan shows presence of a minimally displaced medial condyle fracture with callus formation that was present on the prior CT scan on 6/15. Knee arthroplasty appears stable & pt will be managed nonoperatively. PMH: DM2, HTN, CVA (intracranial hemorrhage, 2014), a-fib, fibromyalgia, depression, anxiety, gout    PT Comments    Pt pleasant and motivated throughout today's session. Agreeable to ambulate; PT brought w/c follow for safety as pt had a fall yesterday. She ambulated using RW and CGA to steady. Ambulation distance improved from 68ft to 69ft > 63ft > 22ft. Increased step cadence noted during the final bout; distance limited by R knee pain (10/10) and bilateral ankle weakness. STS from w/c height remained MOD Ax2 to lift however improved throughout session as the amount of lift required decreased. Bed mobility remains SUP for safety. PT changed gown and chuck during session and ensured pt comfort at end of session. Pt would benefit from skilled PT to address above deficits and promote optimal return to PLOF.   Follow Up Recommendations  SNF;Supervision/Assistance - 24 hour     Equipment Recommendations   (TBD in next venue)    Recommendations for Other Services       Precautions / Restrictions Precautions Precautions: Fall Restrictions Weight Bearing Restrictions: Yes RLE Weight Bearing: Weight bearing as tolerated    Mobility  Bed Mobility Overal bed  mobility: Needs Assistance Bed Mobility: Sit to Supine     Supine to sit: Supervision;HOB elevated Sit to supine: Supervision   General bed mobility comments: Definite use of bed rails. Pt was able to reposition once supine using BUE on bed rails, BLE and VC by PT.    Transfers Overall transfer level: Needs assistance Equipment used: Rolling walker (2 wheeled) Transfers: Sit to/from Stand Sit to Stand: Min assist         General transfer comment: MIN A to stand from elevated bed, x1 rep. MOD Ax2 to assist with lift from w/c, x 3 reps. VC for all reps on technique and optimal hand placement.  Ambulation/Gait Ambulation/Gait assistance: Min assist;+2 safety/equipment Gait Distance (Feet): 52 Feet Assistive device: Rolling walker (2 wheeled) Gait Pattern/deviations: Decreased step length - right;Decreased step length - left;Decreased dorsiflexion - right;Decreased dorsiflexion - left;Decreased stride length;Shuffle;Trunk flexed;Decreased stance time - right Gait velocity: significantly decreased   General Gait Details: 3 bouts of ambulation: 50ft > 34ft > 63ft. W/c follow for safety, CGA to steady. VC on pacing and ambulation distance as UE WB increased through RW. Cadence and gait velocity increased during last bout of walking however remains diminished. VC on posture.   Stairs             Wheelchair Mobility    Modified Rankin (Stroke Patients Only)       Balance Overall balance assessment: Needs assistance Sitting-balance support: Feet supported;No upper extremity supported Sitting balance-Leahy Scale: Good     Standing balance support: Bilateral upper extremity supported;During functional activity Standing balance-Leahy Scale: Traci Carter Standing  balance comment: CGA to steady throughout standing, ambulation and 180* turn                            Cognition Arousal/Alertness: Awake/alert Behavior During Therapy: WFL for tasks  assessed/performed Overall Cognitive Status: Within Functional Limits for tasks assessed                                 General Comments: Pt excited for family to come visit today      Exercises Other Exercises Other Exercises: STS: 4 reps throughout session. Mod A of 2 people required to stand from standard height surface (3 reps), VC required with each rep on hand placement.    General Comments        Pertinent Vitals/Pain Pain Assessment: 0-10 Pain Score: 10-Worst pain ever Pain Location: R knee Pain Descriptors / Indicators: Aching;Sore Pain Intervention(s): Limited activity within patient's tolerance;Monitored during session    Home Living                      Prior Function            PT Goals (current goals can now be found in the care plan section) Acute Rehab PT Goals Patient Stated Goal: get better, go home PT Goal Formulation: With patient Time For Goal Achievement: 11/01/20 Potential to Achieve Goals: Fair    Frequency    BID      PT Plan      Co-evaluation              AM-PAC PT "6 Clicks" Mobility   Outcome Measure  Help needed turning from your back to your side while in a flat bed without using bedrails?: A Little Help needed moving from lying on your back to sitting on the side of a flat bed without using bedrails?: A Little Help needed moving to and from a bed to a chair (including a wheelchair)?: A Little Help needed standing up from a chair using your arms (e.g., wheelchair or bedside chair)?: A Little Help needed to walk in hospital room?: A Little Help needed climbing 3-5 steps with a railing? : Total 6 Click Score: 16    End of Session Equipment Utilized During Treatment: Gait belt;Other (comment) (manual wheelchair for w/c follow) Activity Tolerance: Patient limited by fatigue;Patient tolerated treatment well;Patient limited by pain Patient left: with call bell/phone within reach;in bed;with bed alarm  set Nurse Communication: Mobility status PT Visit Diagnosis: Muscle weakness (generalized) (M62.81);Difficulty in walking, not elsewhere classified (R26.2)     Time: 1010-1056 PT Time Calculation (min) (ACUTE ONLY): 46 min  Charges:  $Gait Training: 23-37 mins $Therapeutic Activity: 8-22 mins                     Patrina Levering PT, DPT 10/20/20 11:56 AM [phone]    [facility] 10/20/2020, 11:56 AM

## 2020-12-08 ENCOUNTER — Other Ambulatory Visit: Payer: Self-pay | Admitting: Internal Medicine

## 2020-12-08 DIAGNOSIS — Z1231 Encounter for screening mammogram for malignant neoplasm of breast: Secondary | ICD-10-CM

## 2020-12-08 DIAGNOSIS — E2839 Other primary ovarian failure: Secondary | ICD-10-CM

## 2020-12-15 ENCOUNTER — Other Ambulatory Visit: Payer: Self-pay

## 2020-12-15 ENCOUNTER — Ambulatory Visit (INDEPENDENT_AMBULATORY_CARE_PROVIDER_SITE_OTHER): Payer: Medicare Other | Admitting: Podiatry

## 2020-12-15 DIAGNOSIS — M79675 Pain in left toe(s): Secondary | ICD-10-CM | POA: Diagnosis not present

## 2020-12-15 DIAGNOSIS — M7752 Other enthesopathy of left foot: Secondary | ICD-10-CM

## 2020-12-15 DIAGNOSIS — M7751 Other enthesopathy of right foot: Secondary | ICD-10-CM | POA: Diagnosis not present

## 2020-12-15 DIAGNOSIS — B351 Tinea unguium: Secondary | ICD-10-CM

## 2020-12-15 DIAGNOSIS — E119 Type 2 diabetes mellitus without complications: Secondary | ICD-10-CM

## 2020-12-15 DIAGNOSIS — M79674 Pain in right toe(s): Secondary | ICD-10-CM

## 2020-12-15 DIAGNOSIS — E0843 Diabetes mellitus due to underlying condition with diabetic autonomic (poly)neuropathy: Secondary | ICD-10-CM | POA: Diagnosis not present

## 2020-12-15 MED ORDER — OXYCODONE-ACETAMINOPHEN 5-325 MG PO TABS: 1.0000 | ORAL_TABLET | Freq: Three times a day (TID) | ORAL | 0 refills | Status: DC | PRN

## 2020-12-15 MED ORDER — BETAMETHASONE SOD PHOS & ACET 6 (3-3) MG/ML IJ SUSP
3.0000 mg | Freq: Once | INTRAMUSCULAR | Status: AC
  Administered 2020-12-15: 3 mg via INTRA_ARTICULAR

## 2020-12-15 NOTE — Progress Notes (Signed)
   SUBJECTIVE Patient with a history of diabetes mellitus presents to office today complaining of elongated, thickened nails that cause pain while ambulating in shoes.  Patient is unable to trim their own nails.   Patient states that she is also been experiencing significant pain and tenderness to the bilateral feet.  She states that she has been diagnosed with gout in the past.  She states that it time she has been admitted into the hospital for pain management regarding the bilateral foot pain.  This was when she was diagnosed with gout.  She presents for further treatment and evaluation patient is here for further evaluation and treatment.   Past Medical History:  Diagnosis Date   Afib ([place])    Diabetes mellitus without complication ([place])    Dysrhythmia    Hypertension    Stroke ([place]) 07/21/12   posterior superior left frontal lobe parenchymal hemorrhage     OBJECTIVE General Patient is awake, alert, and oriented x 3 and in no acute distress. Derm Skin is dry and supple bilateral. Negative open lesions or macerations. Remaining integument unremarkable. Nails are tender, long, thickened and dystrophic with subungual debris, consistent with onychomycosis, 1-5 bilateral. No signs of infection noted. Vasc  DP and PT pedal pulses palpable bilaterally. Temperature gradient within normal limits.  Neuro Epicritic and protective threshold sensation diminished bilaterally.  Musculoskeletal Exam No symptomatic pedal deformities noted bilateral. Muscular strength within normal limits.  Significant pain on palpation and tenderness to the bilateral ankle joints  ASSESSMENT 1. Diabetes Mellitus w/ peripheral neuropathy 2.  Pain due to onychomycosis of toenails bilateral 3.  DJD/capsulitis bilateral ankles  PLAN OF CARE 1. Patient evaluated today.  Comprehensive diabetic foot exam performed today 2. Instructed to maintain good pedal hygiene and foot care. Stressed importance of controlling blood  sugar.  3. Mechanical debridement of nails 1-5 bilaterally performed using a nail nipper. Filed with dremel without incident.  4.  Injection of 0.5 cc Celestone Soluspan injected into the bilateral ankle joints  5.  Prescription for Percocet 5/'3 2 5 '$ mg #20 every 8 hours as needed  6.  Return to clinic in 4 weeks    Edrick Kins, DPM [facility]  Dr. Edrick Kins, DPM    [address], [place] [address]                Office [phone]  Fax [phone]

## 2021-01-14 ENCOUNTER — Encounter: Payer: Self-pay | Admitting: Internal Medicine

## 2021-01-14 ENCOUNTER — Inpatient Hospital Stay
Admission: EM | Admit: 2021-01-14 | Discharge: 2021-01-18 | DRG: 689 | Disposition: A | Discharge status: Home Health | Payer: Medicare Other | Attending: Family Medicine | Admitting: Family Medicine

## 2021-01-14 ENCOUNTER — Emergency Department: Payer: Medicare Other

## 2021-01-14 DIAGNOSIS — G9341 Metabolic encephalopathy: Secondary | ICD-10-CM

## 2021-01-14 DIAGNOSIS — R4182 Altered mental status, unspecified: Secondary | ICD-10-CM

## 2021-01-14 DIAGNOSIS — A419 Sepsis, unspecified organism: Secondary | ICD-10-CM

## 2021-01-14 DIAGNOSIS — Z8673 Personal history of transient ischemic attack (TIA), and cerebral infarction without residual deficits: Secondary | ICD-10-CM

## 2021-01-14 DIAGNOSIS — M109 Gout, unspecified: Secondary | ICD-10-CM

## 2021-01-14 DIAGNOSIS — I10 Essential (primary) hypertension: Secondary | ICD-10-CM

## 2021-01-14 DIAGNOSIS — N390 Urinary tract infection, site not specified: Secondary | ICD-10-CM | POA: Diagnosis present

## 2021-01-14 DIAGNOSIS — M10072 Idiopathic gout, left ankle and foot: Principal | ICD-10-CM

## 2021-01-14 DIAGNOSIS — E1165 Type 2 diabetes mellitus with hyperglycemia: Secondary | ICD-10-CM

## 2021-01-14 DIAGNOSIS — D6869 Other thrombophilia: Secondary | ICD-10-CM

## 2021-01-14 DIAGNOSIS — R739 Hyperglycemia, unspecified: Secondary | ICD-10-CM

## 2021-01-14 DIAGNOSIS — I482 Chronic atrial fibrillation, unspecified: Secondary | ICD-10-CM

## 2021-01-14 DIAGNOSIS — Z880 Allergy status to penicillin: Secondary | ICD-10-CM | POA: Diagnosis not present

## 2021-01-14 DIAGNOSIS — M797 Fibromyalgia: Secondary | ICD-10-CM | POA: Diagnosis present

## 2021-01-14 DIAGNOSIS — E876 Hypokalemia: Secondary | ICD-10-CM | POA: Diagnosis present

## 2021-01-14 DIAGNOSIS — Z885 Allergy status to narcotic agent status: Secondary | ICD-10-CM | POA: Diagnosis not present

## 2021-01-14 DIAGNOSIS — Z7901 Long term (current) use of anticoagulants: Secondary | ICD-10-CM | POA: Diagnosis not present

## 2021-01-14 DIAGNOSIS — I480 Paroxysmal atrial fibrillation: Secondary | ICD-10-CM | POA: Diagnosis present

## 2021-01-14 DIAGNOSIS — D6859 Other primary thrombophilia: Secondary | ICD-10-CM | POA: Diagnosis present

## 2021-01-14 DIAGNOSIS — Z888 Allergy status to other drugs, medicaments and biological substances status: Secondary | ICD-10-CM

## 2021-01-14 DIAGNOSIS — E785 Hyperlipidemia, unspecified: Secondary | ICD-10-CM | POA: Diagnosis present

## 2021-01-14 DIAGNOSIS — N3000 Acute cystitis without hematuria: Secondary | ICD-10-CM | POA: Diagnosis not present

## 2021-01-14 DIAGNOSIS — Z20822 Contact with and (suspected) exposure to covid-19: Secondary | ICD-10-CM | POA: Diagnosis present

## 2021-01-14 HISTORY — DX: Type 2 diabetes mellitus without complications: E11.9

## 2021-01-14 HISTORY — DX: Essential (primary) hypertension: I10

## 2021-01-14 HISTORY — DX: Unspecified atrial fibrillation: I48.91

## 2021-01-14 LAB — BLOOD GAS, VENOUS
Acid-Base Excess: 8.4 mmol/L — ABNORMAL HIGH (ref 0.0–2.0)
Bicarbonate: 36.2 mmol/L — ABNORMAL HIGH (ref 20.0–28.0)
O2 Saturation: 66.3 %
Patient temperature: 37
pCO2, Ven: 64 mmHg — ABNORMAL HIGH (ref 44.0–60.0)
pH, Ven: 7.36 (ref 7.250–7.430)
pO2, Ven: 36 mmHg (ref 32.0–45.0)

## 2021-01-14 LAB — URINALYSIS, COMPLETE (UACMP) WITH MICROSCOPIC
Bilirubin Urine: NEGATIVE
Glucose, UA: >=500 mg/dL — AB
Hgb urine dipstick: NEGATIVE
Ketones, ur: NEGATIVE mg/dL
Nitrite: POSITIVE — AB
Protein, ur: 30 mg/dL — AB
Specific Gravity, Urine: 1.022 (ref 1.005–1.030)
pH: 5 (ref 5.0–8.0)

## 2021-01-14 LAB — COMPREHENSIVE METABOLIC PANEL
ALT: 24 U/L (ref 0–44)
AST: 26 U/L (ref 15–41)
Albumin: 2.3 g/dL — ABNORMAL LOW (ref 3.5–5.0)
Alkaline Phosphatase: 87 U/L (ref 38–126)
Anion gap: 13 (ref 5–15)
BUN: 16 mg/dL (ref 8–23)
CO2: 31 mmol/L (ref 22–32)
Calcium: 8.9 mg/dL (ref 8.9–10.3)
Chloride: 91 mmol/L — ABNORMAL LOW (ref 98–111)
Creatinine, Ser: 0.98 mg/dL (ref 0.44–1.00)
GFR, Estimated: >60 mL/min (ref >60)
Glucose, Bld: 432 mg/dL — ABNORMAL HIGH (ref 70–99)
Potassium: 3.4 mmol/L — ABNORMAL LOW (ref 3.5–5.1)
Sodium: 135 mmol/L (ref 135–145)
Total Bilirubin: 0.8 mg/dL (ref 0.3–1.2)
Total Protein: 6.6 g/dL (ref 6.5–8.1)

## 2021-01-14 LAB — CBC WITH DIFFERENTIAL/PLATELET
Abs Immature Granulocytes: 0.06 10*3/uL (ref 0.00–0.07)
Basophils Absolute: 0 10*3/uL (ref 0.0–0.1)
Basophils Relative: 0 %
Eosinophils Absolute: 0 10*3/uL (ref 0.0–0.5)
Eosinophils Relative: 0 %
HCT: 35.6 % — ABNORMAL LOW (ref 36.0–46.0)
Hemoglobin: 12 g/dL (ref 12.0–15.0)
Immature Granulocytes: 0 %
Lymphocytes Relative: 12 %
Lymphs Abs: 1.7 10*3/uL (ref 0.7–4.0)
MCH: 30.2 pg (ref 26.0–34.0)
MCHC: 33.7 g/dL (ref 30.0–36.0)
MCV: 89.7 fL (ref 80.0–100.0)
Monocytes Absolute: 1 10*3/uL (ref 0.1–1.0)
Monocytes Relative: 7 %
Neutro Abs: 11.4 10*3/uL — ABNORMAL HIGH (ref 1.7–7.7)
Neutrophils Relative %: 81 %
Platelets: 338 10*3/uL (ref 150–400)
RBC: 3.97 MIL/uL (ref 3.87–5.11)
RDW: 12.6 % (ref 11.5–15.5)
WBC: 14.1 10*3/uL — ABNORMAL HIGH (ref 4.0–10.5)
nRBC: 0 % (ref 0.0–0.2)

## 2021-01-14 LAB — AMMONIA: Ammonia: <10 umol/L (ref 9–35)

## 2021-01-14 LAB — BETA-HYDROXYBUTYRIC ACID: Beta-Hydroxybutyric Acid: 0.1 mmol/L (ref 0.05–0.27)

## 2021-01-14 LAB — LACTIC ACID, PLASMA
Lactic Acid, Venous: 1.8 mmol/L (ref 0.5–1.9)
Lactic Acid, Venous: 1.8 mmol/L (ref 0.5–1.9)

## 2021-01-14 LAB — MAGNESIUM: Magnesium: 1.1 mg/dL — ABNORMAL LOW (ref 1.7–2.4)

## 2021-01-14 MED ORDER — LACTATED RINGERS IV SOLN: INTRAVENOUS | Status: AC

## 2021-01-14 MED ORDER — SODIUM CHLORIDE 0.9 % IV SOLN
2.0000 g | INTRAVENOUS | Status: DC
  Administered 2021-01-14 – 2021-01-16 (×3): 2 g via INTRAVENOUS
  Filled 2021-01-14: qty 20
  Filled 2021-01-14: qty 2
  Filled 2021-01-14 (×2): qty 20

## 2021-01-14 MED ORDER — LACTATED RINGERS IV BOLUS (SEPSIS)
1000.0000 mL | Freq: Once | INTRAVENOUS | Status: AC
  Administered 2021-01-14: 1000 mL via INTRAVENOUS

## 2021-01-14 MED ORDER — LACTATED RINGERS IV BOLUS
1000.0000 mL | Freq: Once | INTRAVENOUS | Status: AC
  Administered 2021-01-14: 1000 mL via INTRAVENOUS

## 2021-01-14 NOTE — Consult Note (Signed)
CODE SEPSIS - PHARMACY COMMUNICATION  **Broad Spectrum Antibiotics should be administered within 1 hour of Sepsis diagnosis**  Time Code Sepsis Called/Page Received: 1926  Antibiotics Ordered: ceftriaxone 2gms @1930   Time of 1st antibiotic administration: 2016  Additional action taken by pharmacy:   If necessary, Name of Provider/Nurse Contacted: secure chat to nurse C. [place]ngton @2008  to expedite administration of Rocephin    Traci Carter ,PharmD Clinical Pharmacist  01/14/2021  7:45 PM

## 2021-01-14 NOTE — ED Notes (Signed)
Dr. Smith at bedside.

## 2021-01-14 NOTE — Sepsis Progress Note (Signed)
Monitoring for the code sepsis protocol. °

## 2021-01-14 NOTE — Sepsis Progress Note (Addendum)
Notified bedside nurse of need to draw blood cultures.  Spoke with bedside nurse C. Watlington via secure chat who reported previous RN drew & sent blood cultures x2 to lab, & verified lab has them in processing.  Epic showing only one set drawn at 2206, after antibiotics given.

## 2021-01-14 NOTE — ED Notes (Signed)
Blood cultures drawn. 2 sets

## 2021-01-14 NOTE — H&P (Signed)
History and Physical    Traci Carter TDD:[id number] DOB: [birth year] DOA: 01/14/2021  PCP: Pcp, No   Patient coming from: home  I have personally briefly reviewed patient's old medical records in [place]  Chief Complaint: confusion, high blood sugar, foot pain  HPI: Traci Carter is a 75 y.o. female with medical history significant for DM, HTN, gout, A. fib on Eliquis, hemorrhagic CVA on Keppra, fibromyalgia, who was brought to the ED with a 2-day history of increased confusion and altered mental status that was preceded by a several day history of increasing left foot pain from her gout, and staying in bed for the past several days on account of the pain.  She had no cough or shortness of breath or chest pain and denied nausea, vomiting or abdominal pain and had no diarrhea.  History is limited because of confusion.  She also had elevated blood sugars while at home.  ED course: On arrival afebrile, mildly tachycardic at 106, BP 116/75, respirations 21 with O2 sat 94% on room air Blood work WBC 14,000 with normal lactic acid of 1.8/1.8.  CMP significant for blood glucose of 438.  Beta hydroxybutyric acid 0.1.  Anion gap 13.  Ammonia less than 10 Urinalysis with positive nitrite small leukocyte esterase and many bacteria  EKG, personally reviewed and interpreted: A. fib at 107 with no acute ST-T wave changes  Imaging: CT head with no acute intracranial abnormality Chest x-ray no active cardiopulmonary disease  Patient was given IV fluids started on IV ceftriaxone.  Hospitalist consulted for admission.  Review of Systems: Unable to obtain due to confusion  Past Medical History:  Diagnosis Date   Atrial fibrillation ([place])    DM (diabetes mellitus) ([place])    HTN (hypertension)     History reviewed. No pertinent surgical history.   reports that she has never smoked. She has never used smokeless tobacco. She reports that she does not drink alcohol. No history on file for drug  use.  Allergies  Allergen Reactions   Codeine    Lanoxin [Digoxin]    Penicillins    Zoloft [Sertraline Hcl]     History reviewed. No pertinent family history.    Prior to Admission medications   Not on File    Physical Exam: Vitals:   01/14/21 1756 01/14/21 1908 01/14/21 1916 01/14/21 1930  BP:  112/77 109/84 119/88  Pulse:  99 89 95  Resp:  20 18 19   Temp:      TempSrc:      SpO2:  99% 95% 95%  Weight: 81.6 kg     Height: 5\' 5"  (1.651 m)        Vitals:   01/14/21 1756 01/14/21 1908 01/14/21 1916 01/14/21 1930  BP:  112/77 109/84 119/88  Pulse:  99 89 95  Resp:  20 18 19   Temp:      TempSrc:      SpO2:  99% 95% 95%  Weight: 81.6 kg     Height: 5\' 5"  (1.651 m)         Constitutional: Alert and oriented x 1. Not in any apparent distress HEENT:      Head: Normocephalic and atraumatic.         Eyes: PERLA, EOMI, Conjunctivae are normal. Sclera is non-icteric.       Mouth/Throat: Mucous membranes are moist.       Neck: Supple with no signs of meningismus. Cardiovascular: Regular rate and rhythm. No murmurs, gallops, or rubs. 2+  symmetrical distal pulses are present . No JVD. No LE edema Respiratory: Respiratory effort normal .Lungs sounds clear bilaterally. No wheezes, crackles, or rhonchi.  Gastrointestinal: Soft, non tender, and non distended with positive bowel sounds.  Genitourinary: No CVA tenderness. Musculoskeletal: Redness left first MTP   Neurologic:  Face is symmetric. Moving all extremities. No gross focal neurologic deficits . Skin: Skin is warm, dry.  No rash or ulcers Psychiatric: Mood and affect are normal    Labs on Admission: I have personally reviewed following labs and imaging studies  CBC: Recent Labs  Lab 01/14/21 1626  WBC 14.1*  NEUTROABS 11.4*  HGB 12.0  HCT 35.6*  MCV 89.7  PLT 295   Basic Metabolic Panel: Recent Labs  Lab 01/14/21 1626  NA 135  K 3.4*  CL 91*  CO2 31  GLUCOSE 432*  BUN 16  CREATININE 0.98   CALCIUM 8.9  MG 1.1*   GFR: Estimated Creatinine Clearance: 53.9 mL/min (by C-G formula based on SCr of 0.98 mg/dL). Liver Function Tests: Recent Labs  Lab 01/14/21 1626  AST 26  ALT 24  ALKPHOS 87  BILITOT 0.8  PROT 6.6  ALBUMIN 2.3*   No results for input(s): LIPASE, AMYLASE in the last 168 hours. Recent Labs  Lab 01/14/21 1626  AMMONIA <10   Coagulation Profile: No results for input(s): INR, PROTIME in the last 168 hours. Cardiac Enzymes: No results for input(s): CKTOTAL, CKMB, CKMBINDEX, TROPONINI in the last 168 hours. BNP (last 3 results) No results for input(s): PROBNP in the last 8760 hours. HbA1C: No results for input(s): HGBA1C in the last 72 hours. CBG: No results for input(s): GLUCAP in the last 168 hours. Lipid Profile: No results for input(s): CHOL, HDL, LDLCALC, TRIG, CHOLHDL, LDLDIRECT in the last 72 hours. Thyroid Function Tests: No results for input(s): TSH, T4TOTAL, FREET4, T3FREE, THYROIDAB in the last 72 hours. Anemia Panel: No results for input(s): VITAMINB12, FOLATE, FERRITIN, TIBC, IRON, RETICCTPCT in the last 72 hours. Urine analysis:    Component Value Date/Time   COLORURINE YELLOW (A) 01/14/2021 1825   APPEARANCEUR CLOUDY (A) 01/14/2021 1825   LABSPEC 1.022 01/14/2021 1825   PHURINE 5.0 01/14/2021 1825   GLUCOSEU >=500 (A) 01/14/2021 1825   HGBUR NEGATIVE 01/14/2021 1825   [place] 01/14/2021 1825   KETONESUR NEGATIVE 01/14/2021 1825   PROTEINUR 30 (A) 01/14/2021 1825   NITRITE POSITIVE (A) 01/14/2021 1825   LEUKOCYTESUR SMALL (A) 01/14/2021 1825    Radiological Exams on Admission: CT HEAD WO CONTRAST (5MM)  Result Date: 01/14/2021 CLINICAL DATA:  Mental status change, unknown cause, hyperglycemia EXAM: CT HEAD WITHOUT CONTRAST TECHNIQUE: Contiguous axial images were obtained from the base of the skull through the vertex without intravenous contrast. COMPARISON:  None. FINDINGS: Brain: No evidence of parenchymal  hemorrhage or extra-axial fluid collection. No mass lesion, mass effect, or midline shift. No CT evidence of acute infarction. Nonspecific mild to moderate subcortical and periventricular white matter hypodensity, most in keeping with chronic small vessel ischemic change. Generalized cerebral volume loss. No ventriculomegaly. Vascular: No acute abnormality. Skull: No evidence of calvarial fracture. Sinuses/Orbits: The visualized paranasal sinuses are essentially clear. Other:  The mastoid air cells are unopacified. IMPRESSION: 1. No evidence of acute intracranial abnormality. 2. Generalized cerebral volume loss and mild-to-moderate chronic small vessel ischemic changes in the cerebral white matter. Electronically Signed   By: Ilona Sorrel M.D.   On: 01/14/2021 17:02   DG Chest Portable 1 View  Result Date: 01/14/2021 CLINICAL  DATA:  Altered mental status, hyperglycemia EXAM: PORTABLE CHEST 1 VIEW COMPARISON:  None. FINDINGS: Two lead left subclavian pacemaker with lead tips overlying the right atrium and right ventricle. Top normal heart size. Normal mediastinal contour. No pneumothorax. No pleural effusion. Low lung volumes. No overt pulmonary edema. No consolidative airspace disease. IMPRESSION: Low lung volumes. No active cardiopulmonary disease. Electronically Signed   By: Ilona Sorrel M.D.   On: 01/14/2021 16:59     Assessment/Plan 75 year old female with history of DM, HTN, gout, A. fib on Eliquis, CVA on Keppra, fibromyalgia, who was brought to the ED with a 2-day history of  confusion and altered mental status and high blood sugars, that was preceded by a several day history of increasing left foot pain from her gout causing her to spend most of the day in bed.    UTI  Acute metabolic encephalopathy - Patient presents with confusion and lethargy not in keeping with her baseline, likely related to urinary tract infection, not meeting sepsis criteria - IV Rocephin and follow cultures -  Neurochecks, fall and aspiration precautions    Gout flare left first MTP joint - Colchicine and pain control and continue allopurinol - We will avoid steroid for now given hyperglycemia    Hyperglycemia due to type 2 diabetes mellitus (HCC) - Blood sugar in the 400s - Sliding scale insulin coverage    Atrial fibrillation, chronic (HCC) - Heart rate in the low 100s likely related to acute infection - Not currently on rate control agents - Continue Eliquis for stroke prevention    HTN (hypertension) - Continue lisinopril    History of CVA (cerebrovascular accident) - Continue atorvastatin    DVT prophylaxis: Continue Eliquis  code Status: full code  Family Communication:  none  Disposition Plan: Back to previous home environment Consults called: none  Status:At the time of admission, it appears that the appropriate admission status for this patient is INPATIENT. This is judged to be reasonable and necessary in order to provide the required intensity of service to ensure the patient's safety given the presenting symptoms, physical exam findings, and initial radiographic and laboratory data in the context of their  Comorbid conditions.   Patient requires inpatient status due to high intensity of service, high risk for further deterioration and high frequency of surveillance required.   I certify that at the point of admission it is my clinical judgment that the patient will require inpatient hospital care spanning beyond [place] MD [facility]     01/14/2021, 8:16 PM

## 2021-01-14 NOTE — ED Provider Notes (Signed)
[facility] Emergency Department Provider Note ____________________________________________   Event Date/Time   First MD Initiated Contact with Patient 01/14/21 1624     (approximate)  I have reviewed the triage vital signs and the nursing notes.  HISTORY  Chief Complaint No chief complaint on file.   HPI Traci Carter is a 75 y.o. femalewho presents to the ED for evaluation of altered mentation and hyperglycemia.   Chart review indicates no history in our system. Charts later merged and I can review her history.  Paroxysmal atrial fibrillation on Eliquis.  HTN, HLD and DM on multiple oral agents.  No insulin.  Gout on allopurinol.  Patient presents to the ED via EMS from home for evaluation of hyperglycemia and confusion.  History obtained by EMS indicates that throughout the day today patient has had "high" glucose levels and has been confused, laid up in bed complaining about increasing foot pain from her known gout.  History is unable to be obtained from the patient due to her confusion.  She reports pain to her bilateral feet from her gout that has been there "for a while."  No other complaints.  No past medical history on file.  There are no problems to display for this patient.     Prior to Admission medications   Not on File    Allergies Patient has no allergy information on record.  No family history on file.  Social History    Review of Systems  Unable to be accurately assessed due to patient's altered mentation ____________________________________________   PHYSICAL EXAM:  VITAL SIGNS: Vitals:   01/14/21 1730 01/14/21 1745  BP:    Pulse:  94  Resp:  18  Temp: 98.6 F (37 C)   SpO2:  95%     Constitutional: Alert and pleasantly disoriented.  Oriented to self only.  No distress.  Follows commands in all 4 extremities.  Sits up in bed by herself and holds her self up.  Obese Eyes: Conjunctivae are normal. PERRL.  EOMI. Head: Atraumatic. Nose: No congestion/rhinnorhea. Mouth/Throat: Mucous membranes are dry.  Oropharynx non-erythematous. Neck: No stridor. No cervical spine tenderness to palpation. Cardiovascular: Tachycardic and irregular rhythm. Grossly normal heart sounds.  Good peripheral circulation. Respiratory: Normal respiratory effort.  No retractions. Lungs CTAB. Gastrointestinal: Soft , nondistended, nontender to palpation. No CVA tenderness. Musculoskeletal: Inflamed MCPs bilateral to her feet, left worse than right. Neurologic:  Normal speech and language. No gross focal neurologic deficits are appreciated.  Skin:  Skin is warm, dry and intact. No rash noted beyond erythema to her toes, pictured below. Psychiatric: Mood and affect are normal. Speech and behavior are irregular    ____________________________________________   LABS (all labs ordered are listed, but only abnormal results are displayed)  Labs Reviewed  COMPREHENSIVE METABOLIC PANEL - Abnormal; Notable for the following components:      Result Value   Potassium 3.4 (*)    Chloride 91 (*)    Glucose, Bld 432 (*)    Albumin 2.3 (*)    All other components within normal limits  CBC WITH DIFFERENTIAL/PLATELET - Abnormal; Notable for the following components:   WBC 14.1 (*)    HCT 35.6 (*)    Neutro Abs 11.4 (*)    All other components within normal limits  MAGNESIUM - Abnormal; Notable for the following components:   Magnesium 1.1 (*)    All other components within normal limits  URINALYSIS, COMPLETE (UACMP) WITH MICROSCOPIC - Abnormal; Notable for  the following components:   Color, Urine YELLOW (*)    APPearance CLOUDY (*)    Glucose, UA >=500 (*)    Protein, ur 30 (*)    Nitrite POSITIVE (*)    Leukocytes,Ua SMALL (*)    Bacteria, UA MANY (*)    All other components within normal limits  RESP PANEL BY RT-PCR (FLU A&B, COVID) [id number]  URINE CULTURE  CULTURE, BLOOD (SINGLE)  BETA-HYDROXYBUTYRIC ACID  LACTIC  ACID, PLASMA  LACTIC ACID, PLASMA  AMMONIA  BLOOD GAS, VENOUS   ____________________________________________  12 Lead EKG   ____________________________________________  RADIOLOGY  ED MD interpretation:  CXR reviewed by me without evidence of acute cardiopulmonary pathology.  CT head reviewed by me  Official radiology report(s): CT HEAD WO CONTRAST (5MM)  Result Date: 01/14/2021 CLINICAL DATA:  Mental status change, unknown cause, hyperglycemia EXAM: CT HEAD WITHOUT CONTRAST TECHNIQUE: Contiguous axial images were obtained from the base of the skull through the vertex without intravenous contrast. COMPARISON:  None. FINDINGS: Brain: No evidence of parenchymal hemorrhage or extra-axial fluid collection. No mass lesion, mass effect, or midline shift. No CT evidence of acute infarction. Nonspecific mild to moderate subcortical and periventricular white matter hypodensity, most in keeping with chronic small vessel ischemic change. Generalized cerebral volume loss. No ventriculomegaly. Vascular: No acute abnormality. Skull: No evidence of calvarial fracture. Sinuses/Orbits: The visualized paranasal sinuses are essentially clear. Other:  The mastoid air cells are unopacified. IMPRESSION: 1. No evidence of acute intracranial abnormality. 2. Generalized cerebral volume loss and mild-to-moderate chronic small vessel ischemic changes in the cerebral white matter. Electronically Signed   By: Ilona Sorrel M.D.   On: 01/14/2021 17:02   DG Chest Portable 1 View  Result Date: 01/14/2021 CLINICAL DATA:  Altered mental status, hyperglycemia EXAM: PORTABLE CHEST 1 VIEW COMPARISON:  None. FINDINGS: Two lead left subclavian pacemaker with lead tips overlying the right atrium and right ventricle. Top normal heart size. Normal mediastinal contour. No pneumothorax. No pleural effusion. Low lung volumes. No overt pulmonary edema. No consolidative airspace disease. IMPRESSION: Low lung volumes. No active  cardiopulmonary disease. Electronically Signed   By: Ilona Sorrel M.D.   On: 01/14/2021 16:59    ____________________________________________   PROCEDURES and INTERVENTIONS  Procedure(s) performed (including Critical Care):  .Critical Care Performed by: Vladimir Crofts, MD Authorized by: Vladimir Crofts, MD   Critical care provider statement:    Critical care time (minutes):  30   Critical care time was exclusive of:  Separately billable procedures and treating other patients   Critical care was necessary to treat or prevent imminent or life-threatening deterioration of the following conditions:  Sepsis   Critical care was time spent personally by me on the following activities:  Ordering and performing treatments and interventions, ordering and review of laboratory studies, ordering and review of radiographic studies, review of old charts, re-evaluation of patient's condition, evaluation of patient's response to treatment and development of treatment plan with patient or surrogate .1-3 Lead EKG Interpretation Performed by: Vladimir Crofts, MD Authorized by: Vladimir Crofts, MD     Interpretation: abnormal     ECG rate:  106   ECG rate assessment: tachycardic     Rhythm: atrial fibrillation     Ectopy: none     Conduction: normal    Medications  lactated ringers infusion (has no administration in time range)  lactated ringers bolus 1,000 mL (has no administration in time range)  cefTRIAXone (ROCEPHIN) 2 g in sodium chloride 0.9 % 100  mL IVPB (has no administration in time range)  lactated ringers bolus 1,000 mL (0 mLs Intravenous Stopped 01/14/21 1914)    ____________________________________________   MDM / ED COURSE   Patient presents to the ED confused and hyperglycemic, without evidence of DKA, but with evidence of sepsis possibly due to UTI, and requiring medical admission.  Tachycardic but responsive to IV fluids.  No instability or evidence of septic shock.  Blood work with mild  leukocytosis, but no lactic acidosis.  No signs of hyperammonemia or DKA.  CT head without evidence of ICH.  Urine with some infectious features and possibly the source of her symptoms.  She has evidence of gout to her bilateral great toes, no further evidence of trauma and unlikely to represent a septic joint.  We will discuss with medicine for admission.  Clinical Course as of 01/14/21 1928  Thu Jan 14, 2021  1728 Reassessed.  Clinically similar.  Husband is at the bedside and very poor historian.  I asked him to go back to the house to get her medications. [DS]  1912 Reassessed.  Husband has arrived with medications from home. [DS]  [place] like her chart was originally entered incorrectly.  I review her chart that has not yet merged.  I review her medical history. [DS]    Clinical Course User Index [DS] Vladimir Crofts, MD    ____________________________________________   FINAL CLINICAL IMPRESSION(S) / ED DIAGNOSES  Final diagnoses:  Sepsis with encephalopathy without septic shock, due to unspecified organism [facility])  Altered mental status, unspecified altered mental status type  Hyperglycemia     ED Discharge Orders     None        Yoceline Bazar   Note:  This document was prepared using Dragon voice recognition software and may include unintentional dictation errors.    Vladimir Crofts, MD 01/14/21 1929

## 2021-01-14 NOTE — ED Notes (Signed)
Pt cleaned of urine, clean linens and brief placed, pt put on purwick

## 2021-01-14 NOTE — ED Notes (Signed)
ED TO INPATIENT HANDOFF REPORT  ED Nurse Name and Phone #: [phone]  S Name/Age/Gender Traci Carter 75 y.o. female Room/Bed: ED06A/ED06A  Code Status   Code Status: Not on file  Home/SNF/Other Home Patient oriented to: self, place, and time Is this baseline? Yes   Triage Complete: Triage complete  Chief Complaint UTI (urinary tract infection) [N39.0]  Triage Note No notes on file   Allergies Allergies  Allergen Reactions   Codeine    Lanoxin [Digoxin]    Penicillins    Zoloft [Sertraline Hcl]     Level of Care/Admitting Diagnosis ED Disposition     ED Disposition  Admit   Condition  --   [place] [facility]: [place] [[id number]]  Level of Care: Med-Surg [16]  Covid Evaluation: Asymptomatic Screening Protocol (No Symptoms)  Diagnosis: UTI (urinary tract infection) [322025]  Admitting Physician: Athena Masse [[id number]]  Attending Physician: Athena Masse [[id number]]  Estimated length of stay: past midnight tomorrow  Certification:: I certify this patient will need inpatient services for at least 2 midnights          B Medical/Surgery History Past Medical History:  Diagnosis Date   Atrial fibrillation ([place])    DM (diabetes mellitus) ([place])    HTN (hypertension)    History reviewed. No pertinent surgical history.   A IV Location/Drains/Wounds Patient Lines/Drains/Airways Status     Active Line/Drains/Airways     Name Placement date Placement time Site Days   Peripheral IV 01/14/21 20 G Left Antecubital 01/14/21  1658  Antecubital  less than 1   Peripheral IV 01/14/21 22 G 1" Left;Posterior Wrist 01/14/21  1750  Wrist  less than 1            Intake/Output Last 24 hours No intake or output data in the 24 hours ending 01/14/21 2020  Labs/Imaging Results for orders placed or performed during the hospital encounter of 01/14/21 (from the past 48 hour(s))  Comprehensive metabolic panel     Status: Abnormal    Collection Time: 01/14/21  4:26 PM  Result Value Ref Range   Sodium 135 135 - 145 mmol/L   Potassium 3.4 (L) 3.5 - 5.1 mmol/L   Chloride 91 (L) 98 - 111 mmol/L   CO2 31 22 - 32 mmol/L   Glucose, Bld 432 (H) 70 - 99 mg/dL    Comment: Glucose reference range applies only to samples taken after fasting for at least 8 hours.   BUN 16 8 - 23 mg/dL   Creatinine, Ser 0.98 0.44 - 1.00 mg/dL   Calcium 8.9 8.9 - 10.3 mg/dL   Total Protein 6.6 6.5 - 8.1 g/dL   Albumin 2.3 (L) 3.5 - 5.0 g/dL   AST 26 15 - 41 U/L   ALT 24 0 - 44 U/L   Alkaline Phosphatase 87 38 - 126 U/L   Total Bilirubin 0.8 0.3 - 1.2 mg/dL   GFR, Estimated >60 >60 mL/min    Comment: (NOTE) Calculated using the CKD-EPI Creatinine Equation (2021)    Anion gap 13 5 - 15    Comment: Performed at [facility], [address], [place], [place] [address]  CBC with Differential/Platelet     Status: Abnormal   Collection Time: 01/14/21  4:26 PM  Result Value Ref Range   WBC 14.1 (H) 4.0 - 10.5 K/uL   RBC 3.97 3.87 - 5.11 MIL/uL   Hemoglobin 12.0 12.0 - 15.0 g/dL   HCT 35.6 (L) 36.0 -  46.0 %   MCV 89.7 80.0 - 100.0 fL   MCH 30.2 26.0 - 34.0 pg   MCHC 33.7 30.0 - 36.0 g/dL   RDW 12.6 11.5 - 15.5 %   Platelets 338 150 - 400 K/uL   nRBC 0.0 0.0 - 0.2 %   Neutrophils Relative % 81 %   Neutro Abs 11.4 (H) 1.7 - 7.7 K/uL   Lymphocytes Relative 12 %   Lymphs Abs 1.7 0.7 - 4.0 K/uL   Monocytes Relative 7 %   Monocytes Absolute 1.0 0.1 - 1.0 K/uL   Eosinophils Relative 0 %   Eosinophils Absolute 0.0 0.0 - 0.5 K/uL   Basophils Relative 0 %   Basophils Absolute 0.0 0.0 - 0.1 K/uL   Immature Granulocytes 0 %   Abs Immature Granulocytes 0.06 0.00 - 0.07 K/uL    Comment: Performed at [facility], [address] [place], [place] [address]  Magnesium     Status: Abnormal   Collection Time: 01/14/21  4:26 PM  Result Value Ref Range   Magnesium 1.1 (L) 1.7 - 2.4 mg/dL    Comment: Performed at [facility], [address] [place], [place] [address]  Beta-hydroxybutyric acid     Status: None   Collection Time: 01/14/21  4:26 PM  Result Value Ref Range   Beta-Hydroxybutyric Acid 0.10 0.05 - 0.27 mmol/L    Comment: Performed at [facility], [address] [place], [place] [address]  Lactic acid, plasma     Status: None   Collection Time: 01/14/21  4:26 PM  Result Value Ref Range   Lactic Acid, Venous 1.8 0.5 - 1.9 mmol/L    Comment: Performed at [facility], [address] [place], [place] [address]  Ammonia     Status: None   Collection Time: 01/14/21  4:26 PM  Result Value Ref Range   Ammonia <10 9 - 35 umol/L    Comment: Performed at [facility], [address] [place], [place] [address]  Urinalysis, Complete w Microscopic     Status: Abnormal   Collection Time: 01/14/21  6:25 PM  Result Value Ref Range   Color, Urine YELLOW (A) YELLOW   APPearance CLOUDY (A) CLEAR   Specific Gravity, Urine 1.022 1.005 - 1.030   pH 5.0 5.0 - 8.0   Glucose, UA >=500 (A) NEGATIVE mg/dL   Hgb urine dipstick NEGATIVE NEGATIVE   Bilirubin Urine NEGATIVE NEGATIVE   Ketones, ur NEGATIVE NEGATIVE mg/dL   Protein, ur 30 (A) NEGATIVE mg/dL   Nitrite POSITIVE (A) NEGATIVE   Leukocytes,Ua SMALL (A) NEGATIVE   RBC / HPF 0-5 0 - 5 RBC/hpf   WBC, UA 21-50 0 - 5 WBC/hpf   Bacteria, UA MANY (A) NONE SEEN   Squamous Epithelial / LPF 0-5 0 - 5   Mucus PRESENT     Comment: Performed at [facility], [address] [place], [place] [address]  Lactic acid, plasma     Status: None   Collection Time: 01/14/21  6:41 PM  Result Value Ref Range   Lactic Acid, Venous 1.8 0.5 - 1.9 mmol/L    Comment: Performed at [facility], [address] [place], [place] [address]   CT HEAD WO CONTRAST (5MM)  Result Date: 01/14/2021 CLINICAL DATA:  Mental status change, unknown cause, hyperglycemia EXAM: CT HEAD WITHOUT CONTRAST TECHNIQUE: Contiguous axial images were  obtained from the base of the skull through the vertex without intravenous contrast. COMPARISON:  None. FINDINGS: Brain: No evidence of parenchymal hemorrhage or extra-axial fluid collection. No mass lesion, mass effect, or midline shift. No CT evidence of acute infarction. Nonspecific mild to moderate subcortical and periventricular white matter hypodensity, most in keeping with chronic small vessel ischemic change. Generalized cerebral volume loss. No ventriculomegaly. Vascular: No acute abnormality. Skull: No evidence of calvarial fracture. Sinuses/Orbits: The visualized paranasal sinuses are essentially clear. Other:  The mastoid air cells are unopacified. IMPRESSION: 1. No evidence of acute intracranial abnormality. 2. Generalized cerebral volume loss and mild-to-moderate chronic small vessel ischemic changes in the cerebral white matter. Electronically Signed   By: Ilona Sorrel M.D.   On: 01/14/2021 17:02   DG Chest Portable 1 View  Result Date: 01/14/2021 CLINICAL DATA:  Altered mental status, hyperglycemia EXAM: PORTABLE CHEST 1 VIEW COMPARISON:  None. FINDINGS: Two lead left subclavian pacemaker with lead tips overlying the right atrium and right ventricle. Top normal heart size. Normal mediastinal contour. No pneumothorax. No pleural effusion. Low lung volumes. No overt pulmonary edema. No consolidative airspace disease. IMPRESSION: Low lung volumes. No active cardiopulmonary disease. Electronically Signed   By: Ilona Sorrel M.D.   On: 01/14/2021 16:59    Pending Labs Unresulted Labs (From admission, onward)     Start     Ordered   01/14/21 1926  Culture, blood (single)  (Undifferentiated -> Now sepsis confirmed (treatment and sepsis specific nursing orders))  ONCE - STAT,   STAT        01/14/21 1925   01/14/21 1925  Urine Culture  Once,   STAT       Question:  Indication  Answer:  Sepsis   01/14/21 1925   01/14/21 1628  Resp Panel by RT-PCR (Flu A&B, Covid) Nasopharyngeal Swab  (Tier 2 -  Symptomatic/asymptomatic)  Once,   STAT        01/14/21 1627   01/14/21 1627  Blood gas, venous  ONCE - STAT,   STAT        01/14/21 1627            Vitals/Pain Today's Vitals   01/14/21 1756 01/14/21 1908 01/14/21 1916 01/14/21 1930  BP:  112/77 109/84 119/88  Pulse:  99 89 95  Resp:  20 18 19   Temp:      TempSrc:      SpO2:  99% 95% 95%  Weight: 81.6 kg     Height: 5\' 5"  (1.651 m)       Isolation Precautions No active isolations  Medications Medications  lactated ringers infusion ( Intravenous New Bag/Given 01/14/21 2012)  lactated ringers bolus 1,000 mL (1,000 mLs Intravenous New Bag/Given 01/14/21 2016)  cefTRIAXone (ROCEPHIN) 2 g in sodium chloride 0.9 % 100 mL IVPB (2 g Intravenous New Bag/Given 01/14/21 2016)  lactated ringers bolus 1,000 mL (0 mLs Intravenous Stopped 01/14/21 1914)    Mobility walks     Focused Assessments Neuro Assessment Handoff:   Cardiac Rhythm: Atrial fibrillation       Neuro Assessment: Exceptions to WDL  If patient is a Neuro Trauma and patient is going to OR before floor call report to [place] nurse: [phone] or [phone]   R Recommendations: See Admitting Provider Note  Report given to:   Additional Notes: A&O x 3 / has a purewick /

## 2021-01-14 NOTE — ED Notes (Signed)
Pt back from CT. Pt pulled EMS IV out. Will place new IV.

## 2021-01-15 DIAGNOSIS — E1165 Type 2 diabetes mellitus with hyperglycemia: Secondary | ICD-10-CM

## 2021-01-15 DIAGNOSIS — M10072 Idiopathic gout, left ankle and foot: Secondary | ICD-10-CM

## 2021-01-15 DIAGNOSIS — I482 Chronic atrial fibrillation, unspecified: Secondary | ICD-10-CM

## 2021-01-15 DIAGNOSIS — N3000 Acute cystitis without hematuria: Secondary | ICD-10-CM

## 2021-01-15 DIAGNOSIS — G9341 Metabolic encephalopathy: Secondary | ICD-10-CM | POA: Diagnosis not present

## 2021-01-15 LAB — CBC
HCT: 30.2 % — ABNORMAL LOW (ref 36.0–46.0)
Hemoglobin: 10.1 g/dL — ABNORMAL LOW (ref 12.0–15.0)
MCH: 29.8 pg (ref 26.0–34.0)
MCHC: 33.4 g/dL (ref 30.0–36.0)
MCV: 89.1 fL (ref 80.0–100.0)
Platelets: 283 10*3/uL (ref 150–400)
RBC: 3.39 MIL/uL — ABNORMAL LOW (ref 3.87–5.11)
RDW: 12.8 % (ref 11.5–15.5)
WBC: 10.5 10*3/uL (ref 4.0–10.5)
nRBC: 0 % (ref 0.0–0.2)

## 2021-01-15 LAB — BASIC METABOLIC PANEL
Anion gap: 8 (ref 5–15)
BUN: 12 mg/dL (ref 8–23)
CO2: 32 mmol/L (ref 22–32)
Calcium: 8.4 mg/dL — ABNORMAL LOW (ref 8.9–10.3)
Chloride: 99 mmol/L (ref 98–111)
Creatinine, Ser: 0.77 mg/dL (ref 0.44–1.00)
GFR, Estimated: >60 mL/min (ref >60)
Glucose, Bld: 198 mg/dL — ABNORMAL HIGH (ref 70–99)
Potassium: 2.5 mmol/L — CL (ref 3.5–5.1)
Sodium: 139 mmol/L (ref 135–145)

## 2021-01-15 LAB — GLUCOSE, CAPILLARY
Glucose-Capillary: 272 mg/dL — ABNORMAL HIGH (ref 70–99)
Glucose-Capillary: 183 mg/dL — ABNORMAL HIGH (ref 70–99)
Glucose-Capillary: 212 mg/dL — ABNORMAL HIGH (ref 70–99)
Glucose-Capillary: 133 mg/dL — ABNORMAL HIGH (ref 70–99)
Glucose-Capillary: 150 mg/dL — ABNORMAL HIGH (ref 70–99)

## 2021-01-15 LAB — RESP PANEL BY RT-PCR (FLU A&B, COVID) ARPGX2
Influenza A by PCR: NEGATIVE
Influenza B by PCR: NEGATIVE
SARS Coronavirus 2 by RT PCR: NEGATIVE

## 2021-01-15 LAB — POTASSIUM: Potassium: 3.3 mmol/L — ABNORMAL LOW (ref 3.5–5.1)

## 2021-01-15 MED ORDER — ACETAMINOPHEN 650 MG RE SUPP: 650.0000 mg | Freq: Four times a day (QID) | RECTAL | Status: DC | PRN

## 2021-01-15 MED ORDER — INSULIN ASPART 100 UNIT/ML IJ SOLN
0.0000 [IU] | Freq: Three times a day (TID) | INTRAMUSCULAR | Status: DC
  Administered 2021-01-15: 5 [IU] via SUBCUTANEOUS
  Administered 2021-01-15: 3 [IU] via SUBCUTANEOUS
  Administered 2021-01-15: 2 [IU] via SUBCUTANEOUS
  Administered 2021-01-16: 5 [IU] via SUBCUTANEOUS
  Administered 2021-01-16: 3 [IU] via SUBCUTANEOUS
  Administered 2021-01-17 (×2): 2 [IU] via SUBCUTANEOUS
  Administered 2021-01-18: 3 [IU] via SUBCUTANEOUS
  Filled 2021-01-15 (×8): qty 1

## 2021-01-15 MED ORDER — POTASSIUM CHLORIDE 10 MEQ/100ML IV SOLN
10.0000 meq | INTRAVENOUS | Status: DC
Start: 2021-01-15 — End: 2021-01-15

## 2021-01-15 MED ORDER — INSULIN ASPART 100 UNIT/ML IJ SOLN
0.0000 [IU] | Freq: Every day | INTRAMUSCULAR | Status: DC
  Administered 2021-01-15: 3 [IU] via SUBCUTANEOUS

## 2021-01-15 MED ORDER — ACETAMINOPHEN 325 MG PO TABS: 650.0000 mg | ORAL_TABLET | Freq: Four times a day (QID) | ORAL | Status: DC | PRN

## 2021-01-15 MED ORDER — ONDANSETRON HCL 4 MG PO TABS: 4.0000 mg | ORAL_TABLET | Freq: Four times a day (QID) | ORAL | Status: DC | PRN

## 2021-01-15 MED ORDER — ALLOPURINOL 100 MG PO TABS
100.0000 mg | ORAL_TABLET | Freq: Every day | ORAL | Status: DC
  Administered 2021-01-15 – 2021-01-18 (×4): 100 mg via ORAL
  Filled 2021-01-15 (×4): qty 1

## 2021-01-15 MED ORDER — ONDANSETRON HCL 4 MG/2ML IJ SOLN
4.0000 mg | Freq: Four times a day (QID) | INTRAMUSCULAR | Status: DC | PRN
  Administered 2021-01-17 (×2): 4 mg via INTRAVENOUS
  Filled 2021-01-15 (×2): qty 2

## 2021-01-15 MED ORDER — SENNOSIDES-DOCUSATE SODIUM 8.6-50 MG PO TABS
1.0000 | ORAL_TABLET | Freq: Every evening | ORAL | Status: DC | PRN
  Administered 2021-01-15: 1 via ORAL
  Filled 2021-01-15: qty 1

## 2021-01-15 MED ORDER — COLCHICINE 0.6 MG PO TABS
0.6000 mg | ORAL_TABLET | Freq: Two times a day (BID) | ORAL | Status: DC | PRN
  Administered 2021-01-15: 0.6 mg via ORAL
  Filled 2021-01-15: qty 1

## 2021-01-15 MED ORDER — LISINOPRIL 5 MG PO TABS
5.0000 mg | ORAL_TABLET | Freq: Every day | ORAL | Status: DC
  Administered 2021-01-15 – 2021-01-18 (×4): 5 mg via ORAL
  Filled 2021-01-15 (×4): qty 1

## 2021-01-15 MED ORDER — APIXABAN 5 MG PO TABS
5.0000 mg | ORAL_TABLET | Freq: Two times a day (BID) | ORAL | Status: DC
  Administered 2021-01-15 – 2021-01-18 (×7): 5 mg via ORAL
  Filled 2021-01-15 (×7): qty 1

## 2021-01-15 MED ORDER — ALBUTEROL SULFATE (2.5 MG/3ML) 0.083% IN NEBU: 2.5000 mg | INHALATION_SOLUTION | Freq: Four times a day (QID) | RESPIRATORY_TRACT | Status: DC | PRN

## 2021-01-15 MED ORDER — GLIPIZIDE ER 5 MG PO TB24
5.0000 mg | ORAL_TABLET | Freq: Every day | ORAL | Status: DC
  Filled 2021-01-15: qty 1

## 2021-01-15 MED ORDER — LEVETIRACETAM 500 MG PO TABS
1000.0000 mg | ORAL_TABLET | Freq: Two times a day (BID) | ORAL | Status: DC
  Administered 2021-01-15 – 2021-01-18 (×8): 1000 mg via ORAL
  Filled 2021-01-15 (×10): qty 2

## 2021-01-15 MED ORDER — POTASSIUM CHLORIDE 10 MEQ/100ML IV SOLN
10.0000 meq | INTRAVENOUS | Status: AC
Start: 2021-01-15 — End: 2021-01-15
  Administered 2021-01-15 (×6): 10 meq via INTRAVENOUS
  Filled 2021-01-15 (×5): qty 100

## 2021-01-15 MED ORDER — DULOXETINE HCL 60 MG PO CPEP
60.0000 mg | ORAL_CAPSULE | Freq: Every day | ORAL | Status: DC
  Administered 2021-01-15 – 2021-01-18 (×4): 60 mg via ORAL
  Filled 2021-01-15 (×4): qty 1

## 2021-01-15 MED ORDER — ATORVASTATIN CALCIUM 20 MG PO TABS
20.0000 mg | ORAL_TABLET | Freq: Every day | ORAL | Status: DC
  Administered 2021-01-15 – 2021-01-18 (×4): 20 mg via ORAL
  Filled 2021-01-15 (×4): qty 1

## 2021-01-15 MED ORDER — HYDROCODONE-ACETAMINOPHEN 5-325 MG PO TABS
1.0000 | ORAL_TABLET | ORAL | Status: DC | PRN
  Administered 2021-01-15: 2 via ORAL
  Administered 2021-01-15: 1 via ORAL
  Administered 2021-01-15: 2 via ORAL
  Administered 2021-01-16 – 2021-01-17 (×4): 1 via ORAL
  Administered 2021-01-17: 2 via ORAL
  Administered 2021-01-17: 1 via ORAL
  Filled 2021-01-15: qty 2
  Filled 2021-01-15 (×4): qty 1
  Filled 2021-01-15: qty 2
  Filled 2021-01-15: qty 1
  Filled 2021-01-15: qty 2
  Filled 2021-01-15: qty 1

## 2021-01-15 MED ORDER — POTASSIUM CHLORIDE CRYS ER 20 MEQ PO TBCR
20.0000 meq | EXTENDED_RELEASE_TABLET | ORAL | Status: AC
Start: 2021-01-15 — End: 2021-01-16
  Administered 2021-01-15 – 2021-01-16 (×3): 20 meq via ORAL
  Filled 2021-01-15 (×3): qty 1

## 2021-01-15 MED ORDER — MAGNESIUM SULFATE 4 GM/100ML IV SOLN
4.0000 g | Freq: Once | INTRAVENOUS | Status: AC
  Administered 2021-01-15: 4 g via INTRAVENOUS
  Filled 2021-01-15: qty 100

## 2021-01-15 NOTE — Plan of Care (Signed)
Nutrition Education Note  RD consulted for nutrition education regarding nutrition management for gout.    RD provided "Low purine/ purine restricted therapy" handout from the Academy of [employer] and Dietetics. Explained reasons for pt to follow diet. Reviewed foods high in purine that should be limited. Discussed moderate purine foods and daily limits. Recommended avoiding alcohol.  Teach back method used. Pt verbalizes understanding of information provided.   Expect good compliance.  Body mass index is Body mass index is 29.95 kg/m.Marland Kitchen Pt meets criteria for overweight based on current BMI.  Current diet order is heart healthy/ carb modified, patient is consuming approximately 50% of meals at this time. Labs and medications reviewed. No further nutrition interventions warranted at this time. RD contact information provided. If additional nutrition issues arise, please re-consult RD.  Ranell Patrick, RD, LDN Clinical Dietitian RD pager # available in [place]  After hours/weekend pager # available in [facility]

## 2021-01-15 NOTE — Progress Notes (Addendum)
Attempted IV access X 2 - both hands cold, pt says they're always cold & that she has diabetes. Ultrasound to Lt arm posterior & anterior areas - nothing seen adequate for IV access. Ultrasound to Rt anterior & posterior areas. Attempted x 2 with Ultrasound unsuccessful with # 22 Rt anterior distal & # 20 Rt anterior proximal area. Both had blood return unable to finish threading catheter. Pt does have IV Lt hand that was retaped & flushing easily and without any discomfort. Also has Lt A/C which has potassium running without discomfort. Lt arm placed on folded towel -& supported. eminded pt. not to bend her arm if possible.Has potassium riders running.

## 2021-01-15 NOTE — Progress Notes (Signed)
New admit to unit from the ED, alert and oriented to self only. Unable to complete admission assessment and questions due to confusion. Complained of pain to bilateral feet, relieved with prn pain medications. Edema and redness noted to bilateral feet. Vitals stable no respiratory distress on room air. Will continue to monitor.

## 2021-01-15 NOTE — Progress Notes (Signed)
  Progress Note  Caelyn Route   PIR:[id number]  DOB: [birth year]  DOA: 01/14/2021     1 Date of Service: 01/15/2021   Clinical Course 75 year old woman presented with increasing confusion and decreased mobility from gout in the left foot. --10/13 admitted for acute metabolic encephalopathy, UTI Possibly home next 48 hours  Assessment and Plan * Acute metabolic encephalopathy -- Appears resolved at this point.  Patient alert and oriented.  Secondary to infection.  UTI (urinary tract infection) -- With urinary symptoms.  Follow-up culture data.  Continue empiric antibiotic.  Hyperglycemia due to type 2 diabetes mellitus ([place]) -- Better today but still hyperglycemic.  Continue sliding scale insulin.  Can resume Glucotrol tomorrow.  Resume metformin on discharge.  Gout flare -- Recently started allopurinol about 1 to 2 weeks ago, will continue at this point. -- Exam significant for findings consistent with gout left first toe.  Has difficulty tolerating colchicine as an outpatient secondary to diarrhea.  Treat with prednisone.  Atrial fibrillation, chronic (HCC) -- Stable, continue apixaban, not on rate control agent  Hypomagnesemia, hypokalemia.  Replete.  Subjective:  Feels better today, was having urinary symptoms at home, main issue has been gout which causes difficulty with ambulation, just recently started allopurinol in the last couple of weeks Husband at bedside, brought her to the hospital because of difficulty ambulating  Objective Vitals:   01/15/21 0457 01/15/21 0742 01/15/21 1046 01/15/21 1148  BP: 104/72 94/67 126/64 123/64  Pulse: 85 96 66 78  Resp: 20 14 18 15   Temp: 97.8 F (36.6 C) 98.6 F (37 C)  98.2 F (36.8 C)  TempSrc:      SpO2: 91% 94%  98%  Weight:      Height:       81.6 kg  Vital signs were reviewed and unremarkable.   Exam Physical Exam Constitutional:      General: She is not in acute distress.    Appearance: She is not ill-appearing  or toxic-appearing.  Cardiovascular:     Rate and Rhythm: Normal rate and regular rhythm.     Heart sounds: No murmur heard. Pulmonary:     Effort: Pulmonary effort is normal. No respiratory distress.     Breath sounds: No wheezing, rhonchi or rales.  Abdominal:     Palpations: Abdomen is soft.  Musculoskeletal:     Right lower leg: No edema.     Left lower leg: No edema.  Skin:    Comments: Erythematous first MTP joint left foot consistent with gout  Neurological:     Mental Status: She is alert and oriented to person, place, and time.  Psychiatric:        Mood and Affect: Mood normal.        Behavior: Behavior normal.    Labs / Other Information My review of labs, imaging, notes and other tests is significant for     Potassium 2.5, magnesium was 1.1 yesterday Hyperglycemia better today Hemoglobin down to 10.1  Disposition Plan: Status is: Inpatient  Remains inpatient appropriate because: Marked hypokalemia, hypomagnesemia.  Continue treatment for UTI.  Updated husband at bedside  Time spent: 35 minutes prolonged services [facility] 01/15/2021, 3:15 PM

## 2021-01-15 NOTE — TOC Progression Note (Signed)
Transition of Care [facility]) - Progression Note    Patient Details  Name: Traci Carter MRN: [id number] Date of Birth: 03-16-1946  Transition of Care [facility]) CM/SW [place], RN Phone Number: 01/15/2021, 9:35 AM  Clinical Narrative:    Met with the patient an her husband in the room, she has a Rolling walker and Rolator and cane at home, she is checking her blood sugars daily, her husband provides transportation and her daughter does the grocery shopping and helping with the house cleaning. NO apparent needs at this time        Expected Discharge Plan and Services                                                 Social Determinants of Health (SDOH) Interventions    Readmission Risk Interventions No flowsheet data found.

## 2021-01-15 NOTE — Assessment & Plan Note (Addendum)
--   Secondary to UTI.  Continues to improve, probably very close to baseline.  Complete treatment for UTI.

## 2021-01-15 NOTE — Plan of Care (Signed)
  Problem: Education: Goal: Knowledge of General Education information will improve Description: Including pain rating scale, medication(s)/side effects and non-pharmacologic comfort measures Outcome: Progressing   Problem: Clinical Measurements: Goal: Ability to maintain clinical measurements within normal limits will improve Outcome: Progressing Goal: Will remain free from infection Outcome: Progressing Goal: Diagnostic test results will improve Outcome: Progressing Goal: Respiratory complications will improve Outcome: Progressing Goal: Cardiovascular complication will be avoided Outcome: Progressing   Problem: Pain Managment: Goal: General experience of comfort will improve Outcome: Progressing   Problem: Skin Integrity: Goal: Risk for impaired skin integrity will decrease Outcome: Progressing

## 2021-01-15 NOTE — Assessment & Plan Note (Addendum)
--   CBG stable, wonder if she has been compliant with her medications as an outpatient.  Daughter will be assisting with this from now on.  Resume oral medications on discharge.

## 2021-01-15 NOTE — Progress Notes (Signed)
PT Cancellation Note  Patient Details Name: Traci Carter MRN: [id number] DOB: [birth year]   Cancelled Treatment:    Reason Eval/Treat Not Completed: Medical issues which prohibited therapy (Potassium critically low at 2.5; will wait for re-draw following medical management.)   Archana Eckman A Shakyla Nolley 01/15/2021, 2:04 PM

## 2021-01-15 NOTE — Assessment & Plan Note (Addendum)
--   Recently started allopurinol as outpatient -- Exam significant for findings consistent with gout left first toe greater than right toe.  Has difficulty tolerating colchicine as an outpatient secondary to diarrhea by report although she tolerated it here.  We will continue on discharge.  Try to avoid prednisone given diabetes.

## 2021-01-15 NOTE — Plan of Care (Signed)

## 2021-01-15 NOTE — Assessment & Plan Note (Signed)
--   Stable, continue apixaban, not on rate control agent

## 2021-01-15 NOTE — Hospital Course (Addendum)
75 year old woman presented with increasing confusion and decreased mobility from gout in the left foot. --10/13 admitted for acute metabolic encephalopathy, UTI --10/14 somewhat better, continue antibiotics for UTI, colchicine for gout.  PT consultation. --10/15 mildly more confused today, thinks she is at school, overall however seems to be improving, blood sugar somewhat labile.  Possibly home next 1 to 2 days. --10/16, less confusion today, blood sugars improved, mentation improving, culture pending, anticipate home 10/17

## 2021-01-15 NOTE — Assessment & Plan Note (Addendum)
--   Appears resolved, will complete total 7 days, change to oral

## 2021-01-16 DIAGNOSIS — D6869 Other thrombophilia: Secondary | ICD-10-CM

## 2021-01-16 DIAGNOSIS — M10072 Idiopathic gout, left ankle and foot: Secondary | ICD-10-CM | POA: Diagnosis not present

## 2021-01-16 DIAGNOSIS — G9341 Metabolic encephalopathy: Secondary | ICD-10-CM | POA: Diagnosis not present

## 2021-01-16 DIAGNOSIS — N3000 Acute cystitis without hematuria: Secondary | ICD-10-CM | POA: Diagnosis not present

## 2021-01-16 DIAGNOSIS — E1165 Type 2 diabetes mellitus with hyperglycemia: Secondary | ICD-10-CM | POA: Diagnosis not present

## 2021-01-16 LAB — GLUCOSE, CAPILLARY
Glucose-Capillary: 231 mg/dL — ABNORMAL HIGH (ref 70–99)
Glucose-Capillary: 227 mg/dL — ABNORMAL HIGH (ref 70–99)
Glucose-Capillary: 188 mg/dL — ABNORMAL HIGH (ref 70–99)
Glucose-Capillary: 119 mg/dL — ABNORMAL HIGH (ref 70–99)
Glucose-Capillary: 114 mg/dL — ABNORMAL HIGH (ref 70–99)

## 2021-01-16 LAB — BASIC METABOLIC PANEL
Anion gap: 11 (ref 5–15)
BUN: 11 mg/dL (ref 8–23)
CO2: 27 mmol/L (ref 22–32)
Calcium: 8.7 mg/dL — ABNORMAL LOW (ref 8.9–10.3)
Chloride: 99 mmol/L (ref 98–111)
Creatinine, Ser: 0.83 mg/dL (ref 0.44–1.00)
GFR, Estimated: >60 mL/min (ref >60)
Glucose, Bld: 227 mg/dL — ABNORMAL HIGH (ref 70–99)
Potassium: 4.4 mmol/L (ref 3.5–5.1)
Sodium: 137 mmol/L (ref 135–145)

## 2021-01-16 LAB — MAGNESIUM: Magnesium: 1.8 mg/dL (ref 1.7–2.4)

## 2021-01-16 MED ORDER — GLIPIZIDE ER 5 MG PO TB24
5.0000 mg | ORAL_TABLET | Freq: Every day | ORAL | Status: DC
  Administered 2021-01-16 – 2021-01-18 (×3): 5 mg via ORAL
  Filled 2021-01-16 (×4): qty 1

## 2021-01-16 NOTE — Assessment & Plan Note (Signed)
--  secondary to afib, continue apixaban

## 2021-01-16 NOTE — Progress Notes (Signed)
Progress Note  Traci Carter   [id number]  DOB: 06-06-1945  DOA: 01/14/2021     2 Date of Service: 01/16/2021   Clinical Course 75 year old woman presented with increasing confusion and decreased mobility from gout in the left foot. --10/13 admitted for acute metabolic encephalopathy, UTI --10/14 somewhat better, continue antibiotics for UTI, colchicine for gout.  PT consultation. --10/15 mildly more confused today, thinks she is at school, overall however seems to be improving, blood sugar somewhat labile.  Possibly home next 1 to 2 days.  Assessment and Plan * Acute metabolic encephalopathy -- Mildly confused today.  Think she is in a school.  Daughter reports patient does have some mild confusion.  Continue empiric antibiotics.  UTI (urinary tract infection) -- Remains slightly confused, did have urinary symptoms.  Culture growing gram-negative rods.  Continue empiric antibiotic therapy.  Hyperglycemia due to type 2 diabetes mellitus (HCC) -- CBG labile, continue Glucotrol, can resume metformin on discharge.  Continue sliding scale insulin.  Gout flare -- Recently started allopurinol about 1 to 2 weeks ago, will continue at this point. -- Exam significant for findings consistent with gout left first toe greater than right toe.  Has difficulty tolerating colchicine as an outpatient secondary to diarrhea.  Tolerating here.  Avoid prednisone given hyperglycemia.  Acquired thrombophilia ([place]) --secondary to afib, continue apixaban  Atrial fibrillation, chronic (HCC) -- Stable, continue apixaban, not on rate control agent  Subjective:  Feels okay today, continues to have pain in her left foot.  Think she is at school today.  Objective Vitals:   01/15/21 1046 01/15/21 1148 01/15/21 1536 01/16/21 0435  BP: 126/64 123/64 (!) 139/99 (!) 143/77  Pulse: 66 78 93 (!) 101  Resp: 18 15 15    Temp:  98.2 F (36.8 C) 98.4 F (36.9 C) 98.1 F (36.7 C)  TempSrc:      SpO2:  98% 96%  99%  Weight:      Height:       81.6 kg  Vital signs were reviewed and unremarkable.  Exam Physical Exam Constitutional:      General: She is not in acute distress.    Appearance: She is not ill-appearing or toxic-appearing.  Cardiovascular:     Rate and Rhythm: Normal rate and regular rhythm.     Heart sounds: No murmur heard. Pulmonary:     Effort: Pulmonary effort is normal. No respiratory distress.     Breath sounds: No rhonchi or rales.  Musculoskeletal:        General: Tenderness (left great toe) present.     Right lower leg: No edema.     Left lower leg: No edema.  Skin:    Comments: Erythema left great toe and right great toe  Neurological:     Mental Status: She is alert.  Psychiatric:        Mood and Affect: Mood normal.     Comments: Mildly confused.  Oriented to self, month, year but not location.  Does not recognize Probation officer, thinks he is Ship broker at school.    Labs / Other Information My review of labs, imaging, notes and other tests is significant for    CBG labile.  BMP and magnesium unremarkable.  Blood culture no growth to date.  Greater than 100,000 colonies E. coli growing in urine.  Updated daughter by telephone, patient lives with husband who has dementia and sometimes takes his wife's medication.  Not clear patient is compliant with medications.  Daughter helps his much as  patient and father allow.  She plans to take care of the pill count for the week.  It has been challenging helping her parents.  Full code Apixaban  Disposition Plan: Status is: Inpatient  Remains inpatient appropriate because: Confused, ongoing treatment for symptomatic UTI pending culture results  Time spent: 35 minutes [facility] 01/16/2021, 3:51 PM

## 2021-01-16 NOTE — Evaluation (Signed)
Physical Therapy Evaluation Patient Details Name: Traci Carter MRN: [id number] DOB: 23-Nov-1945 Today's Date: 01/16/2021  History of Present Illness  75 year old female with history of DM, HTN, gout, A. fib on Eliquis, CVA on Keppra, fibromyalgia, who was brought to the ED with a 2-day history of  confusion and altered mental status and high blood sugars, that was preceded by a several day history of increasing left foot pain from her gout causing her to spend most of the day in bed.   Clinical Impression  Pt is a pleasant 74 year old female who presents to PT evaluation after admission for altered mental status and L foot gout flare up. Pt with some confusion this morning regarding home set up and prior level of function but reports that she was ambulatory in the home with devices. Upon evaluation, pt able to complete bed mobility at mod I level, transfers with min assist, and able to take side steps at EOB + RW and antalgic gait. Pt limited by pain in L foot during out of bed mobility tasks during evaluation 2/2 to gout flare up. Recommending home with HHPT + 24/7 assistance + usage of RW for gait due to increased L foot pain. Will continue to see patient during admission to promote out of bed mobility and assistive device training for patient safety.     Recommendations for follow up therapy are one component of a multi-disciplinary discharge planning process, led by the attending physician.  Recommendations may be updated based on patient status, additional functional criteria and insurance authorization.  Follow Up Recommendations Home health PT;Supervision/Assistance - 24 hour    Equipment Recommendations  None recommended by PT    Recommendations for Other Services       Precautions / Restrictions Restrictions Weight Bearing Restrictions: No      Mobility  Bed Mobility Overal bed mobility: Modified Independent             General bed mobility comments: Usage of bed rails to  come to sitting    Transfers Overall transfer level: Needs assistance Equipment used: Rolling walker (2 wheeled) Transfers: Sit to/from Stand Sit to Stand: Min assist         General transfer comment: MinA for sit to stand transfer. Pt limited by incraesed L foot pain 2/2 to gout flare up  Ambulation/Gait Ambulation/Gait assistance: Min guard Gait Distance (Feet): 2 Feet Assistive device: Rolling walker (2 wheeled) Gait Pattern/deviations: Step-to pattern;Decreased stance time - left     General Gait Details: Pt able to take side steps at the EOB with RW. Pt demonstrates antalgic gait due to L foot gout flare up.  Stairs            Wheelchair Mobility    Modified Rankin (Stroke Patients Only)       Balance Overall balance assessment: Needs assistance Sitting-balance support: No upper extremity supported;Feet supported Sitting balance-Leahy Scale: Good     Standing balance support: Bilateral upper extremity supported;During functional activity Standing balance-Leahy Scale: Fair Standing balance comment: Requiring B UE assist to maintain balance due to increased pain in L foot             Pertinent Vitals/Pain Pain Assessment: No/denies pain    Home Living Family/patient expects to be discharged to:: Private residence Living Arrangements: Spouse/significant other;Children Available Help at Discharge: Family;Available 24 hours/day             Additional Comments: Pt with some mild confusion regarding home set up during evaluation  Prior Function Level of Independence: Independent with assistive device(s)         Comments: Pt reports that she has a RW, rollator, and SPC and uses them dependening on flare up of gout     Hand Dominance        Extremity/Trunk Assessment   Upper Extremity Assessment Upper Extremity Assessment: Defer to OT evaluation    Lower Extremity Assessment Lower Extremity Assessment: Overall WFL for tasks  assessed;Difficult to assess due to impaired cognition (Decreased understanding of formal MMT)       Communication   Communication: No difficulties  Cognition Arousal/Alertness: Awake/alert Behavior During Therapy: WFL for tasks assessed/performed Overall Cognitive Status: No family/caregiver present to determine baseline cognitive functioning                                 General Comments: Pt disoriented to time upon evaluation; mildly confused regarding home set up and prior level of function      General Comments      Exercises     Assessment/Plan    PT Assessment Patient needs continued PT services  PT Problem List Decreased strength;Decreased activity tolerance;Decreased mobility;Pain;Decreased range of motion;Decreased balance       PT Treatment Interventions DME instruction;Gait training;Stair training;Functional mobility training;Therapeutic activities;Therapeutic exercise;Balance training;Neuromuscular re-education;Patient/family education    PT Goals (Current goals can be found in the Care Plan section)  Acute Rehab PT Goals Patient Stated Goal: to decrase her gout flare ups and manage pain PT Goal Formulation: With patient Time For Goal Achievement: 01/30/21 Potential to Achieve Goals: Fair    Frequency Min 2X/week   Barriers to discharge        Co-evaluation               AM-PAC PT "6 Clicks" Mobility  Outcome Measure Help needed turning from your back to your side while in a flat bed without using bedrails?: None Help needed moving from lying on your back to sitting on the side of a flat bed without using bedrails?: None Help needed moving to and from a bed to a chair (including a wheelchair)?: A Lot Help needed standing up from a chair using your arms (e.g., wheelchair or bedside chair)?: A Little Help needed to walk in hospital room?: A Lot Help needed climbing 3-5 steps with a railing? : Total 6 Click Score: 16    End of  Session Equipment Utilized During Treatment: Gait belt Activity Tolerance: Patient limited by pain Patient left: in bed;with call bell/phone within reach;with bed alarm set Nurse Communication: Mobility status PT Visit Diagnosis: Unsteadiness on feet (R26.81);Muscle weakness (generalized) (M62.81);Pain Pain - Right/Left: Left Pain - part of body: Ankle and joints of foot    Time: 7654-6503 PT Time Calculation (min) (ACUTE ONLY): 24 min   Charges:   PT Evaluation $PT Eval Low Complexity: [place], SPT   Andrey Campanile 01/16/2021, 12:01 PM

## 2021-01-17 DIAGNOSIS — G9341 Metabolic encephalopathy: Secondary | ICD-10-CM | POA: Diagnosis not present

## 2021-01-17 DIAGNOSIS — M10072 Idiopathic gout, left ankle and foot: Secondary | ICD-10-CM | POA: Diagnosis not present

## 2021-01-17 DIAGNOSIS — E1165 Type 2 diabetes mellitus with hyperglycemia: Secondary | ICD-10-CM | POA: Diagnosis not present

## 2021-01-17 DIAGNOSIS — N3000 Acute cystitis without hematuria: Secondary | ICD-10-CM | POA: Diagnosis not present

## 2021-01-17 LAB — GLUCOSE, CAPILLARY
Glucose-Capillary: 129 mg/dL — ABNORMAL HIGH (ref 70–99)
Glucose-Capillary: 128 mg/dL — ABNORMAL HIGH (ref 70–99)
Glucose-Capillary: 133 mg/dL — ABNORMAL HIGH (ref 70–99)
Glucose-Capillary: 91 mg/dL (ref 70–99)
Glucose-Capillary: 123 mg/dL — ABNORMAL HIGH (ref 70–99)

## 2021-01-17 LAB — URINE CULTURE: Culture: >=100000 — AB

## 2021-01-17 MED ORDER — CEFAZOLIN SODIUM-DEXTROSE 1-4 GM/50ML-% IV SOLN
1.0000 g | Freq: Three times a day (TID) | INTRAVENOUS | Status: DC
  Administered 2021-01-17 – 2021-01-18 (×3): 1 g via INTRAVENOUS
  Filled 2021-01-17 (×4): qty 50

## 2021-01-17 NOTE — Progress Notes (Signed)
PT Cancellation Note  Patient Details Name: Kortne All MRN: [id number] DOB: 08-05-1945   Cancelled Treatment:    Reason Eval/Treat Not Completed: Patient declined, no reason specified  Entered patients room to attempt treatment however, pt reporting that she "has nothing to live for and doesn't want to be helped." Attempted to redirect for bed level exercises, however pt continuing to decliner. Provided emotional support and left comfortable in bed. Will re-attempt at later date.   Andrey Campanile, SPT  Andrey Campanile 01/17/2021, 8:08 AM

## 2021-01-17 NOTE — Progress Notes (Signed)
  Progress Note    Traci Carter   IDP:[id number]  DOB: 07/03/1945  DOA: 01/14/2021     3 Date of Service: 01/17/2021   Clinical Course 75 year old woman presented with increasing confusion and decreased mobility from gout in the left foot. --10/13 admitted for acute metabolic encephalopathy, UTI --10/14 somewhat better, continue antibiotics for UTI, colchicine for gout.  PT consultation. --10/15 mildly more confused today, thinks she is at school, overall however seems to be improving, blood sugar somewhat labile.  Possibly home next 1 to 2 days. --10/16, less confusion today, blood sugars improved, mentation improving, culture pending, anticipate home 10/17  Assessment and Plan * Acute metabolic encephalopathy -- Less confused today.  Overall improving.  Continue ceftriaxone.  UTI (urinary tract infection) -- Clinically better today, culture growing E. coli, await sensitivities, continue empiric ceftriaxone  Hyperglycemia due to type 2 diabetes mellitus (HCC) -- CBG well controlled now, continue Glucotrol, can resume metformin on discharge.  Continue sliding scale insulin.  Gout flare -- Recently started allopurinol about 1 to 2 weeks ago, will continue  -- Exam significant for findings consistent with gout left first toe greater than right toe.  Has difficulty tolerating colchicine as an outpatient secondary to diarrhea.  Tolerating here.  Avoid prednisone given hyperglycemia. -- Stable  Acquired thrombophilia ([place]) --secondary to afib, continue apixaban  Atrial fibrillation, chronic (HCC) -- Stable, continue apixaban, not on rate control agent   Subjective:  Feels better today.  "I will have to live with my gout".  Objective Vitals:   01/16/21 2138 01/17/21 0524 01/17/21 0828 01/17/21 1128  BP: (!) 149/83 (!) 150/84 (!) 144/84 (!) 154/95  Pulse: 91 91 97 88  Resp: 18 18 18 16   Temp: 98.2 F (36.8 C) 97.7 F (36.5 C) 97.7 F (36.5 C) 97.7 F (36.5 C)  TempSrc:  Axillary Oral    SpO2: 98% 97% 98% 98%  Weight:      Height:       81.6 kg  Vital signs were reviewed and unremarkable.  Exam Physical Exam Constitutional:      General: She is not in acute distress.    Appearance: She is not ill-appearing or toxic-appearing.  Cardiovascular:     Rate and Rhythm: Normal rate and regular rhythm.     Heart sounds: No murmur heard. Pulmonary:     Effort: Pulmonary effort is normal. No respiratory distress.     Breath sounds: No wheezing or rales.  Skin:    Comments: No change in podagra bilaterally  Neurological:     Mental Status: She is alert.     Comments: Mildly confused "hospital, September", but better than yesterday  Psychiatric:        Mood and Affect: Mood normal.        Behavior: Behavior normal.    Labs / Other Information My review of labs, imaging, notes and other tests is significant for    blood culture no growth to date, urine culture E. coli greater than 100,000 colonies, sensitivities pending  Disposition Plan: Status is: Inpatient  Remains inpatient appropriate because: Confusion, awaiting culture data for UTI, likely home tomorrow  Updated daughter by telephone, anticipate slow clearing of mentation, but discharge 10/17.  Full code Apixaban  Time spent: 25 minutes [facility] 01/17/2021, 11:38 AM

## 2021-01-17 NOTE — Progress Notes (Signed)
Patients information sent to [place] for review.

## 2021-01-17 NOTE — Progress Notes (Signed)
[facility] unable to take patient

## 2021-01-17 NOTE — Progress Notes (Signed)
CSW contacted [facility] and they are unable to take patient for home health services.

## 2021-01-17 NOTE — Progress Notes (Signed)
[facility] unable to take patients insurance

## 2021-01-17 NOTE — Progress Notes (Signed)
CSW spoke with patient to let her know that home health was recommended. CSW let patient know that it might be difficult to find an agency willing to accept her due to her insurance. Patient agreeable to home health services. Patient stated she has DME equipment at home.

## 2021-01-17 NOTE — Progress Notes (Signed)
CSW contacted [facility] and left a message with Maudie Mercury. CSW attempted to call Janeece Riggers but phone goes straight to voicemail.

## 2021-01-17 NOTE — Progress Notes (Signed)
[facility] unable to take patient for home health services

## 2021-01-18 DIAGNOSIS — N3000 Acute cystitis without hematuria: Secondary | ICD-10-CM | POA: Diagnosis not present

## 2021-01-18 DIAGNOSIS — G9341 Metabolic encephalopathy: Secondary | ICD-10-CM | POA: Diagnosis not present

## 2021-01-18 DIAGNOSIS — M10072 Idiopathic gout, left ankle and foot: Secondary | ICD-10-CM | POA: Diagnosis not present

## 2021-01-18 DIAGNOSIS — D6869 Other thrombophilia: Secondary | ICD-10-CM | POA: Diagnosis not present

## 2021-01-18 LAB — HEMOGLOBIN A1C
Hgb A1c MFr Bld: 11.5 % — ABNORMAL HIGH (ref 4.8–5.6)
Hgb A1c MFr Bld: 11.5 % — ABNORMAL HIGH (ref 4.8–5.6)
Mean Plasma Glucose: 283 mg/dL
Mean Plasma Glucose: 283 mg/dL

## 2021-01-18 LAB — CBC
HCT: 36.5 % (ref 36.0–46.0)
Hemoglobin: 12 g/dL (ref 12.0–15.0)
MCH: 29.9 pg (ref 26.0–34.0)
MCHC: 32.9 g/dL (ref 30.0–36.0)
MCV: 90.8 fL (ref 80.0–100.0)
Platelets: 404 K/uL — ABNORMAL HIGH (ref 150–400)
RBC: 4.02 MIL/uL (ref 3.87–5.11)
RDW: 12.9 % (ref 11.5–15.5)
WBC: 10.1 K/uL (ref 4.0–10.5)
nRBC: 0 % (ref 0.0–0.2)

## 2021-01-18 LAB — GLUCOSE, CAPILLARY
Glucose-Capillary: 108 mg/dL — ABNORMAL HIGH (ref 70–99)
Glucose-Capillary: 176 mg/dL — ABNORMAL HIGH (ref 70–99)
Glucose-Capillary: 144 mg/dL — ABNORMAL HIGH (ref 70–99)

## 2021-01-18 MED ORDER — CEPHALEXIN 500 MG PO CAPS: 500.0000 mg | ORAL_CAPSULE | Freq: Three times a day (TID) | ORAL | 0 refills | Status: DC

## 2021-01-18 MED ORDER — CEPHALEXIN 500 MG PO CAPS
500.0000 mg | ORAL_CAPSULE | Freq: Three times a day (TID) | ORAL | Status: DC
  Administered 2021-01-18: 500 mg via ORAL
  Filled 2021-01-18: qty 1

## 2021-01-18 NOTE — TOC Progression Note (Signed)
Transition of Care [facility]) - Progression Note    Patient Details  Name: Traci Carter MRN: [id number] Date of Birth: 29-May-1945  Transition of Care [facility]) CM/SW [place], RN Phone Number: 01/18/2021, 9:21 AM  Clinical Narrative:   Spoke with the patient's daughter Adonis Brook , she confirmed that the patient's doctor is Benita Stabile         Expected Discharge Plan and Services                                                 Social Determinants of Health (SDOH) Interventions    Readmission Risk Interventions No flowsheet data found.

## 2021-01-18 NOTE — Discharge Summary (Addendum)
Physician Discharge Summary   Patient name: Traci Carter  Admit date:     01/14/2021  Discharge date: 01/18/2021  Discharge Physician: Murray Hodgkins   PCP: Albina Billet, MD   Recommendations at discharge:  Gout.  See below. Daughter unaware why patient on Keppra, cannot find diagnosis for this medication.  Does not need to be continued?  Defer to PCP.  Discharge Diagnoses Principal Problem:   Acute metabolic encephalopathy Active Problems:   HTN (hypertension)   History of CVA (cerebrovascular accident)   UTI (urinary tract infection)   Hyperglycemia due to type 2 diabetes mellitus (HCC)   Gout flare   Atrial fibrillation, chronic ([place])   Acquired thrombophilia [facility])  Hospital Course   75 year old woman presented with increasing confusion and decreased mobility from gout in the left foot. --10/13 admitted for acute metabolic encephalopathy, UTI --10/14 somewhat better, continue antibiotics for UTI, colchicine for gout.  PT consultation. --10/15 mildly more confused today, thinks she is at school, overall however seems to be improving, blood sugar somewhat labile.  Possibly home next 1 to 2 days. --10/16, less confusion today, blood sugars improved, mentation improving, culture pending, anticipate home 10/17 --10/17 improved.  Home today on oral antibiotics.  * Acute metabolic encephalopathy -- Secondary to UTI.  Continues to improve, probably very close to baseline.  Complete treatment for UTI.  UTI (urinary tract infection) -- Appears resolved, will complete total 7 days, change to oral   Hyperglycemia due to type 2 diabetes mellitus (HCC) -- CBG stable, wonder if she has been compliant with her medications as an outpatient.  Daughter will be assisting with this from now on.  Resume oral medications on discharge.  Gout flare -- Recently started allopurinol as outpatient -- Exam significant for findings consistent with gout left first toe greater than right toe.  Has  difficulty tolerating colchicine as an outpatient secondary to diarrhea by report although she tolerated it here.  We will continue on discharge.  Try to avoid prednisone given diabetes.    Acquired thrombophilia ([place]) --secondary to afib, continue apixaban  Atrial fibrillation, chronic (HCC) -- Stable, continue apixaban, not on rate control agent  Updated daughter by telephone   Procedures performed: none   Condition at discharge: good  Exam Physical Exam Vitals reviewed.  Constitutional:      General: She is not in acute distress.    Appearance: She is not ill-appearing or toxic-appearing.  Cardiovascular:     Rate and Rhythm: Normal rate and regular rhythm.  Pulmonary:     Effort: No respiratory distress.     Breath sounds: No wheezing, rhonchi or rales.  Musculoskeletal:     Right lower leg: No edema.     Left lower leg: No edema.  Skin:    Comments: No change in bilateral erythema great toes  Neurological:     Mental Status: She is alert.  Psychiatric:        Mood and Affect: Mood normal.        Behavior: Behavior normal.     Comments: Mildly confused to month and year.    Disposition: Home health  Discharge time: greater than 30 minutes.  Allergies as of 01/18/2021       Reactions   Codeine    Lanoxin [digoxin]    Penicillins    Zoloft [sertraline Hcl]         Medication List     TAKE these medications    albuterol 108 (90 Base) MCG/ACT inhaler  Commonly known as: VENTOLIN HFA Inhale 2 puffs into the lungs every 6 (six) hours as needed for wheezing or shortness of breath.   allopurinol 100 MG tablet Commonly known as: ZYLOPRIM Take 100 mg by mouth daily.   atorvastatin 20 MG tablet Commonly known as: LIPITOR Take 20 mg by mouth daily.   cephALEXin 500 MG capsule Commonly known as: KEFLEX Take 1 capsule (500 mg total) by mouth every 8 (eight) hours.   colchicine 0.6 MG tablet Take 0.6 mg by mouth 2 (two) times daily.   DULoxetine 60 MG  capsule Commonly known as: CYMBALTA Take 60 mg by mouth daily.   Eliquis 5 MG Tabs tablet Generic drug: apixaban Take 5 mg by mouth 2 (two) times daily.   famotidine 20 MG tablet Commonly known as: PEPCID Take 20 mg by mouth 2 (two) times daily.   glipiZIDE 5 MG 24 hr tablet Commonly known as: GLUCOTROL XL Take 5 mg by mouth daily.   levETIRAcetam 1000 MG tablet Commonly known as: KEPPRA Take 1,000 mg by mouth 2 (two) times daily.   lisinopril 5 MG tablet Commonly known as: ZESTRIL Take 5 mg by mouth daily.   metFORMIN 1000 MG tablet Commonly known as: GLUCOPHAGE Take 1,000 mg by mouth 2 (two) times daily.   zolpidem 10 MG tablet Commonly known as: AMBIEN Take 10 mg by mouth at bedtime.        CT HEAD WO CONTRAST (5MM)  Result Date: 01/14/2021 CLINICAL DATA:  Mental status change, unknown cause, hyperglycemia EXAM: CT HEAD WITHOUT CONTRAST TECHNIQUE: Contiguous axial images were obtained from the base of the skull through the vertex without intravenous contrast. COMPARISON:  None. FINDINGS: Brain: No evidence of parenchymal hemorrhage or extra-axial fluid collection. No mass lesion, mass effect, or midline shift. No CT evidence of acute infarction. Nonspecific mild to moderate subcortical and periventricular white matter hypodensity, most in keeping with chronic small vessel ischemic change. Generalized cerebral volume loss. No ventriculomegaly. Vascular: No acute abnormality. Skull: No evidence of calvarial fracture. Sinuses/Orbits: The visualized paranasal sinuses are essentially clear. Other:  The mastoid air cells are unopacified. IMPRESSION: 1. No evidence of acute intracranial abnormality. 2. Generalized cerebral volume loss and mild-to-moderate chronic small vessel ischemic changes in the cerebral white matter. Electronically Signed   By: Ilona Sorrel M.D.   On: 01/14/2021 17:02   DG Chest Portable 1 View  Result Date: 01/14/2021 CLINICAL DATA:  Altered mental  status, hyperglycemia EXAM: PORTABLE CHEST 1 VIEW COMPARISON:  None. FINDINGS: Two lead left subclavian pacemaker with lead tips overlying the right atrium and right ventricle. Top normal heart size. Normal mediastinal contour. No pneumothorax. No pleural effusion. Low lung volumes. No overt pulmonary edema. No consolidative airspace disease. IMPRESSION: Low lung volumes. No active cardiopulmonary disease. Electronically Signed   By: Ilona Sorrel M.D.   On: 01/14/2021 16:59   Results for orders placed or performed during the hospital encounter of 01/14/21  Urine Culture     Status: Abnormal   Collection Time: 01/14/21  6:28 PM   Specimen: Urine, Random  Result Value Ref Range Status   Specimen Description   Final    URINE, RANDOM Performed at [facility], [address] [place], [place] [address]    Special Requests   Final    NONE Performed at [facility], [address] [place], [place] [address]    Culture >=100,000 COLONIES/mL ESCHERICHIA COLI (A)  Final   Report Status 01/17/2021 FINAL  Final  Organism ID, Bacteria ESCHERICHIA COLI (A)  Final      Susceptibility   Escherichia coli - MIC*    AMPICILLIN >=32 RESISTANT Resistant     CEFAZOLIN <=4 SENSITIVE Sensitive     CEFEPIME <=0.12 SENSITIVE Sensitive     CEFTRIAXONE <=0.25 SENSITIVE Sensitive     CIPROFLOXACIN <=0.25 SENSITIVE Sensitive     GENTAMICIN 8 INTERMEDIATE Intermediate     IMIPENEM <=0.25 SENSITIVE Sensitive     NITROFURANTOIN <=16 SENSITIVE Sensitive     TRIMETH/SULFA >=320 RESISTANT Resistant     AMPICILLIN/SULBACTAM >=32 RESISTANT Resistant     PIP/TAZO <=4 SENSITIVE Sensitive     * >=100,000 COLONIES/mL ESCHERICHIA COLI  Culture, blood (single)     Status: None (Preliminary result)   Collection Time: 01/14/21 10:06 PM   Specimen: BLOOD  Result Value Ref Range Status   Specimen Description BLOOD LEFT HAND  Final   Special Requests   Final    BOTTLES DRAWN AEROBIC ONLY Blood Culture  adequate volume   Culture   Final    NO GROWTH 4 DAYS Performed at [facility], [address] [place], [place] [address]    Report Status PENDING  Incomplete  Resp Panel by RT-PCR (Flu A&B, Covid) Nasopharyngeal Swab     Status: None   Collection Time: 01/15/21  9:49 AM   Specimen: Nasopharyngeal Swab; Nasopharyngeal(NP) swabs in vial transport medium  Result Value Ref Range Status   SARS Coronavirus 2 by RT PCR NEGATIVE NEGATIVE Final    Comment: (NOTE) SARS-CoV-2 target nucleic acids are NOT DETECTED.  The SARS-CoV-2 RNA is generally detectable in upper respiratory specimens during the acute phase of infection. The lowest concentration of SARS-CoV-2 viral copies this assay can detect is 138 copies/mL. A negative result does not preclude SARS-Cov-2 infection and should not be used as the sole basis for treatment or other patient management decisions. A negative result may occur with  improper specimen collection/handling, submission of specimen other than nasopharyngeal swab, presence of viral mutation(s) within the areas targeted by this assay, and inadequate number of viral copies(<138 copies/mL). A negative result must be combined with clinical observations, patient history, and epidemiological information. The expected result is Negative.  Fact Sheet for Patients:  EntrepreneurPulse.com.au  Fact Sheet for Healthcare Providers:  IncredibleEmployment.be  This test is no t yet approved or cleared by the [place] FDA and  has been authorized for detection and/or diagnosis of SARS-CoV-2 by FDA under an Emergency Use Authorization (EUA). This EUA will remain  in effect (meaning this test can be used) for the duration of the COVID-19 declaration under Section 564(b)(1) of the Act, 21 U.S.C.section 360bbb-3(b)(1), unless the authorization is terminated  or revoked sooner.       Influenza A by PCR NEGATIVE NEGATIVE Final    Influenza B by PCR NEGATIVE NEGATIVE Final    Comment: (NOTE) The Xpert Xpress SARS-CoV-2/FLU/RSV plus assay is intended as an aid in the diagnosis of influenza from Nasopharyngeal swab specimens and should not be used as a sole basis for treatment. Nasal washings and aspirates are unacceptable for Xpert Xpress SARS-CoV-2/FLU/RSV testing.  Fact Sheet for Patients: EntrepreneurPulse.com.au  Fact Sheet for Healthcare Providers: IncredibleEmployment.be  This test is not yet approved or cleared by the [place] FDA and has been authorized for detection and/or diagnosis of SARS-CoV-2 by FDA under an Emergency Use Authorization (EUA). This EUA will remain in effect (meaning this test can be used) for the duration of the COVID-19 declaration  under Section 564(b)(1) of the Act, 21 U.S.C. section 360bbb-3(b)(1), unless the authorization is terminated or revoked.  Performed at [facility], [address] [place], [place] [address]     Signed:  Murray Hodgkins MD.  [facility] 01/18/2021, 1:27 PM

## 2021-01-18 NOTE — TOC Progression Note (Signed)
Transition of Care [facility]) - Progression Note    Patient Details  Name: Traci Carter MRN: [id number] Date of Birth: [birth year]  Transition of Care [facility]) CM/SW [place], RN Phone Number: 01/18/2021, 10:59 AM  Clinical Narrative:    [facility] has accepted the patient        Expected Discharge Plan and Services                                                 Social Determinants of Health (SDOH) Interventions    Readmission Risk Interventions No flowsheet data found.

## 2021-01-18 NOTE — Plan of Care (Signed)
Patient discharged home per MD orders at this time.All discharge instructions,education and medications reviewed with patient at the bedside.Pt expressed understanding and will comply with dc instructions.follow up appointments was also communicated to Pt.no verbal c/o or any ssx of distress.Pt was discharged home with HH/PT/OT services per order.patient was transported by [facility] transport on a stretcher.

## 2021-01-18 NOTE — Progress Notes (Signed)
Inpatient Diabetes Program Recommendations  AACE/ADA: New Consensus Statement on Inpatient Glycemic Control (2015)  Target Ranges:  Prepandial:   less than 140 mg/dL      Peak postprandial:   less than 180 mg/dL (1-2 hours)      Critically ill patients:  140 - 180 mg/dL   Lab Results  Component Value Date   GLUCAP 108 (H) 01/18/2021   HGBA1C 11.5 (H) 01/17/2021    Review of Glycemic Control  Diabetes history: DM 2 Outpatient Diabetes medications: Glipizide 5 QD + Metformin 1000 BID  Current orders for Inpatient glycemic control:  Glipizide 5 mg Daily Novolog 0-15 units tid + hs  A1c 11.5% on 10/16  Spoke w/pt at bedside regarding A1c level of 11.5% and glucose control at home. Pt reports needing to be on steroids at home due to gout flare up. Pt did report when checking glucose at home the trends were higher. Spoke with pt in times steroids are needed and glucose trends are above 200 to call her PCP and get additional medication to control her glucose levels. Importance of glucose control reviewed  Thanks,  Tama Headings RN, MSN, BC-ADM Inpatient Diabetes Coordinator Team Pager [phone] (8a-5p)

## 2021-01-18 NOTE — Care Management Important Message (Signed)
Important Message  Patient Details  Name: Traci Carter MRN: [id number] Date of Birth: Oct 25, 1945   Medicare Important Message Given:  Yes     Loann Quill 01/18/2021, 2:01 PM

## 2021-01-18 NOTE — TOC Progression Note (Signed)
Transition of Care [facility]) - Progression Note    Patient Details  Name: Traci Carter MRN: [id number] Date of Birth: 04-14-1945  Transition of Care [facility]) CM/SW [place], RN Phone Number: 01/18/2021, 4:00 PM  Clinical Narrative:    Met with the patient in the room also her husband and son, I explained that [place] is set up, they stated that she will need EMS to transport home,  I called [facility] to [place] She is unable to stand       Expected Discharge Plan and Services           Expected Discharge Date: 01/18/21                                     Social Determinants of Health (SDOH) Interventions    Readmission Risk Interventions No flowsheet data found.

## 2021-01-19 ENCOUNTER — Encounter: Payer: Self-pay | Admitting: Family Medicine

## 2021-01-19 ENCOUNTER — Ambulatory Visit: Payer: Medicare Other | Admitting: Podiatry

## 2021-01-19 LAB — CULTURE, BLOOD (SINGLE)
Culture: NO GROWTH
Special Requests: ADEQUATE

## 2021-02-09 ENCOUNTER — Inpatient Hospital Stay
Admission: EM | Admit: 2021-02-09 | Discharge: 2021-02-11 | DRG: 917 | Disposition: A | Discharge status: Home Health | Payer: Medicare Other | Attending: Internal Medicine | Admitting: Internal Medicine

## 2021-02-09 ENCOUNTER — Emergency Department: Payer: Medicare Other

## 2021-02-09 ENCOUNTER — Other Ambulatory Visit: Payer: Self-pay

## 2021-02-09 DIAGNOSIS — G9341 Metabolic encephalopathy: Secondary | ICD-10-CM | POA: Diagnosis not present

## 2021-02-09 DIAGNOSIS — R7989 Other specified abnormal findings of blood chemistry: Secondary | ICD-10-CM

## 2021-02-09 DIAGNOSIS — I4819 Other persistent atrial fibrillation: Secondary | ICD-10-CM | POA: Diagnosis present

## 2021-02-09 DIAGNOSIS — G928 Other toxic encephalopathy: Secondary | ICD-10-CM | POA: Diagnosis present

## 2021-02-09 DIAGNOSIS — Z794 Long term (current) use of insulin: Secondary | ICD-10-CM

## 2021-02-09 DIAGNOSIS — T426X1A Poisoning by other antiepileptic and sedative-hypnotic drugs, accidental (unintentional), initial encounter: Principal | ICD-10-CM | POA: Diagnosis present

## 2021-02-09 DIAGNOSIS — M10079 Idiopathic gout, unspecified ankle and foot: Secondary | ICD-10-CM | POA: Diagnosis not present

## 2021-02-09 DIAGNOSIS — Z8673 Personal history of transient ischemic attack (TIA), and cerebral infarction without residual deficits: Secondary | ICD-10-CM | POA: Diagnosis not present

## 2021-02-09 DIAGNOSIS — Z7901 Long term (current) use of anticoagulants: Secondary | ICD-10-CM | POA: Diagnosis not present

## 2021-02-09 DIAGNOSIS — E1169 Type 2 diabetes mellitus with other specified complication: Secondary | ICD-10-CM | POA: Diagnosis present

## 2021-02-09 DIAGNOSIS — Z7984 Long term (current) use of oral hypoglycemic drugs: Secondary | ICD-10-CM

## 2021-02-09 DIAGNOSIS — R739 Hyperglycemia, unspecified: Secondary | ICD-10-CM

## 2021-02-09 DIAGNOSIS — G40909 Epilepsy, unspecified, not intractable, without status epilepticus: Secondary | ICD-10-CM | POA: Diagnosis present

## 2021-02-09 DIAGNOSIS — E785 Hyperlipidemia, unspecified: Secondary | ICD-10-CM | POA: Diagnosis present

## 2021-02-09 DIAGNOSIS — Z79899 Other long term (current) drug therapy: Secondary | ICD-10-CM | POA: Diagnosis not present

## 2021-02-09 DIAGNOSIS — E876 Hypokalemia: Secondary | ICD-10-CM | POA: Diagnosis present

## 2021-02-09 DIAGNOSIS — Z20822 Contact with and (suspected) exposure to covid-19: Secondary | ICD-10-CM | POA: Diagnosis present

## 2021-02-09 DIAGNOSIS — M797 Fibromyalgia: Secondary | ICD-10-CM | POA: Diagnosis present

## 2021-02-09 DIAGNOSIS — N1831 Chronic kidney disease, stage 3a: Secondary | ICD-10-CM | POA: Diagnosis present

## 2021-02-09 DIAGNOSIS — N390 Urinary tract infection, site not specified: Secondary | ICD-10-CM | POA: Diagnosis present

## 2021-02-09 DIAGNOSIS — I1310 Hypertensive heart and chronic kidney disease without heart failure, with stage 1 through stage 4 chronic kidney disease, or unspecified chronic kidney disease: Secondary | ICD-10-CM | POA: Diagnosis present

## 2021-02-09 DIAGNOSIS — E1122 Type 2 diabetes mellitus with diabetic chronic kidney disease: Secondary | ICD-10-CM | POA: Diagnosis present

## 2021-02-09 DIAGNOSIS — N183 Chronic kidney disease, stage 3 unspecified: Secondary | ICD-10-CM | POA: Diagnosis present

## 2021-02-09 DIAGNOSIS — M109 Gout, unspecified: Secondary | ICD-10-CM | POA: Diagnosis present

## 2021-02-09 DIAGNOSIS — R4182 Altered mental status, unspecified: Secondary | ICD-10-CM | POA: Diagnosis present

## 2021-02-09 DIAGNOSIS — I10 Essential (primary) hypertension: Secondary | ICD-10-CM | POA: Diagnosis present

## 2021-02-09 DIAGNOSIS — I2510 Atherosclerotic heart disease of native coronary artery without angina pectoris: Secondary | ICD-10-CM | POA: Diagnosis present

## 2021-02-09 DIAGNOSIS — I482 Chronic atrial fibrillation, unspecified: Secondary | ICD-10-CM | POA: Diagnosis present

## 2021-02-09 DIAGNOSIS — Z90710 Acquired absence of both cervix and uterus: Secondary | ICD-10-CM

## 2021-02-09 DIAGNOSIS — A419 Sepsis, unspecified organism: Secondary | ICD-10-CM | POA: Diagnosis present

## 2021-02-09 DIAGNOSIS — R5381 Other malaise: Secondary | ICD-10-CM

## 2021-02-09 DIAGNOSIS — E119 Type 2 diabetes mellitus without complications: Secondary | ICD-10-CM

## 2021-02-09 DIAGNOSIS — E1165 Type 2 diabetes mellitus with hyperglycemia: Secondary | ICD-10-CM | POA: Diagnosis present

## 2021-02-09 LAB — COMPREHENSIVE METABOLIC PANEL
ALT: 14 U/L (ref 0–44)
AST: 22 U/L (ref 15–41)
Albumin: 2.5 g/dL — ABNORMAL LOW (ref 3.5–5.0)
Alkaline Phosphatase: 95 U/L (ref 38–126)
Anion gap: 13 (ref 5–15)
BUN: 23 mg/dL (ref 8–23)
CO2: 30 mmol/L (ref 22–32)
Calcium: 6.7 mg/dL — ABNORMAL LOW (ref 8.9–10.3)
Chloride: 91 mmol/L — ABNORMAL LOW (ref 98–111)
Creatinine, Ser: 1.14 mg/dL — ABNORMAL HIGH (ref 0.44–1.00)
GFR, Estimated: 51 mL/min — ABNORMAL LOW (ref >60)
Glucose, Bld: 512 mg/dL (ref 70–99)
Potassium: 3.3 mmol/L — ABNORMAL LOW (ref 3.5–5.1)
Sodium: 134 mmol/L — ABNORMAL LOW (ref 135–145)
Total Bilirubin: 0.9 mg/dL (ref 0.3–1.2)
Total Protein: 7.8 g/dL (ref 6.5–8.1)

## 2021-02-09 LAB — URINALYSIS, ROUTINE W REFLEX MICROSCOPIC
Bilirubin Urine: NEGATIVE
Glucose, UA: >=500 mg/dL — AB
Ketones, ur: NEGATIVE mg/dL
Nitrite: POSITIVE — AB
Protein, ur: NEGATIVE mg/dL
Specific Gravity, Urine: 1.016 (ref 1.005–1.030)
pH: 5 (ref 5.0–8.0)

## 2021-02-09 LAB — BLOOD GAS, ARTERIAL
Acid-Base Excess: 5.6 mmol/L — ABNORMAL HIGH (ref 0.0–2.0)
Bicarbonate: 29.8 mmol/L — ABNORMAL HIGH (ref 20.0–28.0)
FIO2: 1
O2 Saturation: 99.9 %
Patient temperature: 37
pCO2 arterial: 41 mmHg (ref 32.0–48.0)
pH, Arterial: 7.47 — ABNORMAL HIGH (ref 7.350–7.450)
pO2, Arterial: 306 mmHg — ABNORMAL HIGH (ref 83.0–108.0)

## 2021-02-09 LAB — CBC WITH DIFFERENTIAL/PLATELET
Abs Immature Granulocytes: 0.15 10*3/uL — ABNORMAL HIGH (ref 0.00–0.07)
Basophils Absolute: 0 10*3/uL (ref 0.0–0.1)
Basophils Relative: 0 %
Eosinophils Absolute: 0 10*3/uL (ref 0.0–0.5)
Eosinophils Relative: 0 %
HCT: 35.2 % — ABNORMAL LOW (ref 36.0–46.0)
Hemoglobin: 11.4 g/dL — ABNORMAL LOW (ref 12.0–15.0)
Immature Granulocytes: 1 %
Lymphocytes Relative: 8 %
Lymphs Abs: 1.3 10*3/uL (ref 0.7–4.0)
MCH: 29 pg (ref 26.0–34.0)
MCHC: 32.4 g/dL (ref 30.0–36.0)
MCV: 89.6 fL (ref 80.0–100.0)
Monocytes Absolute: 1.1 10*3/uL — ABNORMAL HIGH (ref 0.1–1.0)
Monocytes Relative: 7 %
Neutro Abs: 13 10*3/uL — ABNORMAL HIGH (ref 1.7–7.7)
Neutrophils Relative %: 84 %
Platelets: 428 10*3/uL — ABNORMAL HIGH (ref 150–400)
RBC: 3.93 MIL/uL (ref 3.87–5.11)
RDW: 13.5 % (ref 11.5–15.5)
WBC: 15.5 10*3/uL — ABNORMAL HIGH (ref 4.0–10.5)
nRBC: 0 % (ref 0.0–0.2)

## 2021-02-09 LAB — RESP PANEL BY RT-PCR (FLU A&B, COVID) ARPGX2
Influenza A by PCR: NEGATIVE
Influenza B by PCR: NEGATIVE
SARS Coronavirus 2 by RT PCR: NEGATIVE

## 2021-02-09 LAB — CBG MONITORING, ED: Glucose-Capillary: 422 mg/dL — ABNORMAL HIGH (ref 70–99)

## 2021-02-09 LAB — LACTIC ACID, PLASMA
Lactic Acid, Venous: 2.3 mmol/L (ref 0.5–1.9)
Lactic Acid, Venous: 1.5 mmol/L (ref 0.5–1.9)

## 2021-02-09 LAB — TROPONIN I (HIGH SENSITIVITY)
Troponin I (High Sensitivity): 28 ng/L — ABNORMAL HIGH (ref <18)
Troponin I (High Sensitivity): 29 ng/L — ABNORMAL HIGH (ref <18)

## 2021-02-09 LAB — GLUCOSE, CAPILLARY: Glucose-Capillary: 376 mg/dL — ABNORMAL HIGH (ref 70–99)

## 2021-02-09 LAB — BRAIN NATRIURETIC PEPTIDE: B Natriuretic Peptide: 220.4 pg/mL — ABNORMAL HIGH (ref 0.0–100.0)

## 2021-02-09 MED ORDER — SODIUM CHLORIDE 0.9 % IV SOLN
2.0000 g | INTRAVENOUS | Status: DC
  Administered 2021-02-09 – 2021-02-10 (×2): 2 g via INTRAVENOUS
  Filled 2021-02-09: qty 20
  Filled 2021-02-09: qty 2
  Filled 2021-02-09: qty 20

## 2021-02-09 MED ORDER — ENOXAPARIN SODIUM 40 MG/0.4ML IJ SOSY
40.0000 mg | PREFILLED_SYRINGE | INTRAMUSCULAR | Status: DC
  Administered 2021-02-09: 40 mg via SUBCUTANEOUS
  Filled 2021-02-09: qty 0.4

## 2021-02-09 MED ORDER — SODIUM CHLORIDE 0.9 % IV SOLN: Freq: Once | INTRAVENOUS | Status: DC

## 2021-02-09 MED ORDER — COLCHICINE 0.6 MG PO TABS
0.6000 mg | ORAL_TABLET | Freq: Two times a day (BID) | ORAL | Status: AC
  Administered 2021-02-09 – 2021-02-10 (×3): 0.6 mg via ORAL
  Filled 2021-02-09 (×3): qty 1

## 2021-02-09 MED ORDER — POTASSIUM CHLORIDE IN NACL 20-0.9 MEQ/L-% IV SOLN
INTRAVENOUS | Status: DC
  Filled 2021-02-09 (×4): qty 1000

## 2021-02-09 MED ORDER — LACTATED RINGERS IV BOLUS (SEPSIS)
250.0000 mL | Freq: Once | INTRAVENOUS | Status: AC
  Administered 2021-02-09: 250 mL via INTRAVENOUS

## 2021-02-09 MED ORDER — LACTATED RINGERS IV SOLN: INTRAVENOUS | Status: DC

## 2021-02-09 MED ORDER — ONDANSETRON HCL 4 MG PO TABS: 4.0000 mg | ORAL_TABLET | Freq: Four times a day (QID) | ORAL | Status: DC | PRN

## 2021-02-09 MED ORDER — INSULIN ASPART 100 UNIT/ML IJ SOLN
0.0000 [IU] | Freq: Every day | INTRAMUSCULAR | Status: DC
  Administered 2021-02-09: 5 [IU] via SUBCUTANEOUS
  Filled 2021-02-09: qty 1

## 2021-02-09 MED ORDER — SODIUM CHLORIDE 0.9 % IV SOLN: 1.0000 g | INTRAVENOUS | Status: DC

## 2021-02-09 MED ORDER — INSULIN ASPART 100 UNIT/ML IJ SOLN
0.0000 [IU] | Freq: Three times a day (TID) | INTRAMUSCULAR | Status: DC
  Administered 2021-02-10 – 2021-02-11 (×4): 4 [IU] via SUBCUTANEOUS
  Administered 2021-02-11: 7 [IU] via SUBCUTANEOUS
  Filled 2021-02-09 (×4): qty 1

## 2021-02-09 MED ORDER — ONDANSETRON HCL 4 MG/2ML IJ SOLN: 4.0000 mg | Freq: Four times a day (QID) | INTRAMUSCULAR | Status: DC | PRN

## 2021-02-09 NOTE — Consult Note (Signed)
CODE SEPSIS - PHARMACY COMMUNICATION  **Broad Spectrum Antibiotics should be administered within 1 hour of Sepsis diagnosis**  Time Code Sepsis Called/Page Received: 1822  Antibiotics Ordered: ceftriaxone  Time of 1st antibiotic administration: 1852  Additional action taken by pharmacy: none   Oswald Hillock ,PharmD Clinical Pharmacist  02/09/2021  6:56 PM

## 2021-02-09 NOTE — H&P (Signed)
History and Physical   Traci Carter QQV:[id number] DOB: [birth year] DOA: 02/09/2021  Referring MD/NP/PA: Dr. Conni Slipper  PCP: Albina Billet, MD   Patient coming from: Home  Chief Complaint: Altered mental status  HPI: Traci Carter is a 75 y.o. 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  She was brought in today by family secondary to altered mental status, hypoglycemia with blood sugar more than 500, no evidence of DKA.  Patient also on narcotics.  Suspicion is for accidental overdose.  Patient not able to communicate.  Son-in-law at bedside reported patient has been out since last night.  No fever or chills.  Urine appears infected.  At this point suspected acute metabolic encephalopathy secondary to accidental Ambien overdose and possible UTI.  She also appears to have ongoing gout flare.  Was recently started on allopurinol but is on colchicine from home.  Patient meets sepsis criteria in general based on leukocytosis and increased heart rate...  ED Course: Temperature is 98.9, blood pressure 120/94, pulse 123 respirate 22 oxygen sats 92% room air.  White count is 15.5 hemoglobin 11.4 platelets 428.  Sodium is 134, potassium 3.3 and chloride 91.  CO2 30 BUN 23 creatinine 1.14 and calcium 6.7.  Initial troponin is 28 glucose 512 and lactic acid 2.3.  Corrected calcium is 7.9.  Chest x-ray showed no acute findings.  Patient being admitted with altered mental status most likely secondary to UTI versus Ambien overdose.  Family have refused head CT without contrast.  Review of Systems: As per HPI otherwise 10 point review of systems negative.    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   COLONOSCOPY WITH PROPOFOL N/A 06/28/2017   Procedure: COLONOSCOPY WITH PROPOFOL;  Surgeon: Robert Bellow, MD;  Location: [facility] ENDOSCOPY;  Service: Endoscopy;  Laterality: N/A;   ELBOW SURGERY     INSERT / REPLACE / REMOVE PACEMAKER     Dr Saralyn Pilar   JOINT REPLACEMENT  2005   knee   SHOULDER ARTHROSCOPY       reports that she has never smoked. She has never used smokeless tobacco. She reports that she does not drink alcohol and does not use drugs.  Allergies  Allergen Reactions   Codeine Other (See Comments)    GI Distress   Codeine    Lanoxin [Digoxin]    Lanoxin [Digoxin]    Penicillins    Zoloft [Sertraline Hcl]    Penicillins Rash    Family History  Problem Relation Age of Onset   Breast cancer Mother    Prostate cancer Father      Prior to Admission medications   Medication Sig Start Date End Date Taking? Authorizing Provider  acetaminophen (TYLENOL) 325 MG tablet Take 2 tablets (  650 mg total) by mouth every 6 (six) hours as needed for mild pain, headache, fever or moderate pain. 09/21/20   Elgergawy, Silver Huguenin, MD  albuterol (PROVENTIL) (2.5 MG/3ML) 0.083% nebulizer solution Inhale 3 mLs into the lungs every 4 (four) hours as needed for wheezing or shortness of breath. 09/21/20   Elgergawy, Silver Huguenin, MD  albuterol (VENTOLIN HFA) 108 (90 Base) MCG/ACT inhaler Inhale 2 puffs into the lungs every 6 (six) hours as needed for wheezing or shortness of breath. 08/12/20   [provider]  allopurinol (ZYLOPRIM) 100 MG  tablet Take 100 mg by mouth daily. 11/28/20   [provider]  apixaban (ELIQUIS) 5 MG TABS tablet Take 1 tablet (5 mg total) by mouth 2 (two) times daily. 07/01/17   Robert Bellow, MD  atorvastatin (LIPITOR) 20 MG tablet Take 20 mg by mouth daily.    [provider]  atorvastatin (LIPITOR) 20 MG tablet Take 20 mg by mouth daily.    [provider]  cephALEXin (KEFLEX) 500 MG capsule Take 1 capsule (500 mg total) by mouth every 8 (eight) hours. 01/18/21   Samuella Cota, MD  colchicine 0.6 MG tablet Take 1 tablet (0.6 mg total) by mouth daily as needed (gout). 09/21/20   Elgergawy, Silver Huguenin, MD  colchicine 0.6 MG tablet Take 0.6 mg by mouth 2 (two) times daily. 07/23/20   [provider]  docusate sodium (COLACE) 100 MG capsule Take 1 capsule (100 mg total) by mouth 2 (two) times daily. 10/20/20 10/20/21  Lavina Hamman, MD  DULoxetine (CYMBALTA) 60 MG capsule Take 60 mg by mouth daily. 09/08/20   [provider]  DULoxetine (CYMBALTA) 60 MG capsule Take 60 mg by mouth daily. 12/06/20   [provider]  ELIQUIS 5 MG TABS tablet Take 5 mg by mouth 2 (two) times daily. 01/07/21   [provider]  famotidine (PEPCID) 20 MG tablet Take 20 mg by mouth 2 (two) times daily.    [provider]  furosemide (LASIX) 20 MG tablet Take 1 tablet (20 mg total) by mouth daily as needed (weight gain >3 lbs in 1 day and >5 lbs in 2 days). 10/20/20   Lavina Hamman, MD  glipiZIDE (GLUCOTROL XL) 5 MG 24 hr tablet Take 5 mg by mouth daily. 01/04/21   [provider]  insulin aspart (NOVOLOG) 100 UNIT/ML injection Inject 0-9 Units into the skin 3 (three) times daily with meals. 09/21/20   Elgergawy, Silver Huguenin, MD  levETIRAcetam (KEPPRA) 1000 MG tablet Take 1 tablet (1,000 mg total) by mouth 2 (two) times daily. 10/18/12   Philmore Pali, NP  levETIRAcetam (KEPPRA) 1000 MG tablet Take 1,000 mg by mouth 2 (two) times daily. 01/08/21   [provider]  lisinopril (PRINIVIL,ZESTRIL) 5 MG tablet Take 1 tablet (5 mg total) by mouth daily. 10/18/12   Philmore Pali, NP  lisinopril (ZESTRIL) 5 MG tablet Take 5 mg by mouth daily. 11/07/20   [provider]  metFORMIN (GLUCOPHAGE) 1000 MG tablet Take 1,000 mg by mouth 2 (two) times daily. 11/28/20   [provider]  metFORMIN (GLUCOPHAGE) 500 MG tablet Take 1 tablet (500 mg total) by mouth 2 (two) times daily with a meal. 10/20/20 10/20/21  Lavina Hamman, MD  methocarbamol (ROBAXIN) 500 MG tablet Take 1 tablet (500 mg total) by mouth 3 (three) times daily. 10/20/20   Lavina Hamman, MD  montelukast (SINGULAIR) 10 MG tablet Take 10 mg  by mouth at bedtime. 11/07/13   [provider]  oxyCODONE-acetaminophen (PERCOCET/ROXICET) 5-325 MG tablet Take 1 tablet by mouth every 8 (eight) hours as needed for moderate pain. 12/15/20   Edrick Kins, DPM  polyethylene glycol (MIRALAX / GLYCOLAX) 17 g packet Take 17 g by mouth 2 (two) times daily. 10/20/20   Lavina Hamman, MD  pregabalin (LYRICA) 50 MG capsule Take 1 capsule (50 mg total) by mouth 2 (two) times daily. 10/20/20 10/20/21  Lavina Hamman, MD  zolpidem (AMBIEN) 10 MG tablet Take 10 mg by mouth at bedtime. 01/08/21   [provider]  zolpidem (AMBIEN) 5 MG tablet Take 1 tablet (5 mg total) by mouth at bedtime as needed for sleep. 10/20/20   Lavina Hamman, MD    Physical Exam: Vitals:   02/09/21 1611 02/09/21 1614 02/09/21 1616 02/09/21 1820  BP:  124/82  (!) 120/94  Pulse:    (!) 123  Resp:  18  (!) 22  Temp:  98.9 F (37.2 C)    TempSrc:  Oral    SpO2:  97% 95% 92%  Weight: 84.8 kg     Height: 5\' 8"  (1.727 m)         Constitutional: Obtunded, mildly responsive Vitals:   02/09/21 1611 02/09/21 1614 02/09/21 1616 02/09/21 1820  BP:  124/82  (!) 120/94  Pulse:    (!) 123  Resp:  18  (!) 22  Temp:  98.9 F (37.2 C)    TempSrc:  Oral    SpO2:  97% 95% 92%  Weight: 84.8 kg     Height: 5\' 8"   (1.727 m)      Eyes: PERRL, lids and conjunctivae normal ENMT: Mucous membranes are dry posterior pharynx clear of any exudate or lesions.Normal dentition.  Neck: normal, supple, no masses, no thyromegaly Respiratory: clear to auscultation bilaterally, no wheezing, no crackles. Normal respiratory effort. No accessory muscle use.  Cardiovascular: Irregularly irregular with tachycardia, no murmurs / rubs / gallops. No extremity edema. 2+ pedal pulses. No carotid bruits.  Abdomen: no tenderness, no masses palpated. No hepatosplenomegaly. Bowel sounds positive.  Musculoskeletal: no clubbing / cyanosis. No joint deformity upper and lower extremities. Good ROM, no contractures. Normal muscle tone.  Skin: no rashes, lesions, ulcers. No induration Neurologic: CN 2-12 grossly intact. Sensation intact, DTR normal. Strength 5/5 in all 4.  Psychiatric: Obtunded, mildly arousable    Labs on Admission: I have personally reviewed following labs and imaging studies  CBC: Recent Labs  Lab 02/09/21 1622  WBC 15.5*  NEUTROABS 13.0*  HGB 11.4*  HCT 35.2*  MCV 89.6  PLT 371*   Basic Metabolic Panel: Recent Labs  Lab 02/09/21 1622  NA 134*  K 3.3*  CL 91*  CO2 30  GLUCOSE 512*  BUN 23  CREATININE 1.14*  CALCIUM 6.7*   GFR: Estimated Creatinine Clearance: 50.2 mL/min (A) (by C-G formula based on SCr of 1.14 mg/dL (H)). Liver Function Tests: Recent Labs  Lab 02/09/21 1622  AST 22  ALT 14  ALKPHOS 95  BILITOT 0.9  PROT 7.8  ALBUMIN 2.5*   No results for input(s): LIPASE, AMYLASE in the last 168 hours. No results for input(s): AMMONIA in the last 168 hours. Coagulation Profile: No results for input(s): INR, PROTIME in the last 168 hours. Cardiac Enzymes: No results for input(s): CKTOTAL, CKMB, CKMBINDEX, TROPONINI in the last 168 hours. BNP (last 3 results) No results for input(s): PROBNP in the last 8760 hours. HbA1C:  No results for input(s): HGBA1C in the last 72  hours. CBG: No results for input(s): GLUCAP in the last 168 hours. Lipid Profile: No results for input(s): CHOL, HDL, LDLCALC, TRIG, CHOLHDL, LDLDIRECT in the last 72 hours. Thyroid Function Tests: No results for input(s): TSH, T4TOTAL, FREET4, T3FREE, THYROIDAB in the last 72 hours. Anemia Panel: No results for input(s): VITAMINB12, FOLATE, FERRITIN, TIBC, IRON, RETICCTPCT in the last 72 hours. Urine analysis:    Component Value Date/Time   COLORURINE YELLOW (A) 01/14/2021 1825   APPEARANCEUR CLOUDY (A) 01/14/2021 1825   APPEARANCEUR Clear 07/21/2012 1003   LABSPEC 1.022 01/14/2021 1825   LABSPEC 1.010 07/21/2012 1003   PHURINE 5.0 01/14/2021 1825   GLUCOSEU >=500 (A) 01/14/2021 1825   GLUCOSEU 50 mg/dL 07/21/2012 1003   HGBUR NEGATIVE 01/14/2021 1825   [place] 01/14/2021 1825   BILIRUBINUR Negative 07/21/2012 1003   KETONESUR NEGATIVE 01/14/2021 1825   PROTEINUR 30 (A) 01/14/2021 1825   NITRITE POSITIVE (A) 01/14/2021 1825   LEUKOCYTESUR SMALL (A) 01/14/2021 1825   LEUKOCYTESUR Negative 07/21/2012 1003   Sepsis Labs: @LABRCNTIP (procalcitonin:4,lacticidven:4) )No results found for this or any previous visit (from the past 240 hour(s)).   Radiological Exams on Admission: DG Chest Portable 1 View  Result Date: 02/09/2021 CLINICAL DATA:  Hypoxia EXAM: PORTABLE CHEST 1 VIEW COMPARISON:  Radiograph 09/16/2020 FINDINGS: Unchanged cardiomediastinal silhouette with dual chamber pacemaker leads. Mild central pulmonary vascular congestion. There is no focal airspace consolidation. There is no large pleural effusion or visible pneumothorax. Bilateral shoulder degenerative changes. No acute osseous abnormality. Thoracic spondylosis. IMPRESSION: Unchanged cardiomegaly. Mild central pulmonary vascular congestion. No focal airspace consolidation. Electronically Signed   By: Maurine Simmering M.D.   On: 02/09/2021 16:50    EKG: Independently reviewed.  Currently  pending  Assessment/Plan Principal Problem:   Ambien accidental overdose, initial encounter Active Problems:   Essential hypertension   Hyperlipidemia associated with type 2 diabetes mellitus (HCC)   CAD (coronary artery disease)   Diabetes mellitus type 2, uncomplicated (HCC)   CKD (chronic kidney disease), stage IIIa   UTI (urinary tract infection)   Acute metabolic encephalopathy   Gout flare   Atrial fibrillation, chronic (HCC)   Hypokalemia   Hypocalcemia     #1 accidental Ambien overdose: Patient was restarted on Ambien for sleep.  At her age and previous overdose she should probably never been on this drug again.  We will stop it in the hospital.  Admit the patient and observe closely on telemetry.  Monitor vital signs closely.  Resume home regimen for A. fib and RVR.  #2 acute metabolic encephalopathy: Suspected Ambien overdose with UTI.  Patient recently had similar presentation.  We will treat for UTI as well as manage the Ambien overdose.  #3 UTI: Initiate Rocephin.  Patient was recently in the hospital so this could be complex healthcare associated UTI.  Initiate Rocephin and if cultures dictate change antibiotics.  #4 hypokalemia: We will replete.  #5 mild hypocalcemia: Replete calcium.  Corrected calcium actually 7.9.  #6 diabetes: Sliding scale insulin.  Patient has known insulin-dependent diabetes.  #7 chronic kidney disease stage IIIa: Appears to be close to baseline.  Continue to monitor  #8 atrial fibrillation with rapid response: Resume home regimen when able.  In the meantime IV  Lopressor for rate control.  #9 acute gouty flare: Continue with colchicine as tolerated.  May need to do steroids if unable to take colchicine.  #10 essential hypertension: Patient unable to take oral  medications at this point.  Resume home regimen when fully awake.  #11 hyperlipidemia: Resume statin when patient is fully awake.   DVT prophylaxis: Lovenox Code Status: Full  code Family Communication: Son-in-law at bedside Disposition Plan: Home Consults called: None Admission status: Inpatient  Severity of Illness: The appropriate patient status for this patient is INPATIENT. Inpatient status is judged to be reasonable and necessary in order to provide the required intensity of service to ensure the patient's safety. The patient's presenting symptoms, physical exam findings, and initial radiographic and laboratory data in the context of their chronic comorbidities is felt to place them at high risk for further clinical deterioration. Furthermore, it is not anticipated that the patient will be medically stable for discharge from the hospital within 2 midnights of admission.   * I certify that at the point of admission it is my clinical judgment that the patient will require inpatient hospital care spanning beyond 2 midnights from the point of admission due to high intensity of service, high risk for further deterioration and high frequency of surveillance required.Barbette Merino MD [facility] Pager [phone]  If 7PM-7AM, please contact night-coverage [email or web address] Password [facility]  02/09/2021, 6:36 PM

## 2021-02-09 NOTE — ED Triage Notes (Signed)
Pt to ED via EMS from home. Per EMS, pt has been weak all day. She tried to get out of bed and fell. Pt is confused upon arrival. Pt CBG 550. Pt also 80% RA. Pt placed on 3L.

## 2021-02-09 NOTE — Consult Note (Signed)
PHARMACY -  BRIEF ANTIBIOTIC NOTE   Pharmacy has received consult(s) for sepsis from an ED provider.  The patient's profile has been reviewed for ht/wt/allergies/indication/available labs.    One time order(s) placed for ceftriaxone  Further antibiotics/pharmacy consults should be ordered by admitting physician if indicated.                       Thank you, Oswald Hillock 02/09/2021  6:41 PM

## 2021-02-09 NOTE — ED Provider Notes (Addendum)
[facility] Emergency Department Provider Note   ____________________________________________   Event Date/Time   First MD Initiated Contact with Patient 02/09/21 1611     (approximate)  I have reviewed the triage vital signs and the nursing notes.   HISTORY  Chief Complaint Hyperglycemia    HPI Traci Carter is a 75 y.o. female brought in by EMS.  EMS reports they were called because the patient had fallen out of her wheelchair without injuring herself and they were called to help her get back in.  When they arrived husband had gotten her into the wheelchair but needed help getting into the bed.  Patient has altered mental status.  This is attributed to her getting Ambien last night.  She has had this problem before when she takes Ambien.  Reportedly her primary care doctor continues to give her Ambien.  Patient had A. fib with a rapid rate.  Patient's O2 sats were bouncing between 80 and 90.  She had strong foul-smelling urine.  Fingerstick glucose was 500.  Patient takes glipizide for her sugar and usually runs 2-300.  History as per EMS as again the patient is altered.        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     Patient Active Problem List   Diagnosis Date Noted   Acquired thrombophilia ([place]) 01/16/2021   UTI (urinary tract infection) 00/76/2263   Acute metabolic encephalopathy 33/54/5625   Gout flare 01/14/2021   Hyperglycemia due to type 2 diabetes mellitus ([place]) 01/14/2021   Atrial fibrillation, chronic ([place]) 01/14/2021   HTN (hypertension) 01/14/2021   History of CVA (cerebrovascular accident) 01/14/2021   Fracture of femoral condyle, right, closed ([place]) 10/17/2020    Generalized weakness 09/16/2020   Type II diabetes mellitus with renal manifestations ([place]) 09/16/2020   CKD (chronic kidney disease), stage IIIa 09/16/2020   Depression with anxiety 09/16/2020   Fall 09/16/2020   UTI (urinary tract infection) 09/16/2020   Seizure ([place]) 09/16/2020   Diabetes mellitus type 2, uncomplicated ([place]) 63/89/3734   Chest pain with low risk for cardiac etiology 12/27/2018   SOB (shortness of breath) on exertion 12/27/2018   Paroxysmal atrial fibrillation ([place]) 10/16/2018   Hyperlipidemia associated with type 2 diabetes mellitus ([place]) 10/16/2018   Pacemaker 10/16/2018   History of CVA (cerebrovascular accident) 10/16/2018   OSA (obstructive sleep apnea) 10/16/2018   Fibromyalgia 10/16/2018   CAD (coronary artery disease) 10/16/2018   Diarrhea 06/09/2017   Hyperlipemia 08/15/2013   ICH (intracerebral hemorrhage) ([place]) 07/21/2012   Diabetes ([place]) 07/21/2012   Essential hypertension 07/21/2012   CVA (cerebral vascular accident) ([place]) 07/20/2012    Past Surgical History:  Procedure Laterality Date   ABDOMINAL HYSTERECTOMY     CARPAL TUNNEL RELEASE     COLONOSCOPY  01/02/2009   Dr Bary Castilla diverticulosis.   COLONOSCOPY WITH PROPOFOL N/A 06/28/2017   Procedure: COLONOSCOPY WITH PROPOFOL;  Surgeon: Robert Bellow, MD;  Location: [facility] ENDOSCOPY;  Service: Endoscopy;  Laterality: N/A;   ELBOW SURGERY     INSERT / REPLACE / REMOVE PACEMAKER     Dr Saralyn Pilar   JOINT REPLACEMENT  2005   knee   SHOULDER ARTHROSCOPY  Prior to Admission medications   Medication Sig Start Date End Date Taking? Authorizing Provider  acetaminophen (TYLENOL) 325 MG tablet Take 2 tablets (650 mg total) by mouth every 6 (six) hours as needed for mild pain, headache, fever or moderate pain. 09/21/20   Elgergawy, Silver Huguenin, MD  albuterol (PROVENTIL) (2.5 MG/3ML) 0.083% nebulizer solution Inhale 3 mLs into the lungs every 4 (four) hours as needed for wheezing or shortness of breath.  09/21/20   Elgergawy, Silver Huguenin, MD  albuterol (VENTOLIN HFA) 108 (90 Base) MCG/ACT inhaler Inhale 2 puffs into the lungs every 6 (six) hours as needed for wheezing or shortness of breath. 08/12/20   [provider]  allopurinol (ZYLOPRIM) 100 MG tablet Take 100 mg by mouth daily. 11/28/20   [provider]  apixaban (ELIQUIS) 5 MG TABS tablet Take 1 tablet (5 mg total) by mouth 2 (two) times daily. 07/01/17   Robert Bellow, MD  atorvastatin (LIPITOR) 20 MG tablet Take 20 mg by mouth daily.    [provider]  atorvastatin (LIPITOR) 20 MG tablet Take 20 mg by mouth daily.    [provider]  cephALEXin (KEFLEX) 500 MG capsule Take 1 capsule (500 mg total) by mouth every 8 (eight) hours. 01/18/21   Samuella Cota, MD  colchicine 0.6 MG tablet Take 1 tablet (0.6 mg total) by mouth daily as needed (gout). 09/21/20   Elgergawy, Silver Huguenin, MD  colchicine 0.6 MG tablet Take 0.6 mg by mouth 2 (two) times daily. 07/23/20   [provider]  docusate sodium (COLACE) 100 MG capsule Take 1 capsule (100 mg total) by mouth 2 (two) times daily. 10/20/20 10/20/21  Lavina Hamman, MD  DULoxetine (CYMBALTA) 60 MG capsule Take 60 mg by mouth daily. 09/08/20   [provider]  DULoxetine (CYMBALTA) 60 MG capsule Take 60 mg by mouth daily. 12/06/20   [provider]  ELIQUIS 5 MG TABS tablet Take 5 mg by mouth 2 (two) times daily. 01/07/21   [provider]  famotidine (PEPCID) 20 MG tablet Take 20 mg by mouth 2 (two) times daily.    [provider]  furosemide (LASIX) 20 MG tablet Take 1 tablet (20 mg total) by mouth daily as needed (weight gain >3 lbs in 1 day and >5 lbs in 2 days). 10/20/20   Lavina Hamman, MD  glipiZIDE (GLUCOTROL XL) 5 MG 24 hr tablet Take 5 mg by mouth daily. 01/04/21   [provider]  insulin aspart (NOVOLOG) 100 UNIT/ML injection Inject 0-9 Units into the skin 3 (three) times daily with meals. 09/21/20    Elgergawy, Silver Huguenin, MD  levETIRAcetam (KEPPRA) 1000 MG tablet Take 1 tablet (1,000 mg total) by mouth 2 (two) times daily. 10/18/12   Philmore Pali, NP  levETIRAcetam (KEPPRA) 1000 MG tablet Take 1,000 mg by mouth 2 (two) times daily. 01/08/21   [provider]  lisinopril (PRINIVIL,ZESTRIL) 5 MG tablet Take 1 tablet (5 mg total) by mouth daily. 10/18/12   Philmore Pali, NP  lisinopril (ZESTRIL) 5 MG tablet Take 5 mg by mouth daily. 11/07/20   [provider]  metFORMIN (GLUCOPHAGE) 1000 MG tablet Take 1,000 mg by mouth 2 (two) times daily. 11/28/20   [provider]  metFORMIN (GLUCOPHAGE) 500 MG tablet Take 1 tablet (500 mg total) by mouth 2 (two) times daily with a meal. 10/20/20 10/20/21  Lavina Hamman, MD  methocarbamol (ROBAXIN) 500 MG tablet Take 1 tablet (500  mg total) by mouth 3 (three) times daily. 10/20/20   Lavina Hamman, MD  montelukast (SINGULAIR) 10 MG tablet Take 10 mg by mouth at bedtime. 11/07/13   [provider]  oxyCODONE-acetaminophen (PERCOCET/ROXICET) 5-325 MG tablet Take 1 tablet by mouth every 8 (eight) hours as needed for moderate pain. 12/15/20   Edrick Kins, DPM  polyethylene glycol (MIRALAX / GLYCOLAX) 17 g packet Take 17 g by mouth 2 (two) times daily. 10/20/20   Lavina Hamman, MD  pregabalin (LYRICA) 50 MG capsule Take 1 capsule (50 mg total) by mouth 2 (two) times daily. 10/20/20 10/20/21  Lavina Hamman, MD  zolpidem (AMBIEN) 10 MG tablet Take 10 mg by mouth at bedtime. 01/08/21   [provider]  zolpidem (AMBIEN) 5 MG tablet Take 1 tablet (5 mg total) by mouth at bedtime as needed for sleep. 10/20/20   Lavina Hamman, MD    Allergies Codeine, Codeine, Lanoxin [digoxin], Lanoxin [digoxin], Penicillins, Zoloft [sertraline hcl], and Penicillins  Family History  Problem Relation Age of Onset   Breast cancer Mother    Prostate cancer Father     Social History Social History   Tobacco Use   Smoking status: Never    Smokeless tobacco: Never  Vaping Use   Vaping Use: Never used  Substance Use Topics   Alcohol use: Never    Comment: rare   Drug use: No    Review of Systems Patient does seem to be confused and is asking me how the kidney is doing however near as I can tell: Constitutional: No fever/chills Eyes: No visual changes. ENT: No sore throat. Cardiovascular: Denies chest pain. Respiratory: Denies shortness of breath. Gastrointestinal: No abdominal pain.  No nausea, no vomiting.  No diarrhea.  No constipation. Genitourinary: Negative for dysuria. Musculoskeletal: Negative for back pain. Skin: Negative for rash. Neurological: Negative for headaches, focal weakness   ____________________________________________   PHYSICAL EXAM:  VITAL SIGNS: ED Triage Vitals [02/09/21 1611]  Enc Vitals Group     BP      Pulse      Resp      Temp      Temp src      SpO2      Weight 186 lb 15.2 oz (84.8 kg)     Height 5\' 8"  (1.727 m)     Head Circumference      Peak Flow      Pain Score      Pain Loc      Pain Edu?      Excl. in [place]?    Constitutional: Alert but confused and not always making sense well appearing and in no acute distress. Eyes: Conjunctivae are normal. PERRL. EOMI. Head: Atraumatic. Nose: No congestion/rhinnorhea. Mouth/Throat: Mucous membranes are moist.  Oropharynx non-erythematous. Neck: No stridor.   Cardiovascular: Normal rate, regular rhythm. Grossly normal heart sounds.  Good peripheral circulation. Respiratory: Normal respiratory effort.  No retractions. Lungs CTAB. Gastrointestinal: Soft and nontender. No distention. No abdominal bruits.  Musculoskeletal: No lower extremity tenderness bilateral mild edema.  Patient has redness and swelling of both MCP joints on the of the great toes.  Additionally there is some redness and swelling of the right foot most consistent with gout. Neurologic:  Normal speech and language. No gross focal neurologic deficits are  appreciated. No gait instability. Skin:  Skin is warm, dry and intact. [place] is no skin breakdown posteriorly.   ____________________________________________   LABS (all labs ordered are listed,  but only abnormal results are displayed)  Labs Reviewed  COMPREHENSIVE METABOLIC PANEL - Abnormal; Notable for the following components:      Result Value   Sodium 134 (*)    Potassium 3.3 (*)    Chloride 91 (*)    Glucose, Bld 512 (*)    Creatinine, Ser 1.14 (*)    Calcium 6.7 (*)    Albumin 2.5 (*)    GFR, Estimated 51 (*)    All other components within normal limits  BRAIN NATRIURETIC PEPTIDE - Abnormal; Notable for the following components:   B Natriuretic Peptide 220.4 (*)    All other components within normal limits  LACTIC ACID, PLASMA - Abnormal; Notable for the following components:   Lactic Acid, Venous 2.3 (*)    All other components within normal limits  CBC WITH DIFFERENTIAL/PLATELET - Abnormal; Notable for the following components:   WBC 15.5 (*)    Hemoglobin 11.4 (*)    HCT 35.2 (*)    Platelets 428 (*)    Neutro Abs 13.0 (*)    Monocytes Absolute 1.1 (*)    Abs Immature Granulocytes 0.15 (*)    All other components within normal limits  BLOOD GAS, ARTERIAL - Abnormal; Notable for the following components:   pH, Arterial 7.47 (*)    pO2, Arterial 306 (*)    Bicarbonate 29.8 (*)    Acid-Base Excess 5.6 (*)    All other components within normal limits  TROPONIN I (HIGH SENSITIVITY) - Abnormal; Notable for the following components:   Troponin I (High Sensitivity) 28 (*)    All other components within normal limits  RESP PANEL BY RT-PCR (FLU A&B, COVID) ARPGX2  LACTIC ACID, PLASMA  URINALYSIS, ROUTINE W REFLEX MICROSCOPIC  CBG MONITORING, ED  CBG MONITORING, ED  TROPONIN I (HIGH SENSITIVITY)   ____________________________________________  EKG  EKG read interpreted by me shows A. fib rate of 116 left axis very irregular baseline with a lot of artifact.   Additionally there is decreased amplitude in many of the chest leads.  Computer is reading ST elevation consider inferior injury I do not see any ST elevation at all.  We will try to obtain another EKG that is clear. ____________________________________________  RADIOLOGY Gertha Calkin, personally viewed and evaluated these images (plain radiographs) as part of my medical decision making, as well as reviewing the written report by the radiologist.  ED MD interpretation: Chest x-ray read by radiology reviewed by me shows possible central venous congestion.  Official radiology report(s): DG Chest Portable 1 View  Result Date: 02/09/2021 CLINICAL DATA:  Hypoxia EXAM: PORTABLE CHEST 1 VIEW COMPARISON:  Radiograph 09/16/2020 FINDINGS: Unchanged cardiomediastinal silhouette with dual chamber pacemaker leads. Mild central pulmonary vascular congestion. There is no focal airspace consolidation. There is no large pleural effusion or visible pneumothorax. Bilateral shoulder degenerative changes. No acute osseous abnormality. Thoracic spondylosis. IMPRESSION: Unchanged cardiomegaly. Mild central pulmonary vascular congestion. No focal airspace consolidation. Electronically Signed   By: Maurine Simmering M.D.   On: 02/09/2021 16:50    ____________________________________________   PROCEDURES  Procedure(s) performed (including Critical Care):  Procedures   ____________________________________________   INITIAL IMPRESSION / ASSESSMENT AND PLAN / ED COURSE  ABG shows a pH of 7.47 PCO2 41 PO2 of 300-6/10 on oxygen by nasal cannula will transition patient to room air.    ----------------------------------------- 6:17 PM on 02/09/2021 ----------------------------------------- Spoke with son-in-law and husband.  The patient recently [facility] for [facility]  status she had apparently been getting Ambien and then Percocet for her gout.  Off of these medications she was awake  alert oriented and doing well.  Her doctor Dr. Hall Busing apparently prescribed Ambien again which she is taking for 30 years but now she got the prescription yesterday and took some were not sure how many pills and has not gotten out of bed all day long while urinating in the bed and is confused.  This appears to be just like she was when she was on the Ambien last time.  She also has a white count of 15,000 which may be due to the gout and elevated lactic acid.  She is tachycardic EKG is pending at this time.  I will plan on getting her in the hospital for altered mental status possibly due to Ambien.  We will give her some antibiotics in case she has a UTI we have not been able to get a urine specimen yet.  With the elevated lactic acid and the white count she probably would meet sepsis criteria.  Although the labs daily individual would be caused by other problems.  Family did not want a head CT.  Because of the likelihood that this is from her Ambien as it apparently was in the past because she is moving everything equally and not slurring her speech I have not pressed them to get a head CT.   ____________________________________________   FINAL CLINICAL IMPRESSION(S) / ED DIAGNOSES  Final diagnoses:  Hyperglycemia  Elevated lactic acid level  Altered mental status, unspecified altered mental status type  Gout involving toe, unspecified cause, unspecified chronicity, unspecified laterality     ED Discharge Orders     None        Note:  This document was prepared using Dragon voice recognition software and may include unintentional dictation errors.    Nena Polio, MD 02/09/21 1831    Nena Polio, MD 02/09/21 [phone]

## 2021-02-09 NOTE — Sepsis Progress Note (Signed)
Code Sepsis protocol being monitored by eLink. 

## 2021-02-10 ENCOUNTER — Encounter: Payer: Self-pay | Admitting: Internal Medicine

## 2021-02-10 DIAGNOSIS — E876 Hypokalemia: Secondary | ICD-10-CM

## 2021-02-10 DIAGNOSIS — G9341 Metabolic encephalopathy: Secondary | ICD-10-CM

## 2021-02-10 DIAGNOSIS — M10079 Idiopathic gout, unspecified ankle and foot: Secondary | ICD-10-CM | POA: Diagnosis not present

## 2021-02-10 DIAGNOSIS — T426X1A Poisoning by other antiepileptic and sedative-hypnotic drugs, accidental (unintentional), initial encounter: Secondary | ICD-10-CM | POA: Diagnosis not present

## 2021-02-10 LAB — COMPREHENSIVE METABOLIC PANEL
ALT: 15 U/L (ref 0–44)
AST: 20 U/L (ref 15–41)
Albumin: 2.3 g/dL — ABNORMAL LOW (ref 3.5–5.0)
Alkaline Phosphatase: 82 U/L (ref 38–126)
Anion gap: 15 (ref 5–15)
BUN: 18 mg/dL (ref 8–23)
CO2: 28 mmol/L (ref 22–32)
Calcium: 6.6 mg/dL — ABNORMAL LOW (ref 8.9–10.3)
Chloride: 96 mmol/L — ABNORMAL LOW (ref 98–111)
Creatinine, Ser: 0.98 mg/dL (ref 0.44–1.00)
GFR, Estimated: >60 mL/min (ref >60)
Glucose, Bld: 230 mg/dL — ABNORMAL HIGH (ref 70–99)
Potassium: 2.8 mmol/L — ABNORMAL LOW (ref 3.5–5.1)
Sodium: 139 mmol/L (ref 135–145)
Total Bilirubin: 1.3 mg/dL — ABNORMAL HIGH (ref 0.3–1.2)
Total Protein: 6.9 g/dL (ref 6.5–8.1)

## 2021-02-10 LAB — GLUCOSE, CAPILLARY
Glucose-Capillary: 200 mg/dL — ABNORMAL HIGH (ref 70–99)
Glucose-Capillary: 170 mg/dL — ABNORMAL HIGH (ref 70–99)
Glucose-Capillary: 180 mg/dL — ABNORMAL HIGH (ref 70–99)
Glucose-Capillary: 196 mg/dL — ABNORMAL HIGH (ref 70–99)

## 2021-02-10 LAB — CBC
HCT: 32.8 % — ABNORMAL LOW (ref 36.0–46.0)
Hemoglobin: 10.6 g/dL — ABNORMAL LOW (ref 12.0–15.0)
MCH: 28.6 pg (ref 26.0–34.0)
MCHC: 32.3 g/dL (ref 30.0–36.0)
MCV: 88.4 fL (ref 80.0–100.0)
Platelets: 375 10*3/uL (ref 150–400)
RBC: 3.71 MIL/uL — ABNORMAL LOW (ref 3.87–5.11)
RDW: 13.6 % (ref 11.5–15.5)
WBC: 15.9 10*3/uL — ABNORMAL HIGH (ref 4.0–10.5)
nRBC: 0 % (ref 0.0–0.2)

## 2021-02-10 LAB — MAGNESIUM: Magnesium: 0.8 mg/dL — CL (ref 1.7–2.4)

## 2021-02-10 LAB — PHOSPHORUS: Phosphorus: 2.9 mg/dL (ref 2.5–4.6)

## 2021-02-10 MED ORDER — ALBUTEROL SULFATE (2.5 MG/3ML) 0.083% IN NEBU: 3.0000 mL | INHALATION_SOLUTION | RESPIRATORY_TRACT | Status: DC | PRN

## 2021-02-10 MED ORDER — ALBUTEROL SULFATE HFA 108 (90 BASE) MCG/ACT IN AERS: 2.0000 | INHALATION_SPRAY | Freq: Four times a day (QID) | RESPIRATORY_TRACT | Status: DC | PRN

## 2021-02-10 MED ORDER — ALLOPURINOL 100 MG PO TABS
100.0000 mg | ORAL_TABLET | Freq: Every day | ORAL | Status: DC
  Administered 2021-02-10 – 2021-02-11 (×2): 100 mg via ORAL
  Filled 2021-02-10 (×2): qty 1

## 2021-02-10 MED ORDER — COLCHICINE 0.6 MG PO TABS: 0.6000 mg | ORAL_TABLET | Freq: Every day | ORAL | Status: DC | PRN

## 2021-02-10 MED ORDER — POTASSIUM CHLORIDE CRYS ER 20 MEQ PO TBCR
40.0000 meq | EXTENDED_RELEASE_TABLET | ORAL | Status: AC
  Administered 2021-02-10 (×2): 40 meq via ORAL
  Filled 2021-02-10 (×2): qty 2

## 2021-02-10 MED ORDER — APIXABAN 5 MG PO TABS
5.0000 mg | ORAL_TABLET | Freq: Two times a day (BID) | ORAL | Status: DC
  Administered 2021-02-10 – 2021-02-11 (×3): 5 mg via ORAL
  Filled 2021-02-10 (×3): qty 1

## 2021-02-10 MED ORDER — POLYETHYLENE GLYCOL 3350 17 G PO PACK
17.0000 g | PACK | Freq: Two times a day (BID) | ORAL | Status: DC
  Administered 2021-02-10 – 2021-02-11 (×3): 17 g via ORAL
  Filled 2021-02-10 (×3): qty 1

## 2021-02-10 MED ORDER — LEVETIRACETAM 500 MG PO TABS
1000.0000 mg | ORAL_TABLET | Freq: Two times a day (BID) | ORAL | Status: DC
  Administered 2021-02-10 – 2021-02-11 (×3): 1000 mg via ORAL
  Filled 2021-02-10 (×3): qty 2

## 2021-02-10 MED ORDER — PREDNISONE 20 MG PO TABS
40.0000 mg | ORAL_TABLET | Freq: Once | ORAL | Status: AC
  Administered 2021-02-10: 40 mg via ORAL
  Filled 2021-02-10: qty 2

## 2021-02-10 MED ORDER — COLCHICINE 0.6 MG PO TABS
0.6000 mg | ORAL_TABLET | Freq: Every day | ORAL | Status: DC
  Administered 2021-02-11: 0.6 mg via ORAL
  Filled 2021-02-10: qty 1

## 2021-02-10 MED ORDER — MAGNESIUM SULFATE 4 GM/100ML IV SOLN
4.0000 g | Freq: Once | INTRAVENOUS | Status: AC
  Administered 2021-02-10: 4 g via INTRAVENOUS
  Filled 2021-02-10: qty 100

## 2021-02-10 MED ORDER — LISINOPRIL 10 MG PO TABS
5.0000 mg | ORAL_TABLET | Freq: Every day | ORAL | Status: DC
  Administered 2021-02-10 – 2021-02-11 (×2): 5 mg via ORAL
  Filled 2021-02-10 (×2): qty 1

## 2021-02-10 MED ORDER — INSULIN GLARGINE-YFGN 100 UNIT/ML ~~LOC~~ SOLN
12.0000 [IU] | Freq: Every day | SUBCUTANEOUS | Status: DC
  Administered 2021-02-10 – 2021-02-11 (×2): 12 [IU] via SUBCUTANEOUS
  Filled 2021-02-10 (×3): qty 0.12

## 2021-02-10 MED ORDER — DOCUSATE SODIUM 100 MG PO CAPS
100.0000 mg | ORAL_CAPSULE | Freq: Two times a day (BID) | ORAL | Status: DC
  Administered 2021-02-10 – 2021-02-11 (×3): 100 mg via ORAL
  Filled 2021-02-10 (×3): qty 1

## 2021-02-10 MED ORDER — FAMOTIDINE 20 MG PO TABS
20.0000 mg | ORAL_TABLET | Freq: Two times a day (BID) | ORAL | Status: DC
  Administered 2021-02-10 – 2021-02-11 (×3): 20 mg via ORAL
  Filled 2021-02-10 (×3): qty 1

## 2021-02-10 MED ORDER — ACETAMINOPHEN 325 MG PO TABS
650.0000 mg | ORAL_TABLET | Freq: Once | ORAL | Status: AC
  Administered 2021-02-10: 650 mg via ORAL
  Filled 2021-02-10: qty 2

## 2021-02-10 NOTE — Progress Notes (Signed)
Pt's daughter, Ailene Ravel, requesting MD update and provided work phone number for callback: [phone]. Jennye Boroughs, MD paged. Will continue to monitor.

## 2021-02-10 NOTE — Progress Notes (Signed)
Inpatient Diabetes Program Recommendations  AACE/ADA: New Consensus Statement on Inpatient Glycemic Control (2015)  Target Ranges:  Prepandial:   less than 140 mg/dL      Peak postprandial:   less than 180 mg/dL (1-2 hours)      Critically ill patients:  140 - 180 mg/dL  Results for ADAMA, IVINS (MRN [id number]) as of 02/10/2021 10:11  Ref. Range 02/09/2021 20:16 02/09/2021 23:01 02/10/2021 07:42  Glucose-Capillary Latest Ref Range: 70 - 99 mg/dL 422 (H) 376 (H)  5 units Novolog 200 (H)  Results for KABRINA, CHRISTIANO (MRN [id number]) as of 02/10/2021 06:50  Ref. Range 02/09/2021 16:22  Glucose Latest Ref Range: 70 - 99 mg/dL [facility])  Results for JACARA, BENITO (MRN [id number]) as of 02/10/2021 06:50  Ref. Range 01/17/2021 04:43  Hemoglobin A1C Latest Ref Range: 4.8 - 5.6 % 11.5 (H)    Admit with:  AMS/ Hyperglycemia Accidental Ambien overdose/ UTI  History: DM2, Recent Gout Flare, CVA  Home DM Meds: Metformin 500 mg BID       Glipizide 5 mg daily  Current Orders: Novolog Resistant Correction Scale/ SSI (0-20 units) TID AC + HS     Note A1c was 11.5% back in October.  Diabetes Coordinator RN counseled pt during this admission.  MD- Note CBG 200 this AM.  Please consider starting Semglee 8 units Daily (0.1 units/kg)    --Will follow patient during hospitalization--  Wyn Quaker RN, MSN, CDE Diabetes Coordinator Inpatient Glycemic Control Team Team Pager: [phone] (8a-5p)

## 2021-02-10 NOTE — Plan of Care (Signed)
  Problem: Education: Goal: Knowledge of General Education information will improve Description: Including pain rating scale, medication(s)/side effects and non-pharmacologic comfort measures Outcome: Progressing   Problem: Health Behavior/Discharge Planning: Goal: Ability to manage health-related needs will improve Outcome: Progressing   Problem: Clinical Measurements: Goal: Ability to maintain clinical measurements within normal limits will improve Outcome: Progressing Goal: Respiratory complications will improve Outcome: Progressing Goal: Cardiovascular complication will be avoided Outcome: Progressing   Problem: Nutrition: Goal: Adequate nutrition will be maintained Outcome: Progressing   Problem: Elimination: Goal: Will not experience complications related to urinary retention Outcome: Progressing   Problem: Pain Managment: Goal: General experience of comfort will improve Outcome: Progressing   Problem: Safety: Goal: Ability to remain free from injury will improve Outcome: Progressing   Problem: Skin Integrity: Goal: Risk for impaired skin integrity will decrease Outcome: Progressing   

## 2021-02-10 NOTE — Progress Notes (Addendum)
Progress Note    Traci Carter  RKY:[id number] DOB: [birth year]  DOA: 02/09/2021 PCP: Albina Billet, MD      Brief Narrative:    Medical records reviewed and are as summarized below:  Traci Carter is a 75 y.o. female with past medical history significant for type II DM, hypertension, persistent atrial fibrillation, hyperlipidemia, fibromyalgia with chronic pain, history of stroke, CKD stage IIIa, gout with recent flareups, frequent UTIs (recently discharged from the hospital on 01/18/2021 for E. coli UTI.  She was brought to the hospital because of altered mental status and hyperglycemia.  It was suspected that patient had accidentally overdosed on Ambien.  According to her family, she was not eating and drinking well and has been confused since taking Ambien on 02/08/2021.      Assessment/Plan:   Principal Problem:   Ambien accidental overdose, initial encounter Active Problems:   Essential hypertension   Hyperlipidemia associated with type 2 diabetes mellitus (HCC)   CAD (coronary artery disease)   Diabetes mellitus type 2, uncomplicated (HCC)   CKD (chronic kidney disease), stage IIIa   UTI (urinary tract infection)   Acute metabolic encephalopathy   Gout flare   Atrial fibrillation, chronic (HCC)   Hypokalemia   Hypocalcemia    Body mass index is 28.43 kg/m.   Acute toxic metabolic encephalopathy: Mental status is improved.  Suspected accidental Ambien overdose: Ambien has been discontinued and should not be resumed at discharge.  This was discussed with the patient.  Patient's daughter and son-in-law also said they had spoken to Dr. Hall Busing, her PCP, who said that she should stop taking Ambien at home.  Fever, suspected UTI: Recent E. coli UTI with positive cultures from 01/14/2021.  Continue IV Rocephin.  Acute gout flare of bilateral feet and big toes: Start prednisone.  Continue colchicine and allopurinol.  Type 2 diabetes mellitus with  hyperglycemia: Metformin and glipizide have been held.  Start insulin glargine.  Continue NovoLog as needed for hyperglycemia.  Recent hemoglobin A1c was 11.5  Hypokalemia and hypomagnesemia: Replete potassium and magnesium respectively.  Persistent atrial fibrillation: Continue Eliquis  Seizure disorder: Continue Keppra  Other comorbidities include CKD stage IIIa, hypertension, hyperlipidemia, history of stroke   Diet Order             Diet heart healthy/carb modified Room service appropriate? Yes; Fluid consistency: Thin  Diet effective now                      Consultants: None  Procedures: None    Medications:    allopurinol  100 mg Oral Daily   apixaban  5 mg Oral BID   colchicine  0.6 mg Oral BID   [START ON 02/11/2021] colchicine  0.6 mg Oral Daily   docusate sodium  100 mg Oral BID   famotidine  20 mg Oral BID   insulin aspart  0-20 Units Subcutaneous TID WC   insulin aspart  0-5 Units Subcutaneous QHS   insulin glargine-yfgn  12 Units Subcutaneous Daily   levETIRAcetam  1,000 mg Oral BID   lisinopril  5 mg Oral Daily   polyethylene glycol  17 g Oral BID   potassium chloride  40 mEq Oral Q4H   Continuous Infusions:  0.9 % NaCl with KCl 20 mEq / L 75 mL/hr at 02/10/21 0609   cefTRIAXone (ROCEPHIN)  IV Stopped (02/09/21 1919)   magnesium sulfate bolus IVPB 4 g (02/10/21 1147)  Anti-infectives (From admission, onward)    Start     Dose/Rate Route Frequency Ordered Stop   02/10/21 1800  cefTRIAXone (ROCEPHIN) 1 g in sodium chloride 0.9 % 100 mL IVPB  Status:  Discontinued        1 g 200 mL/hr over 30 Minutes Intravenous Every 24 hours 02/09/21 2216 02/09/21 2219   02/09/21 1830  cefTRIAXone (ROCEPHIN) 2 g in sodium chloride 0.9 % 100 mL IVPB        2 g 200 mL/hr over 30 Minutes Intravenous Every 24 hours 02/09/21 1822 02/16/21 1829              Family Communication/Anticipated D/C date and plan/Code Status   DVT prophylaxis:   apixaban (ELIQUIS) tablet 5 mg     Code Status: Full Code  Family Communication: Plan discussed with Adonis Brook (daughter) and Idolina Primer (son-in-law) over the phone Disposition Plan: Home in 1 to 2 days   Status is: Inpatient  Remains inpatient appropriate because: On IV antibiotics           Subjective:   Interval events noted.  She had fever with temperature of 100.5 F this morning.  She complains of pain in bilateral feet and toes.  Objective:    Vitals:   02/09/21 2006 02/09/21 2049 02/10/21 0421 02/10/21 0740  BP:  (!) 146/92 129/75 122/69  Pulse:  (!) 107 (!) 106 98  Resp:  18 18 18   Temp: 99.1 F (37.3 C) 99.6 F (37.6 C) (!) 100.5 F (38.1 C) 97.9 F (36.6 C)  TempSrc: Oral Oral Oral Oral  SpO2:  97% 95% 93%  Weight:      Height:       No data found.   Intake/Output Summary (Last 24 hours) at 02/10/2021 1325 Last data filed at 02/10/2021 1018 Gross per 24 hour  Intake 460.31 ml  Output --  Net 460.31 ml   Filed Weights   02/09/21 1611  Weight: 84.8 kg    Exam:   GEN: NAD SKIN: Warm and dry EYES: No pallor or icterus ENT: MMM CV: RRR PULM: CTA B ABD: soft, obese, NT, +BS CNS: AAO x 3, non focal EXT: No edema or tenderness. Swelling, erythema and tenderness at the medial border of b/l MTP joints. Hammer toes       Data Reviewed:   I have personally reviewed following labs and imaging studies:  Labs: Labs show the following:   Basic Metabolic Panel: Recent Labs  Lab 02/09/21 1622 02/10/21 0453  NA 134* 139  K 3.3* 2.8*  CL 91* 96*  CO2 30 28  GLUCOSE 512* 230*  BUN 23 18  CREATININE 1.14* 0.98  CALCIUM 6.7* 6.6*  MG  --  0.8*  PHOS  --  2.9   GFR Estimated Creatinine Clearance: 58.4 mL/min (by C-G formula based on SCr of 0.98 mg/dL). Liver Function Tests: Recent Labs  Lab 02/09/21 1622 02/10/21 0453  AST 22 20  ALT 14 15  ALKPHOS 95 82  BILITOT 0.9 1.3*  PROT 7.8 6.9  ALBUMIN 2.5* 2.3*   No results  for input(s): LIPASE, AMYLASE in the last 168 hours. No results for input(s): AMMONIA in the last 168 hours. Coagulation profile No results for input(s): INR, PROTIME in the last 168 hours.  CBC: Recent Labs  Lab 02/09/21 1622 02/10/21 0453  WBC 15.5* 15.9*  NEUTROABS 13.0*  --   HGB 11.4* 10.6*  HCT 35.2* 32.8*  MCV 89.6 88.4  PLT 428* 375  Cardiac Enzymes: No results for input(s): CKTOTAL, CKMB, CKMBINDEX, TROPONINI in the last 168 hours. BNP (last 3 results) No results for input(s): PROBNP in the last 8760 hours. CBG: Recent Labs  Lab 02/09/21 2016 02/09/21 2301 02/10/21 0742 02/10/21 1130  GLUCAP 422* 376* 200* 170*   D-Dimer: No results for input(s): DDIMER in the last 72 hours. Hgb A1c: No results for input(s): HGBA1C in the last 72 hours. Lipid Profile: No results for input(s): CHOL, HDL, LDLCALC, TRIG, CHOLHDL, LDLDIRECT in the last 72 hours. Thyroid function studies: No results for input(s): TSH, T4TOTAL, T3FREE, THYROIDAB in the last 72 hours.  Invalid input(s): FREET3 Anemia work up: No results for input(s): VITAMINB12, FOLATE, FERRITIN, TIBC, IRON, RETICCTPCT in the last 72 hours. Sepsis Labs: Recent Labs  Lab 02/09/21 1622 02/09/21 1815 02/10/21 0453  WBC 15.5*  --  15.9*  LATICACIDVEN 2.3* 1.5  --     Microbiology Recent Results (from the past 240 hour(s))  Resp Panel by RT-PCR (Flu A&B, Covid) Nasopharyngeal Swab     Status: None   Collection Time: 02/09/21  6:15 PM   Specimen: Nasopharyngeal Swab; Nasopharyngeal(NP) swabs in vial transport medium  Result Value Ref Range Status   SARS Coronavirus 2 by RT PCR NEGATIVE NEGATIVE Final    Comment: (NOTE) SARS-CoV-2 target nucleic acids are NOT DETECTED.  The SARS-CoV-2 RNA is generally detectable in upper respiratory specimens during the acute phase of infection. The lowest concentration of SARS-CoV-2 viral copies this assay can detect is 138 copies/mL. A negative result does not preclude  SARS-Cov-2 infection and should not be used as the sole basis for treatment or other patient management decisions. A negative result may occur with  improper specimen collection/handling, submission of specimen other than nasopharyngeal swab, presence of viral mutation(s) within the areas targeted by this assay, and inadequate number of viral copies(<138 copies/mL). A negative result must be combined with clinical observations, patient history, and epidemiological information. The expected result is Negative.  Fact Sheet for Patients:  EntrepreneurPulse.com.au  Fact Sheet for Healthcare Providers:  IncredibleEmployment.be  This test is no t yet approved or cleared by the [place] FDA and  has been authorized for detection and/or diagnosis of SARS-CoV-2 by FDA under an Emergency Use Authorization (EUA). This EUA will remain  in effect (meaning this test can be used) for the duration of the COVID-19 declaration under Section 564(b)(1) of the Act, 21 U.S.C.section 360bbb-3(b)(1), unless the authorization is terminated  or revoked sooner.       Influenza A by PCR NEGATIVE NEGATIVE Final   Influenza B by PCR NEGATIVE NEGATIVE Final    Comment: (NOTE) The Xpert Xpress SARS-CoV-2/FLU/RSV plus assay is intended as an aid in the diagnosis of influenza from Nasopharyngeal swab specimens and should not be used as a sole basis for treatment. Nasal washings and aspirates are unacceptable for Xpert Xpress SARS-CoV-2/FLU/RSV testing.  Fact Sheet for Patients: EntrepreneurPulse.com.au  Fact Sheet for Healthcare Providers: IncredibleEmployment.be  This test is not yet approved or cleared by the [place] FDA and has been authorized for detection and/or diagnosis of SARS-CoV-2 by FDA under an Emergency Use Authorization (EUA). This EUA will remain in effect (meaning this test can be used) for the duration of  the COVID-19 declaration under Section 564(b)(1) of the Act, 21 U.S.C. section 360bbb-3(b)(1), unless the authorization is terminated or revoked.  Performed at [facility], [address], [place], [place] [address]   Culture, blood (single)     Status:  None (Preliminary result)   Collection Time: 02/09/21  7:51 PM   Specimen: BLOOD  Result Value Ref Range Status   Specimen Description BLOOD BLOOD LEFT HAND  Final   Special Requests   Final    BOTTLES DRAWN AEROBIC AND ANAEROBIC Blood Culture adequate volume   Culture   Final    NO GROWTH < 12 HOURS Performed at [facility], [address] [place], [place] [address]    Report Status PENDING  Incomplete    Procedures and diagnostic studies:  DG Chest Portable 1 View  Result Date: 02/09/2021 CLINICAL DATA:  Hypoxia EXAM: PORTABLE CHEST 1 VIEW COMPARISON:  Radiograph 09/16/2020 FINDINGS: Unchanged cardiomediastinal silhouette with dual chamber pacemaker leads. Mild central pulmonary vascular congestion. There is no focal airspace consolidation. There is no large pleural effusion or visible pneumothorax. Bilateral shoulder degenerative changes. No acute osseous abnormality. Thoracic spondylosis. IMPRESSION: Unchanged cardiomegaly. Mild central pulmonary vascular congestion. No focal airspace consolidation. Electronically Signed   By: Maurine Simmering M.D.   On: 02/09/2021 16:50               LOS: 1 day   Traci Carter  [facility]   Pager on [email or web address]. If 7PM-7AM, please contact night-coverage at [email or web address]     02/10/2021, 1:25 PM

## 2021-02-10 NOTE — Progress Notes (Signed)
Date and time results received: 02/10/21 0916   Test: magnesium Critical Value: 0.8   Name of Provider Notified: Jennye Boroughs, MD paged  Orders Received? Or Actions Taken?:  No new orders at this time. Will continue to monitor.

## 2021-02-10 NOTE — TOC Initial Note (Signed)
Transition of Care [facility]) - Initial/Assessment Note    Patient Details  Name: Traci Carter MRN: [id number] Date of Birth: 08-Feb-1946  [facility] CM/SW Contact:    Beverly Sessions, RN Phone Number: 02/10/2021, 12:51 PM  Clinical Narrative:                  Patient admitted from home with accidental Ambien overdose Patient lives at home with husband   PCP Hall Busing - husband provides transportation to appointments Daughter lives locally for support  Patient has RW, rollator and cane in the home Patient open with home health RN and PT through [place].  Corene Cornea with [facility] notified of admission Expected Discharge Plan: [place] Barriers to Discharge: Continued Medical Work up   Patient Goals and CMS Choice        Expected Discharge Plan and Services Expected Discharge Plan: [place]       Living arrangements for the past 2 months: [place] Agency: [place] ([place]) Date [facility] Agency Contacted: 02/10/21   Representative spoke with at [place]: Corene Cornea  Prior Living Arrangements/Services Living arrangements for the past 2 months: [place] Lives with:: Spouse Patient language and need for interpreter reviewed:: Yes        Need for Family Participation in Patient Care: Yes (Comment) Care giver support system in place?: Yes (comment) Current home services: DME, Home PT, Home RN Criminal Activity/Legal Involvement Pertinent to Current Situation/Hospitalization: No - Comment as needed  Activities of Daily Living      Permission Sought/Granted                  Emotional Assessment       Orientation: : Oriented to Self, Oriented to Place Alcohol / Substance Use: Not Applicable Psych Involvement: No (comment)  Admission diagnosis:  Hyperglycemia [R73.9] Elevated lactic acid level [R79.89] Ambien accidental  overdose, initial encounter [T42.6X1A] Altered mental status, unspecified altered mental status type [R41.82] Gout involving toe, unspecified cause, unspecified chronicity, unspecified laterality [M10.9] Patient Active Problem List   Diagnosis Date Noted   Ambien accidental overdose, initial encounter 02/09/2021   Hypokalemia 02/09/2021   Hypocalcemia 02/09/2021   Acquired thrombophilia ([place]) 01/16/2021   UTI (urinary tract infection) 32/95/1884   Acute metabolic encephalopathy 16/60/6301   Gout flare 01/14/2021   Hyperglycemia due to type 2 diabetes mellitus ([place]) 01/14/2021   Atrial fibrillation, chronic ([place]) 01/14/2021   HTN (hypertension) 01/14/2021   History of CVA (cerebrovascular accident) 01/14/2021   Fracture of femoral condyle, right, closed ([place]) 10/17/2020   Generalized weakness 09/16/2020   Type II diabetes mellitus with renal manifestations ([place]) 09/16/2020   CKD (chronic kidney disease), stage IIIa 09/16/2020   Depression with anxiety 09/16/2020   Fall 09/16/2020   UTI (urinary tract infection) 09/16/2020   Seizure ([place]) 09/16/2020   Diabetes mellitus type 2, uncomplicated ([place]) 60/01/9322   Chest pain with low risk for cardiac etiology 12/27/2018   SOB (shortness of breath) on exertion 12/27/2018   Paroxysmal atrial fibrillation ([place]) 10/16/2018   Hyperlipidemia associated with type 2 diabetes mellitus ([place]) 10/16/2018   Pacemaker 10/16/2018   History of CVA (cerebrovascular accident) 10/16/2018   OSA (obstructive sleep apnea) 10/16/2018   Fibromyalgia 10/16/2018   CAD (coronary  artery disease) 10/16/2018   Diarrhea 06/09/2017   Hyperlipemia 08/15/2013   ICH (intracerebral hemorrhage) ([place]) 07/21/2012   Diabetes ([place]) 07/21/2012   Essential hypertension 07/21/2012   CVA (cerebral vascular accident) ([place]) 07/20/2012   PCP:  Albina Billet, MD Pharmacy:   [facility] [phone] - [address], [place] [address] [place] [address] Phone: [phone] Fax: [phone]     Social Determinants of Health (SDOH) Interventions    Readmission Risk Interventions No flowsheet data found.

## 2021-02-11 DIAGNOSIS — G9341 Metabolic encephalopathy: Secondary | ICD-10-CM | POA: Diagnosis not present

## 2021-02-11 DIAGNOSIS — T426X1A Poisoning by other antiepileptic and sedative-hypnotic drugs, accidental (unintentional), initial encounter: Secondary | ICD-10-CM | POA: Diagnosis not present

## 2021-02-11 LAB — BASIC METABOLIC PANEL
Anion gap: 13 (ref 5–15)
BUN: 20 mg/dL (ref 8–23)
CO2: 26 mmol/L (ref 22–32)
Calcium: 6.9 mg/dL — ABNORMAL LOW (ref 8.9–10.3)
Chloride: 102 mmol/L (ref 98–111)
Creatinine, Ser: 0.89 mg/dL (ref 0.44–1.00)
GFR, Estimated: >60 mL/min (ref >60)
Glucose, Bld: 194 mg/dL — ABNORMAL HIGH (ref 70–99)
Potassium: 4 mmol/L (ref 3.5–5.1)
Sodium: 141 mmol/L (ref 135–145)

## 2021-02-11 LAB — GLUCOSE, CAPILLARY
Glucose-Capillary: 190 mg/dL — ABNORMAL HIGH (ref 70–99)
Glucose-Capillary: 218 mg/dL — ABNORMAL HIGH (ref 70–99)
Glucose-Capillary: 117 mg/dL — ABNORMAL HIGH (ref 70–99)

## 2021-02-11 LAB — MAGNESIUM: Magnesium: 2 mg/dL (ref 1.7–2.4)

## 2021-02-11 MED ORDER — COLCHICINE 0.6 MG PO TABS: 0.6000 mg | ORAL_TABLET | Freq: Every day | ORAL | Status: DC

## 2021-02-11 MED ORDER — SODIUM CHLORIDE 0.9 % IV SOLN
2.0000 g | Freq: Once | INTRAVENOUS | Status: AC
  Administered 2021-02-11: 2 g via INTRAVENOUS
  Filled 2021-02-11: qty 20

## 2021-02-11 NOTE — TOC Transition Note (Signed)
Transition of Care [facility]) - CM/SW Discharge Note   Patient Details  Name: Traci Carter MRN: [id number] Date of Birth: 1945/10/09  [facility] CM/SW Contact:  Candie Chroman, LCSW Phone Number: 02/11/2021, 3:50 PM   Clinical Narrative:   Patient has orders to discharge home today. [place] representative is aware. No further concerns. CSW signing off.  Final next level of care: [place] Barriers to Discharge: Barriers Resolved   Patient Goals and CMS Choice     Choice offered to / list presented to : NA  Discharge Placement                    Patient and family notified of of transfer: 02/11/21  Discharge Plan and Services                          HH Arranged: RN, PT [facility] Agency: [place] ([place]) Date [place]: 02/11/21   Representative spoke with at [place]: Floydene Flock  Social Determinants of Health (SDOH) Interventions     Readmission Risk Interventions No flowsheet data found.

## 2021-02-11 NOTE — Discharge Summary (Signed)
Physician Discharge Summary  Traci Carter [id number] DOB: 04/02/1946 DOA: 02/09/2021  PCP: Albina Billet, MD  Admit date: 02/09/2021 Discharge date: 02/11/2021  Discharge disposition: Home with home health therapy   Recommendations for Outpatient Follow-Up:   Follow-up with PCP in 1 week  Discharge Diagnosis:   Principal Problem:   Ambien accidental overdose, initial encounter Active Problems:   Essential hypertension   Hyperlipidemia associated with type 2 diabetes mellitus ([place])   CAD (coronary artery disease)   Diabetes mellitus type 2, uncomplicated (HCC)   CKD (chronic kidney disease), stage IIIa   UTI (urinary tract infection)   Acute metabolic encephalopathy   Gout flare   Atrial fibrillation, chronic (HCC)   Hypokalemia   Hypocalcemia    Discharge Condition: Stable.  Diet recommendation:  Diet Order             Diet - low sodium heart healthy           Diet Carb Modified           Diet heart healthy/carb modified Room service appropriate? Yes; Fluid consistency: Thin  Diet effective now                     Code Status: Full Code     Hospital Course:   [name] is a 75 y.o. female with past medical history significant for type II DM, hypertension, persistent atrial fibrillation, hyperlipidemia, fibromyalgia with chronic pain, history of stroke, CKD stage IIIa, gout with recent flareups, frequent UTIs (recently discharged from the hospital on 01/18/2021 for E. coli UTI.  She was brought to the hospital because of altered mental status and hyperglycemia.  It was suspected that patient had accidentally overdosed on Ambien.  According to her family, she was not eating and drinking well and has been confused since taking Ambien on 02/08/2021  She was admitted to the hospital for acute toxic metabolic encephalopathy.  Urinalysis was abnormal.  It was not clear whether she still had a UTI.  She was treated with empiric IV  Rocephin.  Her condition has improved.  She has been advised to avoid Ambien  She is deemed stable for discharge to home today.       Discharge Exam:    Vitals:   02/10/21 1939 02/11/21 0441 02/11/21 0756 02/11/21 1540  BP: (!) 144/88 139/83 (!) 136/53 133/82  Pulse: (!) 106 89 (!) 110 (!) 105  Resp: 15 15 18 15   Temp: 98 F (36.7 C) 98.1 F (36.7 C) 97.9 F (36.6 C) 98 F (36.7 C)  TempSrc: Oral Oral Oral Oral  SpO2: 94%  98% 92%  Weight:      Height:         GEN: NAD SKIN: No rash EYES: EOMI ENT: MMM CV: RRR PULM: CTA B ABD: soft, obese, NT, +BS CNS: AAO x 3, non focal EXT: No edema or tenderness   The results of significant diagnostics from this hospitalization (including imaging, microbiology, ancillary and laboratory) are listed below for reference.     Procedures and Diagnostic Studies:   DG Chest Portable 1 View  Result Date: 02/09/2021 CLINICAL DATA:  Hypoxia EXAM: PORTABLE CHEST 1 VIEW COMPARISON:  Radiograph 09/16/2020 FINDINGS: Unchanged cardiomediastinal silhouette with dual chamber pacemaker leads. Mild central pulmonary vascular congestion. There is no focal airspace consolidation. There is no large pleural effusion or visible pneumothorax. Bilateral shoulder degenerative changes. No acute osseous abnormality. Thoracic spondylosis. IMPRESSION: Unchanged cardiomegaly. Mild central  pulmonary vascular congestion. No focal airspace consolidation. Electronically Signed   By: Maurine Simmering M.D.   On: 02/09/2021 16:50     Labs:   Basic Metabolic Panel: Recent Labs  Lab 02/09/21 1622 02/10/21 0453 02/11/21 0640  NA 134* 139 141  K 3.3* 2.8* 4.0  CL 91* 96* 102  CO2 30 28 26   GLUCOSE 512* 230* 194*  BUN 23 18 20   CREATININE 1.14* 0.98 0.89  CALCIUM 6.7* 6.6* 6.9*  MG  --  0.8* 2.0  PHOS  --  2.9  --    GFR Estimated Creatinine Clearance: 64.3 mL/min (by C-G formula based on SCr of 0.89 mg/dL). Liver Function Tests: Recent Labs  Lab  02/09/21 1622 02/10/21 0453  AST 22 20  ALT 14 15  ALKPHOS 95 82  BILITOT 0.9 1.3*  PROT 7.8 6.9  ALBUMIN 2.5* 2.3*   No results for input(s): LIPASE, AMYLASE in the last 168 hours. No results for input(s): AMMONIA in the last 168 hours. Coagulation profile No results for input(s): INR, PROTIME in the last 168 hours.  CBC: Recent Labs  Lab 02/09/21 1622 02/10/21 0453  WBC 15.5* 15.9*  NEUTROABS 13.0*  --   HGB 11.4* 10.6*  HCT 35.2* 32.8*  MCV 89.6 88.4  PLT 428* 375   Cardiac Enzymes: No results for input(s): CKTOTAL, CKMB, CKMBINDEX, TROPONINI in the last 168 hours. BNP: Invalid input(s): POCBNP CBG: Recent Labs  Lab 02/10/21 1130 02/10/21 1634 02/10/21 2218 02/11/21 0758 02/11/21 1142  GLUCAP 170* 180* 196* 190* 218*   D-Dimer No results for input(s): DDIMER in the last 72 hours. Hgb A1c No results for input(s): HGBA1C in the last 72 hours. Lipid Profile No results for input(s): CHOL, HDL, LDLCALC, TRIG, CHOLHDL, LDLDIRECT in the last 72 hours. Thyroid function studies No results for input(s): TSH, T4TOTAL, T3FREE, THYROIDAB in the last 72 hours.  Invalid input(s): FREET3 Anemia work up No results for input(s): VITAMINB12, FOLATE, FERRITIN, TIBC, IRON, RETICCTPCT in the last 72 hours. Microbiology Recent Results (from the past 240 hour(s))  Resp Panel by RT-PCR (Flu A&B, Covid) Nasopharyngeal Swab     Status: None   Collection Time: 02/09/21  6:15 PM   Specimen: Nasopharyngeal Swab; Nasopharyngeal(NP) swabs in vial transport medium  Result Value Ref Range Status   SARS Coronavirus 2 by RT PCR NEGATIVE NEGATIVE Final    Comment: (NOTE) SARS-CoV-2 target nucleic acids are NOT DETECTED.  The SARS-CoV-2 RNA is generally detectable in upper respiratory specimens during the acute phase of infection. The lowest concentration of SARS-CoV-2 viral copies this assay can detect is 138 copies/mL. A negative result does not preclude SARS-Cov-2 infection and  should not be used as the sole basis for treatment or other patient management decisions. A negative result may occur with  improper specimen collection/handling, submission of specimen other than nasopharyngeal swab, presence of viral mutation(s) within the areas targeted by this assay, and inadequate number of viral copies(<138 copies/mL). A negative result must be combined with clinical observations, patient history, and epidemiological information. The expected result is Negative.  Fact Sheet for Patients:  EntrepreneurPulse.com.au  Fact Sheet for Healthcare Providers:  IncredibleEmployment.be  This test is no t yet approved or cleared by the [place] FDA and  has been authorized for detection and/or diagnosis of SARS-CoV-2 by FDA under an Emergency Use Authorization (EUA). This EUA will remain  in effect (meaning this test can be used) for the duration of the COVID-19 declaration under Section 564(b)(1) of  the Act, 21 U.S.C.section 360bbb-3(b)(1), unless the authorization is terminated  or revoked sooner.       Influenza A by PCR NEGATIVE NEGATIVE Final   Influenza B by PCR NEGATIVE NEGATIVE Final    Comment: (NOTE) The Xpert Xpress SARS-CoV-2/FLU/RSV plus assay is intended as an aid in the diagnosis of influenza from Nasopharyngeal swab specimens and should not be used as a sole basis for treatment. Nasal washings and aspirates are unacceptable for Xpert Xpress SARS-CoV-2/FLU/RSV testing.  Fact Sheet for Patients: EntrepreneurPulse.com.au  Fact Sheet for Healthcare Providers: IncredibleEmployment.be  This test is not yet approved or cleared by the [place] FDA and has been authorized for detection and/or diagnosis of SARS-CoV-2 by FDA under an Emergency Use Authorization (EUA). This EUA will remain in effect (meaning this test can be used) for the duration of the COVID-19 declaration  under Section 564(b)(1) of the Act, 21 U.S.C. section 360bbb-3(b)(1), unless the authorization is terminated or revoked.  Performed at [facility], [address] [place], [place] [address]   Culture, blood (single)     Status: None (Preliminary result)   Collection Time: 02/09/21  7:51 PM   Specimen: BLOOD  Result Value Ref Range Status   Specimen Description BLOOD BLOOD LEFT HAND  Final   Special Requests   Final    BOTTLES DRAWN AEROBIC AND ANAEROBIC Blood Culture adequate volume   Culture   Final    NO GROWTH 2 DAYS Performed at [facility], [address] [place], [place] [address]    Report Status PENDING  Incomplete     Discharge Instructions:   Discharge Instructions     Diet - low sodium heart healthy   Complete by: As directed    Diet Carb Modified   Complete by: As directed    Face-to-face encounter (required for Medicare/Medicaid patients)   Complete by: As directed    I Aylissa Heinemann certify that this patient is under my care and that I, or a nurse practitioner or physician's assistant working with me, had a face-to-face encounter that meets the physician face-to-face encounter requirements with this patient on 02/11/2021. The encounter with the patient was in whole, or in part for the following medical condition(s) which is the primary reason for home health care (List medical condition): Debility   The encounter with the patient was in whole, or in part, for the following medical condition, which is the primary reason for home health care: Debility   I certify that, based on my findings, the following services are medically necessary home health services:  Physical therapy Nursing     Reason for Medically Necessary Home Health Services: Therapy- Personnel officer, Public librarian   My clinical findings support the need for the above services: Unsafe ambulation due to balance issues   Further, I certify that my clinical  findings support that this patient is homebound due to: Unable to leave home safely without assistance   Home Health   Complete by: As directed    To provide the following care/treatments:  PT RN     Increase activity slowly   Complete by: As directed       Allergies as of 02/11/2021       Reactions   Codeine Other (See Comments)   GI Distress GI Distress GI Distress   Codeine    Digoxin    Other reaction(s): Unknown   Lanoxin [digoxin]    Penicillins    Zoloft [sertraline Hcl]  Penicillins Rash, Other (See Comments)        Medication List     STOP taking these medications    cephALEXin 500 MG capsule Commonly known as: KEFLEX   DULoxetine 60 MG capsule Commonly known as: CYMBALTA   oxyCODONE-acetaminophen 5-325 MG tablet Commonly known as: PERCOCET/ROXICET   pregabalin 50 MG capsule Commonly known as: Lyrica   zolpidem 10 MG tablet Commonly known as: AMBIEN       TAKE these medications    acetaminophen 325 MG tablet Commonly known as: TYLENOL Take 2 tablets (650 mg total) by mouth every 6 (six) hours as needed for mild pain, headache, fever or moderate pain.   albuterol 108 (90 Base) MCG/ACT inhaler Commonly known as: VENTOLIN HFA Inhale 2 puffs into the lungs every 6 (six) hours as needed for wheezing or shortness of breath.   albuterol (2.5 MG/3ML) 0.083% nebulizer solution Commonly known as: PROVENTIL Inhale 3 mLs into the lungs every 4 (four) hours as needed for wheezing or shortness of breath.   allopurinol 100 MG tablet Commonly known as: ZYLOPRIM Take 100 mg by mouth daily.   atorvastatin 20 MG tablet Commonly known as: LIPITOR Take 20 mg by mouth daily.   colchicine 0.6 MG tablet Take 1 tablet (0.6 mg total) by mouth daily. What changed:  when to take this reasons to take this   docusate sodium 100 MG capsule Commonly known as: Colace Take 1 capsule (100 mg total) by mouth 2 (two) times daily.   Eliquis 5 MG Tabs  tablet Generic drug: apixaban Take 5 mg by mouth 2 (two) times daily.   famotidine 20 MG tablet Commonly known as: PEPCID Take 20 mg by mouth 2 (two) times daily.   furosemide 20 MG tablet Commonly known as: LASIX Take 1 tablet (20 mg total) by mouth daily as needed (weight gain >3 lbs in 1 day and >5 lbs in 2 days).   glipiZIDE 5 MG 24 hr tablet Commonly known as: GLUCOTROL XL Take 5 mg by mouth daily.   insulin aspart 100 UNIT/ML injection Commonly known as: novoLOG Inject 0-9 Units into the skin 3 (three) times daily with meals.   levETIRAcetam 1000 MG tablet Commonly known as: KEPPRA Take 1,000 mg by mouth 2 (two) times daily.   lisinopril 5 MG tablet Commonly known as: ZESTRIL Take 5 mg by mouth daily.   metFORMIN 1000 MG tablet Commonly known as: GLUCOPHAGE Take 1,000 mg by mouth 2 (two) times daily.   polyethylene glycol 17 g packet Commonly known as: MIRALAX / GLYCOLAX Take 17 g by mouth 2 (two) times daily.           If you experience worsening of your admission symptoms, develop shortness of breath, life threatening emergency, suicidal or homicidal thoughts you must seek medical attention immediately by calling 911 or calling your MD immediately  if symptoms less severe.   You must read complete instructions/literature along with all the possible adverse reactions/side effects for all the medicines you take and that have been prescribed to you. Take any new medicines after you have completely understood and accept all the possible adverse reactions/side effects.    Please note   You were cared for by a hospitalist during your hospital stay. If you have any questions about your discharge medications or the care you received while you were in the hospital after you are discharged, you can call the unit and asked to speak with the hospitalist on call if the hospitalist  that took care of you is not available. Once you are discharged, your primary care physician  will handle any further medical issues. Please note that NO REFILLS for any discharge medications will be authorized once you are discharged, as it is imperative that you return to your primary care physician (or establish a relationship with a primary care physician if you do not have one) for your aftercare needs so that they can reassess your need for medications and monitor your lab values.       Time coordinating discharge: 32 minutes  Signed:  Icholas Irby  [facility] 02/11/2021, 3:49 PM   Pager on [email or web address]. If 7PM-7AM, please contact night-coverage at [email or web address]

## 2021-02-11 NOTE — Progress Notes (Signed)
Nsg Discharge Note  Admit Date:  02/09/2021 Discharge date: 02/11/2021   Traci Carter to be D/C'd Home per MD order.  AVS completed.  Copy for chart, and copy for patient signed, and dated. Patient/caregiver able to verbalize understanding.  Discharge Medication: Allergies as of 02/11/2021       Reactions   Codeine Other (See Comments)   GI Distress GI Distress GI Distress   Codeine    Digoxin    Other reaction(s): Unknown   Lanoxin [digoxin]    Penicillins    Zoloft [sertraline Hcl]    Penicillins Rash, Other (See Comments)        Medication List     STOP taking these medications    cephALEXin 500 MG capsule Commonly known as: KEFLEX   DULoxetine 60 MG capsule Commonly known as: CYMBALTA   oxyCODONE-acetaminophen 5-325 MG tablet Commonly known as: PERCOCET/ROXICET   pregabalin 50 MG capsule Commonly known as: Lyrica   zolpidem 10 MG tablet Commonly known as: AMBIEN       TAKE these medications    acetaminophen 325 MG tablet Commonly known as: TYLENOL Take 2 tablets (650 mg total) by mouth every 6 (six) hours as needed for mild pain, headache, fever or moderate pain.   albuterol 108 (90 Base) MCG/ACT inhaler Commonly known as: VENTOLIN HFA Inhale 2 puffs into the lungs every 6 (six) hours as needed for wheezing or shortness of breath.   albuterol (2.5 MG/3ML) 0.083% nebulizer solution Commonly known as: PROVENTIL Inhale 3 mLs into the lungs every 4 (four) hours as needed for wheezing or shortness of breath.   allopurinol 100 MG tablet Commonly known as: ZYLOPRIM Take 100 mg by mouth daily.   atorvastatin 20 MG tablet Commonly known as: LIPITOR Take 20 mg by mouth daily.   colchicine 0.6 MG tablet Take 1 tablet (0.6 mg total) by mouth daily. What changed:  when to take this reasons to take this   docusate sodium 100 MG capsule Commonly known as: Colace Take 1 capsule (100 mg total) by mouth 2 (two) times daily.   Eliquis 5 MG Tabs  tablet Generic drug: apixaban Take 5 mg by mouth 2 (two) times daily.   famotidine 20 MG tablet Commonly known as: PEPCID Take 20 mg by mouth 2 (two) times daily.   furosemide 20 MG tablet Commonly known as: LASIX Take 1 tablet (20 mg total) by mouth daily as needed (weight gain >3 lbs in 1 day and >5 lbs in 2 days).   glipiZIDE 5 MG 24 hr tablet Commonly known as: GLUCOTROL XL Take 5 mg by mouth daily.   insulin aspart 100 UNIT/ML injection Commonly known as: novoLOG Inject 0-9 Units into the skin 3 (three) times daily with meals.   levETIRAcetam 1000 MG tablet Commonly known as: KEPPRA Take 1,000 mg by mouth 2 (two) times daily.   lisinopril 5 MG tablet Commonly known as: ZESTRIL Take 5 mg by mouth daily.   metFORMIN 1000 MG tablet Commonly known as: GLUCOPHAGE Take 1,000 mg by mouth 2 (two) times daily.   polyethylene glycol 17 g packet Commonly known as: MIRALAX / GLYCOLAX Take 17 g by mouth 2 (two) times daily.        Discharge Assessment: Vitals:   02/11/21 0756 02/11/21 1540  BP: (!) 136/53 133/82  Pulse: (!) 110 (!) 105  Resp: 18 15  Temp: 97.9 F (36.6 C) 98 F (36.7 C)  SpO2: 98% 92%   Skin clean, dry and intact  without evidence of skin break down, no evidence of skin tears noted. IV catheter discontinued intact. Site without signs and symptoms of complications - no redness or edema noted at insertion site, patient denies c/o pain - only slight tenderness at site.  Dressing with slight pressure applied.  D/c Instructions-Education: Discharge instructions given to patient/family with verbalized understanding. D/c education completed with patient/family including follow up instructions, medication list, d/c activities limitations if indicated, with other d/c instructions as indicated by MD - patient able to verbalize understanding, all questions fully answered. Patient instructed to return to ED, call 911, or call MD for any changes in condition.   Patient escorted via [place], and D/C home via private auto.  Traci Carter, Traci Schimke, RN 02/11/2021 4:59 PM

## 2021-02-14 LAB — CULTURE, BLOOD (SINGLE)
Culture: NO GROWTH
Special Requests: ADEQUATE

## 2021-03-09 ENCOUNTER — Ambulatory Visit: Payer: Medicare Other | Admitting: Podiatry

## 2021-03-09 ENCOUNTER — Encounter (INDEPENDENT_AMBULATORY_CARE_PROVIDER_SITE_OTHER): Payer: Self-pay

## 2021-03-09 ENCOUNTER — Other Ambulatory Visit: Payer: Self-pay

## 2021-03-09 DIAGNOSIS — B351 Tinea unguium: Secondary | ICD-10-CM

## 2021-03-09 DIAGNOSIS — M79675 Pain in left toe(s): Secondary | ICD-10-CM | POA: Diagnosis not present

## 2021-03-09 DIAGNOSIS — M7751 Other enthesopathy of right foot: Secondary | ICD-10-CM

## 2021-03-09 DIAGNOSIS — M79674 Pain in right toe(s): Secondary | ICD-10-CM

## 2021-03-09 DIAGNOSIS — M7752 Other enthesopathy of left foot: Secondary | ICD-10-CM

## 2021-03-09 MED ORDER — BETAMETHASONE SOD PHOS & ACET 6 (3-3) MG/ML IJ SUSP
3.0000 mg | Freq: Once | INTRAMUSCULAR | Status: AC
  Administered 2021-03-09: 3 mg via INTRA_ARTICULAR

## 2021-03-09 NOTE — Progress Notes (Signed)
   SUBJECTIVE Patient with a history of diabetes mellitus presents to office today complaining of elongated, thickened nails that cause pain while ambulating in shoes.  Patient is unable to trim their own nails.   Patient states that she is also been experiencing significant pain and tenderness to the bilateral feet.  She states that she has been diagnosed with gout in the past.  She states that it time she has been admitted into the hospital for pain management regarding the bilateral foot pain.  This was when she was diagnosed with gout.  She received injections last visit on 12/15/2020 which she says helped.  She presents for further treatment and evaluation patient is here for further evaluation and treatment.   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 Derm Skin is dry and supple bilateral. Negative open lesions or macerations. Remaining integument unremarkable. Nails are tender, long, thickened and dystrophic with subungual debris, consistent with onychomycosis, 1-5 bilateral. No signs of infection noted. Vasc  DP and PT pedal pulses palpable bilaterally. Temperature gradient within normal limits.  Neuro Epicritic and protective threshold sensation diminished bilaterally.  Musculoskeletal Exam No symptomatic pedal deformities noted bilateral. Muscular strength within normal limits.  Significant pain on palpation and tenderness to the bilateral ankle joints  ASSESSMENT 1. Diabetes Mellitus w/ peripheral neuropathy 2.  Pain due to onychomycosis of toenails bilateral 3.  DJD/capsulitis bilateral ankles  PLAN OF CARE 1. Patient evaluated  today.  Comprehensive diabetic foot exam performed today 2. Instructed to maintain good pedal hygiene and foot care. Stressed importance of controlling blood sugar.  3. Mechanical debridement of nails 1-5 bilaterally performed using a nail nipper. Filed with dremel without incident.  4.  Injection of 0.5 cc Celestone Soluspan injected into the bilateral ankle joints  5.  Patient requesting pain medication to date.  Today I explained to the pain medication needs to either come from the PCP or pain management physician.  Patient understands and will follow up with her PCP 6.  Return to clinic in 4 weeks    Edrick Kins, DPM [facility]  Dr. Edrick Kins, DPM    [address], [place] [address]                Office [phone]  Fax [phone]

## 2021-05-04 ENCOUNTER — Ambulatory Visit: Payer: Self-pay | Admitting: Internal Medicine

## 2021-06-08 ENCOUNTER — Ambulatory Visit: Payer: Medicare Other | Admitting: Podiatry

## 2021-06-30 ENCOUNTER — Other Ambulatory Visit: Payer: Self-pay

## 2021-06-30 ENCOUNTER — Ambulatory Visit
Admission: RE | Admit: 2021-06-30 | Discharge: 2021-06-30 | Disposition: A | Discharge status: Home / Self Care | Payer: Medicare Other | Source: Ambulatory Visit | Attending: Internal Medicine | Admitting: Internal Medicine

## 2021-06-30 DIAGNOSIS — Z1231 Encounter for screening mammogram for malignant neoplasm of breast: Secondary | ICD-10-CM | POA: Diagnosis not present

## 2021-06-30 DIAGNOSIS — E2839 Other primary ovarian failure: Secondary | ICD-10-CM | POA: Insufficient documentation

## 2021-08-24 ENCOUNTER — Ambulatory Visit: Payer: Medicare Other | Admitting: Podiatry

## 2021-09-24 ENCOUNTER — Ambulatory Visit: Payer: Medicare Other | Admitting: Podiatry

## 2021-09-24 DIAGNOSIS — E0843 Diabetes mellitus due to underlying condition with diabetic autonomic (poly)neuropathy: Secondary | ICD-10-CM

## 2021-09-24 DIAGNOSIS — M79675 Pain in left toe(s): Secondary | ICD-10-CM

## 2021-09-24 DIAGNOSIS — M2042 Other hammer toe(s) (acquired), left foot: Secondary | ICD-10-CM

## 2021-09-24 DIAGNOSIS — M79674 Pain in right toe(s): Secondary | ICD-10-CM | POA: Diagnosis not present

## 2021-09-24 DIAGNOSIS — B351 Tinea unguium: Secondary | ICD-10-CM | POA: Diagnosis not present

## 2021-09-24 DIAGNOSIS — M2041 Other hammer toe(s) (acquired), right foot: Secondary | ICD-10-CM

## 2021-10-15 ENCOUNTER — Ambulatory Visit (INDEPENDENT_AMBULATORY_CARE_PROVIDER_SITE_OTHER): Payer: Medicare Other | Admitting: Podiatry

## 2021-10-15 DIAGNOSIS — E0843 Diabetes mellitus due to underlying condition with diabetic autonomic (poly)neuropathy: Secondary | ICD-10-CM

## 2021-10-15 NOTE — Progress Notes (Signed)
Patient presents for a Diabetic shoe measurement.  We will order her a size 9 wide Orthofeet [name] shoes and inserts.  I informed her that we will start the process of getting the authorization.  We will call her once we get the shoes and inserts back and schedule her an appointment.

## 2022-01-17 IMAGING — CR DG ANKLE COMPLETE 3+V*L*
3 series · 3 of 3 positions shown · non-contrast
Comparison: None.

CLINICAL DATA: Fall, bilateral leg pain

EXAM:
LEFT ANKLE COMPLETE - 3+ VIEW

[ankle ap]
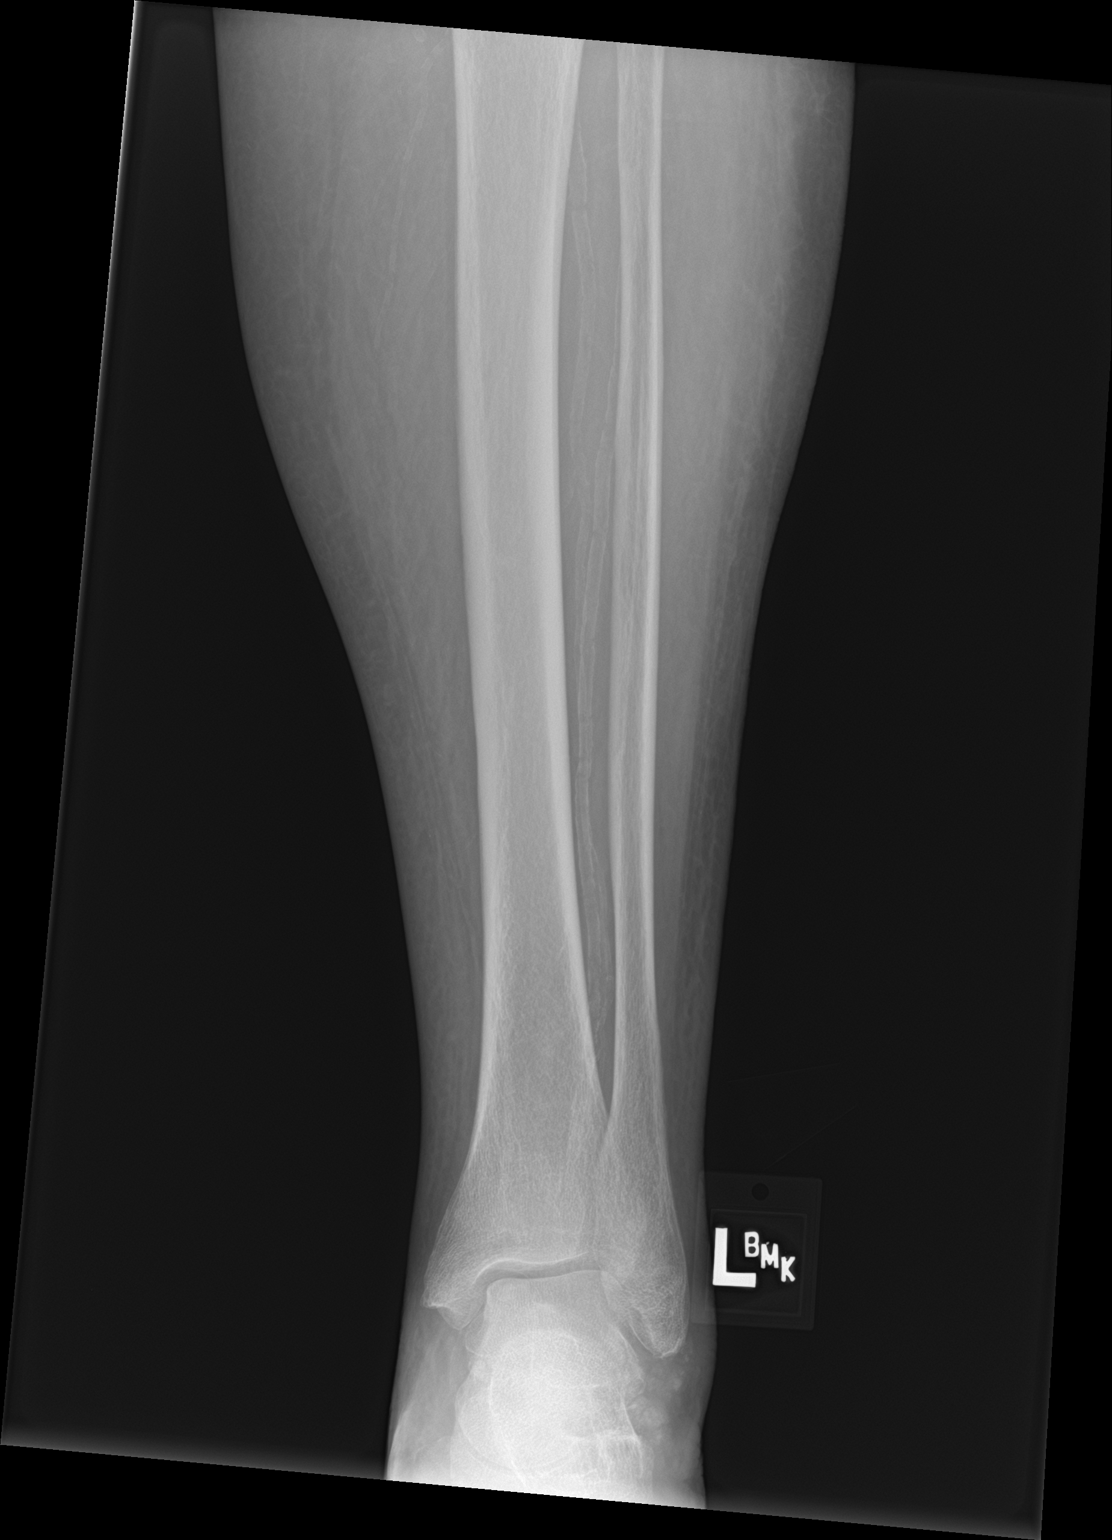

[ankle obl]
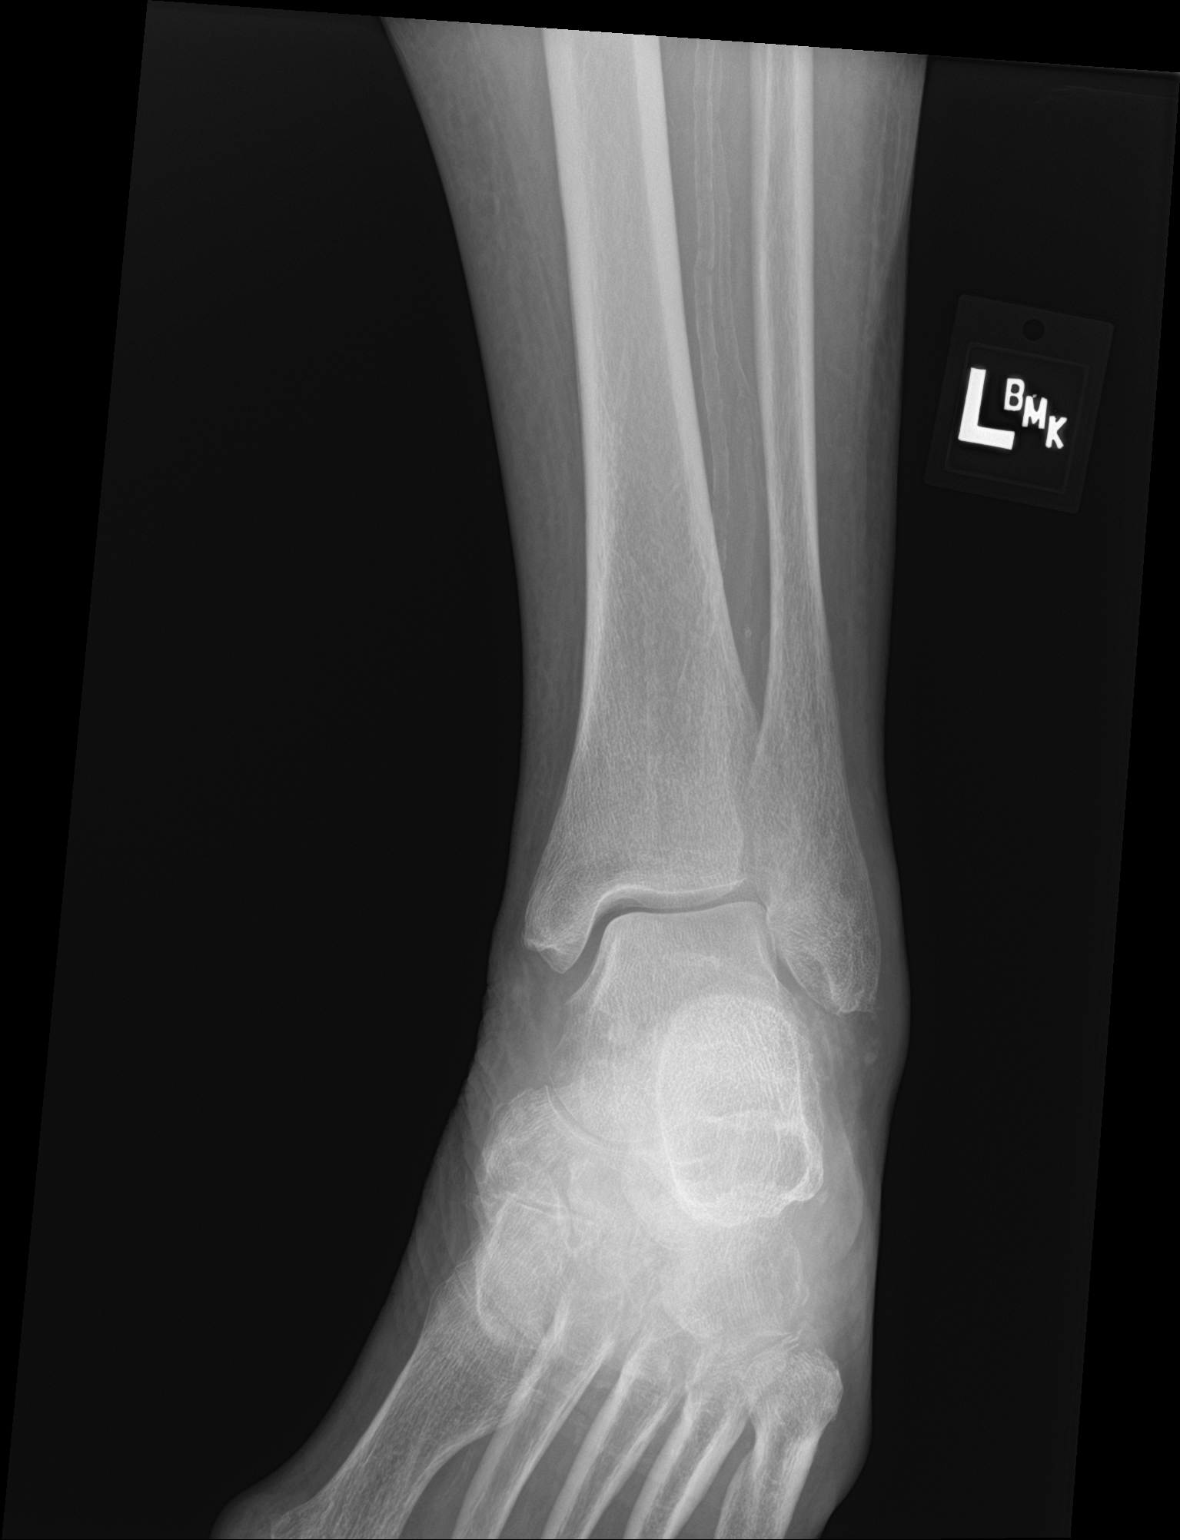

[ankle lat]
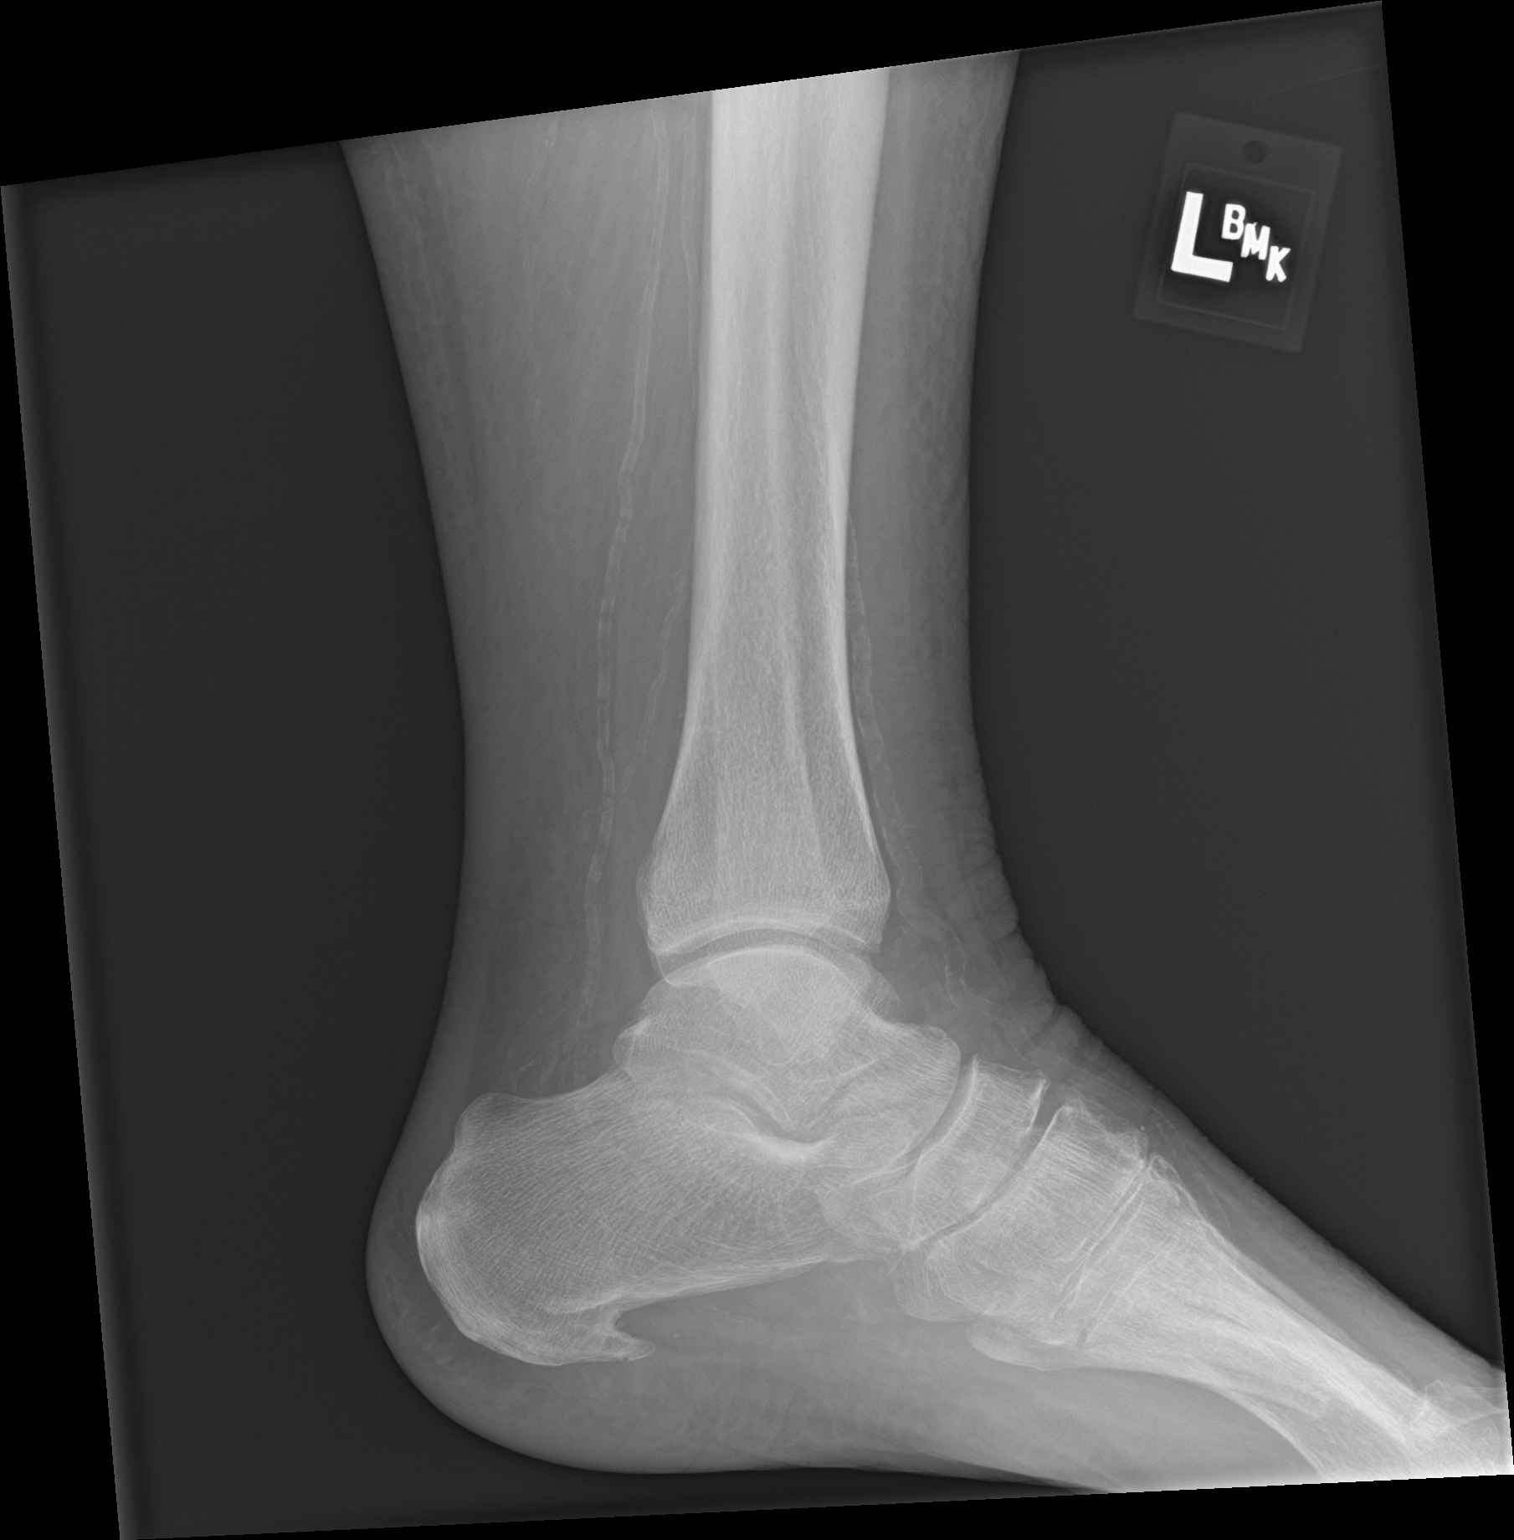

[3 of 3 positions shown; findings below may reference images not displayed]

FINDINGS: No fracture or dislocation is seen.

The ankle mortise is intact.

Small plantar calcaneal enthesophyte.

Visualized soft tissues are within normal limits.
IMPRESSION: Negative.

## 2022-01-17 IMAGING — CR DG FOOT COMPLETE 3+V*R*
3 series · 3 of 3 positions shown · non-contrast
Comparison: None.

CLINICAL DATA: Fall, bilateral leg pain

EXAM:
RIGHT FOOT COMPLETE - 3+ VIEW

[foot ap]
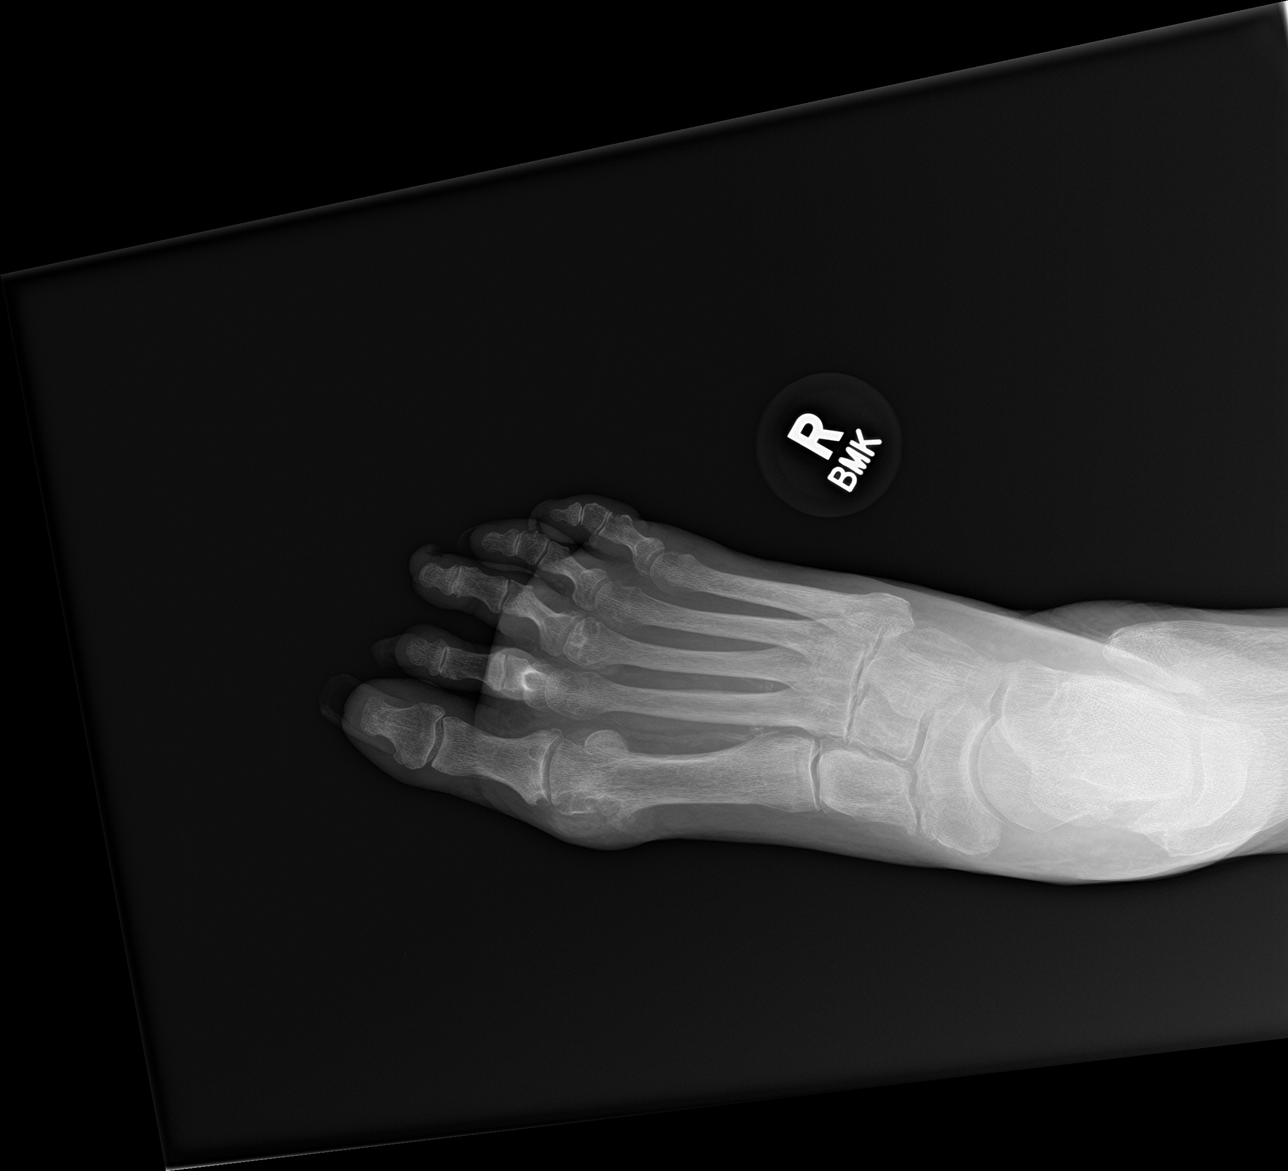

[foot obl]
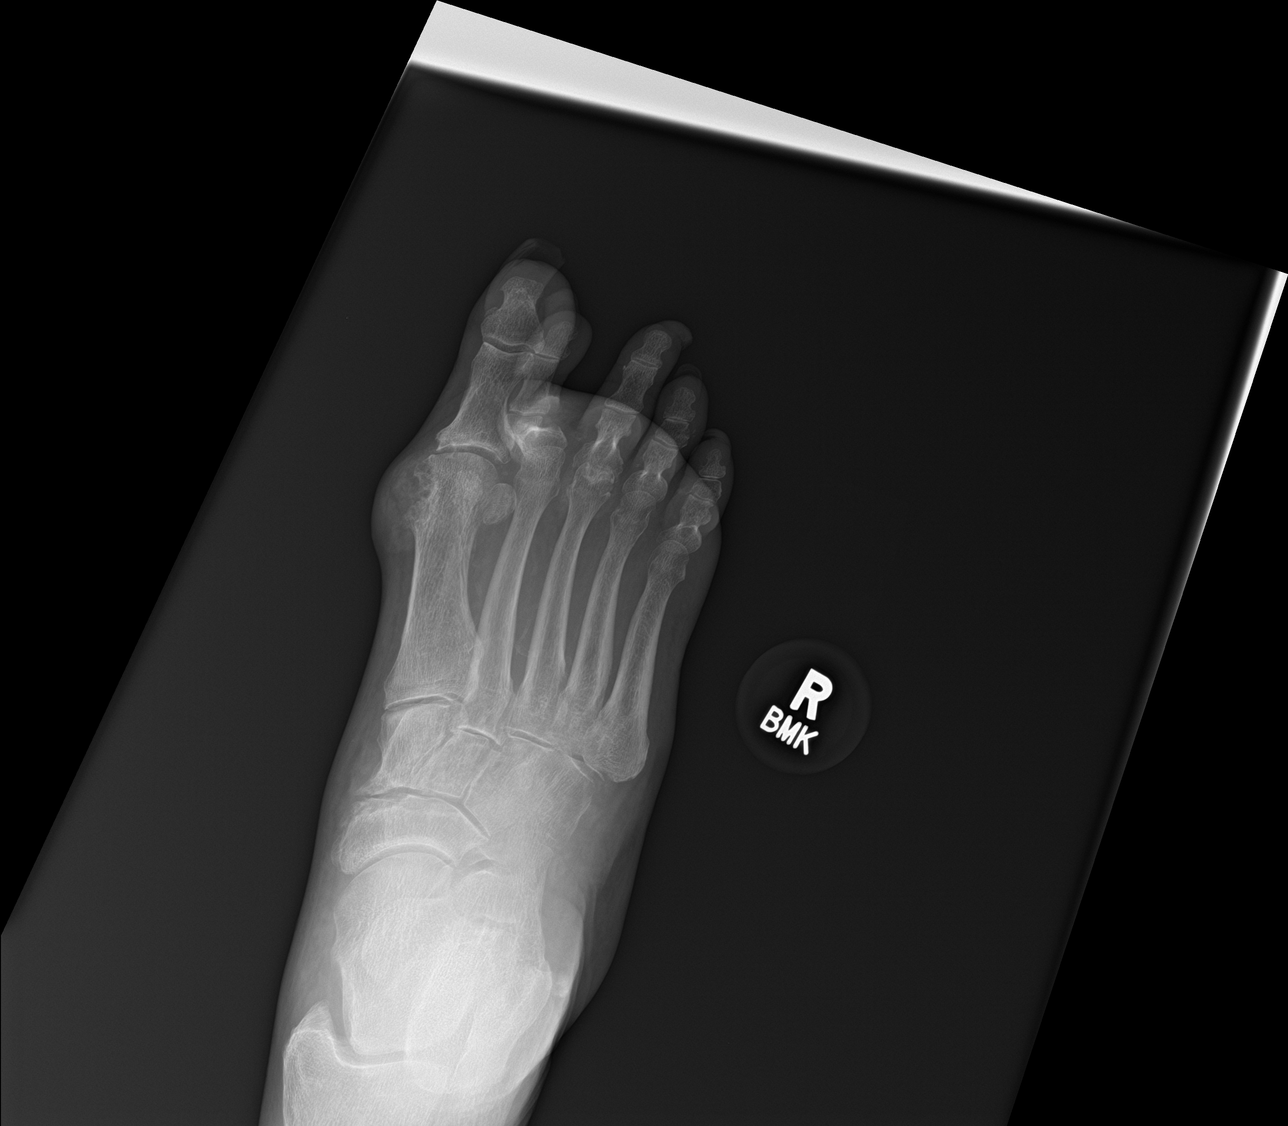

[foot lat]
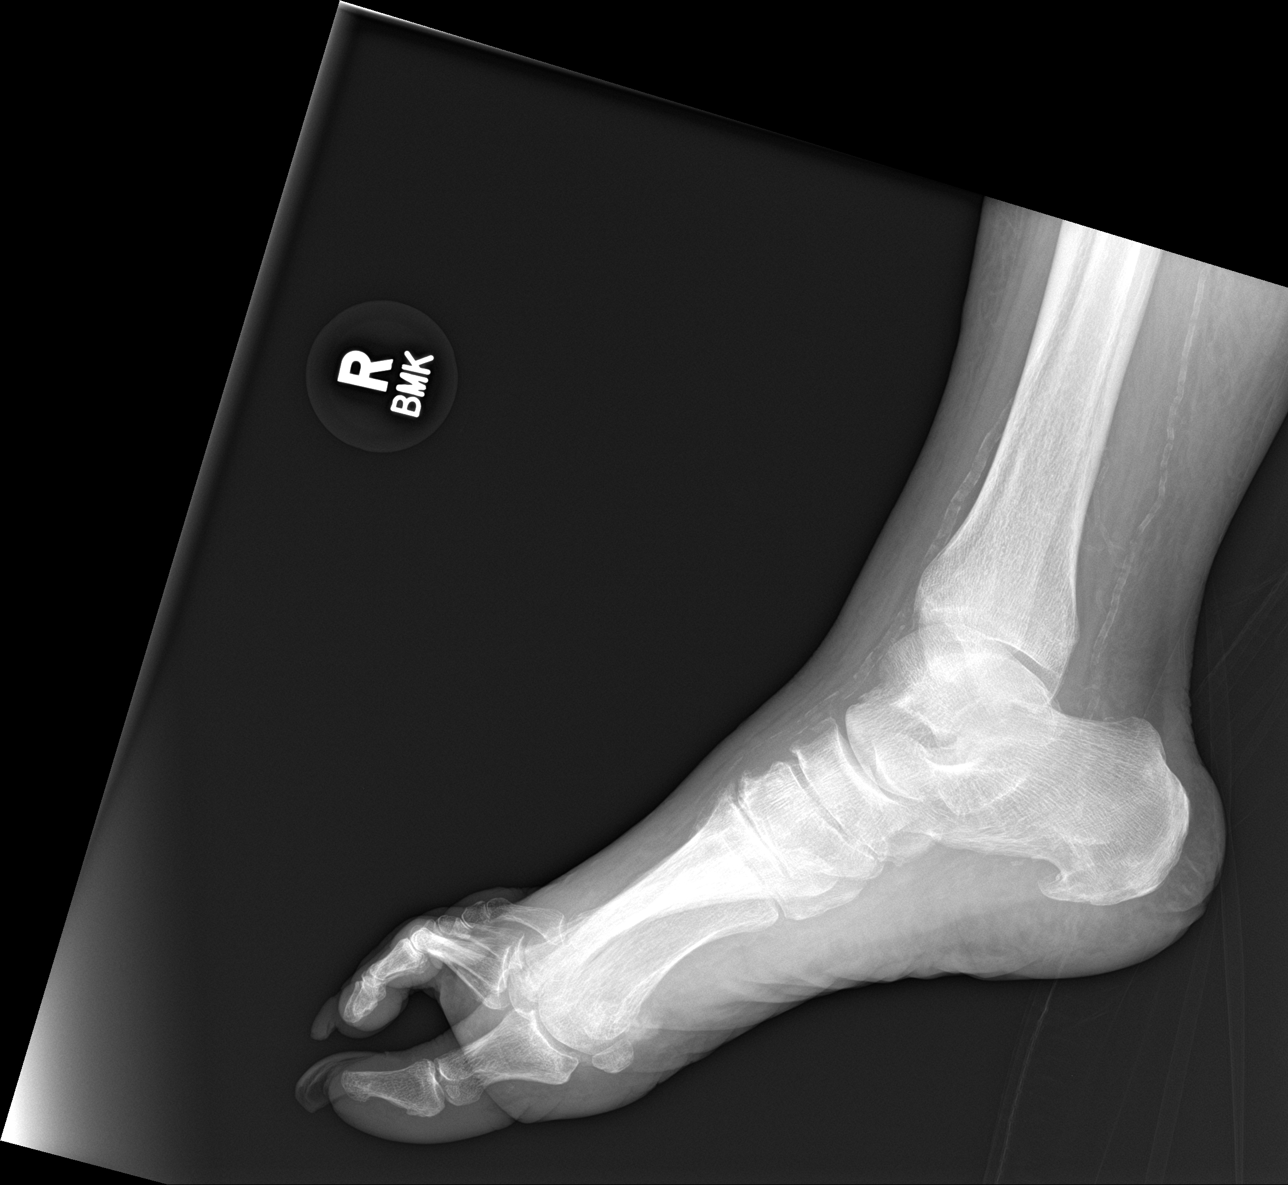

[3 of 3 positions shown; findings below may reference images not displayed]

FINDINGS: No fracture or dislocation is seen.

Degenerative changes of the 1st MTP joint with marginal erosions,
raising the possibility of gout.

Small plantar calcaneal enthesophyte.
IMPRESSION: No fracture or dislocation is seen.

Erosive arthropathy involving the 1st MTP joint, correlate for gout.

## 2022-04-01 ENCOUNTER — Ambulatory Visit: Payer: Medicare Other | Admitting: Podiatry

## 2022-04-20 ENCOUNTER — Other Ambulatory Visit: Payer: Self-pay

## 2022-04-20 ENCOUNTER — Telehealth: Payer: Self-pay

## 2022-04-20 DIAGNOSIS — Z1211 Encounter for screening for malignant neoplasm of colon: Secondary | ICD-10-CM

## 2022-04-20 NOTE — Telephone Encounter (Signed)
Patient is due for their 5 year follow up colonoscopy. Last one done 06/28/17 by Dr Bary Castilla. She would like a referral to [facility] to have this done. Referral has been placed and patient is aware that they will call her for this.

## 2022-05-27 ENCOUNTER — Other Ambulatory Visit: Payer: Self-pay | Admitting: Internal Medicine

## 2022-05-27 DIAGNOSIS — Z1231 Encounter for screening mammogram for malignant neoplasm of breast: Secondary | ICD-10-CM

## 2022-07-12 ENCOUNTER — Ambulatory Visit: Payer: Medicare Other | Admitting: Podiatry

## 2022-08-04 NOTE — Progress Notes (Signed)
 Established Patient Visit   Chief Complaint: Chief Complaint  Patient presents with  . Pacer-ICD  . Atrial Fibrillation  . Hypertension   Date of Service: 08/04/2022 Date of Birth: May 02, 1945 PCP: Corlis Honor [name], MD  History of Present Illness: [name] is a 77 y.o.female patient who returns for   1.  Paroxysmal atrial fibrillation  2.  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  Essential hypertension  4.  Hyperlipidemia  5.  Type 2 diabetes  6.  Status post CVA   2D echocardiogram on 01/07/2019 revealed normal left ventricular function, with LVEF greater than 55% with mild mitral and tricuspid regurgitation.    The patient returns today for a 31-month follow-up and reports doing well, about the same. She denies exertional chest pain. She reports postprandial chest discomfort occasionally that resolves with Tums. She has chronic exertional shortness of breath, which is unchanged.  She is not very active and ambulates with a walker.  She denies peripheral edema.  She denies palpitations or heart racing.  She denies presyncope or syncope.   Pacemaker interrogation on 05/04/2022 revealed normal pacemaker function with a longevity of 23 months with underlying atrial fibrillation.  The patient has paroxysmal atrial fibrillation with a chads vasc score of 6, with prior history of CVA, currently on Eliquis  for stroke prevention. She denies palpitations or heart racing. She denies melena or hematochezia.  The patient has essential hypertension, blood pressure well controlled today, currently on lisinopril  and furosemide , which are tolerated well without apparent side effects.   The patient has hyperlipidemia, currently on atorvastatin , which is well tolerated without apparent side effects, followed by her primary care provider.    Past Medical and Surgical History  Past Medical History Past Medical History:  Diagnosis Date  . Arthritis   . 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  SABRA Atrial fibrillation (CMS-HCC)    Intermittent afib, brady arrhythmias.  . Chest pain   . Coronary disease    MR and TR:  echo 11/20/03 shows low normal LV systolic function with EF 50%, mild biatrial enlargement, moderate mitral and moderately severe tricuspid regurgitation.   . CVA (cerebral infarction) (CMS-HCC)   . Diabetes mellitus type 2, uncomplicated (CMS-HCC)   . Fibromyalgia   . History of stroke   . Hyperlipidemia   . Hypertension   . Sleep apnea    mild to moderate    Past Surgical History She has a past surgical history that includes Insert / replace / remove pacemaker (12/04/2003); hysterectomy vaginal; Endoscopic Carpal Tunnel Release (Bilateral); Nirschl  procedure; Replacement total knee bilateral; Cholecystectomy; and Arthroscopy Shoulder (Right).   Medications and Allergies  Current Medications  Current Outpatient Medications  Medication Sig Dispense Refill  . allopurinoL  (ZYLOPRIM ) 100 MG tablet Take 100 mg by mouth once daily    . atorvastatin  (LIPITOR) 20 MG tablet Take by mouth.    . colchicine  (COLCRYS ) 0.6 mg tablet Take 1 tablet twice daily while under a gout flare. 30 tablet 0  . ELIQUIS  5 mg tablet Take 5 mg by mouth every 12 (twelve) hours  11  . famotidine  (PEPCID ) 20 MG tablet TK 1 T PO QD    . fluticasone (FLONASE) 50 mcg/actuation nasal spray   0  . FUROsemide  (LASIX ) 20 MG tablet Take 1 tablet by mouth once daily    . glipiZIDE  (GLUCOTROL ) 5 MG tablet   5  . levETIRAcetam  (KEPPRA ) 1000 MG tablet 500  mg 2 (two) times daily.    11  . lisinopril  (PRINIVIL ,ZESTRIL ) 5 MG tablet   11  . metFORMIN  (GLUCOPHAGE ) 500 MG tablet Take 1,000 mg by mouth 2 (two) times daily with meals    . montelukast  (SINGULAIR ) 10 mg tablet   5  . PROAIR  HFA 90 mcg/actuation inhaler   5   No current facility-administered medications for this visit.    Allergies: Codeine, Digoxin, and Penicillins  Social and  Family History  Social History  reports that she has never smoked. She has never used smokeless tobacco. She reports that she does not drink alcohol and does not use drugs.  Family History Family History  Problem Relation Name Age of Onset  . Coronary Artery Disease (Blocked arteries around heart) Mother    . Heart disease Mother    . Breast cancer Mother    . Hyperlipidemia (Elevated cholesterol) Mother    . Prostate cancer Father    . Cancer Father    . Heart disease Brother    . High blood pressure (Hypertension) Brother    . Hyperlipidemia (Elevated cholesterol) Brother    . Diabetes Brother    . Diabetes Sister    . Depression Sister      Review of Systems   Review of Systems: The patient denies chest pain, reports chronic exertional shortness of breath, without orthopnea, paroxysmal nocturnal dyspnea, pedal edema, palpitations, heart racing, presyncope, syncope, with fatigue, with anxiety, with gait instability, with dyspepsia. Review of 8 Systems is negative except as described above.  Physical Examination   Vitals: BP 110/70   Pulse 90   Ht 160 cm (5' 3)   Wt (!) 105.2 kg (232 lb)   SpO2 95%   BMI 41.10 kg/m  Ht:160 cm (5' 3) Wt:(!) 105.2 kg (232 lb) ADJ:Anib surface area is 2.16 meters squared. Body mass index is 41.1 kg/m.  General: Alert and oriented. Well appearing. No acute distress. HEENT: Pupils equally reactive to light and accomodation    Neck: Supple, no JVD Lungs: Normal effort of breathing; clear to auscultation bilaterally; no wheezes, rales, rhonchi Heart: irregularly irregular.. No murmur, rub, or gallop Abdomen: nondistended, with normal bowel sounds Extremities: no cyanosis, clubbing, or edema. Dry, scaly skin of legs Peripheral Pulses: 2+ radial  Skin: Warm, dry, no diaphoresis  Assessment   77 y.o. female with  1. Paroxysmal atrial fibrillation (CMS/HHS-HCC)   2. Essential hypertension   3. Pacemaker   4. Mixed hyperlipidemia   5.  Morbid obesity, BMI not known (CMS/HHS-HCC)   6. History of embolic stroke     77 year old female with paroxysmal atrial fibrillation, chads vasc score 6, currently on Eliquis  for stroke prevention.  Patient has dual-chamber pacemaker for complete heart block with pacemaker interrogation revealing normal pacemaker function with underlying rhythm of atrial fibrillation.  The patient has essential hypertension, blood pressure well controlled today on current BP medications.    Plan   1.  Continue current medications 2.  Continue Eliquis  for stroke prevention 3.  Recommend low sodium diet 4.  Continue atorvastatin  for hyperlipidemia management 5.  Pacemaker check as scheduled  6.  Return to clinic for follow-up in 6 months  No orders of the defined types were placed in this encounter.   Return in about 6 months (around 02/04/2023).   Attestation Statement:   I personally performed the service, non-incident to. (WP)   ANNA MARIA DRANE, PA-C

## 2022-09-08 ENCOUNTER — Ambulatory Visit
Admission: RE | Admit: 2022-09-08 | Discharge: 2022-09-08 | Disposition: A | Discharge status: Home / Self Care | Payer: Medicare Other | Source: Ambulatory Visit | Attending: Internal Medicine | Admitting: Internal Medicine

## 2022-09-08 DIAGNOSIS — Z1231 Encounter for screening mammogram for malignant neoplasm of breast: Secondary | ICD-10-CM | POA: Insufficient documentation

## 2023-10-16 ENCOUNTER — Emergency Department

## 2023-10-16 ENCOUNTER — Inpatient Hospital Stay
Admission: EM | Admit: 2023-10-16 | Discharge: 2023-10-30 | DRG: 871 | Disposition: A | Discharge status: Skilled Nursing Facility | Attending: Internal Medicine | Admitting: Internal Medicine

## 2023-10-16 DIAGNOSIS — R627 Adult failure to thrive: Secondary | ICD-10-CM | POA: Diagnosis present

## 2023-10-16 DIAGNOSIS — E872 Acidosis, unspecified: Secondary | ICD-10-CM

## 2023-10-16 DIAGNOSIS — E860 Dehydration: Secondary | ICD-10-CM | POA: Diagnosis present

## 2023-10-16 DIAGNOSIS — B952 Enterococcus as the cause of diseases classified elsewhere: Secondary | ICD-10-CM | POA: Diagnosis present

## 2023-10-16 DIAGNOSIS — R4182 Altered mental status, unspecified: Secondary | ICD-10-CM | POA: Diagnosis present

## 2023-10-16 DIAGNOSIS — I480 Paroxysmal atrial fibrillation: Secondary | ICD-10-CM | POA: Diagnosis present

## 2023-10-16 DIAGNOSIS — Z885 Allergy status to narcotic agent status: Secondary | ICD-10-CM

## 2023-10-16 DIAGNOSIS — Z794 Long term (current) use of insulin: Secondary | ICD-10-CM

## 2023-10-16 DIAGNOSIS — Z950 Presence of cardiac pacemaker: Secondary | ICD-10-CM

## 2023-10-16 DIAGNOSIS — E875 Hyperkalemia: Secondary | ICD-10-CM | POA: Diagnosis present

## 2023-10-16 DIAGNOSIS — E8721 Acute metabolic acidosis: Secondary | ICD-10-CM | POA: Diagnosis present

## 2023-10-16 DIAGNOSIS — I130 Hypertensive heart and chronic kidney disease with heart failure and stage 1 through stage 4 chronic kidney disease, or unspecified chronic kidney disease: Secondary | ICD-10-CM | POA: Diagnosis present

## 2023-10-16 DIAGNOSIS — N939 Abnormal uterine and vaginal bleeding, unspecified: Secondary | ICD-10-CM | POA: Diagnosis present

## 2023-10-16 DIAGNOSIS — R6521 Severe sepsis with septic shock: Secondary | ICD-10-CM | POA: Diagnosis present

## 2023-10-16 DIAGNOSIS — A419 Sepsis, unspecified organism: Principal | ICD-10-CM | POA: Diagnosis present

## 2023-10-16 DIAGNOSIS — F0394 Unspecified dementia, unspecified severity, with anxiety: Secondary | ICD-10-CM | POA: Diagnosis present

## 2023-10-16 DIAGNOSIS — R410 Disorientation, unspecified: Secondary | ICD-10-CM | POA: Diagnosis not present

## 2023-10-16 DIAGNOSIS — E1142 Type 2 diabetes mellitus with diabetic polyneuropathy: Secondary | ICD-10-CM | POA: Diagnosis present

## 2023-10-16 DIAGNOSIS — I255 Ischemic cardiomyopathy: Secondary | ICD-10-CM | POA: Diagnosis present

## 2023-10-16 DIAGNOSIS — R571 Hypovolemic shock: Secondary | ICD-10-CM | POA: Diagnosis present

## 2023-10-16 DIAGNOSIS — E669 Obesity, unspecified: Secondary | ICD-10-CM | POA: Diagnosis present

## 2023-10-16 DIAGNOSIS — I2510 Atherosclerotic heart disease of native coronary artery without angina pectoris: Secondary | ICD-10-CM | POA: Diagnosis present

## 2023-10-16 DIAGNOSIS — R5381 Other malaise: Secondary | ICD-10-CM | POA: Diagnosis present

## 2023-10-16 DIAGNOSIS — J4489 Other specified chronic obstructive pulmonary disease: Secondary | ICD-10-CM | POA: Diagnosis present

## 2023-10-16 DIAGNOSIS — Z888 Allergy status to other drugs, medicaments and biological substances status: Secondary | ICD-10-CM

## 2023-10-16 DIAGNOSIS — E785 Hyperlipidemia, unspecified: Secondary | ICD-10-CM | POA: Diagnosis present

## 2023-10-16 DIAGNOSIS — N179 Acute kidney failure, unspecified: Secondary | ICD-10-CM | POA: Diagnosis present

## 2023-10-16 DIAGNOSIS — Z880 Allergy status to penicillin: Secondary | ICD-10-CM

## 2023-10-16 DIAGNOSIS — R296 Repeated falls: Secondary | ICD-10-CM | POA: Diagnosis present

## 2023-10-16 DIAGNOSIS — N1831 Chronic kidney disease, stage 3a: Secondary | ICD-10-CM | POA: Diagnosis present

## 2023-10-16 DIAGNOSIS — G4733 Obstructive sleep apnea (adult) (pediatric): Secondary | ICD-10-CM | POA: Diagnosis present

## 2023-10-16 DIAGNOSIS — I509 Heart failure, unspecified: Secondary | ICD-10-CM | POA: Diagnosis present

## 2023-10-16 DIAGNOSIS — N390 Urinary tract infection, site not specified: Secondary | ICD-10-CM | POA: Diagnosis present

## 2023-10-16 DIAGNOSIS — E1122 Type 2 diabetes mellitus with diabetic chronic kidney disease: Secondary | ICD-10-CM | POA: Diagnosis present

## 2023-10-16 DIAGNOSIS — G9341 Metabolic encephalopathy: Secondary | ICD-10-CM | POA: Diagnosis present

## 2023-10-16 DIAGNOSIS — Z5982 Transportation insecurity: Secondary | ICD-10-CM

## 2023-10-16 DIAGNOSIS — K219 Gastro-esophageal reflux disease without esophagitis: Secondary | ICD-10-CM | POA: Diagnosis present

## 2023-10-16 DIAGNOSIS — Z90710 Acquired absence of both cervix and uterus: Secondary | ICD-10-CM

## 2023-10-16 DIAGNOSIS — M1A9XX0 Chronic gout, unspecified, without tophus (tophi): Secondary | ICD-10-CM | POA: Diagnosis present

## 2023-10-16 DIAGNOSIS — E66813 Obesity, class 3: Secondary | ICD-10-CM | POA: Diagnosis present

## 2023-10-16 DIAGNOSIS — F0393 Unspecified dementia, unspecified severity, with mood disturbance: Secondary | ICD-10-CM | POA: Diagnosis present

## 2023-10-16 DIAGNOSIS — E876 Hypokalemia: Secondary | ICD-10-CM | POA: Diagnosis present

## 2023-10-16 DIAGNOSIS — Z803 Family history of malignant neoplasm of breast: Secondary | ICD-10-CM

## 2023-10-16 DIAGNOSIS — G40909 Epilepsy, unspecified, not intractable, without status epilepticus: Secondary | ICD-10-CM | POA: Diagnosis present

## 2023-10-16 DIAGNOSIS — R197 Diarrhea, unspecified: Secondary | ICD-10-CM | POA: Diagnosis present

## 2023-10-16 DIAGNOSIS — Z8042 Family history of malignant neoplasm of prostate: Secondary | ICD-10-CM

## 2023-10-16 DIAGNOSIS — Z8673 Personal history of transient ischemic attack (TIA), and cerebral infarction without residual deficits: Secondary | ICD-10-CM

## 2023-10-16 DIAGNOSIS — Z79899 Other long term (current) drug therapy: Secondary | ICD-10-CM

## 2023-10-16 DIAGNOSIS — Z7984 Long term (current) use of oral hypoglycemic drugs: Secondary | ICD-10-CM

## 2023-10-16 DIAGNOSIS — M797 Fibromyalgia: Secondary | ICD-10-CM | POA: Diagnosis present

## 2023-10-16 DIAGNOSIS — Z7901 Long term (current) use of anticoagulants: Secondary | ICD-10-CM

## 2023-10-16 DIAGNOSIS — Z6841 Body Mass Index (BMI) 40.0 and over, adult: Secondary | ICD-10-CM

## 2023-10-16 DIAGNOSIS — N170 Acute kidney failure with tubular necrosis: Secondary | ICD-10-CM | POA: Insufficient documentation

## 2023-10-16 LAB — URINALYSIS, W/ REFLEX TO CULTURE (INFECTION SUSPECTED)
Bilirubin Urine: NEGATIVE
Glucose, UA: NEGATIVE mg/dL
Ketones, ur: NEGATIVE mg/dL
Nitrite: NEGATIVE
Protein, ur: NEGATIVE mg/dL
Specific Gravity, Urine: 1.01 (ref 1.005–1.030)
WBC, UA: >50 WBC/hpf (ref 0–5)
pH: 5 (ref 5.0–8.0)

## 2023-10-16 LAB — COMPREHENSIVE METABOLIC PANEL WITH GFR
ALT: 17 U/L (ref 0–44)
AST: 32 U/L (ref 15–41)
Albumin: 3.4 g/dL — ABNORMAL LOW (ref 3.5–5.0)
Alkaline Phosphatase: 83 U/L (ref 38–126)
Anion gap: 26 — ABNORMAL HIGH (ref 5–15)
BUN: 91 mg/dL — ABNORMAL HIGH (ref 8–23)
CO2: 10 mmol/L — ABNORMAL LOW (ref 22–32)
Calcium: 8.9 mg/dL (ref 8.9–10.3)
Chloride: 106 mmol/L (ref 98–111)
Creatinine, Ser: 8.32 mg/dL — ABNORMAL HIGH (ref 0.44–1.00)
GFR, Estimated: 5 mL/min — ABNORMAL LOW (ref >60)
Glucose, Bld: 28 mg/dL — CL (ref 70–99)
Potassium: 5.7 mmol/L — ABNORMAL HIGH (ref 3.5–5.1)
Sodium: 142 mmol/L (ref 135–145)
Total Bilirubin: 0.7 mg/dL (ref 0.0–1.2)
Total Protein: 7 g/dL (ref 6.5–8.1)

## 2023-10-16 LAB — CK: Total CK: 230 U/L (ref 38–234)

## 2023-10-16 LAB — CBC
HCT: 44.3 % (ref 36.0–46.0)
Hemoglobin: 13.9 g/dL (ref 12.0–15.0)
MCH: 31.4 pg (ref 26.0–34.0)
MCHC: 31.4 g/dL (ref 30.0–36.0)
MCV: 100 fL (ref 80.0–100.0)
Platelets: 301 K/uL (ref 150–400)
RBC: 4.43 MIL/uL (ref 3.87–5.11)
RDW: 13.8 % (ref 11.5–15.5)
WBC: 11.8 K/uL — ABNORMAL HIGH (ref 4.0–10.5)
nRBC: 0.2 % (ref 0.0–0.2)

## 2023-10-16 LAB — CBG MONITORING, ED
Glucose-Capillary: 102 mg/dL — ABNORMAL HIGH (ref 70–99)
Glucose-Capillary: <10 mg/dL — CL (ref 70–99)
Glucose-Capillary: 111 mg/dL — ABNORMAL HIGH (ref 70–99)
Glucose-Capillary: 99 mg/dL (ref 70–99)
Glucose-Capillary: 87 mg/dL (ref 70–99)

## 2023-10-16 LAB — MAGNESIUM: Magnesium: 1.5 mg/dL — ABNORMAL LOW (ref 1.7–2.4)

## 2023-10-16 LAB — T4, FREE: Free T4: 0.65 ng/dL (ref 0.61–1.12)

## 2023-10-16 LAB — TSH: TSH: 1.813 u[IU]/mL (ref 0.350–4.500)

## 2023-10-16 LAB — BRAIN NATRIURETIC PEPTIDE: B Natriuretic Peptide: 559.3 pg/mL — ABNORMAL HIGH (ref 0.0–100.0)

## 2023-10-16 LAB — PROTIME-INR
INR: 1.3 — ABNORMAL HIGH (ref 0.8–1.2)
Prothrombin Time: 17.3 s — ABNORMAL HIGH (ref 11.4–15.2)

## 2023-10-16 LAB — TROPONIN I (HIGH SENSITIVITY): Troponin I (High Sensitivity): 53 ng/L — ABNORMAL HIGH (ref <18)

## 2023-10-16 MED ORDER — DEXTROSE-SODIUM CHLORIDE 5-0.45 % IV SOLN: INTRAVENOUS | Status: DC

## 2023-10-16 MED ORDER — METRONIDAZOLE 500 MG/100ML IV SOLN
500.0000 mg | Freq: Two times a day (BID) | INTRAVENOUS | Status: AC
  Administered 2023-10-17 – 2023-10-21 (×10): 500 mg via INTRAVENOUS
  Filled 2023-10-16 (×11): qty 100

## 2023-10-16 MED ORDER — NOREPINEPHRINE 4 MG/250ML-% IV SOLN
0.0000 ug/min | INTRAVENOUS | Status: DC
  Administered 2023-10-16: 2 ug/min via INTRAVENOUS
  Administered 2023-10-17: 12 ug/min via INTRAVENOUS
  Filled 2023-10-16 (×2): qty 250

## 2023-10-16 MED ORDER — SODIUM CHLORIDE 0.9 % IV BOLUS
1000.0000 mL | Freq: Once | INTRAVENOUS | Status: AC
  Administered 2023-10-16: 1000 mL via INTRAVENOUS

## 2023-10-16 MED ORDER — SODIUM CHLORIDE 0.9 % IV SOLN
2.0000 g | INTRAVENOUS | Status: DC
  Administered 2023-10-17 – 2023-10-20 (×5): 2 g via INTRAVENOUS
  Filled 2023-10-16 (×5): qty 20

## 2023-10-16 MED ORDER — DEXTROSE 50 % IV SOLN
INTRAVENOUS | Status: AC
  Filled 2023-10-16: qty 50

## 2023-10-16 MED ORDER — ONDANSETRON HCL 4 MG/2ML IJ SOLN
4.0000 mg | Freq: Once | INTRAMUSCULAR | Status: AC
  Administered 2023-10-16: 4 mg via INTRAVENOUS
  Filled 2023-10-16: qty 2

## 2023-10-16 MED ORDER — ACETAMINOPHEN 500 MG PO TABS
ORAL_TABLET | ORAL | Status: AC
  Filled 2023-10-16: qty 2

## 2023-10-16 MED ORDER — ACETAMINOPHEN 500 MG PO TABS
1000.0000 mg | ORAL_TABLET | Freq: Once | ORAL | Status: AC
  Administered 2023-10-16: 1000 mg via ORAL

## 2023-10-16 MED ORDER — MAGNESIUM SULFATE 2 GM/50ML IV SOLN
2.0000 g | Freq: Once | INTRAVENOUS | Status: AC
  Administered 2023-10-16: 2 g via INTRAVENOUS
  Filled 2023-10-16: qty 50

## 2023-10-16 MED ORDER — CHLORHEXIDINE GLUCONATE CLOTH 2 % EX PADS
6.0000 | MEDICATED_PAD | Freq: Every day | CUTANEOUS | Status: DC
  Administered 2023-10-17 – 2023-10-29 (×11): 6 via TOPICAL

## 2023-10-16 NOTE — ED Triage Notes (Signed)
 Pt to ED from home with c/o increased confusion and N/V since her husband recently passed. Per EMS pt has an order for a palliative consult pending. Pt was recently in ED with her husband and was frequently confused and needing redirection from staff.

## 2023-10-16 NOTE — H&P (Signed)
 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 Coli and Enterococcus faecalis UTI who presented to the ED with chief complaints of altered mental status.   Per the ED report, the patient's husband is currently admitted to the hospital and was transitioned to comfort care on 10/15/23. The patient's daughter states the patient has continued to decline and has not been eating, drinking, or taking her medications. Her condition worsened over the past two days, with nausea, vomiting, and diarrhea. The patient was noted with worsening confusion and generalized weakness today, which prompted further evaluation in the ED.   ED Course: Initial vital signs showed HR of 68 beats/minute, BP 109/50 mm Hg, the RR 18 breaths/minute, and the oxygen saturation 95 % on RA and a temperature of 97.60F (36.4C). 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 Coli and Enterococcus faecalis UTI  Significant Hospital Events   7/14: Admit to ICU with altered mental status found to be septic due to UTI  Consults:  None  Procedures:  None  Interim History / Subjective:      Micro Data:  : SARS-CoV-2 PCR> negative : Influenza  PCR> negative : Blood culture x2> : Urine Culture> : MRSA PCR>>  : Strep pneumo urinary antigen> : Legionella urinary antigen>  Antimicrobials:  Vancomycin  Cefepime Azithromycin Ceftriaxone  Metronidazole   OBJECTIVE  Blood pressure (!) 93/45, pulse 68, temperature (!) 95.4 F (35.2 C), temperature source Rectal, resp. rate 13, SpO2 100%.        Intake/Output Summary (Last 24 hours) at 10/16/2023 2159 Last data filed at 10/16/2023 2155 Gross per 24 hour  Intake 2000 ml  Output --  Net 2000 ml   There were no vitals filed for this visit.  Physical Examination  GENERAL: 78 year-old critically ill patient lying in the bed  EYES: PEERLA. No scleral icterus. Extraocular muscles intact.  HEENT: Head atraumatic, normocephalic. Oropharynx and nasopharynx clear.  CARDIOVASCULAR:S1, S2 normal. No murmurs, rubs, or gallops.  Intermittently tachycardic PULMONARY: Lungs course with wheezes ABDOMINAL: Soft, NTND MUSCULOSKELETAL: No swelling. Normal range of motion. 4/5 strength in all extremities.   NEUROLOGICAL: General: No focal deficit present. She is alert and oriented to person, place, and time baseline.  PSYCHIATRIC:  Mood and Affect: Mood normal.  SKIN:Skin is warm and dry. No obvious rash, lesion, or ulcer. Capillary Refill:< 2sec  Labs/imaging that I {ACTIONS; HAVE/HAVE NOT:19434}personally reviewed  (right click and Reselect all SmartList Selections daily)  CT Head Wo Contrast Result Date: 10/16/2023 CLINICAL DATA:  Mental status change, unknown cause EXAM: CT HEAD WITHOUT CONTRAST TECHNIQUE: Contiguous axial images were obtained from the base of the skull through the vertex without intravenous contrast. RADIATION DOSE REDUCTION: This exam was performed according to the departmental dose-optimization program which includes automated exposure control, adjustment of the mA  and/or kV according to patient size and/or use of iterative reconstruction technique. COMPARISON:  CT head  09/16/2020 FINDINGS: Brain: Patchy and confluent areas of decreased attenuation are noted throughout the deep and periventricular white matter of the cerebral hemispheres bilaterally, compatible with chronic microvascular ischemic disease. No evidence of large-territorial acute infarction. No parenchymal hemorrhage. No mass lesion. No extra-axial collection. No mass effect or midline shift. No hydrocephalus. Basilar cisterns are patent. Vascular: No hyperdense vessel. Atherosclerotic calcifications are present within the cavernous internal carotid arteries. Skull: No acute fracture or focal lesion. Sinuses/Orbits: Paranasal sinuses and mastoid air cells are clear. The orbits are unremarkable. Other: None. IMPRESSION: No acute intracranial abnormality. Electronically Signed   By: Morgane  Naveau M.D.   On: 10/16/2023 18:56   CT ABDOMEN PELVIS WO CONTRAST Result Date: 10/16/2023 CLINICAL DATA:  LLQ abdominal pain also with vaginal bleeding EXAM: CT ABDOMEN AND PELVIS WITHOUT CONTRAST TECHNIQUE: Multidetector CT imaging of the abdomen and pelvis was performed following the standard protocol without IV contrast. RADIATION DOSE REDUCTION: This exam was performed according to the departmental dose-optimization program which includes automated exposure control, adjustment of the mA and/or kV according to patient size and/or use of iterative reconstruction technique. COMPARISON:  None Available. FINDINGS: Lower chest: Bilateral lower lobe linear atelectasis versus scarring. Coronary artery calcification. Cardiac leads partially visualized. Hepatobiliary: No focal liver abnormality. Status post cholecystectomy. No biliary dilatation. Pancreas: Diffusely atrophic. No focal lesion. Otherwise normal pancreatic contour. No surrounding inflammatory changes. No main pancreatic ductal dilatation. Spleen: Normal in size without focal abnormality. Adrenals/Urinary Tract: No adrenal nodule bilaterally. Punctate left renal  calcification. No hydronephrosis. No definite contour-deforming renal mass. No ureterolithiasis or hydroureter. The urinary bladder is unremarkable. Stomach/Bowel: Stomach is within normal limits. No evidence of bowel wall thickening or dilatation. Slightly hypertrophic ileocecal valve. Appendix appears normal. Vascular/Lymphatic: No abdominal aorta or iliac aneurysm. Severe atherosclerotic plaque of the aorta and its branches. No abdominal, pelvic, or inguinal lymphadenopathy. Reproductive: Status post hysterectomy. No adnexal masses. Other: No intraperitoneal free fluid. No intraperitoneal free gas. No organized fluid collection. Musculoskeletal: No abdominal wall hernia or abnormality. No suspicious lytic or blastic osseous lesions. No acute displaced fracture. Multilevel degenerative changes of the spine. IMPRESSION: 1. No acute intra-abdominal or intrapelvic abnormality with limited evaluation on this noncontrast study. 2. Punctate nonobstructive left nephrolithiasis. 3. Status post cholecystectomy and hysterectomy. 4.  Aortic Atherosclerosis (ICD10-I70.0). Electronically Signed   By: Morgane  Naveau M.D.   On: 10/16/2023 18:54   DG Chest Portable 1 View Result Date: 10/16/2023 CLINICAL DATA:  Chest pain. EXAM: PORTABLE CHEST 1 VIEW COMPARISON:  February 09, 2021. FINDINGS: Stable cardiomediastinal silhouette. Left-sided pacemaker is unchanged. Both lungs are clear. The visualized skeletal structures are unremarkable. IMPRESSION: No active disease. Electronically Signed   By: Lynwood Landy Raddle M.D.   On: 10/16/2023 15:25    Labs   CBC: Recent Labs  Lab 10/16/23 1520  WBC 11.8*  HGB 13.9  HCT 44.3  MCV 100.0  PLT 301    Basic Metabolic Panel: Recent Labs  Lab 10/16/23 1520  NA 142  K 5.7*  CL 106  CO2 10*  GLUCOSE 28*  BUN 91*  CREATININE 8.32*  CALCIUM  8.9  MG 1.5*   GFR: CrCl cannot be calculated (Unknown ideal weight.). Recent Labs  Lab 10/16/23 1520  WBC 11.8*   Liver  Function Tests: Recent Labs  Lab 10/16/23 1520  AST 32  ALT 17  ALKPHOS 83  BILITOT 0.7  PROT 7.0  ALBUMIN  3.4*   No results for input(s): LIPASE, AMYLASE in the last 168 hours. No results for input(s): AMMONIA in the last 168 hours.  ABG    Component Value Date/Time   PHART 7.47 (H) 02/09/2021 1635   PCO2ART 41 02/09/2021 1635   PO2ART 306 (H) 02/09/2021 1635   HCO3 29.8 (H) 02/09/2021 1635   O2SAT 99.9 02/09/2021 1635    Coagulation Profile: Recent Labs  Lab 10/16/23 1520  INR 1.3*   Cardiac Enzymes: Recent Labs  Lab 10/16/23 1520  CKTOTAL 230   HbA1C: Hgb A1c MFr Bld  Date/Time Value Ref Range Status  01/17/2021 04:43 AM 11.5 (H) 4.8 - 5.6 % Final    Comment:    (NOTE)         Prediabetes: 5.7 - 6.4         Diabetes: >6.4         Glycemic control for adults with diabetes: <7.0   01/15/2021 06:06 AM 11.5 (H) 4.8 - 5.6 % Final    Comment:    (NOTE)         Prediabetes: 5.7 - 6.4         Diabetes: >6.4         Glycemic control for adults with diabetes: <7.0    CBG: Recent Labs  Lab 10/16/23 1707 10/16/23 1725 10/16/23 1809 10/16/23 1933 10/16/23 2145  GLUCAP <10* 102* 111* 99 87   Review of Systems:   Unable to be obtained secondary to the patient's altered mental status.   Past Medical History  She,  has a past medical history of Afib (HCC), Atrial fibrillation (HCC), Diabetes mellitus without complication (HCC), DM (diabetes mellitus) (HCC), Dysrhythmia, HTN (hypertension), Hypertension, and Stroke (HCC) (07/21/12).   Surgical History    Past Surgical History:  Procedure Laterality Date   ABDOMINAL HYSTERECTOMY     CARPAL TUNNEL RELEASE     COLONOSCOPY  01/02/2009   Dr Dessa diverticulosis.   COLONOSCOPY WITH PROPOFOL  N/A 06/28/2017   Procedure: COLONOSCOPY WITH PROPOFOL ;  Surgeon: Dessa Reyes ORN, MD;  Location: [facility] ENDOSCOPY;  Service: Endoscopy;  Laterality: N/A;   ELBOW SURGERY     INSERT / REPLACE / REMOVE PACEMAKER      Dr Ammon   JOINT REPLACEMENT  2005   knee   SHOULDER ARTHROSCOPY       Social History   reports that she has never smoked. She has never used smokeless tobacco. She reports that she does not drink alcohol and does not use drugs.   Family History   Her family history includes Breast cancer in her mother; Prostate cancer in her father.   Allergies Allergies  Allergen Reactions   Codeine Other (See Comments)    GI Distress GI Distress GI Distress   Codeine    Digoxin     Other reaction(s): Unknown   Lanoxin [Digoxin]    Penicillins    Zoloft [Sertraline Hcl]    Penicillins Rash and Other (See Comments)     Home Medications  Prior to Admission medications   Medication Sig Start Date End Date Taking? Authorizing Provider  acetaminophen  (TYLENOL ) 325 MG tablet Take 2 tablets (650 mg total) by mouth every 6 (six) hours as needed for mild pain, headache, fever or moderate pain. 09/21/20   Elgergawy, Brayton RAMAN, MD  albuterol  (PROVENTIL ) (2.5 MG/3ML) 0.083% nebulizer solution Inhale 3 mLs into the lungs every 4 (four) hours as needed for wheezing or shortness of breath. 09/21/20   Elgergawy, Brayton  S, MD  albuterol  (VENTOLIN  HFA) 108 (90 Base) MCG/ACT inhaler Inhale 2 puffs into the lungs every 6 (six) hours as needed for wheezing or shortness of breath. 08/12/20   [provider]  allopurinol  (ZYLOPRIM ) 100 MG tablet Take 100 mg by mouth daily. 11/28/20   [provider]  atorvastatin  (LIPITOR) 20 MG tablet Take 20 mg by mouth daily.    [provider]  colchicine  0.6 MG tablet Take 1 tablet (0.6 mg total) by mouth daily. 02/11/21   Jens Durand, MD  ELIQUIS  5 MG TABS tablet Take 5 mg by mouth 2 (two) times daily. 01/07/21   [provider]  famotidine  (PEPCID ) 20 MG tablet Take 20 mg by mouth 2 (two) times daily.    [provider]  furosemide  (LASIX ) 20 MG tablet Take 1 tablet (20 mg total) by mouth daily as needed (weight gain >3 lbs in  1 day and >5 lbs in 2 days). 10/20/20   Tobie Yetta HERO, MD  glipiZIDE  (GLUCOTROL  XL) 5 MG 24 hr tablet Take 5 mg by mouth daily. 01/04/21   [provider]  insulin  aspart (NOVOLOG ) 100 UNIT/ML injection Inject 0-9 Units into the skin 3 (three) times daily with meals. 09/21/20   Elgergawy, Brayton RAMAN, MD  levETIRAcetam  (KEPPRA ) 1000 MG tablet Take 1,000 mg by mouth 2 (two) times daily. 01/08/21   [provider]  lisinopril  (ZESTRIL ) 5 MG tablet Take 5 mg by mouth daily. 11/07/20   [provider]  metFORMIN  (GLUCOPHAGE ) 1000 MG tablet Take 1,000 mg by mouth 2 (two) times daily. 11/28/20   [provider]  polyethylene glycol (MIRALAX  / GLYCOLAX ) 17 g packet Take 17 g by mouth 2 (two) times daily. 10/20/20   Tobie Yetta HERO, MD  Scheduled Meds:  NOREEN ON 10/17/2023] Chlorhexidine  Gluconate Cloth  6 each Topical Daily   Continuous Infusions:  cefTRIAXone  (ROCEPHIN )  IV     dextrose  5 % and 0.45 % NaCl 125 mL/hr at 10/16/23 2154   norepinephrine  (LEVOPHED ) Adult infusion 2 mcg/min (10/16/23 2223)   PRN Meds:.  Active Hospital Problem list   See systems below  Assessment & Plan:  #Sepsis due to UTI Hx of E. Coli and Enterococcus faecalis UTI  Presenting with altered mental status, UA positive for UTI -F/u cultures, trend lactic/ PCT -Monitor WBC/ fever curve -Continue IV abx pending cultures -IVF hydration as needed -Pressors for MAP goal >65 -Strict I/O's    #AKI on CKD stage III: AKI likely iso dehydration and sepsis #AGMA #Hyperkalemia #Hypomagnesium  #Paroxysmal Afib  Dual-chamber pacemaker for complete heart block #HTN:  #HLD:  -Hold home lisinopril .  -Continue Eliquis  -Continued home statin -Replete K<4, Mg<2   #Hx of seizures -Continued home keppra , will switch to IV while npo -PRN Lorazepam  for breakthrough seizure -seizure precaution  #Anxiety and Depression -Continue home    #T2DM #Peripheral neuropathy: In s/o diabetes history.  Unclear if still taking lyrica  or Cymbalta   Presenting with hypoglycemia 23 -CBG's q2 -Target range of 140 to 180, avoid hypoglycemia -Hold SSI and Lantus  until stable -Follow ICU Hypo/Hyperglycemia protocol -Hold home Glipizide  and Metformin   #Gout -check uric acid -Hold Allopurinol  and colchicine  ISO AKI  #FTT #Falls Risk She uses a walker at baseline and will use a wheelchair which she goes to the doctor's office. Recent multiple falls with decreased po intake -PT/OT -TOC consult for SNF placement consideration -Dietary consult -Palliative consult   Best practice:  Diet:  {Ipzu:73067} Pain/Anxiety/Delirium protocol (if indicated): {  Pain/Anxiety/Delirium:26941} VAP protocol (if indicated): {VAP:29640} DVT prophylaxis: {DVT Prophylaxis:26933} GI prophylaxis: {GI:26934} Glucose control:  {Glucose Control:26935} Central venous access:  {Central Venous Access:26936} Arterial line:  {Central Venous Access:26936} Foley:  {Central Venous Access:26936} Mobility:  {Mobility:26937}  PT consulted: {PT Consult:26938} Last date of multidisciplinary goals of care discussion []  Code Status:  {Code Status:26939} Disposition: ***   = Goals of Care = Code Status Order: FULL  Primary Emergency Contact: Williams,Christy Wishes to pursue full aggressive treatment and intervention options, including CPR and intubation, but goals of care will be addressed on going with family if that should become necessary.  Critical care time: 45 minutes        Almarie Nose DNP, CCRN, FNP-C, AGACNP-BC Acute Care & Family Nurse Practitioner [facility] PCCM on call pager [phone]

## 2023-10-16 NOTE — ED Notes (Signed)
 First nurse note: From home [facility], diarrhea, confusion, and blood in stool, vomiting since 2 days. Also frequent falls and failure to thrive, pending palliative care consult. Husband recently died.  VSS.

## 2023-10-16 NOTE — Progress Notes (Signed)
 eLink Physician-Brief Progress Note Patient Name: Niamh Rada DOB: [birth year] MRN: [id number]   Date of Service  10/16/2023  HPI/Events of Note  78 year old female with a history of paroxysmal atrial fibrillation, complete heart block status post pacemaker, previous CVA and diabetes who presents with altered mental status, diarrhea, vomiting found to be in septic shock.  Vital signs are consistent with hypotension but otherwise saturating 92% on room air and on low-dose norepinephrine  infusion.  Results consistent with acute anion gap metabolic acidosis complicated by hyperkalemia, elevated creatinine/BUN, leukocytosis.  Imaging reviewed.  eICU Interventions  Infectious diarrhea workup pending, consider the addition of Flagyl  with ceftriaxone  for empiric C. difficile coverage.  Maintain IV fluids and norepinephrine  as needed to maintain MAP greater than 65.  Anticipate electrolyte repletion, hyperkalemia management with IVF  DVT chemoprophylaxis anticipated. GI prophylaxis not indicated     Intervention Category Evaluation Type: New Patient Evaluation  Wyndell Cardiff 10/16/2023, 10:48 PM

## 2023-10-16 NOTE — ED Provider Notes (Signed)
 [facility] Provider Note    Event Date/Time   First MD Initiated Contact with Patient 10/16/23 1355     (approximate)   History   Altered Mental Status (nausea)   HPI  Traci Carter is a 78 y.o. female past medical history significant for paroxysmal atrial fibrillation, pacemaker for complete heart block, hypertension, hyperlipidemia, prior CVA, diabetes, who presents to the emergency department with altered mental status.  History is provided by the patient's daughter at bedside.  States that her husband who she lives with is currently a admitted to the hospital under comfort care.  Since Wednesday she has had a decline and has not been feeling well.  States that she has not been eating and drinking as much as she normally would.  Uncertain if she is taking her medications as prescribed.  Over the past 2 days got significantly worse with nausea, vomiting and diarrhea.  Started having vaginal bleeding which she felt was significant and consistent with her menstrual cycle and this has not occurred in the past.  She does take Eliquis  for her paroxysmal atrial fibrillation.  Has not noted any melanotic stool.  Has been more confused and altered and does not know where she is which is new for her over the past 1 week.  No known falls or trauma.     Physical Exam   Triage Vital Signs: ED Triage Vitals [10/16/23 1217]  Encounter Vitals Group     BP (!) 109/50     Girls Systolic BP Percentile      Girls Diastolic BP Percentile      Boys Systolic BP Percentile      Boys Diastolic BP Percentile      Pulse Rate 68     Resp 18     Temp 97.6 F (36.4 C)     Temp Source Oral     SpO2 95 %     Weight      Height      Head Circumference      Peak Flow      Pain Score      Pain Loc      Pain Education      Exclude from Growth Chart     Most recent vital signs: Vitals:   10/16/23 1217 10/16/23 1515  BP: (!) 109/50 (!) 99/56  Pulse: 68 (!) 35  Resp: 18  18  Temp: 97.6 F (36.4 C)   SpO2: 95% 98%    Physical Exam Constitutional:      Appearance: She is well-developed.  HENT:     Head: Atraumatic.     Mouth/Throat:     Mouth: Mucous membranes are dry.  Eyes:     Conjunctiva/sclera: Conjunctivae normal.  Cardiovascular:     Rate and Rhythm: Regular rhythm.  Pulmonary:     Effort: No respiratory distress.  Abdominal:     General: There is no distension.     Tenderness: There is abdominal tenderness (Left lower quadrant abdominal tenderness to palpation with no rebound or guarding).  Genitourinary:    Comments: Rectal exam with no gross blood or melena Musculoskeletal:        General: Normal range of motion.     Cervical back: Normal range of motion.  Skin:    General: Skin is warm.     Capillary Refill: Capillary refill takes 2 to 3 seconds.  Neurological:     Mental Status: She is alert. Mental status is at baseline.  She is disoriented.  Psychiatric:        Mood and Affect: Mood normal.     IMPRESSION / MDM / ASSESSMENT AND PLAN / ED COURSE  I reviewed the triage vital signs and the nursing notes.  On arrival afebrile, soft blood pressures, no tachycardia  Differential diagnosis including dehydration, anemia, electrolyte abnormality, CVA, infectious process, intracranial hemorrhage, ACS, rhabdomyolysis, C. Difficile  EKG  I, Clotilda Punter, the attending physician, personally viewed and interpreted this ECG.  EKG with atrial fibrillation with underlying right bundle branch block no specific ST elevation or depression.  No findings of acute ischemia.  No change compared to prior  No tachycardic or bradycardic dysrhythmias while on cardiac telemetry.  RADIOLOGY I independently reviewed imaging, my interpretation of imaging: Chest x-ray with no signs of pneumothorax, no signs of pneumonia  LABS (all labs ordered are listed, but only abnormal results are displayed) Labs interpreted as -    Labs Reviewed  CBC -  Abnormal; Notable for the following components:      Result Value   WBC 11.8 (*)    All other components within normal limits  PROTIME-INR - Abnormal; Notable for the following components:   Prothrombin Time 17.3 (*)    INR 1.3 (*)    All other components within normal limits  C DIFFICILE QUICK SCREEN W PCR REFLEX    GASTROINTESTINAL PANEL BY PCR, STOOL (REPLACES STOOL CULTURE)  COMPREHENSIVE METABOLIC PANEL WITH GFR  URINALYSIS, W/ REFLEX TO CULTURE (INFECTION SUSPECTED)  MAGNESIUM   CK  TROPONIN I (HIGH SENSITIVITY)     MDM  Presents to the emergency department with altered mental status.  Plan for lab work and to further evaluate for ACS, infectious process will obtain CT scan of her head to further evaluate but has a nonfocal neurologic exam at this time.  No obvious findings concerning for an upper GI bleed and no melena on exam.  Does have new vaginal bleeding.  Patient will require admission for altered mental status     PROCEDURES:  Critical Care performed: No  Procedures  Patient's presentation is most consistent with acute presentation with potential threat to life or bodily function.   MEDICATIONS ORDERED IN ED: Medications  sodium chloride  0.9 % bolus 1,000 mL (has no administration in time range)  acetaminophen  (TYLENOL ) tablet 1,000 mg (1,000 mg Oral Not Given 10/16/23 1539)    FINAL CLINICAL IMPRESSION(S) / ED DIAGNOSES   Final diagnoses:  Altered mental status, unspecified altered mental status type     Rx / DC Orders   ED Discharge Orders     None        Note:  This document was prepared using Dragon voice recognition software and may include unintentional dictation errors.   Punter Clotilda, MD 10/16/23 [phone]

## 2023-10-16 NOTE — ED Notes (Signed)
 CCMD called to initiate monitoring @ this time

## 2023-10-16 NOTE — Consult Note (Signed)
 PHARMACY CONSULT NOTE - ELECTROLYTES  Pharmacy Consult for Electrolyte Monitoring and Replacement   Recent Labs:   CrCl cannot be calculated (Unknown ideal weight.). Potassium (mmol/L)  Date Value  10/16/2023 5.7 (H)  08/02/2012 3.6   Magnesium  (mg/dL)  Date Value  92/85/7974 1.5 (L)   Calcium  (mg/dL)  Date Value  92/85/7974 8.9   Calcium , Total (mg/dL)  Date Value  94/98/7985 9.3   Albumin  (g/dL)  Date Value  92/85/7974 3.4 (L)  07/21/2012 3.6   Phosphorus (mg/dL)  Date Value  88/90/7977 2.9   Sodium (mmol/L)  Date Value  10/16/2023 142  08/02/2012 138    Assessment  Traci Carter is a 78 y.o. female presenting with altered mental status. PMH significant for paroxysmal atrial fibrillation, pacemaker for complete heart block, hypertension, hyperlipidemia, prior CVA, diabetes. Pharmacy has been consulted to monitor and replace electrolytes while under PCCM care.  Diet: regular diet MIVF: D51/2NS @ 125 mL/hr Pertinent medications: N/A  Goal of Therapy: Electrolytes WNL  Plan:  Mag 1.5: Mag sulfate 2g IV x 1 K 5.7: being managed by CCM team currently Check BMP, Mg, Phos with AM labs  Thank you for allowing pharmacy to be a part of this patient's care.  Aniela Caniglia A Kamran Coker, PharmD Clinical Pharmacist 10/16/2023 9:19 PM

## 2023-10-17 ENCOUNTER — Inpatient Hospital Stay

## 2023-10-17 ENCOUNTER — Other Ambulatory Visit: Payer: Self-pay

## 2023-10-17 DIAGNOSIS — A419 Sepsis, unspecified organism: Secondary | ICD-10-CM

## 2023-10-17 DIAGNOSIS — R571 Hypovolemic shock: Secondary | ICD-10-CM

## 2023-10-17 DIAGNOSIS — N170 Acute kidney failure with tubular necrosis: Secondary | ICD-10-CM | POA: Insufficient documentation

## 2023-10-17 DIAGNOSIS — E872 Acidosis, unspecified: Secondary | ICD-10-CM

## 2023-10-17 LAB — RESPIRATORY PANEL BY PCR

## 2023-10-17 LAB — BLOOD GAS, ARTERIAL
Acid-base deficit: 22.9 mmol/L — ABNORMAL HIGH (ref 0.0–2.0)
Bicarbonate: 6.6 mmol/L — ABNORMAL LOW (ref 20.0–28.0)
FIO2: 21 %
O2 Saturation: 98.9 %
Patient temperature: 37
pCO2 arterial: 25 mmHg — ABNORMAL LOW (ref 32–48)
pH, Arterial: 7.03 — CL (ref 7.35–7.45)
pO2, Arterial: 98 mmHg (ref 83–108)

## 2023-10-17 LAB — HEMOGLOBIN A1C
Hgb A1c MFr Bld: 5.7 % — ABNORMAL HIGH (ref 4.8–5.6)
Mean Plasma Glucose: 117 mg/dL

## 2023-10-17 LAB — CBC
HCT: 38.9 % (ref 36.0–46.0)
Hemoglobin: 11.7 g/dL — ABNORMAL LOW (ref 12.0–15.0)
MCH: 30.9 pg (ref 26.0–34.0)
MCHC: 30.1 g/dL (ref 30.0–36.0)
MCV: 102.6 fL — ABNORMAL HIGH (ref 80.0–100.0)
Platelets: 261 K/uL (ref 150–400)
RBC: 3.79 MIL/uL — ABNORMAL LOW (ref 3.87–5.11)
RDW: 13.8 % (ref 11.5–15.5)
WBC: 13.8 K/uL — ABNORMAL HIGH (ref 4.0–10.5)
nRBC: 0.6 % — ABNORMAL HIGH (ref 0.0–0.2)

## 2023-10-17 LAB — HEPATIC FUNCTION PANEL
ALT: 15 U/L (ref 0–44)
AST: 25 U/L (ref 15–41)
Albumin: 2.9 g/dL — ABNORMAL LOW (ref 3.5–5.0)
Alkaline Phosphatase: 76 U/L (ref 38–126)
Bilirubin, Direct: <0.1 mg/dL (ref 0.0–0.2)
Total Bilirubin: 0.8 mg/dL (ref 0.0–1.2)
Total Protein: 5.9 g/dL — ABNORMAL LOW (ref 6.5–8.1)

## 2023-10-17 LAB — BASIC METABOLIC PANEL WITH GFR
Anion gap: 24 — ABNORMAL HIGH (ref 5–15)
Anion gap: 24 — ABNORMAL HIGH (ref 5–15)
BUN: 93 mg/dL — ABNORMAL HIGH (ref 8–23)
BUN: 96 mg/dL — ABNORMAL HIGH (ref 8–23)
CO2: 7 mmol/L — ABNORMAL LOW (ref 22–32)
CO2: 10 mmol/L — ABNORMAL LOW (ref 22–32)
Calcium: 7.9 mg/dL — ABNORMAL LOW (ref 8.9–10.3)
Calcium: 7.9 mg/dL — ABNORMAL LOW (ref 8.9–10.3)
Chloride: 105 mmol/L (ref 98–111)
Chloride: 103 mmol/L (ref 98–111)
Creatinine, Ser: 8.36 mg/dL — ABNORMAL HIGH (ref 0.44–1.00)
Creatinine, Ser: 8.16 mg/dL — ABNORMAL HIGH (ref 0.44–1.00)
GFR, Estimated: 5 mL/min — ABNORMAL LOW (ref >60)
GFR, Estimated: 5 mL/min — ABNORMAL LOW (ref >60)
Glucose, Bld: 181 mg/dL — ABNORMAL HIGH (ref 70–99)
Glucose, Bld: 211 mg/dL — ABNORMAL HIGH (ref 70–99)
Potassium: 6.2 mmol/L — ABNORMAL HIGH (ref 3.5–5.1)
Potassium: 4.9 mmol/L (ref 3.5–5.1)
Sodium: 136 mmol/L (ref 135–145)
Sodium: 137 mmol/L (ref 135–145)

## 2023-10-17 LAB — C-REACTIVE PROTEIN: CRP: 0.5 mg/dL (ref <1.0)

## 2023-10-17 LAB — RENAL FUNCTION PANEL
Albumin: 3.1 g/dL — ABNORMAL LOW (ref 3.5–5.0)
Anion gap: 25 — ABNORMAL HIGH (ref 5–15)
BUN: 69 mg/dL — ABNORMAL HIGH (ref 8–23)
CO2: 13 mmol/L — ABNORMAL LOW (ref 22–32)
Calcium: 7.6 mg/dL — ABNORMAL LOW (ref 8.9–10.3)
Chloride: 99 mmol/L (ref 98–111)
Creatinine, Ser: 5.74 mg/dL — ABNORMAL HIGH (ref 0.44–1.00)
GFR, Estimated: 7 mL/min — ABNORMAL LOW (ref >60)
Glucose, Bld: 104 mg/dL — ABNORMAL HIGH (ref 70–99)
Phosphorus: 5.4 mg/dL — ABNORMAL HIGH (ref 2.5–4.6)
Potassium: 3.6 mmol/L (ref 3.5–5.1)
Sodium: 137 mmol/L (ref 135–145)

## 2023-10-17 LAB — BLOOD GAS, VENOUS
Acid-base deficit: 26.5 mmol/L — ABNORMAL HIGH (ref 0.0–2.0)
Acid-base deficit: 16.5 mmol/L — ABNORMAL HIGH (ref 0.0–2.0)
Acid-base deficit: 12.1 mmol/L — ABNORMAL HIGH (ref 0.0–2.0)
Bicarbonate: 6.2 mmol/L — ABNORMAL LOW (ref 20.0–28.0)
Bicarbonate: 12 mmol/L — ABNORMAL LOW (ref 20.0–28.0)
Bicarbonate: 14.7 mmol/L — ABNORMAL LOW (ref 20.0–28.0)
O2 Saturation: 84.6 %
O2 Saturation: 85.3 %
O2 Saturation: 80.4 %
Patient temperature: 37
Patient temperature: 37
Patient temperature: 37
pCO2, Ven: 33 mmHg — ABNORMAL LOW (ref 44–60)
pCO2, Ven: 37 mmHg — ABNORMAL LOW (ref 44–60)
pCO2, Ven: 36 mmHg — ABNORMAL LOW (ref 44–60)
pH, Ven: <6.95 — CL (ref 7.25–7.43)
pH, Ven: 7.12 — CL (ref 7.25–7.43)
pH, Ven: 7.22 — ABNORMAL LOW (ref 7.25–7.43)
pO2, Ven: 55 mmHg — ABNORMAL HIGH (ref 32–45)
pO2, Ven: 53 mmHg — ABNORMAL HIGH (ref 32–45)
pO2, Ven: 46 mmHg — ABNORMAL HIGH (ref 32–45)

## 2023-10-17 LAB — RESP PANEL BY RT-PCR (RSV, FLU A&B, COVID)  RVPGX2
Influenza A by PCR: NEGATIVE
Influenza B by PCR: NEGATIVE
Resp Syncytial Virus by PCR: NEGATIVE
SARS Coronavirus 2 by RT PCR: NEGATIVE

## 2023-10-17 LAB — GLUCOSE, CAPILLARY
Glucose-Capillary: 140 mg/dL — ABNORMAL HIGH (ref 70–99)
Glucose-Capillary: 155 mg/dL — ABNORMAL HIGH (ref 70–99)
Glucose-Capillary: 161 mg/dL — ABNORMAL HIGH (ref 70–99)
Glucose-Capillary: 171 mg/dL — ABNORMAL HIGH (ref 70–99)
Glucose-Capillary: 180 mg/dL — ABNORMAL HIGH (ref 70–99)
Glucose-Capillary: 241 mg/dL — ABNORMAL HIGH (ref 70–99)
Glucose-Capillary: 85 mg/dL (ref 70–99)
Glucose-Capillary: 97 mg/dL (ref 70–99)

## 2023-10-17 LAB — HEPATITIS B SURFACE ANTIGEN: Hepatitis B Surface Ag: NONREACTIVE

## 2023-10-17 LAB — TROPONIN I (HIGH SENSITIVITY)
Troponin I (High Sensitivity): 60 ng/L — ABNORMAL HIGH (ref <18)
Troponin I (High Sensitivity): 106 ng/L (ref <18)
Troponin I (High Sensitivity): 123 ng/L (ref <18)

## 2023-10-17 LAB — MAGNESIUM: Magnesium: 2 mg/dL (ref 1.7–2.4)

## 2023-10-17 LAB — LACTIC ACID, PLASMA
Lactic Acid, Venous: 8.4 mmol/L (ref 0.5–1.9)
Lactic Acid, Venous: 8.8 mmol/L (ref 0.5–1.9)
Lactic Acid, Venous: 5.2 mmol/L (ref 0.5–1.9)

## 2023-10-17 LAB — MRSA NEXT GEN BY PCR, NASAL: MRSA by PCR Next Gen: NOT DETECTED

## 2023-10-17 LAB — PHOSPHORUS: Phosphorus: 9.5 mg/dL — ABNORMAL HIGH (ref 2.5–4.6)

## 2023-10-17 LAB — APTT: aPTT: >200 s (ref 24–36)

## 2023-10-17 MED ORDER — SODIUM BICARBONATE 8.4 % IV SOLN: 150.0000 meq | Freq: Once | INTRAVENOUS | Status: AC

## 2023-10-17 MED ORDER — INSULIN ASPART 100 UNIT/ML IJ SOLN
0.0000 [IU] | INTRAMUSCULAR | Status: DC
  Administered 2023-10-17: 2 [IU] via SUBCUTANEOUS
  Administered 2023-10-17: 3 [IU] via SUBCUTANEOUS
  Administered 2023-10-18 (×4): 1 [IU] via SUBCUTANEOUS
  Administered 2023-10-18: 2 [IU] via SUBCUTANEOUS
  Administered 2023-10-18: 1 [IU] via SUBCUTANEOUS
  Administered 2023-10-19 (×2): 3 [IU] via SUBCUTANEOUS
  Administered 2023-10-19 (×2): 2 [IU] via SUBCUTANEOUS
  Administered 2023-10-20 (×2): 1 [IU] via SUBCUTANEOUS
  Administered 2023-10-20 (×2): 2 [IU] via SUBCUTANEOUS
  Administered 2023-10-21: 1 [IU] via SUBCUTANEOUS
  Filled 2023-10-17: qty 1
  Filled 2023-10-17: qty 3
  Filled 2023-10-17 (×2): qty 2
  Filled 2023-10-17: qty 3
  Filled 2023-10-17: qty 2
  Filled 2023-10-17: qty 1
  Filled 2023-10-17: qty 2
  Filled 2023-10-17 (×4): qty 1
  Filled 2023-10-17: qty 3
  Filled 2023-10-17: qty 1
  Filled 2023-10-17: qty 2

## 2023-10-17 MED ORDER — VANCOMYCIN HCL 2000 MG/400ML IV SOLN
2000.0000 mg | Freq: Once | INTRAVENOUS | Status: AC
  Administered 2023-10-17: 2000 mg via INTRAVENOUS
  Filled 2023-10-17: qty 400

## 2023-10-17 MED ORDER — HEPARIN (PORCINE) 25000 UT/250ML-% IV SOLN
650.0000 [IU]/h | INTRAVENOUS | Status: DC
  Administered 2023-10-17: 950 [IU]/h via INTRAVENOUS
  Administered 2023-10-18: 650 [IU]/h via INTRAVENOUS
  Filled 2023-10-17: qty 250

## 2023-10-17 MED ORDER — INSULIN ASPART 100 UNIT/ML IV SOLN
10.0000 [IU] | Freq: Once | INTRAVENOUS | Status: AC
  Administered 2023-10-17: 10 [IU] via INTRAVENOUS
  Filled 2023-10-17: qty 0.1

## 2023-10-17 MED ORDER — DEXTROSE 50 % IV SOLN
1.0000 | Freq: Once | INTRAVENOUS | Status: AC
  Administered 2023-10-17: 50 mL via INTRAVENOUS
  Filled 2023-10-17: qty 50

## 2023-10-17 MED ORDER — VASOPRESSIN 20 UNITS/100 ML INFUSION FOR SHOCK
0.0000 [IU]/min | INTRAVENOUS | Status: DC
  Administered 2023-10-17 (×2): 0.03 [IU]/min via INTRAVENOUS
  Administered 2023-10-18 (×2): 0.04 [IU]/min via INTRAVENOUS
  Administered 2023-10-18: 0.02 [IU]/min via INTRAVENOUS
  Filled 2023-10-17 (×6): qty 100

## 2023-10-17 MED ORDER — THIAMINE HCL 100 MG/ML IJ SOLN
100.0000 mg | Freq: Every day | INTRAMUSCULAR | Status: DC
  Administered 2023-10-17 – 2023-10-19 (×3): 100 mg via INTRAVENOUS
  Filled 2023-10-17 (×3): qty 2

## 2023-10-17 MED ORDER — ATORVASTATIN CALCIUM 20 MG PO TABS
20.0000 mg | ORAL_TABLET | Freq: Every day | ORAL | Status: DC
  Administered 2023-10-17 – 2023-10-30 (×14): 20 mg via ORAL
  Filled 2023-10-17 (×14): qty 1

## 2023-10-17 MED ORDER — FAMOTIDINE IN NACL 20-0.9 MG/50ML-% IV SOLN
20.0000 mg | INTRAVENOUS | Status: DC
  Administered 2023-10-17 – 2023-10-18 (×2): 20 mg via INTRAVENOUS
  Filled 2023-10-17 (×2): qty 50

## 2023-10-17 MED ORDER — SODIUM BICARBONATE 8.4 % IV SOLN
50.0000 meq | INTRAVENOUS | Status: AC
  Administered 2023-10-17: 50 meq via INTRAVENOUS
  Filled 2023-10-17: qty 100

## 2023-10-17 MED ORDER — NOREPINEPHRINE 16 MG/250ML-% IV SOLN
0.0000 ug/min | INTRAVENOUS | Status: DC
  Administered 2023-10-17: 25 ug/min via INTRAVENOUS
  Administered 2023-10-17: 50 ug/min via INTRAVENOUS
  Filled 2023-10-17 (×2): qty 250

## 2023-10-17 MED ORDER — PRISMASOL BGK 4/2.5 32-4-2.5 MEQ/L EC SOLN
Status: DC
  Administered 2023-10-18 (×2): 1500 mL/h via INTRAVENOUS_CENTRAL

## 2023-10-17 MED ORDER — HEPARIN (PORCINE) 25000 UT/250ML-% IV SOLN
1200.0000 [IU]/h | INTRAVENOUS | Status: DC
  Administered 2023-10-17: 1200 [IU]/h via INTRAVENOUS
  Filled 2023-10-17: qty 250

## 2023-10-17 MED ORDER — LEVETIRACETAM (KEPPRA) 500 MG/5 ML ADULT IV PUSH
1000.0000 mg | Freq: Two times a day (BID) | INTRAVENOUS | Status: DC
  Administered 2023-10-17 – 2023-10-21 (×9): 1000 mg via INTRAVENOUS
  Filled 2023-10-17 (×10): qty 10

## 2023-10-17 MED ORDER — LEVETIRACETAM (KEPPRA) 500 MG/5 ML ADULT IV PUSH
1000.0000 mg | INTRAVENOUS | Status: DC
  Administered 2023-10-17: 1000 mg via INTRAVENOUS
  Filled 2023-10-17: qty 10

## 2023-10-17 MED ORDER — PRISMASOL BGK 2/3.5 32-2-3.5 MEQ/L EC SOLN: Status: DC

## 2023-10-17 MED ORDER — PRISMASOL BGK 0/2.5 32-2.5 MEQ/L EC SOLN: Status: DC

## 2023-10-17 MED ORDER — LEVETIRACETAM 500 MG PO TABS
1000.0000 mg | ORAL_TABLET | Freq: Two times a day (BID) | ORAL | Status: DC
  Administered 2023-10-17: 1000 mg via ORAL
  Filled 2023-10-17: qty 2

## 2023-10-17 MED ORDER — SODIUM BICARBONATE 8.4 % IV SOLN
INTRAVENOUS | Status: AC
  Administered 2023-10-17: 150 meq via INTRAVENOUS
  Filled 2023-10-17: qty 100

## 2023-10-17 MED ORDER — FAMOTIDINE 20 MG PO TABS: 20.0000 mg | ORAL_TABLET | Freq: Two times a day (BID) | ORAL | Status: DC

## 2023-10-17 MED ORDER — STERILE WATER FOR INJECTION IV SOLN
INTRAVENOUS | Status: DC
  Filled 2023-10-17 (×3): qty 1000
  Filled 2023-10-17: qty 150

## 2023-10-17 MED ORDER — PRISMASOL BGK 0/2.5 32-2.5 MEQ/L EC SOLN
Status: DC
  Administered 2023-10-17: 1500 mL/h via INTRAVENOUS_CENTRAL

## 2023-10-17 MED ORDER — PRISMASOL BGK 4/2.5 32-4-2.5 MEQ/L EC SOLN
Status: DC
  Administered 2023-10-18: 400 mL/h via INTRAVENOUS_CENTRAL

## 2023-10-17 MED ORDER — NOREPINEPHRINE 16 MG/250ML-% IV SOLN
0.0000 ug/min | INTRAVENOUS | Status: DC
  Administered 2023-10-17: 50 ug/min via INTRAVENOUS
  Administered 2023-10-17: 20 ug/min via INTRAVENOUS
  Filled 2023-10-17 (×3): qty 250

## 2023-10-17 MED ORDER — HEPARIN SODIUM (PORCINE) 1000 UNIT/ML DIALYSIS: 1000.0000 [IU] | INTRAMUSCULAR | Status: DC | PRN

## 2023-10-17 MED ORDER — SODIUM ZIRCONIUM CYCLOSILICATE 5 G PO PACK
10.0000 g | PACK | Freq: Two times a day (BID) | ORAL | Status: DC
  Administered 2023-10-17: 10 g via ORAL
  Filled 2023-10-17: qty 2

## 2023-10-17 MED ORDER — APIXABAN 5 MG PO TABS
5.0000 mg | ORAL_TABLET | Freq: Two times a day (BID) | ORAL | Status: DC
  Administered 2023-10-17: 5 mg via ORAL
  Filled 2023-10-17: qty 1

## 2023-10-17 MED ORDER — VANCOMYCIN HCL 1500 MG/300ML IV SOLN
1500.0000 mg | INTRAVENOUS | Status: DC
  Filled 2023-10-17: qty 300

## 2023-10-17 NOTE — Procedures (Signed)
 Central Venous Catheter Insertion Procedure Note  Traci Carter  [id number]  09/30/1945  Date:10/17/23  Time:8:29 AM   Provider Performing:Marlisa Caridi Traci Carter   Procedure: Insertion of Non-tunneled Central Venous Catheter(36556) with US  guidance (23062)   Indication(s) Medication administration  Consent Risks of the procedure as well as the alternatives and risks of each were explained to the patient and/or caregiver.  Consent for the procedure was obtained and is signed in the bedside chart  Anesthesia Topical only with 1% lidocaine   Timeout Verified patient identification, verified procedure, site/side was marked, verified correct patient position, special equipment/implants available, medications/allergies/relevant history reviewed, required imaging and test results available.  Sterile Technique Maximal sterile technique including full sterile barrier drape, hand hygiene, sterile gown, sterile gloves, mask, hair covering, sterile ultrasound probe cover (if used).  Procedure Description Area of catheter insertion was cleaned with chlorhexidine  and draped in sterile fashion.  With real-time ultrasound guidance a central venous catheter was placed into the left internal jugular vein. Nonpulsatile blood flow and easy flushing noted in all ports.  The catheter was sutured in place and sterile dressing applied.  Complications/Tolerance None; patient tolerated the procedure well. Chest X-ray is ordered to verify placement for internal jugular or subclavian cannulation.   Chest x-ray is not ordered for femoral cannulation.  EBL Minimal  Specimen(s) None  Traci Carter, AGNP  Pulmonary/Critical Care Pager [phone] (please enter 7 digits) PCCM Consult Pager [phone] (please enter 7 digits)

## 2023-10-17 NOTE — Plan of Care (Signed)
 Pt A&O x2 (self and city). Placed back on 2L [place]. Remains on CRRT with Levophed , Vaso, Heparin , and sodium bicarb drips in place. Vaso titrated up per orders and Levo titrating down as tolerated per orders. Pt on 4k/2.5 for CRRT, tolerated well. Pt's daughter updated via phone.   Problem: Clinical Measurements: Goal: Ability to maintain clinical measurements within normal limits will improve Outcome: Progressing Goal: Diagnostic test results will improve Outcome: Progressing Goal: Respiratory complications will improve Outcome: Progressing Goal: Cardiovascular complication will be avoided Outcome: Progressing   Problem: Activity: Goal: Risk for activity intolerance will decrease Outcome: Progressing   Problem: Nutrition: Goal: Adequate nutrition will be maintained Outcome: Not Progressing   Problem: Coping: Goal: Level of anxiety will decrease Outcome: Progressing   Problem: Elimination: Goal: Will not experience complications related to bowel motility Outcome: Progressing Goal: Will not experience complications related to urinary retention Outcome: Not Progressing   Problem: Pain Managment: Goal: General experience of comfort will improve and/or be controlled Outcome: Progressing   Problem: Safety: Goal: Ability to remain free from injury will improve Outcome: Progressing   Problem: Metabolic: Goal: Ability to maintain appropriate glucose levels will improve Outcome: Progressing   Problem: Nutritional: Goal: Maintenance of adequate nutrition will improve Outcome: Not Progressing   Problem: Tissue Perfusion: Goal: Adequacy of tissue perfusion will improve Outcome: Not Progressing

## 2023-10-17 NOTE — Care Management Important Message (Signed)
 Important Message  Patient Details  Name: Traci Carter MRN: [id number] Date of Birth: 04/19/1945   Important Message Given:  Yes - Medicare IM     Rojelio SHAUNNA Rattler 10/17/2023, 2:14 PM

## 2023-10-17 NOTE — TOC Initial Note (Signed)
 Transition of Care [facility]) - Initial/Assessment Note    Patient Details  Name: Traci Carter MRN: [id number] Date of Birth: 24-Feb-1946  [facility] of [facility]) CM/SW Contact:    Lauraine JAYSON Carpen, LCSW Phone Number: 10/17/2023, 4:00 PM  Clinical Narrative:    Patient only oriented to self. Per chart review, PCP is Honor Cork, MD. Pharmacy is [facility] in [place]. No home health prior to admission. CSW will follow patient's progress and facilitate discharge once medically stable.             Expected Discharge Plan:  (TBD) Barriers to Discharge: Continued Medical Work up   Patient Goals and CMS Choice            Expected Discharge Plan and Services     Post Acute Care Choice: NA Living arrangements for the past 2 months: Single Family Home                                      Prior Living Arrangements/Services Living arrangements for the past 2 months: Single Family Home   Patient language and need for interpreter reviewed:: Yes        Need for Family Participation in Patient Care: Yes (Comment)     Criminal Activity/Legal Involvement Pertinent to Current Situation/Hospitalization: No - Comment as needed  Activities of Daily Living      Permission Sought/Granted                  Emotional Assessment       Orientation: : Oriented to Self Alcohol / Substance Use: Not Applicable Psych Involvement: No (comment)  Admission diagnosis:  Altered mental status [R41.82] Altered mental status, unspecified altered mental status type [R41.82] Patient Active Problem List   Diagnosis Date Noted   Acute renal failure with tubular necrosis (HCC) 10/17/2023   Hypovolemic shock (HCC) 10/17/2023   Severe sepsis with septic shock (HCC) 10/17/2023   Metabolic acidosis 10/17/2023   Altered mental status 10/16/2023   Ambien  accidental overdose, initial encounter 02/09/2021   Hypokalemia 02/09/2021   Hypocalcemia 02/09/2021   Acquired thrombophilia (HCC)  01/16/2021   UTI (urinary tract infection) 01/14/2021   Acute metabolic encephalopathy 01/14/2021   Gout flare 01/14/2021   Hyperglycemia due to type 2 diabetes mellitus (HCC) 01/14/2021   Atrial fibrillation, chronic (HCC) 01/14/2021   HTN (hypertension) 01/14/2021   History of CVA (cerebrovascular accident) 01/14/2021   Fracture of femoral condyle, right, closed (HCC) 10/17/2020   Generalized weakness 09/16/2020   Type II diabetes mellitus with renal manifestations (HCC) 09/16/2020   CKD (chronic kidney disease), stage IIIa 09/16/2020   Depression with anxiety 09/16/2020   Fall 09/16/2020   UTI (urinary tract infection) 09/16/2020   Seizure (HCC) 09/16/2020   Diabetes mellitus type 2, uncomplicated (HCC) 09/10/2019   Chest pain with low risk for cardiac etiology 12/27/2018   SOB (shortness of breath) on exertion 12/27/2018   Paroxysmal atrial fibrillation (HCC) 10/16/2018   Hyperlipidemia associated with type 2 diabetes mellitus (HCC) 10/16/2018   Pacemaker 10/16/2018   History of CVA (cerebrovascular accident) 10/16/2018   OSA (obstructive sleep apnea) 10/16/2018   Fibromyalgia 10/16/2018   CAD (coronary artery disease) 10/16/2018   Diarrhea 06/09/2017   Hyperlipemia 08/15/2013   ICH (intracerebral hemorrhage) (HCC) 07/21/2012   Diabetes (HCC) 07/21/2012   Essential hypertension 07/21/2012   CVA (cerebral vascular accident) (HCC) 07/20/2012   PCP:  Cork Honor  JAYSON, MD Pharmacy:   [facility] [phone] [address], [place] - [address] AT [facility] [address] [place] [address] Phone: [phone] Fax: [phone]     Social Drivers of Health (SDOH) Social History: SDOH Screenings   Food Insecurity: No Food Insecurity (09/25/2020)   Received from [facility]  Transportation Needs: Unmet Transportation Needs (06/28/2023)   Received from [facility]  Financial Resource Strain: Low Risk  (09/25/2020)   Received from  [facility]  Tobacco Use: Low Risk  (10/16/2023)   SDOH Interventions:     Readmission Risk Interventions    10/17/2023    3:58 PM  Readmission Risk Prevention Plan  PCP or Specialist Appt within 3-5 Days Complete  Social Work Consult for Recovery Care Planning/Counseling Complete  Palliative Care Screening Not Applicable

## 2023-10-17 NOTE — Progress Notes (Signed)
 PHARMACY - ANTICOAGULATION CONSULT NOTE  Pharmacy Consult for heparin  infusion Indication: atrial fibrillation  Allergies  Allergen Reactions   Codeine Other (See Comments)    GI Distress GI Distress GI Distress   Codeine    Digoxin     Other reaction(s): Unknown   Lanoxin [Digoxin]    Penicillins    Zoloft [Sertraline Hcl]    Penicillins Rash and Other (See Comments)    Patient Measurements: Height: 5' 3 (160 cm) Weight: 103.1 kg (227 lb 4.7 oz) IBW/kg (Calculated) : 52.4 HEPARIN  DW (KG): 76.8  Vital Signs: Temp: 98 F (36.7 C) (07/15 1930) Temp Source: Axillary (07/15 1930) BP: 95/55 (07/15 2215) Pulse Rate: 71 (07/15 2215)  Labs: Recent Labs    10/16/23 1520 10/17/23 0441 10/17/23 1003 10/17/23 1105 10/17/23 1703 10/17/23 1934 10/17/23 2105  HGB 13.9 11.7*  --   --   --   --   --   HCT 44.3 38.9  --   --   --   --   --   PLT 301 261  --   --   --   --   --   APTT  --   --   --   --   --   --  >200*  LABPROT 17.3*  --   --   --   --   --   --   INR 1.3*  --   --   --   --   --   --   CREATININE 8.32* 8.36* 8.16*  --  5.74*  --   --   CKTOTAL 230  --   --   --   --   --   --   TROPONINIHS 53*  --   --  60* 106* 123*  --     Estimated Creatinine Clearance: 9.6 mL/min (A) (by C-G formula based on SCr of 5.74 mg/dL (H)).   Medical History: Past Medical History:  Diagnosis Date   Afib (HCC)    Atrial fibrillation (HCC)    Diabetes mellitus without complication (HCC)    DM (diabetes mellitus) (HCC)    Dysrhythmia    HTN (hypertension)    Hypertension    Stroke (HCC) 07/21/12   posterior superior left frontal lobe parenchymal hemorrhage     Medications:  Scheduled:   atorvastatin   20 mg Oral Daily   Chlorhexidine  Gluconate Cloth  6 each Topical Daily   insulin  aspart  0-9 Units Subcutaneous Q4H   levETIRAcetam   1,000 mg Intravenous Q12H   thiamine  (VITAMIN B1) injection  100 mg Intravenous QHS   Infusions:   cefTRIAXone  (ROCEPHIN )  IV  Stopped (10/17/23 0033)   famotidine  (PEPCID ) IV Stopped (10/17/23 1022)   heparin      metronidazole  Stopped (10/17/23 1900)   norepinephrine  (LEVOPHED ) Adult infusion 25 mcg/min (10/17/23 2200)   prismasol  BGK 4/2.5 400 mL/hr at 10/17/23 1944   prismasol  BGK 4/2.5 400 mL/hr at 10/17/23 1943   prismasol  BGK 4/2.5 1,500 mL/hr at 10/17/23 2220   sodium bicarbonate  150 mEq in sterile water  1,150 mL infusion 100 mL/hr at 10/17/23 2200   [START ON 10/18/2023] vancomycin      vasopressin  0.04 Units/min (10/17/23 2200)    Assessment: 78 y.o. female presenting with altered mental status. PMH significant for paroxysmal atrial fibrillation, pacemaker for complete heart block, hypertension, hyperlipidemia, prior CVA, diabetes. She takes apixaban  PTA with last dose 07/15 0155  Baseline Labs: Hgb 11.7, PLT wnl, INR 1.3  Goal of  Therapy:  anti-Xa level 0.3-0.7 units/ml aPTT 66 - 102 seconds Monitor platelets by anticoagulation protocol: Yes   Plan:  7/15: aPTT @ 2105 = > 200, elevated - RN confirmed lab sample was drawn from different line as heparin  infusion - will hold heparin  gtt for 1 hr and restart @ 950 units/hr at 2330 - recheck aPTT and HL 8 hrs after restart  -- we will use aPTT to guide therapy until aPTT and anti-Xa level correlate ---Continue to monitor H&H and platelets  Neeta Storey D 10/17/2023,10:29 PM

## 2023-10-17 NOTE — Progress Notes (Signed)
 Initial Nutrition Assessment  DOCUMENTATION CODES:   Obesity unspecified  INTERVENTION:   Recommend consideration of NGT placement and nutrition support if goal is for full aggressive care  RD will add supplements with diet advancement   Rena-vit daily with diet advancement   Thiamine  100mg  IV daily   Pt at high refeed risk  Daily weights   NUTRITION DIAGNOSIS:   Inadequate oral intake related to acute illness as evidenced by per patient/family report.  GOAL:   Patient will meet greater than or equal to 90% of their needs  MONITOR:   PO intake, Supplement acceptance, Diet advancement, Labs, Weight trends, I & O's, Skin  REASON FOR ASSESSMENT:   Consult Assessment of nutrition requirement/status  ASSESSMENT:   78 y.o female with h/o Afib on Eliquis , dual-chamber pacemaker for complete heart block, DM2, CKD III, HLD, CAD, HTN, ICH (2012), CVA, GERD, seizures, fibromyalgia, depression/anxiety, gout and OSA not on CPAP who is admitted with altered mental status, UTI, AKI and diarrhea.  RD unable to see patient today as pt having a line placed at time of RD visit. Per chart review, family reports patient with poor appetite and oral intake for several days pta r/t diarrhea, AMS and vomiting. Apparently pt's husband recently passed away or was placed on comfort care and pt has been failing to thrive. Pt with poor oral intake in hospital and has now been made NPO. Recommend NGT placement and nutrition support if plan is for full aggressive care. RD will add supplements with diet advancement. Pt is at high refeed risk. Pt with elevated lactic acid; will add IV thiamine . CRRT initiating today. There is no recent documented weight history in chart to determine if any significant changes. RD will obtain history and exam at follow up.  Medications reviewed and include: insulin , lokelma , ceftriaxone , pepcid , heparin , metronidazole , levophed , Na bicarbonate, vancomycin , vasopressin     Labs reviewed: K 4.9 wnl, BUN 96(H), creat 8.16(H), P 9.5(H), Mg 2.0 wnl Lactic acid- 8.8(H) Wbc- 13.8(H) Cbgs- 97, 155, 241, 180, 171, 161 x 24 hrs  NUTRITION - FOCUSED PHYSICAL EXAM: RD unable to perform at this time   Diet Order:   Diet Order     None      EDUCATION NEEDS:   No education needs have been identified at this time  Skin:  Skin Assessment: Reviewed RN Assessment  Last BM:  pta  Height:   Ht Readings from Last 1 Encounters:  10/16/23 5' 3 (1.6 m)    Weight:   Wt Readings from Last 1 Encounters:  10/16/23 103.1 kg    Ideal Body Weight:  52.2 kg  BMI:  Body mass index is 40.26 kg/m.  Estimated Nutritional Needs:   Kcal:  2000-2300kcal/day  Protein:  100-115g/day  Fluid:  1.6-1.8L/day  Augustin Shams MS, RD, LDN If unable to be reached, please send secure chat to RD inpatient available from 8:00a-4:00p daily

## 2023-10-17 NOTE — Consult Note (Signed)
 PHARMACY CONSULT NOTE - ELECTROLYTES  Pharmacy Consult for Electrolyte Monitoring and Replacement   Recent Labs: Height: 5' 3 (160 cm) Weight: 103.1 kg (227 lb 4.7 oz) IBW/kg (Calculated) : 52.4 Estimated Creatinine Clearance: 6.6 mL/min (A) (by C-G formula based on SCr of 8.36 mg/dL (H)). Potassium (mmol/L)  Date Value  10/17/2023 6.2 (H)  08/02/2012 3.6   Magnesium  (mg/dL)  Date Value  92/84/7974 2.0   Calcium  (mg/dL)  Date Value  92/84/7974 7.9 (L)   Calcium , Total (mg/dL)  Date Value  94/98/7985 9.3   Albumin  (g/dL)  Date Value  92/85/7974 3.4 (L)  07/21/2012 3.6   Phosphorus (mg/dL)  Date Value  92/84/7974 9.5 (H)   Sodium (mmol/L)  Date Value  10/17/2023 136  08/02/2012 138    Assessment  Traci Carter is a 78 y.o. female presenting with altered mental status. PMH significant for paroxysmal atrial fibrillation, pacemaker for complete heart block, hypertension, hyperlipidemia, prior CVA, diabetes. Pharmacy has been consulted to monitor and replace electrolytes while under PCCM care. CRRT started 07/15  MIVF: sodium bicarbonate  150 mEq in sterile water  @ 100 mL/hr Pertinent medications: sodium zirconium cyclosilicate  10 grams po BID  Goal of Therapy: Electrolytes WNL  Plan:  No electrolyte replacement warranted at this time Renal function panel BID while on CRRT   Thank you for allowing pharmacy to be a part of this patient's care.  [name], PharmD Clinical Pharmacist 10/17/2023 7:26 AM

## 2023-10-17 NOTE — Progress Notes (Signed)
 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 Coli and Enterococcus faecalis UTI who presented to the ED with chief complaints of altered mental status.   Per the ED report, the patient's husband is currently admitted to the hospital and was transitioned to comfort care on 10/15/23. The patient's daughter states the patient has continued to decline and has not been eating, drinking, or taking her medications. Her condition worsened over the past two days, with nausea, vomiting, and diarrhea. The patient was noted with worsening confusion and generalized weakness today, which prompted further evaluation in the ED.   ED Course: Initial vital signs showed HR of 68 beats/minute, BP 109/50 mm Hg, the RR 18 breaths/minute, and the oxygen saturation 95 % on RA and a temperature of 97.50F (36.4C). 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 Coli and Enterococcus faecalis UTI  Significant Hospital Events   7/14: Admit to ICU with altered mental status found to be septic due to UTI and probable hypovolemia iso dehydration 10/17/23- patient continued to have worsening renal failure and mental status. Reviewed with  nephrology, started on CRRT today.  VBG with severe acidosis improved post treatment.  Remains severely critically ill.   Consults:  None  Procedures:  None  Interim History / Subjective:      Micro Data:  7/14: Blood culture x2> 7/14: Urine Culture> 7/14: MRSA PCR>>   Antimicrobials:  Ceftriaxone  7/14 Metronidazole  7/14  OBJECTIVE  Blood pressure (!) 81/42, pulse (!) 112, temperature (!) 97.4 F (36.3 C), resp. rate 13, height 5' 3 (1.6 m), weight 103.1 kg, SpO2 96%.        Intake/Output Summary (Last 24 hours) at 10/17/2023 1019 Last data filed at 10/17/2023 1009 Gross per 24 hour  Intake 3451.33 ml  Output --  Net 3451.33 ml   Filed Weights   10/16/23 2241  Weight: 103.1 kg    Physical Examination  GENERAL: 78 year-old critically ill patient lying in the bed  EYES: PEERLA. No scleral icterus. Extraocular muscles intact.  HEENT: Head atraumatic, normocephalic. Oropharynx and nasopharynx clear.  CARDIOVASCULAR:S1, S2 normal. No murmurs, rubs, or gallops.   PULMONARY: Lungs course with wheezes ABDOMINAL: Soft, NTND MUSCULOSKELETAL: No swelling. Normal range of motion. 4/5 strength in all extremities.   NEUROLOGICAL: General: No focal deficit present. She is alert and oriented to person only.  PSYCHIATRIC:  Mood and Affect: Mood normal.  SKIN:Skin is warm and dry. No obvious rash, lesion, or ulcer. Capillary Refill:< 2sec  Labs/imaging that I havepersonally reviewed  (right click and Reselect all SmartList Selections daily)  DG Chest Port 1 View Result Date: 10/17/2023 CLINICAL DATA:  Central line placement EXAM: PORTABLE CHEST 1 VIEW COMPARISON:  Yesterday FINDINGS: Placement of a left IJ line with tip at low SVC. Patient rotated left.  Midline trachea. Mild cardiomegaly. Atherosclerosis in the transverse aorta. No pleural effusion or pneumothorax. No congestive failure. Clear lungs. IMPRESSION: Left IJ line tip at low SVC, without pneumothorax. Cardiomegaly  without congestive failure. Aortic Atherosclerosis (ICD10-I70.0). Electronically Signed   By: Rockey Kilts M.D.   On: 10/17/2023 08:48   CT Head Wo Contrast Result Date: 10/16/2023 CLINICAL DATA:  Mental status change, unknown cause EXAM: CT HEAD WITHOUT CONTRAST TECHNIQUE: Contiguous axial images were obtained from the base of the skull through the vertex without intravenous contrast. RADIATION DOSE REDUCTION: This exam was performed according to the departmental dose-optimization program which includes automated exposure control, adjustment of the mA and/or kV according to patient size and/or use of iterative reconstruction technique. COMPARISON:  CT head 09/16/2020 FINDINGS: Brain: Patchy and confluent areas of decreased attenuation are noted throughout the deep and periventricular white matter of the cerebral hemispheres bilaterally, compatible with chronic microvascular ischemic disease. No evidence of large-territorial acute infarction. No parenchymal hemorrhage. No mass lesion. No extra-axial collection. No mass effect or midline shift. No hydrocephalus. Basilar cisterns are patent. Vascular: No hyperdense vessel. Atherosclerotic calcifications are present within the cavernous internal carotid arteries. Skull: No acute fracture or focal lesion. Sinuses/Orbits: Paranasal sinuses and mastoid air cells are clear. The orbits are unremarkable. Other: None. IMPRESSION: No acute intracranial abnormality. Electronically Signed   By: Morgane  Naveau M.D.   On: 10/16/2023 18:56   CT ABDOMEN PELVIS WO CONTRAST Result Date: 10/16/2023 CLINICAL DATA:  LLQ abdominal pain also with vaginal bleeding EXAM: CT ABDOMEN AND PELVIS WITHOUT CONTRAST TECHNIQUE: Multidetector CT imaging of the abdomen and pelvis was performed following the standard protocol without IV contrast. RADIATION DOSE REDUCTION: This exam was performed according to the departmental dose-optimization program which includes automated exposure control,  adjustment of the mA and/or kV according to patient size and/or use of iterative reconstruction technique. COMPARISON:  None Available. FINDINGS: Lower chest: Bilateral lower lobe linear atelectasis versus scarring. Coronary artery calcification. Cardiac leads partially visualized. Hepatobiliary: No focal liver abnormality. Status post cholecystectomy. No biliary dilatation. Pancreas: Diffusely atrophic. No focal lesion. Otherwise normal pancreatic contour. No surrounding inflammatory changes. No main pancreatic ductal dilatation. Spleen: Normal in size without focal abnormality. Adrenals/Urinary Tract: No adrenal nodule bilaterally. Punctate left renal calcification. No hydronephrosis. No definite contour-deforming renal mass. No ureterolithiasis or hydroureter. The urinary bladder is unremarkable. Stomach/Bowel: Stomach is within normal limits. No evidence of bowel wall thickening or dilatation. Slightly hypertrophic ileocecal valve. Appendix appears normal. Vascular/Lymphatic: No abdominal aorta or iliac aneurysm. Severe atherosclerotic plaque of the aorta and its branches. No abdominal, pelvic, or inguinal lymphadenopathy. Reproductive: Status post hysterectomy. No adnexal masses. Other: No intraperitoneal free fluid. No intraperitoneal free gas. No organized fluid collection. Musculoskeletal: No abdominal wall hernia or abnormality. No suspicious lytic or blastic osseous lesions. No acute displaced fracture. Multilevel degenerative changes of the spine. IMPRESSION: 1. No acute intra-abdominal or intrapelvic abnormality with limited evaluation on this noncontrast study. 2. Punctate nonobstructive left nephrolithiasis. 3. Status post cholecystectomy and hysterectomy. 4.  Aortic Atherosclerosis (ICD10-I70.0). Electronically Signed   By: Morgane  Naveau M.D.   On: 10/16/2023 18:54   DG Chest Portable 1 View Result Date: 10/16/2023 CLINICAL DATA:  Chest pain. EXAM: PORTABLE CHEST 1 VIEW COMPARISON:  February 09, 2021. FINDINGS: Stable cardiomediastinal silhouette. Left-sided pacemaker is unchanged. Both lungs are clear. The visualized skeletal structures are unremarkable. IMPRESSION: No active disease. Electronically Signed   By: Lynwood Landy Raddle M.D.   On: 10/16/2023 15:25  Labs   CBC: Recent Labs  Lab 10/16/23 1520 10/17/23 0441  WBC 11.8* 13.8*  HGB 13.9 11.7*  HCT 44.3 38.9  MCV 100.0 102.6*  PLT 301 261    Basic Metabolic Panel: Recent Labs  Lab 10/16/23 1520 10/17/23 0441  NA 142 136  K 5.7* 6.2*  CL 106 105  CO2 10* 7*  GLUCOSE 28* 181*  BUN 91* 93*  CREATININE 8.32* 8.36*  CALCIUM  8.9 7.9*  MG 1.5* 2.0  PHOS  --  9.5*   GFR: Estimated Creatinine Clearance: 6.6 mL/min (A) (by C-G formula based on SCr of 8.36 mg/dL (H)). Recent Labs  Lab 10/16/23 1520 10/17/23 0441  WBC 11.8* 13.8*   Liver Function Tests: Recent Labs  Lab 10/16/23 1520 10/17/23 0441  AST 32 25  ALT 17 15  ALKPHOS 83 76  BILITOT 0.7 0.8  PROT 7.0 5.9*  ALBUMIN  3.4* 2.9*   No results for input(s): LIPASE, AMYLASE in the last 168 hours. No results for input(s): AMMONIA in the last 168 hours.  ABG    Component Value Date/Time   PHART 7.03 (LL) 10/17/2023 0746   PCO2ART 25 (L) 10/17/2023 0746   PO2ART 98 10/17/2023 0746   HCO3 6.6 (L) 10/17/2023 0746   ACIDBASEDEF 22.9 (H) 10/17/2023 0746   O2SAT 98.9 10/17/2023 0746    Coagulation Profile: Recent Labs  Lab 10/16/23 1520  INR 1.3*   Cardiac Enzymes: Recent Labs  Lab 10/16/23 1520  CKTOTAL 230   HbA1C: Hgb A1c MFr Bld  Date/Time Value Ref Range Status  01/17/2021 04:43 AM 11.5 (H) 4.8 - 5.6 % Final    Comment:    (NOTE)         Prediabetes: 5.7 - 6.4         Diabetes: >6.4         Glycemic control for adults with diabetes: <7.0   01/15/2021 06:06 AM 11.5 (H) 4.8 - 5.6 % Final    Comment:    (NOTE)         Prediabetes: 5.7 - 6.4         Diabetes: >6.4         Glycemic control for adults with diabetes: <7.0     CBG: Recent Labs  Lab 10/17/23 0005 10/17/23 0154 10/17/23 0442 10/17/23 0748 10/17/23 0844  GLUCAP 140* 161* 171* 180* 241*   Review of Systems:   Unable to be obtained secondary to the patient's altered mental status.   Past Medical History  She,  has a past medical history of Afib (HCC), Atrial fibrillation (HCC), Diabetes mellitus without complication (HCC), DM (diabetes mellitus) (HCC), Dysrhythmia, HTN (hypertension), Hypertension, and Stroke (HCC) (07/21/12).   Surgical History    Past Surgical History:  Procedure Laterality Date   ABDOMINAL HYSTERECTOMY     CARPAL TUNNEL RELEASE     COLONOSCOPY  01/02/2009   Dr Dessa diverticulosis.   COLONOSCOPY WITH PROPOFOL  N/A 06/28/2017   Procedure: COLONOSCOPY WITH PROPOFOL ;  Surgeon: Dessa Reyes ORN, MD;  Location: [facility] ENDOSCOPY;  Service: Endoscopy;  Laterality: N/A;   ELBOW SURGERY     INSERT / REPLACE / REMOVE PACEMAKER     Dr Ammon   JOINT REPLACEMENT  2005   knee   SHOULDER ARTHROSCOPY       Social History   reports that she has never smoked. She has never used smokeless tobacco. She reports that she does not drink alcohol and does not use drugs.   Family History  Her family history includes Breast cancer in her mother; Prostate cancer in her father.   Allergies Allergies  Allergen Reactions   Codeine Other (See Comments)    GI Distress GI Distress GI Distress   Codeine    Digoxin     Other reaction(s): Unknown   Lanoxin [Digoxin]    Penicillins    Zoloft [Sertraline Hcl]    Penicillins Rash and Other (See Comments)     Home Medications  Prior to Admission medications   Medication Sig Start Date End Date Taking? Authorizing Provider  acetaminophen  (TYLENOL ) 325 MG tablet Take 2 tablets (650 mg total) by mouth every 6 (six) hours as needed for mild pain, headache, fever or moderate pain. 09/21/20   Elgergawy, Brayton RAMAN, MD  albuterol  (PROVENTIL ) (2.5 MG/3ML) 0.083% nebulizer solution Inhale 3  mLs into the lungs every 4 (four) hours as needed for wheezing or shortness of breath. 09/21/20   Elgergawy, Brayton RAMAN, MD  albuterol  (VENTOLIN  HFA) 108 (90 Base) MCG/ACT inhaler Inhale 2 puffs into the lungs every 6 (six) hours as needed for wheezing or shortness of breath. 08/12/20   [provider]  allopurinol  (ZYLOPRIM ) 100 MG tablet Take 100 mg by mouth daily. 11/28/20   [provider]  atorvastatin  (LIPITOR) 20 MG tablet Take 20 mg by mouth daily.    [provider]  colchicine  0.6 MG tablet Take 1 tablet (0.6 mg total) by mouth daily. 02/11/21   Jens Durand, MD  ELIQUIS  5 MG TABS tablet Take 5 mg by mouth 2 (two) times daily. 01/07/21   [provider]  famotidine  (PEPCID ) 20 MG tablet Take 20 mg by mouth 2 (two) times daily.    [provider]  furosemide  (LASIX ) 20 MG tablet Take 1 tablet (20 mg total) by mouth daily as needed (weight gain >3 lbs in 1 day and >5 lbs in 2 days). 10/20/20   Tobie Yetta HERO, MD  glipiZIDE  (GLUCOTROL  XL) 5 MG 24 hr tablet Take 5 mg by mouth daily. 01/04/21   [provider]  insulin  aspart (NOVOLOG ) 100 UNIT/ML injection Inject 0-9 Units into the skin 3 (three) times daily with meals. 09/21/20   Elgergawy, Brayton RAMAN, MD  levETIRAcetam  (KEPPRA ) 1000 MG tablet Take 1,000 mg by mouth 2 (two) times daily. 01/08/21   [provider]  lisinopril  (ZESTRIL ) 5 MG tablet Take 5 mg by mouth daily. 11/07/20   [provider]  metFORMIN  (GLUCOPHAGE ) 1000 MG tablet Take 1,000 mg by mouth 2 (two) times daily. 11/28/20   [provider]  polyethylene glycol (MIRALAX  / GLYCOLAX ) 17 g packet Take 17 g by mouth 2 (two) times daily. 10/20/20   Patel, Pranav M, MD  Scheduled Meds:  atorvastatin   20 mg Oral Daily   Chlorhexidine  Gluconate Cloth  6 each Topical Daily   insulin  aspart  0-9 Units Subcutaneous Q4H   levETIRAcetam   1,000 mg Intravenous Q24H   sodium zirconium cyclosilicate   10 g Oral BID    Continuous Infusions:  cefTRIAXone  (ROCEPHIN )  IV Stopped (10/17/23 0033)   famotidine  (PEPCID ) IV 100 mL/hr at 10/17/23 1009   heparin      metronidazole  Stopped (10/17/23 0157)   norepinephrine  (LEVOPHED ) Adult infusion 25 mcg/min (10/17/23 1009)   prismasol  BGK 0/2.5     prismasol  BGK 0/2.5     prismasol  BGK 0/2.5     sodium bicarbonate  150 mEq in sterile water  1,150 mL infusion 100 mL/hr at 10/17/23 1009   vasopressin  0.03 Units/min (10/17/23 1009)  PRN Meds:.heparin   Active Hospital Problem list   See systems below  Assessment & Plan:  #Septic shock - PRESENT ON ADMISSION - due to UTI and probable intrabdominal source (nv and diarrhea) Hx of E. Coli and Enterococcus faecalis UTI  Presenting with altered mental status, UA positive for UTI -F/u cultures, trend lactic/ PCT -Monitor WBC/ fever curve -Continue IV abx pending cultures -GI panel pending -IVF hydration as needed -Pressors for MAP goal >65 -Strict I/O's    #AKI on CKD stage III: AKI likely iso above #AGMA #Hyperkalemia #Hypomagnesium -Monitor I&O's / urinary output -Follow BMP -Ensure adequate renal perfusion -Avoid nephrotoxic agents as able -Replace electrolytes as indicated  #Paroxysmal Afib  Dual-chamber pacemaker for complete heart block #HTN:  #HLD:  -Hold home lisinopril .  -Continue Eliquis  -Continued home statin -Replete K<4, Mg<2   #Hx of seizures -Continued home keppra , will switch to IV while npo -PRN Lorazepam  for breakthrough seizure -seizure precaution  #Anxiety and Depression -No home meds   #T2DM #Peripheral neuropathy: In s/o diabetes history. Unclear if still taking lyrica  or Cymbalta   Presenting with hypoglycemia 23 -CBG's q2 -Target range of 140 to 180, avoid hypoglycemia -Hold SSI and Lantus  until stable -Follow ICU Hypo/Hyperglycemia protocol -Hold home Glipizide  and Metformin   #Gout -check uric acid -Hold Allopurinol  and colchicine  ISO AKI  #FTT #Falls  Risk She uses a walker at baseline and will use a wheelchair which she goes to the doctor's office. Recent multiple falls with decreased po intake -PT/OT -TOC consult for SNF placement consideration -Dietary consult -Palliative consult  #OSA not on CPAP Remote hx of Asthma/COPD -Duonebs scheduled and PRN  Best practice:  Diet:  Oral Pain/Anxiety/Delirium protocol (if indicated): No VAP protocol (if indicated): Not indicated DVT prophylaxis: Systemic AC GI prophylaxis: H2B Glucose control:  SSI Yes Central venous access:  N/A Arterial line:  N/A Foley:  N/A Mobility:  bed rest  PT consulted: Yes Last date of multidisciplinary goals of care discussion []  Code Status:  full code Disposition: ICU   = Goals of Care = Code Status Order: FULL  Primary Emergency Contact: Williams,Christy Wishes to pursue full aggressive treatment and intervention options, including CPR and intubation, but goals of care will be addressed on going with family if that should become necessary.  Critical care provider statement:   Total critical care time: 33 minutes   Performed by: Parris MD   Critical care time was exclusive of separately billable procedures and treating other patients.   Critical care was necessary to treat or prevent imminent or life-threatening deterioration.   Critical care was time spent personally by me on the following activities: development of treatment plan with patient and/or surrogate as well as nursing, discussions with consultants, evaluation of patient's response to treatment, examination of patient, obtaining history from patient or surrogate, ordering and performing treatments and interventions, ordering and review of laboratory studies, ordering and review of radiographic studies, pulse oximetry and re-evaluation of patient's condition.    Laritza Vokes, M.D.  Pulmonary & Critical Care Medicine

## 2023-10-17 NOTE — Progress Notes (Signed)
 Pharmacy Antibiotic Note  Traci Carter is a 78 y.o. female w/ PMH of paroxysmal atrial fibrillation, pacemaker for complete heart block, hypertension, hyperlipidemia, prior CVA, diabetes admitted on 10/16/2023 with sepsis.  Pharmacy has been consulted for vancomycin  dosing. CRRT was started this morning  Plan: start vancomycin  2000 mg IV x 1, then 1500 mg IV every 24 hours ---will plan to order vancomycin  trough before 3rd or 4th maintenance dose ---trough goal level 10 -  15 mcg/mL  Height: 5' 3 (160 cm) Weight: 103.1 kg (227 lb 4.7 oz) IBW/kg (Calculated) : 52.4  Temp (24hrs), Avg:96.5 F (35.8 C), Min:95.4 F (35.2 C), Max:97.6 F (36.4 C)  Recent Labs  Lab 10/16/23 1520 10/17/23 0441 10/17/23 1003 10/17/23 1105  WBC 11.8* 13.8*  --   --   CREATININE 8.32* 8.36* 8.16*  --   LATICACIDVEN  --   --  8.4* 8.8*    Estimated Creatinine Clearance: 6.7 mL/min (A) (by C-G formula based on SCr of 8.16 mg/dL (H)).    Allergies  Allergen Reactions   Codeine Other (See Comments)    GI Distress GI Distress GI Distress   Codeine    Digoxin     Other reaction(s): Unknown   Lanoxin [Digoxin]    Penicillins    Zoloft [Sertraline Hcl]    Penicillins Rash and Other (See Comments)    Antimicrobials this admission: 07/15 vancomycin  >>  07/15 metronidazole  >> 07/15 ceftriaxone  >>   Microbiology results: 07/14 UCx: pending  07/15 MRSA PCR: negative  Thank you for allowing pharmacy to be a part of this patient's care.  Traci Carter 10/17/2023 11:49 AM

## 2023-10-17 NOTE — Procedures (Signed)
 Central Venous Catheter Insertion Procedure Note  Traci Carter  [id number]  Apr 16, 1945  Date:10/17/23  Time:11:43 AM   Provider Performing:Kemauri Musa Traci Carter   Procedure: Insertion of Non-tunneled Central Venous Catheter(36556)with US  guidance (23062)    Indication(s) Hemodialysis  Consent Risks of the procedure as well as the alternatives and risks of each were explained to the patient and/or caregiver.  Consent for the procedure was obtained and is signed in the bedside chart  Anesthesia Topical only with 1% lidocaine   Timeout Verified patient identification, verified procedure, site/side was marked, verified correct patient position, special equipment/implants available, medications/allergies/relevant history reviewed, required imaging and test results available.  Sterile Technique Maximal sterile technique including full sterile barrier drape, hand hygiene, sterile gown, sterile gloves, mask, hair covering, sterile ultrasound probe cover (if used).  Procedure Description Area of catheter insertion was cleaned with chlorhexidine  and draped in sterile fashion.   With real-time ultrasound guidance a HD catheter was placed into the right internal jugular vein.  Nonpulsatile blood flow and easy flushing noted in all ports.  The catheter was sutured in place and sterile dressing applied.  Complications/Tolerance None; patient tolerated the procedure well. Chest X-ray is ordered to verify placement for internal jugular or subclavian cannulation.  Chest x-ray is not ordered for femoral cannulation.  EBL Minimal  Specimen(s) None  Traci Carter, AGNP  Pulmonary/Critical Care Pager [phone] (please enter 7 digits) PCCM Consult Pager [phone] (please enter 7 digits)

## 2023-10-17 NOTE — Progress Notes (Addendum)
 1330 Report received from [facility] 1600 VBG improving. Dr Parris notified.1830 Dr. Parris notified of Troponin of 109. No new orders received. 8164 Dr, Dennise paged for patients potassium of 3.6

## 2023-10-17 NOTE — Progress Notes (Signed)
 PHARMACY - ANTICOAGULATION CONSULT NOTE  Pharmacy Consult for heparin  infusion Indication: atrial fibrillation  Allergies  Allergen Reactions   Codeine Other (See Comments)    GI Distress GI Distress GI Distress   Codeine    Digoxin     Other reaction(s): Unknown   Lanoxin [Digoxin]    Penicillins    Zoloft [Sertraline Hcl]    Penicillins Rash and Other (See Comments)    Patient Measurements: Height: 5' 3 (160 cm) Weight: 103.1 kg (227 lb 4.7 oz) IBW/kg (Calculated) : 52.4 HEPARIN  DW (KG): 76.8  Vital Signs: Temp: 96.1 F (35.6 C) (07/15 0445) Temp Source: Axillary (07/15 0445) BP: 97/46 (07/15 0700) Pulse Rate: 102 (07/15 0645)  Labs: Recent Labs    10/16/23 1520 10/17/23 0441  HGB 13.9 11.7*  HCT 44.3 38.9  PLT 301 261  LABPROT 17.3*  --   INR 1.3*  --   CREATININE 8.32* 8.36*  CKTOTAL 230  --   TROPONINIHS 53*  --     Estimated Creatinine Clearance: 6.6 mL/min (A) (by C-G formula based on SCr of 8.36 mg/dL (H)).   Medical History: Past Medical History:  Diagnosis Date   Afib (HCC)    Atrial fibrillation (HCC)    Diabetes mellitus without complication (HCC)    DM (diabetes mellitus) (HCC)    Dysrhythmia    HTN (hypertension)    Hypertension    Stroke (HCC) 07/21/12   posterior superior left frontal lobe parenchymal hemorrhage     Medications:  Scheduled:   atorvastatin   20 mg Oral Daily   Chlorhexidine  Gluconate Cloth  6 each Topical Daily   insulin  aspart  10 Units Intravenous Once   And   dextrose   1 ampule Intravenous Once   famotidine   20 mg Oral BID   insulin  aspart  0-9 Units Subcutaneous Q4H   levETIRAcetam   1,000 mg Oral BID   sodium bicarbonate   50 mEq Intravenous STAT   sodium zirconium cyclosilicate   10 g Oral BID   Infusions:   cefTRIAXone  (ROCEPHIN )  IV Stopped (10/17/23 0033)   metronidazole  Stopped (10/17/23 0157)   norepinephrine  (LEVOPHED ) Adult infusion 14 mcg/min (10/17/23 0707)   sodium bicarbonate  150 mEq in  sterile water  1,150 mL infusion      Assessment: 78 y.o. female presenting with altered mental status. PMH significant for paroxysmal atrial fibrillation, pacemaker for complete heart block, hypertension, hyperlipidemia, prior CVA, diabetes. She takes apixaban  PTA with last dose 07/15 0155  Baseline Labs: Hgb 11.7, PLT wnl, INR 1.3  Goal of Therapy:  anti-Xa level 0.3-0.7 units/ml aPTT 66 - 102 seconds Monitor platelets by anticoagulation protocol: Yes   Plan:  ---Start heparin  infusion at 1200 units/hr at next scheduled dose of apixaban  (1355) ---Check aPTT in 8 hours after initiation (we will use aPTT to guide therapy until aPTT and anti-Xa level correlate) ---Continue to monitor H&H and platelets  [name] 10/17/2023,7:27 AM

## 2023-10-17 NOTE — Consult Note (Signed)
 [facility]  CONSULT NOTE    Date: 10/17/2023                  Patient Name:  Traci Carter  MRN: [id number]  DOB: 07-01-1945  Age / Sex: 78 y.o., female         PCP: Traci Honor [name], MD                 Service Requesting Consult: Critical care team                 Reason for Consult: Acute kidney injury            History of Present Illness: [name] is a 78 y.o.  female with past medical conditions including GERD, CVA, hypertension, diabetes, hyperlipidemia, CAD, gout, obstructive sleep apnea, UTI, chronic kidney disease stage III, who was admitted to [facility] on 10/16/2023 for Altered mental status [R41.82] Altered mental status, unspecified altered mental status type [R41.82]   Patient presents to the emergency department with increased confusion and nausea and vomiting.  Patient is seen and evaluated at bedside in intensive care unit.  No family present during initial visit.  Patient alert and oriented, responding to questions appropriately.  Some deviation noted when asked to repeat what was told/explained.  Chart review reveals patient has recently placed her husband in hospice care.  Daughter is assisting with care of her husband.  There is also a note stating possible palliative consult pending for this patient as well.  Labs on ED arrival concerning for potassium 5.7, serum bicarb 10, glucose 28, BUN 91, creatinine 8.32 with GFR 5, magnesium  1.5, BNP 559, troponin 53, and white count 11.8.  Chest x-ray negative for acute findings CT abdomen pelvis no acute findings.  CT head also negative.  Respiratory panel negative for influenza, COVID-19, and RSV.  Glucose corrected to 211.  Lactic acid elevated at 8.4.  UA with large leukocytes and many bacteria.  Urine culture pending.   Medications: Outpatient medications: Medications Prior to Admission  Medication Sig Dispense Refill Last Dose/Taking   lisinopril  (ZESTRIL ) 2.5 MG tablet Take 2.5 mg  by mouth daily.   Taking   acetaminophen  (TYLENOL ) 325 MG tablet Take 2 tablets (650 mg total) by mouth every 6 (six) hours as needed for mild pain, headache, fever or moderate pain.      albuterol  (PROVENTIL ) (2.5 MG/3ML) 0.083% nebulizer solution Inhale 3 mLs into the lungs every 4 (four) hours as needed for wheezing or shortness of breath. 75 mL 12    albuterol  (VENTOLIN  HFA) 108 (90 Base) MCG/ACT inhaler Inhale 2 puffs into the lungs every 6 (six) hours as needed for wheezing or shortness of breath.      allopurinol  (ZYLOPRIM ) 100 MG tablet Take 100 mg by mouth daily.      atorvastatin  (LIPITOR) 20 MG tablet Take 20 mg by mouth daily.      colchicine  0.6 MG tablet Take 1 tablet (0.6 mg total) by mouth daily. (Patient taking differently: Take 0.6 mg by mouth 2 (two) times daily. Take while under gout flare)   Unknown   ELIQUIS  5 MG TABS tablet Take 5 mg by mouth 2 (two) times daily.      famotidine  (PEPCID ) 20 MG tablet Take 20 mg by mouth daily.      furosemide  (LASIX ) 20 MG tablet Take 1 tablet (20 mg total) by mouth daily as needed (weight gain >3 lbs in 1 day and >  5 lbs in 2 days). (Patient taking differently: Take 20 mg by mouth daily.) 30 tablet 11 Unknown   glipiZIDE  (GLUCOTROL  XL) 2.5 MG 24 hr tablet Take 2.5 mg by mouth daily.      insulin  aspart (NOVOLOG ) 100 UNIT/ML injection Inject 0-9 Units into the skin 3 (three) times daily with meals. 10 mL 11    levETIRAcetam  (KEPPRA ) 1000 MG tablet Take 1,000 mg by mouth 2 (two) times daily.      metFORMIN  (GLUCOPHAGE ) 1000 MG tablet Take 1,000 mg by mouth 2 (two) times daily.      polyethylene glycol (MIRALAX  / GLYCOLAX ) 17 g packet Take 17 g by mouth 2 (two) times daily. 14 each 0     Current medications: Current Facility-Administered Medications  Medication Dose Route Frequency Provider Last Rate Last Admin   atorvastatin  (LIPITOR) tablet 20 mg  20 mg Oral Daily Ouma, Elizabeth Achieng, NP   20 mg at 10/17/23 9072   cefTRIAXone  (ROCEPHIN )  2 g in sodium chloride  0.9 % 100 mL IVPB  2 g Intravenous Q24H Kathrene Almarie Bake, NP   Stopped at 10/17/23 0033   Chlorhexidine  Gluconate Cloth 2 % PADS 6 each  6 each Topical Daily Aleskerov, Fuad, MD   6 each at 10/17/23 1044   famotidine  (PEPCID ) IVPB 20 mg premix  20 mg Intravenous Q24H Nada Adriana BIRCH, [facility]   Stopped at 10/17/23 1022   heparin  ADULT infusion 100 units/mL (25000 units/250mL)  1,200 Units/hr Intravenous Continuous Nada Adriana BIRCH, RPH 12 mL/hr at 10/17/23 1305 1,200 Units/hr at 10/17/23 1305   heparin  injection 1,000-6,000 Units  1,000-6,000 Units CRRT PRN Druscilla Bald, NP       insulin  aspart (novoLOG ) injection 0-9 Units  0-9 Units Subcutaneous Q4H Kathrene Almarie Bake, NP   3 Units at 10/17/23 9071   levETIRAcetam  (KEPPRA ) undiluted injection 1,000 mg  1,000 mg Intravenous Q12H Nada Adriana BIRCH, RPH       metroNIDAZOLE  (FLAGYL ) IVPB 500 mg  500 mg Intravenous Q12H Kathrene Almarie Bake, NP   Stopped at 10/17/23 0157   norepinephrine  (LEVOPHED ) 16 mg in 250mL (0.064 mg/mL) premix infusion  0-40 mcg/min Intravenous Titrated Nelson, Dana G, NP 30.9 mL/hr at 10/17/23 1226 33 mcg/min at 10/17/23 1226   prismasol  BGK 0/2.5 infusion   CRRT Continuous Druscilla Bald, NP 400 mL/hr at 10/17/23 1209 New Bag at 10/17/23 1209   prismasol  BGK 0/2.5 infusion   CRRT Continuous Druscilla Bald, NP 400 mL/hr at 10/17/23 1208 New Bag at 10/17/23 1208   prismasol  BGK 0/2.5 infusion   CRRT Continuous Druscilla Bald, NP 1,500 mL/hr at 10/17/23 1537 1,500 mL/hr at 10/17/23 1537   sodium bicarbonate  150 mEq in sterile water  1,150 mL infusion   Intravenous Continuous Nelson, Dana G, NP 100 mL/hr at 10/17/23 1226 Infusion Verify at 10/17/23 1226   sodium zirconium cyclosilicate  (LOKELMA ) packet 10 g  10 g Oral BID Nelson, Dana G, NP   10 g at 10/17/23 9072   thiamine  (VITAMIN B1) injection 100 mg  100 mg Intravenous QHS Nada Adriana BIRCH, RPH       [START ON 10/18/2023] vancomycin   (VANCOREADY) IVPB 1500 mg/300 mL  1,500 mg Intravenous Q24H Nada Adriana BIRCH, RPH       vancomycin  (VANCOREADY) IVPB 2000 mg/400 mL  2,000 mg Intravenous Once Nada Adriana BIRCH, RPH 200 mL/hr at 10/17/23 1408 2,000 mg at 10/17/23 1408   vasopressin  (PITRESSIN) 20 Units in 100 mL (0.2 unit/mL) infusion-*FOR SHOCK*  0-0.03 Units/min Intravenous Continuous  Maranda Lonell MATSU, NP 9 mL/hr at 10/17/23 1226 0.03 Units/min at 10/17/23 1226      Allergies: Allergies  Allergen Reactions   Codeine Other (See Comments)    GI Distress GI Distress GI Distress   Codeine    Digoxin     Other reaction(s): Unknown   Lanoxin [Digoxin]    Penicillins    Zoloft [Sertraline Hcl]    Penicillins Rash and Other (See Comments)      Past Medical History: Past Medical History:  Diagnosis Date   Afib (HCC)    Atrial fibrillation (HCC)    Diabetes mellitus without complication (HCC)    DM (diabetes mellitus) (HCC)    Dysrhythmia    HTN (hypertension)    Hypertension    Stroke (HCC) 07/21/12   posterior superior left frontal lobe parenchymal hemorrhage      Past Surgical History: Past Surgical History:  Procedure Laterality Date   ABDOMINAL HYSTERECTOMY     CARPAL TUNNEL RELEASE     COLONOSCOPY  01/02/2009   Dr Dessa diverticulosis.   COLONOSCOPY WITH PROPOFOL  N/A 06/28/2017   Procedure: COLONOSCOPY WITH PROPOFOL ;  Surgeon: Dessa Reyes ORN, MD;  Location: [facility] ENDOSCOPY;  Service: Endoscopy;  Laterality: N/A;   ELBOW SURGERY     INSERT / REPLACE / REMOVE PACEMAKER     Dr Ammon   JOINT REPLACEMENT  2005   knee   SHOULDER ARTHROSCOPY       Family History: Family History  Problem Relation Age of Onset   Breast cancer Mother    Prostate cancer Father      Social History: Social History   Socioeconomic History   Marital status: Married    Spouse name: Not on file   Number of children: Not on file   Years of education: Not on file   Highest education level: Not on file   Occupational History   Not on file  Tobacco Use   Smoking status: Never   Smokeless tobacco: Never  Vaping Use   Vaping status: Never Used  Substance and Sexual Activity   Alcohol use: Never    Comment: rare   Drug use: No   Sexual activity: Not on file  Other Topics Concern   Not on file  Social History Narrative   ** Merged History Encounter **       Social Drivers of Health   Financial Resource Strain: Low Risk  (09/25/2020)   Received from [facility]   Overall Financial Resource Strain (CARDIA)    Difficulty of Paying Living Expenses: Not hard at all  Food Insecurity: No Food Insecurity (09/25/2020)   Received from [facility]   Hunger Vital Sign    Within the past 12 months, you worried that your food would run out before you got the money to buy more.: Never true    Within the past 12 months, the food you bought just didn't last and you didn't have money to get more.: Never true  Transportation Needs: Unmet Transportation Needs (06/28/2023)   Received from [facility] - Transportation    In the past 12 months, has lack of transportation kept you from medical appointments or from getting medications?: Yes    Lack of Transportation (Non-Medical): Not on file  Physical Activity: Not on file  Stress: Not on file  Social Connections: Not on file  Intimate Partner Violence: Not on file     Review of Systems: Review of Systems  Unable to perform ROS: Mental status change    Vital Signs: Blood pressure 109/69, pulse (!) 109, temperature 98.6 F (37 C), resp. rate (!) 21, height 5' 3 (1.6 m), weight 103.1 kg, SpO2 92%.  Weight trends: Filed Weights   10/16/23 2241  Weight: 103.1 kg    Physical Exam: General: Ill-appearing  Head: Normocephalic, atraumatic. Moist oral mucosal membranes  Eyes: Anicteric  Lungs:  Coarse  Heart: Regular rate and rhythm  Abdomen:  Soft, nontender  Extremities: No peripheral edema.   Neurologic: Alert  Skin: No lesions        Lab results: Basic Metabolic Panel: Recent Labs  Lab 10/16/23 1520 10/17/23 0441 10/17/23 1003  NA 142 136 137  K 5.7* 6.2* 4.9  CL 106 105 103  CO2 10* 7* 10*  GLUCOSE 28* 181* 211*  BUN 91* 93* 96*  CREATININE 8.32* 8.36* 8.16*  CALCIUM  8.9 7.9* 7.9*  MG 1.5* 2.0  --   PHOS  --  9.5*  --     Liver Function Tests: Recent Labs  Lab 10/16/23 1520 10/17/23 0441  AST 32 25  ALT 17 15  ALKPHOS 83 76  BILITOT 0.7 0.8  PROT 7.0 5.9*  ALBUMIN  3.4* 2.9*   No results for input(s): LIPASE, AMYLASE in the last 168 hours. No results for input(s): AMMONIA in the last 168 hours.  CBC: Recent Labs  Lab 10/16/23 1520 10/17/23 0441  WBC 11.8* 13.8*  HGB 13.9 11.7*  HCT 44.3 38.9  MCV 100.0 102.6*  PLT 301 261    Cardiac Enzymes: Recent Labs  Lab 10/16/23 1520  CKTOTAL 230    BNP: Invalid input(s): POCBNP  CBG: Recent Labs  Lab 10/17/23 0442 10/17/23 0748 10/17/23 0844 10/17/23 1132 10/17/23 1514  GLUCAP 171* 180* 241* 155* 97    Microbiology: Results for orders placed or performed during the hospital encounter of 10/16/23  MRSA Next Gen by PCR, Nasal     Status: None   Collection Time: 10/16/23 11:05 PM   Specimen: Nasal Mucosa; Nasal Swab  Result Value Ref Range Status   MRSA by PCR Next Gen NOT DETECTED NOT DETECTED Final    Comment: (NOTE) The GeneXpert MRSA Assay (FDA approved for NASAL specimens only), is one component of a comprehensive MRSA colonization surveillance program. It is not intended to diagnose MRSA infection nor to guide or monitor treatment for MRSA infections. Test performance is not FDA approved in patients less than 34 years old. Performed at [facility], [address] [place], [place] [address]   Resp panel by RT-PCR (RSV, Flu A&B, Covid) Anterior Nasal Swab     Status: None   Collection Time: 10/17/23 10:03 AM   Specimen: Anterior Nasal Swab  Result  Value Ref Range Status   SARS Coronavirus 2 by RT PCR NEGATIVE NEGATIVE Final    Comment: (NOTE) SARS-CoV-2 target nucleic acids are NOT DETECTED.  The SARS-CoV-2 RNA is generally detectable in upper respiratory specimens during the acute phase of infection. The lowest concentration of SARS-CoV-2 viral copies this assay can detect is 138 copies/mL. A negative result does not preclude SARS-Cov-2 infection and should not be used as the sole basis for treatment or other patient management decisions. A negative result may occur with  improper specimen collection/handling, submission of specimen other than nasopharyngeal swab, presence of viral mutation(s) within the areas targeted by this assay, and inadequate number of viral copies(<138 copies/mL). A negative result must be combined with clinical observations, patient history,  and epidemiological information. The expected result is Negative.  Fact Sheet for Patients:  BloggerCourse.com  Fact Sheet for Healthcare Providers:  SeriousBroker.it  This test is no t yet approved or cleared by the [place]  FDA and  has been authorized for detection and/or diagnosis of SARS-CoV-2 by FDA under an Emergency Use Authorization (EUA). This EUA will remain  in effect (meaning this test can be used) for the duration of the COVID-19 declaration under Section 564(b)(1) of the Act, 21 U.S.C.section 360bbb-3(b)(1), unless the authorization is terminated  or revoked sooner.       Influenza A by PCR NEGATIVE NEGATIVE Final   Influenza B by PCR NEGATIVE NEGATIVE Final    Comment: (NOTE) The Xpert Xpress SARS-CoV-2/FLU/RSV plus assay is intended as an aid in the diagnosis of influenza from Nasopharyngeal swab specimens and should not be used as a sole basis for treatment. Nasal washings and aspirates are unacceptable for Xpert Xpress SARS-CoV-2/FLU/RSV testing.  Fact Sheet for  Patients: BloggerCourse.com  Fact Sheet for Healthcare Providers: SeriousBroker.it  This test is not yet approved or cleared by the [place]  FDA and has been authorized for detection and/or diagnosis of SARS-CoV-2 by FDA under an Emergency Use Authorization (EUA). This EUA will remain in effect (meaning this test can be used) for the duration of the COVID-19 declaration under Section 564(b)(1) of the Act, 21 U.S.C. section 360bbb-3(b)(1), unless the authorization is terminated or revoked.     Resp Syncytial Virus by PCR NEGATIVE NEGATIVE Final    Comment: (NOTE) Fact Sheet for Patients: BloggerCourse.com  Fact Sheet for Healthcare Providers: SeriousBroker.it  This test is not yet approved or cleared by the [place]  FDA and has been authorized for detection and/or diagnosis of SARS-CoV-2 by FDA under an Emergency Use Authorization (EUA). This EUA will remain in effect (meaning this test can be used) for the duration of the COVID-19 declaration under Section 564(b)(1) of the Act, 21 U.S.C. section 360bbb-3(b)(1), unless the authorization is terminated or revoked.  Performed at [facility], [address] [place], [place] [address]     Coagulation Studies: Recent Labs    10/16/23 1520  LABPROT 17.3*  INR 1.3*    Urinalysis: Recent Labs    10/16/23 2204  COLORURINE YELLOW*  LABSPEC 1.010  PHURINE 5.0  GLUCOSEU NEGATIVE  HGBUR SMALL*  BILIRUBINUR NEGATIVE  KETONESUR NEGATIVE  PROTEINUR NEGATIVE  NITRITE NEGATIVE  LEUKOCYTESUR LARGE*      Imaging: DG Chest Port 1 View Result Date: 10/17/2023 CLINICAL DATA:  Central line placement EXAM: PORTABLE CHEST 1 VIEW COMPARISON:  Same day chest radiograph and prior studies FINDINGS: Interval placement of right IJ approach central venous catheter with catheter in place with tip at the cavoatrial  junction/upper right atrium. Unchanged left IJ approach central venous catheter. Unchanged cardiac pacemaker. Low lung volumes. No pleural effusions. No pneumothorax. Unchanged cardiomediastinal silhouette. IMPRESSION: Interval placement of right IJ approach central venous catheter with tip at the cavoatrial junction/upper right atrium. Unchanged left IJ approach central venous catheter. No pneumothorax. Electronically Signed   By: Michaeline Blanch M.D.   On: 10/17/2023 12:37   DG Chest Port 1 View Result Date: 10/17/2023 CLINICAL DATA:  Central line placement EXAM: PORTABLE CHEST 1 VIEW COMPARISON:  Yesterday FINDINGS: Placement of a left IJ line with tip at low SVC. Patient rotated left. Midline trachea. Mild cardiomegaly. Atherosclerosis in the transverse aorta. No pleural effusion or pneumothorax. No congestive failure. Clear lungs. IMPRESSION: Left IJ line tip at low  SVC, without pneumothorax. Cardiomegaly without congestive failure. Aortic Atherosclerosis (ICD10-I70.0). Electronically Signed   By: Rockey Kilts M.D.   On: 10/17/2023 08:48   CT Head Wo Contrast Result Date: 10/16/2023 CLINICAL DATA:  Mental status change, unknown cause EXAM: CT HEAD WITHOUT CONTRAST TECHNIQUE: Contiguous axial images were obtained from the base of the skull through the vertex without intravenous contrast. RADIATION DOSE REDUCTION: This exam was performed according to the departmental dose-optimization program which includes automated exposure control, adjustment of the mA and/or kV according to patient size and/or use of iterative reconstruction technique. COMPARISON:  CT head 09/16/2020 FINDINGS: Brain: Patchy and confluent areas of decreased attenuation are noted throughout the deep and periventricular white matter of the cerebral hemispheres bilaterally, compatible with chronic microvascular ischemic disease. No evidence of large-territorial acute infarction. No parenchymal hemorrhage. No mass lesion. No extra-axial  collection. No mass effect or midline shift. No hydrocephalus. Basilar cisterns are patent. Vascular: No hyperdense vessel. Atherosclerotic calcifications are present within the cavernous internal carotid arteries. Skull: No acute fracture or focal lesion. Sinuses/Orbits: Paranasal sinuses and mastoid air cells are clear. The orbits are unremarkable. Other: None. IMPRESSION: No acute intracranial abnormality. Electronically Signed   By: Morgane  Naveau M.D.   On: 10/16/2023 18:56   CT ABDOMEN PELVIS WO CONTRAST Result Date: 10/16/2023 CLINICAL DATA:  LLQ abdominal pain also with vaginal bleeding EXAM: CT ABDOMEN AND PELVIS WITHOUT CONTRAST TECHNIQUE: Multidetector CT imaging of the abdomen and pelvis was performed following the standard protocol without IV contrast. RADIATION DOSE REDUCTION: This exam was performed according to the departmental dose-optimization program which includes automated exposure control, adjustment of the mA and/or kV according to patient size and/or use of iterative reconstruction technique. COMPARISON:  None Available. FINDINGS: Lower chest: Bilateral lower lobe linear atelectasis versus scarring. Coronary artery calcification. Cardiac leads partially visualized. Hepatobiliary: No focal liver abnormality. Status post cholecystectomy. No biliary dilatation. Pancreas: Diffusely atrophic. No focal lesion. Otherwise normal pancreatic contour. No surrounding inflammatory changes. No main pancreatic ductal dilatation. Spleen: Normal in size without focal abnormality. Adrenals/Urinary Tract: No adrenal nodule bilaterally. Punctate left renal calcification. No hydronephrosis. No definite contour-deforming renal mass. No ureterolithiasis or hydroureter. The urinary bladder is unremarkable. Stomach/Bowel: Stomach is within normal limits. No evidence of bowel wall thickening or dilatation. Slightly hypertrophic ileocecal valve. Appendix appears normal. Vascular/Lymphatic: No abdominal aorta or  iliac aneurysm. Severe atherosclerotic plaque of the aorta and its branches. No abdominal, pelvic, or inguinal lymphadenopathy. Reproductive: Status post hysterectomy. No adnexal masses. Other: No intraperitoneal free fluid. No intraperitoneal free gas. No organized fluid collection. Musculoskeletal: No abdominal wall hernia or abnormality. No suspicious lytic or blastic osseous lesions. No acute displaced fracture. Multilevel degenerative changes of the spine. IMPRESSION: 1. No acute intra-abdominal or intrapelvic abnormality with limited evaluation on this noncontrast study. 2. Punctate nonobstructive left nephrolithiasis. 3. Status post cholecystectomy and hysterectomy. 4.  Aortic Atherosclerosis (ICD10-I70.0). Electronically Signed   By: Morgane  Naveau M.D.   On: 10/16/2023 18:54   DG Chest Portable 1 View Result Date: 10/16/2023 CLINICAL DATA:  Chest pain. EXAM: PORTABLE CHEST 1 VIEW COMPARISON:  February 09, 2021. FINDINGS: Stable cardiomediastinal silhouette. Left-sided pacemaker is unchanged. Both lungs are clear. The visualized skeletal structures are unremarkable. IMPRESSION: No active disease. Electronically Signed   By: Lynwood Landy Raddle M.D.   On: 10/16/2023 15:25     Assessment & Plan: Ms. Shaolin Armas is a 78 y.o.  female with past medical conditions including GERD, CVA, hypertension, diabetes, hyperlipidemia,  CAD, gout, obstructive sleep apnea, UTI, chronic kidney disease stage IIIa, who was admitted to [facility] on 10/16/2023 for Altered mental status [R41.82] Altered mental status, unspecified altered mental status type [R41.82]   Acute kidney injury likely secondary to septic UTI.  Baseline unknown.  UA positive for leukocytes and bacteria. No recent IV contrast exposure.  Creatinine on ED arrival 8.32.  Patient anuric at this time.  Due to critical nature and presence of pressors, we recommend starting patient on CRRT with UF net even.  Will continue to monitor patient during this  admission.  2.  Hyperkalemia/hypomagnesium.  Potassium peaked to 6.2.  Magnesium  reduced to 1.5.  Will place patient on 0K bath with CRRT.  Lokelma  ordered twice daily, would discontinue once potassium corrected.  IV supplementation ordered for magnesium  repletion.  3.  Acute metabolic acidosis.  Serum bicarb 10 on ED arrival.  This will also correct slowly with CRRT.  Can supplement with IV fluids as ordered.   4.  Sepsis likely secondary to UTI with suspected abdominal involvement based on nausea, vomiting, and diarrhea.  UA positive for UTI, urine culture pending.  Patient does have history of E. coli and Enterococcus facilities UTI.  Currently prescribed ceftriaxone , metronidazole , and vancomycin .  LOS: 1 Dejon Jungman 7/15/20253:46 PM

## 2023-10-18 LAB — BLOOD GAS, VENOUS
Acid-base deficit: 3.5 mmol/L — ABNORMAL HIGH (ref 0.0–2.0)
Acid-base deficit: 9.3 mmol/L — ABNORMAL HIGH (ref 0.0–2.0)
Bicarbonate: 17.1 mmol/L — ABNORMAL LOW (ref 20.0–28.0)
Bicarbonate: 22.7 mmol/L (ref 20.0–28.0)
O2 Saturation: 74.8 %
O2 Saturation: 76.2 %
Patient temperature: 37
Patient temperature: 37
pCO2, Ven: 38 mmHg — ABNORMAL LOW (ref 44–60)
pCO2, Ven: 44 mmHg (ref 44–60)
pH, Ven: 7.26 (ref 7.25–7.43)
pH, Ven: 7.32 (ref 7.25–7.43)
pO2, Ven: 43 mmHg (ref 32–45)
pO2, Ven: 43 mmHg (ref 32–45)

## 2023-10-18 LAB — RENAL FUNCTION PANEL
Albumin: 2.7 g/dL — ABNORMAL LOW (ref 3.5–5.0)
Albumin: 2.9 g/dL — ABNORMAL LOW (ref 3.5–5.0)
Anion gap: 12 (ref 5–15)
Anion gap: 14 (ref 5–15)
BUN: 42 mg/dL — ABNORMAL HIGH (ref 8–23)
BUN: 30 mg/dL — ABNORMAL HIGH (ref 8–23)
CO2: 23 mmol/L (ref 22–32)
CO2: 20 mmol/L — ABNORMAL LOW (ref 22–32)
Calcium: 6.9 mg/dL — ABNORMAL LOW (ref 8.9–10.3)
Calcium: 7.3 mg/dL — ABNORMAL LOW (ref 8.9–10.3)
Chloride: 102 mmol/L (ref 98–111)
Chloride: 102 mmol/L (ref 98–111)
Creatinine, Ser: 3.23 mg/dL — ABNORMAL HIGH (ref 0.44–1.00)
Creatinine, Ser: 2.2 mg/dL — ABNORMAL HIGH (ref 0.44–1.00)
GFR, Estimated: 14 mL/min — ABNORMAL LOW (ref >60)
GFR, Estimated: 23 mL/min — ABNORMAL LOW (ref >60)
Glucose, Bld: 107 mg/dL — ABNORMAL HIGH (ref 70–99)
Glucose, Bld: 163 mg/dL — ABNORMAL HIGH (ref 70–99)
Phosphorus: 3.2 mg/dL (ref 2.5–4.6)
Phosphorus: 3 mg/dL (ref 2.5–4.6)
Potassium: 3.6 mmol/L (ref 3.5–5.1)
Potassium: 3.8 mmol/L (ref 3.5–5.1)
Sodium: 137 mmol/L (ref 135–145)
Sodium: 136 mmol/L (ref 135–145)

## 2023-10-18 LAB — LACTIC ACID, PLASMA
Lactic Acid, Venous: 2 mmol/L (ref 0.5–1.9)
Lactic Acid, Venous: 3.6 mmol/L (ref 0.5–1.9)

## 2023-10-18 LAB — CBC
HCT: 28.6 % — ABNORMAL LOW (ref 36.0–46.0)
Hemoglobin: 9.7 g/dL — ABNORMAL LOW (ref 12.0–15.0)
MCH: 31.7 pg (ref 26.0–34.0)
MCHC: 33.9 g/dL (ref 30.0–36.0)
MCV: 93.5 fL (ref 80.0–100.0)
Platelets: 182 K/uL (ref 150–400)
RBC: 3.06 MIL/uL — ABNORMAL LOW (ref 3.87–5.11)
RDW: 13.7 % (ref 11.5–15.5)
WBC: 11.2 K/uL — ABNORMAL HIGH (ref 4.0–10.5)
nRBC: 1.2 % — ABNORMAL HIGH (ref 0.0–0.2)

## 2023-10-18 LAB — HEPARIN LEVEL (UNFRACTIONATED): Heparin Unfractionated: >1.1 [IU]/mL — ABNORMAL HIGH (ref 0.30–0.70)

## 2023-10-18 LAB — GLUCOSE, CAPILLARY
Glucose-Capillary: 124 mg/dL — ABNORMAL HIGH (ref 70–99)
Glucose-Capillary: 130 mg/dL — ABNORMAL HIGH (ref 70–99)
Glucose-Capillary: 131 mg/dL — ABNORMAL HIGH (ref 70–99)
Glucose-Capillary: 138 mg/dL — ABNORMAL HIGH (ref 70–99)
Glucose-Capillary: 140 mg/dL — ABNORMAL HIGH (ref 70–99)
Glucose-Capillary: 158 mg/dL — ABNORMAL HIGH (ref 70–99)
Glucose-Capillary: 97 mg/dL (ref 70–99)

## 2023-10-18 LAB — TROPONIN I (HIGH SENSITIVITY)
Troponin I (High Sensitivity): 175 ng/L (ref <18)
Troponin I (High Sensitivity): 196 ng/L (ref <18)

## 2023-10-18 LAB — MAGNESIUM: Magnesium: 2.1 mg/dL (ref 1.7–2.4)

## 2023-10-18 LAB — APTT
aPTT: >200 s (ref 24–36)
aPTT: 168 s (ref 24–36)

## 2023-10-18 LAB — HEPATITIS B SURFACE ANTIBODY, QUANTITATIVE: Hep B S AB Quant (Post): <3.5 m[IU]/mL — ABNORMAL LOW

## 2023-10-18 MED ORDER — RENA-VITE PO TABS
1.0000 | ORAL_TABLET | Freq: Every day | ORAL | Status: DC
  Administered 2023-10-19: 1 via ORAL
  Filled 2023-10-18: qty 1

## 2023-10-18 MED ORDER — TRAZODONE HCL 50 MG PO TABS
50.0000 mg | ORAL_TABLET | Freq: Every day | ORAL | Status: DC
  Administered 2023-10-19 – 2023-10-29 (×12): 50 mg via ORAL
  Filled 2023-10-18 (×12): qty 1

## 2023-10-18 MED ORDER — RENA-VITE PO TABS
1.0000 | ORAL_TABLET | Freq: Every day | ORAL | Status: DC
  Administered 2023-10-18: 1
  Filled 2023-10-18: qty 1

## 2023-10-18 MED ORDER — HEPARIN (PORCINE) 25000 UT/250ML-% IV SOLN: 450.0000 [IU]/h | INTRAVENOUS | Status: DC

## 2023-10-18 MED ORDER — ACETAMINOPHEN 325 MG PO TABS
650.0000 mg | ORAL_TABLET | Freq: Four times a day (QID) | ORAL | Status: DC | PRN
  Administered 2023-10-19 – 2023-10-25 (×5): 650 mg via ORAL
  Filled 2023-10-18 (×5): qty 2

## 2023-10-18 MED ORDER — ENSURE PLUS HIGH PROTEIN PO LIQD
237.0000 mL | Freq: Three times a day (TID) | ORAL | Status: DC
  Administered 2023-10-19: 237 mL via ORAL

## 2023-10-18 MED ORDER — VITAMIN C 500 MG PO TABS
500.0000 mg | ORAL_TABLET | Freq: Two times a day (BID) | ORAL | Status: DC
  Administered 2023-10-18 – 2023-10-20 (×4): 500 mg via ORAL
  Filled 2023-10-18 (×4): qty 1

## 2023-10-18 NOTE — Progress Notes (Signed)
 Nutrition Follow Up Note   DOCUMENTATION CODES:   Obesity unspecified  INTERVENTION:   Ensure Plus High Protein po TID with diet advancement, each supplement provides 350 kcal and 20 grams of protein  Rena-vit po daily with diet advancement   Vitamin C  500mg  po BID with diet advancement   Thiamine  100mg  IV daily   Pt at high refeed risk; recommend monitor potassium, magnesium  and phosphorus labs daily until stable  Daily weights   NUTRITION DIAGNOSIS:   Inadequate oral intake related to acute illness as evidenced by per patient/family report. -ongoing   GOAL:   Patient will meet greater than or equal to 90% of their needs -not met   MONITOR:   Diet advancement, Labs, Weight trends, I & O's, Skin  ASSESSMENT:   78 y.o female with h/o Afib on Eliquis , dual-chamber pacemaker for complete heart block, DM2, CKD III, HLD, CAD, HTN, ICH (2012), CVA, GERD, seizures, fibromyalgia, depression/anxiety, gout and OSA not on CPAP who is admitted with altered mental status, UTI, AKI and diarrhea.  Met with pt in room today; pt with improved mental status. Pt reports good appetite and oral intake at baseline. Pt reports that she is hungry today. RN to perform a BSE once appropriate. RD will add supplements and vitamins with diet advancement. RD discussed with pt the importance of adequate nutrition needed to preserve lean muscle. Pt is agreeable to drink chocolate Ensure. Pt is at high refeed risk. Pt continues on CRRT. Per chart, pt is down 3lbs since admission.     Medications reviewed and include: insulin , thiamine , ceftriaxone , pepcid , heparin , metronidazole , levophed , vancomycin , vasopressin    Labs reviewed: K 3.6 wnl, BUN 42(H), creat 3.23(H), P 3.2 wnl, Mg 2.1 wnl Lactic acid- 8.8(H) Wbc- 11.2(H), Hgb 9.7(L), Hct 28.6(L) Cbgs- 131, 130, 97 x 24 hrs  NUTRITION - FOCUSED PHYSICAL EXAM:  Flowsheet Row Most Recent Value  Orbital Region No depletion  Upper Arm Region Moderate  depletion  Thoracic and Lumbar Region No depletion  Buccal Region No depletion  Temple Region No depletion  Clavicle Bone Region Mild depletion  Clavicle and Acromion Bone Region Mild depletion  Scapular Bone Region Mild depletion  Dorsal Hand Mild depletion  Patellar Region No depletion  Anterior Thigh Region No depletion  Posterior Calf Region Mild depletion  Edema (RD Assessment) Mild  Hair Reviewed  Eyes Reviewed  Mouth Reviewed  Skin Reviewed  Nails Reviewed   Diet Order:   Diet Order     None      EDUCATION NEEDS:   Education needs have been addressed  Skin:  Skin Assessment: Reviewed RN Assessment (irritation sacrum, ecchymosis)  Last BM:  7/16- type 6  Height:   Ht Readings from Last 1 Encounters:  10/16/23 5' 3 (1.6 m)    Weight:   Wt Readings from Last 1 Encounters:  10/18/23 101.6 kg    Ideal Body Weight:  52.2 kg  BMI:  Body mass index is 39.68 kg/m.  Estimated Nutritional Needs:   Kcal:  2000-2300kcal/day  Protein:  100-115g/day  Fluid:  1.6-1.8L/day  Traci Shams MS, RD, LDN If unable to be reached, please send secure chat to RD inpatient available from 8:00a-4:00p daily

## 2023-10-18 NOTE — Consult Note (Signed)
 PHARMACY CONSULT NOTE - ELECTROLYTES  Pharmacy Consult for Electrolyte Monitoring and Replacement   Recent Labs: Height: 5' 3 (160 cm) Weight: 101.6 kg (223 lb 15.8 oz) IBW/kg (Calculated) : 52.4 Estimated Creatinine Clearance: 24.8 mL/min (A) (by C-G formula based on SCr of 2.2 mg/dL (H)). Potassium (mmol/L)  Date Value  10/18/2023 3.8  08/02/2012 3.6   Magnesium  (mg/dL)  Date Value  92/83/7974 2.1   Calcium  (mg/dL)  Date Value  92/83/7974 7.3 (L)   Calcium , Total (mg/dL)  Date Value  94/98/7985 9.3   Albumin  (g/dL)  Date Value  92/83/7974 2.9 (L)  07/21/2012 3.6   Phosphorus (mg/dL)  Date Value  92/83/7974 3.0   Sodium (mmol/L)  Date Value  10/18/2023 136  08/02/2012 138   Corrected Calcium : 8.2   (Ca 7.3  Albumin  2.9)  Assessment  Traci Carter is a 78 y.o. female presenting with altered mental status. PMH significant for paroxysmal atrial fibrillation, pacemaker for complete heart block, hypertension, hyperlipidemia, prior CVA, diabetes. Pharmacy has been consulted to monitor and replace electrolytes while under PCCM care. CRRT started 07/15  MIVF: none Pertinent medications: on 2 pressors, CRRT w/ 4K bath  Goal of Therapy: Electrolytes WNL  Plan:  No electrolyte replacement warranted at this time Renal function panel BID while on CRRT   Thank you for allowing pharmacy to be a part of this patient's care.  Suzann Allean LABOR, PharmD Clinical Pharmacist 10/18/2023 4:13 PM

## 2023-10-18 NOTE — Progress Notes (Signed)
 PHARMACY - ANTICOAGULATION CONSULT NOTE  Pharmacy Consult for heparin  infusion Indication: atrial fibrillation  Allergies  Allergen Reactions   Codeine Other (See Comments)    GI Distress GI Distress GI Distress   Codeine    Digoxin     Other reaction(s): Unknown   Lanoxin [Digoxin]    Penicillins    Zoloft [Sertraline Hcl]    Penicillins Rash and Other (See Comments)    Patient Measurements: Height: 5' 3 (160 cm) Weight: 103.4 kg (227 lb 15.3 oz) IBW/kg (Calculated) : 52.4 HEPARIN  DW (KG): 76.8  Vital Signs: Temp: 97.9 F (36.6 C) (07/16 0345) Temp Source: Axillary (07/16 0345) BP: 99/63 (07/16 0700) Pulse Rate: 62 (07/16 0700)  Labs: Recent Labs    10/16/23 1520 10/17/23 0441 10/17/23 1003 10/17/23 1105 10/17/23 1703 10/17/23 1934 10/17/23 2105 10/17/23 2351 10/18/23 0409  HGB 13.9 11.7*  --   --   --   --   --   --  9.7*  HCT 44.3 38.9  --   --   --   --   --   --  28.6*  PLT 301 261  --   --   --   --   --   --  182  APTT  --   --   --   --   --   --  >200*  --   --   LABPROT 17.3*  --   --   --   --   --   --   --   --   INR 1.3*  --   --   --   --   --   --   --   --   CREATININE 8.32* 8.36* 8.16*  --  5.74*  --   --   --  3.23*  CKTOTAL 230  --   --   --   --   --   --   --   --   TROPONINIHS 53*  --   --    < > 106* 123*  --  175* 196*   < > = values in this interval not displayed.    Estimated Creatinine Clearance: 17 mL/min (A) (by C-G formula based on SCr of 3.23 mg/dL (H)).   Medical History: Past Medical History:  Diagnosis Date   Afib (HCC)    Atrial fibrillation (HCC)    Diabetes mellitus without complication (HCC)    DM (diabetes mellitus) (HCC)    Dysrhythmia    HTN (hypertension)    Hypertension    Stroke (HCC) 07/21/12   posterior superior left frontal lobe parenchymal hemorrhage     Medications:  Scheduled:   atorvastatin   20 mg Oral Daily   Chlorhexidine  Gluconate Cloth  6 each Topical Daily   insulin  aspart  0-9  Units Subcutaneous Q4H   levETIRAcetam   1,000 mg Intravenous Q12H   thiamine  (VITAMIN B1) injection  100 mg Intravenous QHS   Infusions:   cefTRIAXone  (ROCEPHIN )  IV Stopped (10/17/23 2336)   famotidine  (PEPCID ) IV Stopped (10/17/23 1022)   heparin  950 Units/hr (10/18/23 0700)   metronidazole  Stopped (10/18/23 0600)   norepinephrine  (LEVOPHED ) Adult infusion 9 mcg/min (10/18/23 0700)   prismasol  BGK 4/2.5 400 mL/hr at 10/17/23 1944   prismasol  BGK 4/2.5 400 mL/hr at 10/17/23 1943   prismasol  BGK 4/2.5 1,500 mL/hr at 10/18/23 0505   vancomycin      vasopressin  0.03 Units/min (10/18/23 0700)    Assessment: 78 y.o. female  presenting with altered mental status. PMH significant for paroxysmal atrial fibrillation, pacemaker for complete heart block, hypertension, hyperlipidemia, prior CVA, diabetes. She takes apixaban  PTA with last dose 07/15 0155  Baseline Labs: Hgb 11.7, PLT wnl, INR 1.3  Goal of Therapy:  anti-Xa level 0.3-0.7 units/ml aPTT 66 - 102 seconds Monitor platelets by anticoagulation protocol: Yes   Plan: aPTT supratherapeutic ---hold heparin  infusion x 1 hour (d/w RN) ---reduce heparin  infusion to 650 units/hr  ---Check aPTT in 8 hours after rate change (we will use aPTT to guide therapy until aPTT and anti-Xa level correlate) ---Continue to monitor H&H and platelets  Traci Carter 10/18/2023,7:11 AM

## 2023-10-18 NOTE — Consult Note (Signed)
 PHARMACY CONSULT NOTE - ELECTROLYTES  Pharmacy Consult for Electrolyte Monitoring and Replacement   Recent Labs: Height: 5' 3 (160 cm) Weight: 103.4 kg (227 lb 15.3 oz) IBW/kg (Calculated) : 52.4 Estimated Creatinine Clearance: 17 mL/min (A) (by C-G formula based on SCr of 3.23 mg/dL (H)). Potassium (mmol/L)  Date Value  10/18/2023 3.6  08/02/2012 3.6   Magnesium  (mg/dL)  Date Value  92/83/7974 2.1   Calcium  (mg/dL)  Date Value  92/83/7974 6.9 (L)   Calcium , Total (mg/dL)  Date Value  94/98/7985 9.3   Albumin  (g/dL)  Date Value  92/83/7974 2.7 (L)  07/21/2012 3.6   Phosphorus (mg/dL)  Date Value  92/83/7974 3.2   Sodium (mmol/L)  Date Value  10/18/2023 137  08/02/2012 138    Assessment  Traci Carter is a 78 y.o. female presenting with altered mental status. PMH significant for paroxysmal atrial fibrillation, pacemaker for complete heart block, hypertension, hyperlipidemia, prior CVA, diabetes. Pharmacy has been consulted to monitor and replace electrolytes while under PCCM care. CRRT started 07/15  MIVF: sodium bicarbonate  150 mEq in sterile water  @ 100 mL/hr Pertinent medications: sodium zirconium cyclosilicate  10 grams po BID  Goal of Therapy: Electrolytes WNL  Plan:  No electrolyte replacement warranted at this time Renal function panel BID while on CRRT   Thank you for allowing pharmacy to be a part of this patient's care.  [name], PharmD Clinical Pharmacist 10/18/2023 7:12 AM

## 2023-10-18 NOTE — Progress Notes (Signed)
 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 Coli and Enterococcus faecalis UTI who presented to the ED with chief complaints of altered mental status.   Per the ED report, the patient's husband is currently admitted to the hospital and was transitioned to comfort care on 10/15/23. The patient's daughter states the patient has continued to decline and has not been eating, drinking, or taking her medications. Her condition worsened over the past two days, with nausea, vomiting, and diarrhea. The patient was noted with worsening confusion and generalized weakness today, which prompted further evaluation in the ED.   ED Course: Initial vital signs showed HR of 68 beats/minute, BP 109/50 mm Hg, the RR 18 breaths/minute, and the oxygen saturation 95 % on RA and a temperature of 97.70F (36.4C). 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 Coli and Enterococcus faecalis UTI  Significant Hospital Events   7/14: Admit to ICU with altered mental status found to be septic due to UTI and probable hypovolemia iso dehydration 10/17/23- patient continued to have worsening renal failure and mental status. Reviewed with  nephrology, started on CRRT today.  VBG with severe acidosis improved post treatment.  Remains severely critically ill.  10/18/23- patient improved, weaning down on vasopressors, renal function improved overnight, +encephalopathy but able to communicate with delayed response.  Remains critically ill.  MRSA neg dc vanco.  Consults:  None  Procedures:  None  Interim History / Subjective:      Micro Data:  7/14: Blood culture x2> 7/14: Urine Culture> 7/14: MRSA PCR>>   Antimicrobials:  Ceftriaxone  7/14 Metronidazole  7/14  OBJECTIVE  Blood pressure (!) 103/57, pulse (!) 59, temperature 97.9 F (36.6 C), temperature source Axillary, resp. rate 10, height 5' 3 (1.6 m), weight 101.6 kg, SpO2 98%.        Intake/Output Summary (Last 24 hours) at 10/18/2023 0902 Last data filed at 10/18/2023 0800 Gross per 24 hour  Intake 3864.85 ml  Output 3710 ml  Net 154.85 ml   Filed Weights   10/17/23 0700 10/18/23 0400 10/18/23 0900  Weight: 103.1 kg 103.4 kg 101.6 kg    Physical Examination  GENERAL: 78 year-old critically ill patient lying in the bed  EYES: PEERLA. No scleral icterus. Extraocular muscles intact.  HEENT: Head atraumatic, normocephalic. Oropharynx and nasopharynx clear.  CARDIOVASCULAR:S1, S2 normal. No murmurs, rubs, or gallops.   PULMONARY: Lungs course with wheezes ABDOMINAL: Soft, NTND MUSCULOSKELETAL: No swelling. Normal range of motion. 4/5 strength in all extremities.   NEUROLOGICAL: General: No focal deficit present. She is alert and oriented to person only.  PSYCHIATRIC:  Mood and Affect: Mood normal.  SKIN:Skin is warm and dry. No obvious rash, lesion, or ulcer. Capillary Refill:< 2sec  Labs/imaging that I havepersonally reviewed  (right click and Reselect all SmartList Selections  daily)  US  RENAL Result Date: 10/17/2023 CLINICAL DATA:  Acute renal failure. EXAM: RENAL / URINARY TRACT ULTRASOUND COMPLETE COMPARISON:  November 11, 2004 FINDINGS: Right Kidney:  Renal measurements: 9.0 cm x 4.6 cm x 4.8 cm = volume: 103.2 mL. Echogenicity within normal limits. No mass or hydronephrosis visualized. Left Kidney: Renal measurements: 9.4 cm x 4.4 cm x 4.6 cm = volume: 97.6 mL. Mildly increased echogenicity of the renal parenchyma is seen. No mass or hydronephrosis visualized. Bladder: A Foley catheter is in place. Other: None. IMPRESSION: Mildly echogenic left kidney which may represent a normal variant versus sequelae associated with medical renal disease. Electronically Signed   By: Suzen Dials M.D.   On: 10/17/2023 15:52   DG Chest Port 1 View Result Date: 10/17/2023 CLINICAL DATA:  Central line placement EXAM: PORTABLE CHEST 1 VIEW COMPARISON:  Same day chest radiograph and prior studies FINDINGS: Interval placement of right IJ approach central venous catheter with catheter in place with tip at the cavoatrial junction/upper right atrium. Unchanged left IJ approach central venous catheter. Unchanged cardiac pacemaker. Low lung volumes. No pleural effusions. No pneumothorax. Unchanged cardiomediastinal silhouette. IMPRESSION: Interval placement of right IJ approach central venous catheter with tip at the cavoatrial junction/upper right atrium. Unchanged left IJ approach central venous catheter. No pneumothorax. Electronically Signed   By: Michaeline Blanch M.D.   On: 10/17/2023 12:37   DG Chest Port 1 View Result Date: 10/17/2023 CLINICAL DATA:  Central line placement EXAM: PORTABLE CHEST 1 VIEW COMPARISON:  Yesterday FINDINGS: Placement of a left IJ line with tip at low SVC. Patient rotated left. Midline trachea. Mild cardiomegaly. Atherosclerosis in the transverse aorta. No pleural effusion or pneumothorax. No congestive failure. Clear lungs. IMPRESSION: Left IJ line tip at low SVC, without pneumothorax. Cardiomegaly without congestive failure. Aortic Atherosclerosis (ICD10-I70.0). Electronically Signed   By: Rockey Kilts M.D.   On: 10/17/2023 08:48   CT Head Wo  Contrast Result Date: 10/16/2023 CLINICAL DATA:  Mental status change, unknown cause EXAM: CT HEAD WITHOUT CONTRAST TECHNIQUE: Contiguous axial images were obtained from the base of the skull through the vertex without intravenous contrast. RADIATION DOSE REDUCTION: This exam was performed according to the departmental dose-optimization program which includes automated exposure control, adjustment of the mA and/or kV according to patient size and/or use of iterative reconstruction technique. COMPARISON:  CT head 09/16/2020 FINDINGS: Brain: Patchy and confluent areas of decreased attenuation are noted throughout the deep and periventricular white matter of the cerebral hemispheres bilaterally, compatible with chronic microvascular ischemic disease. No evidence of large-territorial acute infarction. No parenchymal hemorrhage. No mass lesion. No extra-axial collection. No mass effect or midline shift. No hydrocephalus. Basilar cisterns are patent. Vascular: No hyperdense vessel. Atherosclerotic calcifications are present within the cavernous internal carotid arteries. Skull: No acute fracture or focal lesion. Sinuses/Orbits: Paranasal sinuses and mastoid air cells are clear. The orbits are unremarkable. Other: None. IMPRESSION: No acute intracranial abnormality. Electronically Signed   By: Morgane  Naveau M.D.   On: 10/16/2023 18:56   CT ABDOMEN PELVIS WO CONTRAST Result Date: 10/16/2023 CLINICAL DATA:  LLQ abdominal pain also with vaginal bleeding EXAM: CT ABDOMEN AND PELVIS WITHOUT CONTRAST TECHNIQUE: Multidetector CT imaging of the abdomen and pelvis was performed following the standard protocol without IV contrast. RADIATION DOSE REDUCTION: This exam was performed according to the departmental dose-optimization program which includes automated exposure control, adjustment of the mA and/or kV according to patient size and/or use of iterative reconstruction technique. COMPARISON:  None Available.  FINDINGS: Lower  chest: Bilateral lower lobe linear atelectasis versus scarring. Coronary artery calcification. Cardiac leads partially visualized. Hepatobiliary: No focal liver abnormality. Status post cholecystectomy. No biliary dilatation. Pancreas: Diffusely atrophic. No focal lesion. Otherwise normal pancreatic contour. No surrounding inflammatory changes. No main pancreatic ductal dilatation. Spleen: Normal in size without focal abnormality. Adrenals/Urinary Tract: No adrenal nodule bilaterally. Punctate left renal calcification. No hydronephrosis. No definite contour-deforming renal mass. No ureterolithiasis or hydroureter. The urinary bladder is unremarkable. Stomach/Bowel: Stomach is within normal limits. No evidence of bowel wall thickening or dilatation. Slightly hypertrophic ileocecal valve. Appendix appears normal. Vascular/Lymphatic: No abdominal aorta or iliac aneurysm. Severe atherosclerotic plaque of the aorta and its branches. No abdominal, pelvic, or inguinal lymphadenopathy. Reproductive: Status post hysterectomy. No adnexal masses. Other: No intraperitoneal free fluid. No intraperitoneal free gas. No organized fluid collection. Musculoskeletal: No abdominal wall hernia or abnormality. No suspicious lytic or blastic osseous lesions. No acute displaced fracture. Multilevel degenerative changes of the spine. IMPRESSION: 1. No acute intra-abdominal or intrapelvic abnormality with limited evaluation on this noncontrast study. 2. Punctate nonobstructive left nephrolithiasis. 3. Status post cholecystectomy and hysterectomy. 4.  Aortic Atherosclerosis (ICD10-I70.0). Electronically Signed   By: Morgane  Naveau M.D.   On: 10/16/2023 18:54   DG Chest Portable 1 View Result Date: 10/16/2023 CLINICAL DATA:  Chest pain. EXAM: PORTABLE CHEST 1 VIEW COMPARISON:  February 09, 2021. FINDINGS: Stable cardiomediastinal silhouette. Left-sided pacemaker is unchanged. Both lungs are clear. The visualized skeletal structures are  unremarkable. IMPRESSION: No active disease. Electronically Signed   By: Lynwood Landy Raddle M.D.   On: 10/16/2023 15:25    Labs   CBC: Recent Labs  Lab 10/16/23 1520 10/17/23 0441 10/18/23 0409  WBC 11.8* 13.8* 11.2*  HGB 13.9 11.7* 9.7*  HCT 44.3 38.9 28.6*  MCV 100.0 102.6* 93.5  PLT 301 261 182    Basic Metabolic Panel: Recent Labs  Lab 10/16/23 1520 10/17/23 0441 10/17/23 1003 10/17/23 1703 10/18/23 0409  NA 142 136 137 137 137  K 5.7* 6.2* 4.9 3.6 3.6  CL 106 105 103 99 102  CO2 10* 7* 10* 13* 23  GLUCOSE 28* 181* 211* 104* 107*  BUN 91* 93* 96* 69* 42*  CREATININE 8.32* 8.36* 8.16* 5.74* 3.23*  CALCIUM  8.9 7.9* 7.9* 7.6* 6.9*  MG 1.5* 2.0  --   --  2.1  PHOS  --  9.5*  --  5.4* 3.2   GFR: Estimated Creatinine Clearance: 16.9 mL/min (A) (by C-G formula based on SCr of 3.23 mg/dL (H)). Recent Labs  Lab 10/16/23 1520 10/17/23 0441 10/17/23 1003 10/17/23 1105 10/17/23 2105 10/17/23 2351 10/18/23 0409  WBC 11.8* 13.8*  --   --   --   --  11.2*  LATICACIDVEN  --   --    < > 8.8* 5.2* 3.6* 2.0*   < > = values in this interval not displayed.   Liver Function Tests: Recent Labs  Lab 10/16/23 1520 10/17/23 0441 10/17/23 1703 10/18/23 0409  AST 32 25  --   --   ALT 17 15  --   --   ALKPHOS 83 76  --   --   BILITOT 0.7 0.8  --   --   PROT 7.0 5.9*  --   --   ALBUMIN  3.4* 2.9* 3.1* 2.7*   No results for input(s): LIPASE, AMYLASE in the last 168 hours. No results for input(s): AMMONIA in the last 168 hours.  ABG    Component Value  Date/Time   PHART 7.03 (LL) 10/17/2023 0746   PCO2ART 25 (L) 10/17/2023 0746   PO2ART 98 10/17/2023 0746   HCO3 22.7 10/18/2023 0409   ACIDBASEDEF 3.5 (H) 10/18/2023 0409   O2SAT 74.8 10/18/2023 0409    Coagulation Profile: Recent Labs  Lab 10/16/23 1520  INR 1.3*   Cardiac Enzymes: Recent Labs  Lab 10/16/23 1520  CKTOTAL 230   HbA1C: Hgb A1c MFr Bld  Date/Time Value Ref Range Status  10/17/2023 04:41 AM  5.7 (H) 4.8 - 5.6 % Final    Comment:    (NOTE)         Prediabetes: 5.7 - 6.4         Diabetes: >6.4         Glycemic control for adults with diabetes: <7.0   01/17/2021 04:43 AM 11.5 (H) 4.8 - 5.6 % Final    Comment:    (NOTE)         Prediabetes: 5.7 - 6.4         Diabetes: >6.4         Glycemic control for adults with diabetes: <7.0    CBG: Recent Labs  Lab 10/17/23 1514 10/17/23 1952 10/17/23 2356 10/18/23 0414 10/18/23 0746  GLUCAP 97 85 124* 97 130*   Review of Systems:   Unable to be obtained secondary to the patient's altered mental status.   Past Medical History  She,  has a past medical history of Afib (HCC), Atrial fibrillation (HCC), Diabetes mellitus without complication (HCC), DM (diabetes mellitus) (HCC), Dysrhythmia, HTN (hypertension), Hypertension, and Stroke (HCC) (07/21/12).   Surgical History    Past Surgical History:  Procedure Laterality Date   ABDOMINAL HYSTERECTOMY     CARPAL TUNNEL RELEASE     COLONOSCOPY  01/02/2009   Dr Dessa diverticulosis.   COLONOSCOPY WITH PROPOFOL  N/A 06/28/2017   Procedure: COLONOSCOPY WITH PROPOFOL ;  Surgeon: Dessa Reyes ORN, MD;  Location: [facility] ENDOSCOPY;  Service: Endoscopy;  Laterality: N/A;   ELBOW SURGERY     INSERT / REPLACE / REMOVE PACEMAKER     Dr Ammon   JOINT REPLACEMENT  2005   knee   SHOULDER ARTHROSCOPY       Social History   reports that she has never smoked. She has never used smokeless tobacco. She reports that she does not drink alcohol and does not use drugs.   Family History   Her family history includes Breast cancer in her mother; Prostate cancer in her father.   Allergies Allergies  Allergen Reactions   Codeine Other (See Comments)    GI Distress GI Distress GI Distress   Codeine    Digoxin     Other reaction(s): Unknown   Lanoxin [Digoxin]    Penicillins    Zoloft [Sertraline Hcl]    Penicillins Rash and Other (See Comments)     Home Medications  Prior to  Admission medications   Medication Sig Start Date End Date Taking? Authorizing Provider  acetaminophen  (TYLENOL ) 325 MG tablet Take 2 tablets (650 mg total) by mouth every 6 (six) hours as needed for mild pain, headache, fever or moderate pain. 09/21/20   Elgergawy, Brayton RAMAN, MD  albuterol  (PROVENTIL ) (2.5 MG/3ML) 0.083% nebulizer solution Inhale 3 mLs into the lungs every 4 (four) hours as needed for wheezing or shortness of breath. 09/21/20   Elgergawy, Brayton RAMAN, MD  albuterol  (VENTOLIN  HFA) 108 (90 Base) MCG/ACT inhaler Inhale 2 puffs into the lungs every 6 (six) hours as needed for wheezing  or shortness of breath. 08/12/20   [provider]  allopurinol  (ZYLOPRIM ) 100 MG tablet Take 100 mg by mouth daily. 11/28/20   [provider]  atorvastatin  (LIPITOR) 20 MG tablet Take 20 mg by mouth daily.    [provider]  colchicine  0.6 MG tablet Take 1 tablet (0.6 mg total) by mouth daily. 02/11/21   Jens Durand, MD  ELIQUIS  5 MG TABS tablet Take 5 mg by mouth 2 (two) times daily. 01/07/21   [provider]  famotidine  (PEPCID ) 20 MG tablet Take 20 mg by mouth 2 (two) times daily.    [provider]  furosemide  (LASIX ) 20 MG tablet Take 1 tablet (20 mg total) by mouth daily as needed (weight gain >3 lbs in 1 day and >5 lbs in 2 days). 10/20/20   Tobie Yetta HERO, MD  glipiZIDE  (GLUCOTROL  XL) 5 MG 24 hr tablet Take 5 mg by mouth daily. 01/04/21   [provider]  insulin  aspart (NOVOLOG ) 100 UNIT/ML injection Inject 0-9 Units into the skin 3 (three) times daily with meals. 09/21/20   Elgergawy, Brayton RAMAN, MD  levETIRAcetam  (KEPPRA ) 1000 MG tablet Take 1,000 mg by mouth 2 (two) times daily. 01/08/21   [provider]  lisinopril  (ZESTRIL ) 5 MG tablet Take 5 mg by mouth daily. 11/07/20   [provider]  metFORMIN  (GLUCOPHAGE ) 1000 MG tablet Take 1,000 mg by mouth 2 (two) times daily. 11/28/20   [provider]  polyethylene glycol  (MIRALAX  / GLYCOLAX ) 17 g packet Take 17 g by mouth 2 (two) times daily. 10/20/20   Patel, Pranav M, MD  Scheduled Meds:  atorvastatin   20 mg Oral Daily   Chlorhexidine  Gluconate Cloth  6 each Topical Daily   insulin  aspart  0-9 Units Subcutaneous Q4H   levETIRAcetam   1,000 mg Intravenous Q12H   thiamine  (VITAMIN B1) injection  100 mg Intravenous QHS   Continuous Infusions:  cefTRIAXone  (ROCEPHIN )  IV Stopped (10/17/23 2336)   famotidine  (PEPCID ) IV Stopped (10/17/23 1022)   heparin  950 Units/hr (10/18/23 0700)   metronidazole  Stopped (10/18/23 0600)   norepinephrine  (LEVOPHED ) Adult infusion 9 mcg/min (10/18/23 0700)   prismasol  BGK 4/2.5 400 mL/hr (10/18/23 0837)   prismasol  BGK 4/2.5 400 mL/hr (10/18/23 0837)   prismasol  BGK 4/2.5 1,500 mL/hr (10/18/23 0837)   vancomycin      vasopressin  0.03 Units/min (10/18/23 0700)   PRN Meds:.heparin   Active Hospital Problem list   See systems below  Assessment & Plan:  #Septic shock - PRESENT ON ADMISSION - due to UTI and probable intrabdominal source (nv and diarrhea) Hx of E. Coli and Enterococcus faecalis UTI  Presenting with altered mental status, UA positive for UTI -F/u cultures, trend lactic/ PCT -Monitor WBC/ fever curve -Continue IV abx pending cultures -GI panel pending -IVF hydration as needed -Pressors for MAP goal >65 -Strict I/O's    #AKI on CKD stage III: AKI likely iso above #AGMA #Hyperkalemia #Hypomagnesium -Monitor I&O's / urinary output -Follow BMP -Ensure adequate renal perfusion -Avoid nephrotoxic agents as able -Replace electrolytes as indicated  #Paroxysmal Afib  Dual-chamber pacemaker for complete heart block #HTN:  #HLD:  -Hold home lisinopril .  -Continue Eliquis  -Continued home statin -Replete K<4, Mg<2   #Hx of seizures -Continued home keppra , will switch to IV while npo -PRN Lorazepam  for breakthrough seizure -seizure precaution  #Anxiety and Depression -No home meds    #T2DM #Peripheral neuropathy: In s/o diabetes history. Unclear if still taking lyrica  or Cymbalta   Presenting with hypoglycemia  23 -CBG's q2 -Target range of 140 to 180, avoid hypoglycemia -Hold SSI and Lantus  until stable -Follow ICU Hypo/Hyperglycemia protocol -Hold home Glipizide  and Metformin   #Chronic Gout   #FTT #Falls Risk She uses a walker at baseline and will use a wheelchair which she goes to the doctor's office. Recent multiple falls with decreased po intake -PT/OT -TOC consult for SNF placement consideration -Dietary consult -Palliative consult  #OSA not on CPAP Remote hx of Asthma/COPD -Duonebs scheduled and PRN  Best practice:  Diet:  Oral Pain/Anxiety/Delirium protocol (if indicated): No VAP protocol (if indicated): Not indicated DVT prophylaxis: Systemic AC GI prophylaxis: H2B Glucose control:  SSI Yes Central venous access:  N/A Arterial line:  N/A Foley:  N/A Mobility:  bed rest  PT consulted: Yes Last date of multidisciplinary goals of care discussion []  Code Status:  full code Disposition: ICU   = Goals of Care = Code Status Order: FULL  Primary Emergency Contact: Williams,Christy Wishes to pursue full aggressive treatment and intervention options, including CPR and intubation, but goals of care will be addressed on going with family if that should become necessary.  Critical care provider statement:   Total critical care time: 33 minutes   Performed by: Parris MD   Critical care time was exclusive of separately billable procedures and treating other patients.   Critical care was necessary to treat or prevent imminent or life-threatening deterioration.   Critical care was time spent personally by me on the following activities: development of treatment plan with patient and/or surrogate as well as nursing, discussions with consultants, evaluation of patient's response to treatment, examination of patient, obtaining history from patient  or surrogate, ordering and performing treatments and interventions, ordering and review of laboratory studies, ordering and review of radiographic studies, pulse oximetry and re-evaluation of patient's condition.    Jakwan Sally, M.D.  Pulmonary & Critical Care Medicine

## 2023-10-18 NOTE — Progress Notes (Signed)
 PHARMACY - ANTICOAGULATION CONSULT NOTE  Pharmacy Consult for heparin  infusion Indication: atrial fibrillation  Allergies  Allergen Reactions   Codeine Other (See Comments)    GI Distress GI Distress GI Distress   Codeine    Digoxin     Other reaction(s): Unknown   Lanoxin [Digoxin]    Penicillins    Zoloft [Sertraline Hcl]    Penicillins Rash and Other (See Comments)    Patient Measurements: Height: 5' 3 (160 cm) Weight: 101.6 kg (223 lb 15.8 oz) IBW/kg (Calculated) : 52.4 HEPARIN  DW (KG): 76.8  Vital Signs: Temp: 97.9 F (36.6 C) (07/16 2000) Temp Source: Axillary (07/16 2000) BP: 100/53 (07/16 2215) Pulse Rate: 59 (07/16 2215)  Labs: Recent Labs    10/16/23 1520 10/17/23 0441 10/17/23 1003 10/17/23 1703 10/17/23 1934 10/17/23 2105 10/17/23 2351 10/18/23 0409 10/18/23 0738 10/18/23 1532 10/18/23 2151  HGB 13.9 11.7*  --   --   --   --   --  9.7*  --   --   --   HCT 44.3 38.9  --   --   --   --   --  28.6*  --   --   --   PLT 301 261  --   --   --   --   --  182  --   --   --   APTT  --   --   --   --   --  >200*  --   --  >200*  --  168*  LABPROT 17.3*  --   --   --   --   --   --   --   --   --   --   INR 1.3*  --   --   --   --   --   --   --   --   --   --   HEPARINUNFRC  --   --   --   --   --   --   --   --  >1.10*  --   --   CREATININE 8.32* 8.36*   < > 5.74*  --   --   --  3.23*  --  2.20*  --   CKTOTAL 230  --   --   --   --   --   --   --   --   --   --   TROPONINIHS 53*  --    < > 106* 123*  --  175* 196*  --   --   --    < > = values in this interval not displayed.    Estimated Creatinine Clearance: 24.8 mL/min (A) (by C-G formula based on SCr of 2.2 mg/dL (H)).   Medical History: Past Medical History:  Diagnosis Date   Afib (HCC)    Atrial fibrillation (HCC)    Diabetes mellitus without complication (HCC)    DM (diabetes mellitus) (HCC)    Dysrhythmia    HTN (hypertension)    Hypertension    Stroke (HCC) 07/21/12   posterior  superior left frontal lobe parenchymal hemorrhage     Medications:  Scheduled:   vitamin C   500 mg Oral BID   atorvastatin   20 mg Oral Daily   Chlorhexidine  Gluconate Cloth  6 each Topical Daily   feeding supplement  237 mL Oral TID BM   insulin  aspart  0-9 Units Subcutaneous Q4H   levETIRAcetam   1,000 mg Intravenous Q12H   [START ON 10/19/2023] multivitamin  1 tablet Oral QHS   thiamine  (VITAMIN B1) injection  100 mg Intravenous QHS   Infusions:   cefTRIAXone  (ROCEPHIN )  IV Stopped (10/17/23 2336)   famotidine  (PEPCID ) IV Stopped (10/18/23 1202)   heparin      metronidazole  Stopped (10/18/23 1733)   norepinephrine  (LEVOPHED ) Adult infusion 3 mcg/min (10/18/23 2200)   prismasol  BGK 4/2.5 400 mL/hr at 10/18/23 2123   prismasol  BGK 4/2.5 400 mL/hr at 10/18/23 2122   prismasol  BGK 4/2.5 1,500 mL/hr at 10/18/23 2207   vasopressin  0.03 Units/min (10/18/23 2200)    Assessment: 78 y.o. female presenting with altered mental status. PMH significant for paroxysmal atrial fibrillation, pacemaker for complete heart block, hypertension, hyperlipidemia, prior CVA, diabetes. She takes apixaban  PTA with last dose 07/15 0155  Baseline Labs: Hgb 11.7, PLT wnl, INR 1.3  Goal of Therapy:  anti-Xa level 0.3-0.7 units/ml aPTT 66 - 102 seconds Monitor platelets by anticoagulation protocol: Yes   Plan:  7/16:  aPTT @ 2151 = 168, elevated  - will hold heparin  drip for 1 hr and restart at 450 units/hr ---Check aPTT and HL 8 hours after restart (we will use aPTT to guide therapy until aPTT and anti-Xa level correlate) ---Continue to monitor H&H and platelets  Lenus Trauger D 10/18/2023,10:31 PM

## 2023-10-18 NOTE — Progress Notes (Signed)
 [facility]  ROUNDING NOTE   Subjective:   Patient seen laying in bed Alert and oriented Pressors: Levo and Vaso Heparin  Gtt CRRT- net even  UOP 34ml  Objective:  Vital signs in last 24 hours:  Temp:  [97.8 F (36.6 C)-98.9 F (37.2 C)] 97.8 F (36.6 C) (07/16 1200) Pulse Rate:  [58-109] 59 (07/16 1430) Resp:  [8-23] 11 (07/16 1430) BP: (79-132)/(42-86) 115/53 (07/16 1430) SpO2:  [91 %-100 %] 100 % (07/16 1430) Weight:  [101.6 kg-103.4 kg] 101.6 kg (07/16 0900)  Weight change: 0.3 kg Filed Weights   10/17/23 0700 10/18/23 0400 10/18/23 0900  Weight: 103.1 kg 103.4 kg 101.6 kg    Intake/Output: I/O last 3 completed shifts: In: 7050.7 [I.V.:4007.5; Other:40; IV Piggyback:3003.2] Out: 3665 [Urine:34]   Intake/Output this shift:  Total I/O In: -  Out: 415 [Urine:15]  Physical Exam: General: NAD  Head: Normocephalic, atraumatic. Moist oral mucosal membranes  Eyes: Anicteric  Neck: Supple  Lungs:  Clear to auscultation  Heart: Regular rate and rhythm  Abdomen:  Soft, nontender  Extremities:  No peripheral edema.  Neurologic: Awake, alert, conversant  Skin: Warm,dry, no rash  Access: Rt internal jugular temp HD cath    Basic Metabolic Panel: Recent Labs  Lab 10/16/23 1520 10/17/23 0441 10/17/23 1003 10/17/23 1703 10/18/23 0409  NA 142 136 137 137 137  K 5.7* 6.2* 4.9 3.6 3.6  CL 106 105 103 99 102  CO2 10* 7* 10* 13* 23  GLUCOSE 28* 181* 211* 104* 107*  BUN 91* 93* 96* 69* 42*  CREATININE 8.32* 8.36* 8.16* 5.74* 3.23*  CALCIUM  8.9 7.9* 7.9* 7.6* 6.9*  MG 1.5* 2.0  --   --  2.1  PHOS  --  9.5*  --  5.4* 3.2    Liver Function Tests: Recent Labs  Lab 10/16/23 1520 10/17/23 0441 10/17/23 1703 10/18/23 0409  AST 32 25  --   --   ALT 17 15  --   --   ALKPHOS 83 76  --   --   BILITOT 0.7 0.8  --   --   PROT 7.0 5.9*  --   --   ALBUMIN  3.4* 2.9* 3.1* 2.7*   No results for input(s): LIPASE, AMYLASE in the last 168 hours. No  results for input(s): AMMONIA in the last 168 hours.  CBC: Recent Labs  Lab 10/16/23 1520 10/17/23 0441 10/18/23 0409  WBC 11.8* 13.8* 11.2*  HGB 13.9 11.7* 9.7*  HCT 44.3 38.9 28.6*  MCV 100.0 102.6* 93.5  PLT 301 261 182    Cardiac Enzymes: Recent Labs  Lab 10/16/23 1520  CKTOTAL 230    BNP: Invalid input(s): POCBNP  CBG: Recent Labs  Lab 10/17/23 1952 10/17/23 2356 10/18/23 0414 10/18/23 0746 10/18/23 1139  GLUCAP 85 124* 97 130* 131*    Microbiology: Results for orders placed or performed during the hospital encounter of 10/16/23  Urine Culture (for pregnant, neutropenic or urologic patients or patients with an indwelling urinary catheter)     Status: Abnormal (Preliminary result)   Collection Time: 10/16/23 10:04 PM   Specimen: Urine, Random  Result Value Ref Range Status   Specimen Description   Final    URINE, RANDOM Performed at [facility], [address] [place], [place] [address]    Special Requests   Final    NONE Performed at [facility], [address] [place], [place] [address]    Culture (A)  Final  80,000 COLONIES/mL ESCHERICHIA COLI SUSCEPTIBILITIES TO FOLLOW Performed at [facility], [address], [place], [place] [address]    Report Status PENDING  Incomplete  MRSA Next Gen by PCR, Nasal     Status: None   Collection Time: 10/16/23 11:05 PM   Specimen: Nasal Mucosa; Nasal Swab  Result Value Ref Range Status   MRSA by PCR Next Gen NOT DETECTED NOT DETECTED Final    Comment: (NOTE) The GeneXpert MRSA Assay (FDA approved for NASAL specimens only), is one component of a comprehensive MRSA colonization surveillance program. It is not intended to diagnose MRSA infection nor to guide or monitor treatment for MRSA infections. Test performance is not FDA approved in patients less than 38 years old. Performed at [facility], [address] [place], [place] [address]   Resp panel by  RT-PCR (RSV, Flu A&B, Covid) Anterior Nasal Swab     Status: None   Collection Time: 10/17/23 10:03 AM   Specimen: Anterior Nasal Swab  Result Value Ref Range Status   SARS Coronavirus 2 by RT PCR NEGATIVE NEGATIVE Final    Comment: (NOTE) SARS-CoV-2 target nucleic acids are NOT DETECTED.  The SARS-CoV-2 RNA is generally detectable in upper respiratory specimens during the acute phase of infection. The lowest concentration of SARS-CoV-2 viral copies this assay can detect is 138 copies/mL. A negative result does not preclude SARS-Cov-2 infection and should not be used as the sole basis for treatment or other patient management decisions. A negative result may occur with  improper specimen collection/handling, submission of specimen other than nasopharyngeal swab, presence of viral mutation(s) within the areas targeted by this assay, and inadequate number of viral copies(<138 copies/mL). A negative result must be combined with clinical observations, patient history, and epidemiological information. The expected result is Negative.  Fact Sheet for Patients:  BloggerCourse.com  Fact Sheet for Healthcare Providers:  SeriousBroker.it  This test is no t yet approved or cleared by the [place]  FDA and  has been authorized for detection and/or diagnosis of SARS-CoV-2 by FDA under an Emergency Use Authorization (EUA). This EUA will remain  in effect (meaning this test can be used) for the duration of the COVID-19 declaration under Section 564(b)(1) of the Act, 21 U.S.C.section 360bbb-3(b)(1), unless the authorization is terminated  or revoked sooner.       Influenza A by PCR NEGATIVE NEGATIVE Final   Influenza B by PCR NEGATIVE NEGATIVE Final    Comment: (NOTE) The Xpert Xpress SARS-CoV-2/FLU/RSV plus assay is intended as an aid in the diagnosis of influenza from Nasopharyngeal swab specimens and should not be used as a sole basis  for treatment. Nasal washings and aspirates are unacceptable for Xpert Xpress SARS-CoV-2/FLU/RSV testing.  Fact Sheet for Patients: BloggerCourse.com  Fact Sheet for Healthcare Providers: SeriousBroker.it  This test is not yet approved or cleared by the [place]  FDA and has been authorized for detection and/or diagnosis of SARS-CoV-2 by FDA under an Emergency Use Authorization (EUA). This EUA will remain in effect (meaning this test can be used) for the duration of the COVID-19 declaration under Section 564(b)(1) of the Act, 21 U.S.C. section 360bbb-3(b)(1), unless the authorization is terminated or revoked.     Resp Syncytial Virus by PCR NEGATIVE NEGATIVE Final    Comment: (NOTE) Fact Sheet for Patients: BloggerCourse.com  Fact Sheet for Healthcare Providers: SeriousBroker.it  This test is not yet approved or cleared by the [place]  FDA and has been authorized for detection and/or diagnosis  of SARS-CoV-2 by FDA under an Emergency Use Authorization (EUA). This EUA will remain in effect (meaning this test can be used) for the duration of the COVID-19 declaration under Section 564(b)(1) of the Act, 21 U.S.C. section 360bbb-3(b)(1), unless the authorization is terminated or revoked.  Performed at [facility], [address] [place], [place] [address]   Respiratory (~20 pathogens) panel by PCR     Status: None   Collection Time: 10/17/23 10:03 AM   Specimen: Nasopharyngeal Swab; Respiratory  Result Value Ref Range Status   Adenovirus NOT DETECTED NOT DETECTED Final   Coronavirus 229E NOT DETECTED NOT DETECTED Final    Comment: (NOTE) The Coronavirus on the Respiratory Panel, DOES NOT test for the novel  Coronavirus (2019 nCoV)    Coronavirus HKU1 NOT DETECTED NOT DETECTED Final   Coronavirus NL63 NOT DETECTED NOT DETECTED Final   Coronavirus OC43 NOT  DETECTED NOT DETECTED Final   Metapneumovirus NOT DETECTED NOT DETECTED Final   Rhinovirus / Enterovirus NOT DETECTED NOT DETECTED Final   Influenza A NOT DETECTED NOT DETECTED Final   Influenza B NOT DETECTED NOT DETECTED Final   Parainfluenza Virus 1 NOT DETECTED NOT DETECTED Final   Parainfluenza Virus 2 NOT DETECTED NOT DETECTED Final   Parainfluenza Virus 3 NOT DETECTED NOT DETECTED Final   Parainfluenza Virus 4 NOT DETECTED NOT DETECTED Final   Respiratory Syncytial Virus NOT DETECTED NOT DETECTED Final   Bordetella pertussis NOT DETECTED NOT DETECTED Final   Bordetella Parapertussis NOT DETECTED NOT DETECTED Final   Chlamydophila pneumoniae NOT DETECTED NOT DETECTED Final   Mycoplasma pneumoniae NOT DETECTED NOT DETECTED Final    Comment: Performed at [facility], [address] [place], [place] [address]  Culture, blood (Routine X 2) w Reflex to ID Panel     Status: None (Preliminary result)   Collection Time: 10/17/23 11:05 AM   Specimen: BLOOD  Result Value Ref Range Status   Specimen Description BLOOD BLOOD RIGHT HAND  Final   Special Requests   Final    BOTTLES DRAWN AEROBIC ONLY Blood Culture results may not be optimal due to an inadequate volume of blood received in culture bottles   Culture   Final    NO GROWTH < 24 HOURS Performed at [facility], [address], [place], [place] [address]    Report Status PENDING  Incomplete    Coagulation Studies: Recent Labs    10/16/23 1520  LABPROT 17.3*  INR 1.3*    Urinalysis: Recent Labs    10/16/23 2204  COLORURINE YELLOW*  LABSPEC 1.010  PHURINE 5.0  GLUCOSEU NEGATIVE  HGBUR SMALL*  BILIRUBINUR NEGATIVE  KETONESUR NEGATIVE  PROTEINUR NEGATIVE  NITRITE NEGATIVE  LEUKOCYTESUR LARGE*      Imaging: US  RENAL Result Date: 10/17/2023 CLINICAL DATA:  Acute renal failure. EXAM: RENAL / URINARY TRACT ULTRASOUND COMPLETE COMPARISON:  November 11, 2004 FINDINGS: Right Kidney: Renal measurements:  9.0 cm x 4.6 cm x 4.8 cm = volume: 103.2 mL. Echogenicity within normal limits. No mass or hydronephrosis visualized. Left Kidney: Renal measurements: 9.4 cm x 4.4 cm x 4.6 cm = volume: 97.6 mL. Mildly increased echogenicity of the renal parenchyma is seen. No mass or hydronephrosis visualized. Bladder: A Foley catheter is in place. Other: None. IMPRESSION: Mildly echogenic left kidney which may represent a normal variant versus sequelae associated with medical renal disease. Electronically Signed   By: Suzen Dials M.D.   On: 10/17/2023 15:52   DG Chest  Port 1 View Result Date: 10/17/2023 CLINICAL DATA:  Central line placement EXAM: PORTABLE CHEST 1 VIEW COMPARISON:  Same day chest radiograph and prior studies FINDINGS: Interval placement of right IJ approach central venous catheter with catheter in place with tip at the cavoatrial junction/upper right atrium. Unchanged left IJ approach central venous catheter. Unchanged cardiac pacemaker. Low lung volumes. No pleural effusions. No pneumothorax. Unchanged cardiomediastinal silhouette. IMPRESSION: Interval placement of right IJ approach central venous catheter with tip at the cavoatrial junction/upper right atrium. Unchanged left IJ approach central venous catheter. No pneumothorax. Electronically Signed   By: Michaeline Blanch M.D.   On: 10/17/2023 12:37   DG Chest Port 1 View Result Date: 10/17/2023 CLINICAL DATA:  Central line placement EXAM: PORTABLE CHEST 1 VIEW COMPARISON:  Yesterday FINDINGS: Placement of a left IJ line with tip at low SVC. Patient rotated left. Midline trachea. Mild cardiomegaly. Atherosclerosis in the transverse aorta. No pleural effusion or pneumothorax. No congestive failure. Clear lungs. IMPRESSION: Left IJ line tip at low SVC, without pneumothorax. Cardiomegaly without congestive failure. Aortic Atherosclerosis (ICD10-I70.0). Electronically Signed   By: Rockey Kilts M.D.   On: 10/17/2023 08:48   CT Head Wo Contrast Result Date:  10/16/2023 CLINICAL DATA:  Mental status change, unknown cause EXAM: CT HEAD WITHOUT CONTRAST TECHNIQUE: Contiguous axial images were obtained from the base of the skull through the vertex without intravenous contrast. RADIATION DOSE REDUCTION: This exam was performed according to the departmental dose-optimization program which includes automated exposure control, adjustment of the mA and/or kV according to patient size and/or use of iterative reconstruction technique. COMPARISON:  CT head 09/16/2020 FINDINGS: Brain: Patchy and confluent areas of decreased attenuation are noted throughout the deep and periventricular white matter of the cerebral hemispheres bilaterally, compatible with chronic microvascular ischemic disease. No evidence of large-territorial acute infarction. No parenchymal hemorrhage. No mass lesion. No extra-axial collection. No mass effect or midline shift. No hydrocephalus. Basilar cisterns are patent. Vascular: No hyperdense vessel. Atherosclerotic calcifications are present within the cavernous internal carotid arteries. Skull: No acute fracture or focal lesion. Sinuses/Orbits: Paranasal sinuses and mastoid air cells are clear. The orbits are unremarkable. Other: None. IMPRESSION: No acute intracranial abnormality. Electronically Signed   By: Morgane  Naveau M.D.   On: 10/16/2023 18:56   CT ABDOMEN PELVIS WO CONTRAST Result Date: 10/16/2023 CLINICAL DATA:  LLQ abdominal pain also with vaginal bleeding EXAM: CT ABDOMEN AND PELVIS WITHOUT CONTRAST TECHNIQUE: Multidetector CT imaging of the abdomen and pelvis was performed following the standard protocol without IV contrast. RADIATION DOSE REDUCTION: This exam was performed according to the departmental dose-optimization program which includes automated exposure control, adjustment of the mA and/or kV according to patient size and/or use of iterative reconstruction technique. COMPARISON:  None Available. FINDINGS: Lower chest: Bilateral lower  lobe linear atelectasis versus scarring. Coronary artery calcification. Cardiac leads partially visualized. Hepatobiliary: No focal liver abnormality. Status post cholecystectomy. No biliary dilatation. Pancreas: Diffusely atrophic. No focal lesion. Otherwise normal pancreatic contour. No surrounding inflammatory changes. No main pancreatic ductal dilatation. Spleen: Normal in size without focal abnormality. Adrenals/Urinary Tract: No adrenal nodule bilaterally. Punctate left renal calcification. No hydronephrosis. No definite contour-deforming renal mass. No ureterolithiasis or hydroureter. The urinary bladder is unremarkable. Stomach/Bowel: Stomach is within normal limits. No evidence of bowel wall thickening or dilatation. Slightly hypertrophic ileocecal valve. Appendix appears normal. Vascular/Lymphatic: No abdominal aorta or iliac aneurysm. Severe atherosclerotic plaque of the aorta and its branches. No abdominal, pelvic, or inguinal lymphadenopathy. Reproductive: Status post  hysterectomy. No adnexal masses. Other: No intraperitoneal free fluid. No intraperitoneal free gas. No organized fluid collection. Musculoskeletal: No abdominal wall hernia or abnormality. No suspicious lytic or blastic osseous lesions. No acute displaced fracture. Multilevel degenerative changes of the spine. IMPRESSION: 1. No acute intra-abdominal or intrapelvic abnormality with limited evaluation on this noncontrast study. 2. Punctate nonobstructive left nephrolithiasis. 3. Status post cholecystectomy and hysterectomy. 4.  Aortic Atherosclerosis (ICD10-I70.0). Electronically Signed   By: Morgane  Naveau M.D.   On: 10/16/2023 18:54   DG Chest Portable 1 View Result Date: 10/16/2023 CLINICAL DATA:  Chest pain. EXAM: PORTABLE CHEST 1 VIEW COMPARISON:  February 09, 2021. FINDINGS: Stable cardiomediastinal silhouette. Left-sided pacemaker is unchanged. Both lungs are clear. The visualized skeletal structures are unremarkable. IMPRESSION:  No active disease. Electronically Signed   By: Lynwood Landy Raddle M.D.   On: 10/16/2023 15:25     Medications:    cefTRIAXone  (ROCEPHIN )  IV Stopped (10/17/23 2336)   famotidine  (PEPCID ) IV 20 mg (10/18/23 1055)   heparin  650 Units/hr (10/18/23 1335)   metronidazole  Stopped (10/18/23 0600)   norepinephrine  (LEVOPHED ) Adult infusion 5 mcg/min (10/18/23 1440)   prismasol  BGK 4/2.5 400 mL/hr (10/18/23 0837)   prismasol  BGK 4/2.5 400 mL/hr (10/18/23 0837)   prismasol  BGK 4/2.5 1,500 mL/hr (10/18/23 1204)   vasopressin  0.04 Units/min (10/18/23 1314)    atorvastatin   20 mg Oral Daily   Chlorhexidine  Gluconate Cloth  6 each Topical Daily   insulin  aspart  0-9 Units Subcutaneous Q4H   levETIRAcetam   1,000 mg Intravenous Q12H   thiamine  (VITAMIN B1) injection  100 mg Intravenous QHS   heparin   Assessment/ Plan:  [name] is a 78 y.o.  female with past medical conditions including GERD, CVA, hypertension, diabetes, hyperlipidemia, CAD, gout, obstructive sleep apnea, UTI, chronic kidney disease stage III, who was admitted to [facility] on 10/16/2023 for Altered mental status [R41.82] Altered mental status, unspecified altered mental status type [R41.82]   Acute kidney injury likely secondary to septic UTI. Baseline unknown. UA positive for leukocytes and bacteria. No recent IV contrast exposure. Creatinine on ED arrival 8.32.  CRRT initiated on 7/15, net even. Oliguric. Remains on 2 pressors. Continue CRRT for now.   Lab Results  Component Value Date   CREATININE 3.23 (H) 10/18/2023   CREATININE 5.74 (H) 10/17/2023   CREATININE 8.16 (H) 10/17/2023    Intake/Output Summary (Last 24 hours) at 10/18/2023 1501 Last data filed at 10/18/2023 1400 Gross per 24 hour  Intake 3273.61 ml  Output 3435 ml  Net -161.39 ml   2.  Hyperkalemia/hypomagnesium.  Potassium 3.6.  Magnesium   2.1.  Placed on 4K bath yesterday evening. Lokelma  stopped.    3.  Acute metabolic acidosis.  Serum bicarb 10  on ED arrival.  Corrected with dialysis and IV supplementation.  IV fluids have been stopped.     4.  Sepsis likely secondary to UTI with suspected abdominal involvement based on nausea, vomiting, and diarrhea.  UA positive for UTI, urine culture pending.  Patient does have history of E. coli and Enterococcus facilities UTI.  Currently prescribed ceftriaxone , metronidazole , and vancomycin .    LOS: 2 Traci Carter 7/16/20253:01 PM

## 2023-10-18 NOTE — Progress Notes (Addendum)
 0730 Patient remains confused. Very delayed in her responses to questions. Knows her name and part of her birthday. Sacrum red from frequent stools but blanches.  1130 Taking sips of soda. Passed swallow eval. Swallows well. 1235 Heparin  stopped per orders. 1330 Had medium BM x 2. Requested a bedpan but unable to judge if she had used it. 1335 Heparin  restarted at 650 units/hour as ordered. Remains confused except to name.1400 Family in to see patient. Continuing to wean Levophed . More alert this afternoon. 1630 Eating small amount of pudding and orange sherbert. Also sipping on cola.

## 2023-10-19 LAB — RENAL FUNCTION PANEL
Albumin: 2.6 g/dL — ABNORMAL LOW (ref 3.5–5.0)
Albumin: 3.3 g/dL — ABNORMAL LOW (ref 3.5–5.0)
Anion gap: 9 (ref 5–15)
Anion gap: 9 (ref 5–15)
BUN: 20 mg/dL (ref 8–23)
BUN: 17 mg/dL (ref 8–23)
CO2: 24 mmol/L (ref 22–32)
CO2: 24 mmol/L (ref 22–32)
Calcium: 7.5 mg/dL — ABNORMAL LOW (ref 8.9–10.3)
Calcium: 8 mg/dL — ABNORMAL LOW (ref 8.9–10.3)
Chloride: 105 mmol/L (ref 98–111)
Chloride: 102 mmol/L (ref 98–111)
Creatinine, Ser: 1.45 mg/dL — ABNORMAL HIGH (ref 0.44–1.00)
Creatinine, Ser: 1.08 mg/dL — ABNORMAL HIGH (ref 0.44–1.00)
GFR, Estimated: 37 mL/min — ABNORMAL LOW (ref >60)
GFR, Estimated: 53 mL/min — ABNORMAL LOW (ref >60)
Glucose, Bld: 118 mg/dL — ABNORMAL HIGH (ref 70–99)
Glucose, Bld: 259 mg/dL — ABNORMAL HIGH (ref 70–99)
Phosphorus: 2.2 mg/dL — ABNORMAL LOW (ref 2.5–4.6)
Phosphorus: 1.9 mg/dL — ABNORMAL LOW (ref 2.5–4.6)
Potassium: 3.5 mmol/L (ref 3.5–5.1)
Potassium: 3.4 mmol/L — ABNORMAL LOW (ref 3.5–5.1)
Sodium: 138 mmol/L (ref 135–145)
Sodium: 135 mmol/L (ref 135–145)

## 2023-10-19 LAB — CBC
HCT: 25.3 % — ABNORMAL LOW (ref 36.0–46.0)
Hemoglobin: 8.7 g/dL — ABNORMAL LOW (ref 12.0–15.0)
MCH: 32 pg (ref 26.0–34.0)
MCHC: 34.4 g/dL (ref 30.0–36.0)
MCV: 93 fL (ref 80.0–100.0)
Platelets: 118 K/uL — ABNORMAL LOW (ref 150–400)
RBC: 2.72 MIL/uL — ABNORMAL LOW (ref 3.87–5.11)
RDW: 13.7 % (ref 11.5–15.5)
WBC: 7 K/uL (ref 4.0–10.5)
nRBC: 0.4 % — ABNORMAL HIGH (ref 0.0–0.2)

## 2023-10-19 LAB — GLUCOSE, CAPILLARY
Glucose-Capillary: 103 mg/dL — ABNORMAL HIGH (ref 70–99)
Glucose-Capillary: 100 mg/dL — ABNORMAL HIGH (ref 70–99)
Glucose-Capillary: 182 mg/dL — ABNORMAL HIGH (ref 70–99)
Glucose-Capillary: 236 mg/dL — ABNORMAL HIGH (ref 70–99)
Glucose-Capillary: 184 mg/dL — ABNORMAL HIGH (ref 70–99)
Glucose-Capillary: 217 mg/dL — ABNORMAL HIGH (ref 70–99)

## 2023-10-19 LAB — HEPARIN LEVEL (UNFRACTIONATED): Heparin Unfractionated: 1.04 [IU]/mL — ABNORMAL HIGH (ref 0.30–0.70)

## 2023-10-19 LAB — APTT
aPTT: 76 s — ABNORMAL HIGH (ref 24–36)
aPTT: 91 s — ABNORMAL HIGH (ref 24–36)
aPTT: 75 s — ABNORMAL HIGH (ref 24–36)

## 2023-10-19 LAB — URINE CULTURE: Culture: 80000 — AB

## 2023-10-19 LAB — MAGNESIUM: Magnesium: 2.3 mg/dL (ref 1.7–2.4)

## 2023-10-19 MED ORDER — ALBUMIN HUMAN 25 % IV SOLN
25.0000 g | INTRAVENOUS | Status: DC
  Administered 2023-10-19: 25 g via INTRAVENOUS
  Filled 2023-10-19: qty 100

## 2023-10-19 MED ORDER — ARGATROBAN 50 MG/50ML IV SOLN
0.2900 ug/kg/min | INTRAVENOUS | Status: DC
  Administered 2023-10-19: 0.4 ug/kg/min via INTRAVENOUS
  Administered 2023-10-19: 0.5 ug/kg/min via INTRAVENOUS
  Administered 2023-10-21: 0.29 ug/kg/min via INTRAVENOUS
  Filled 2023-10-19 (×3): qty 50

## 2023-10-19 MED ORDER — LOPERAMIDE HCL 2 MG PO CAPS
2.0000 mg | ORAL_CAPSULE | ORAL | Status: DC | PRN
  Administered 2023-10-19 – 2023-10-20 (×2): 2 mg via ORAL
  Filled 2023-10-19 (×2): qty 1

## 2023-10-19 MED ORDER — FAMOTIDINE 20 MG PO TABS
20.0000 mg | ORAL_TABLET | Freq: Two times a day (BID) | ORAL | Status: DC
  Administered 2023-10-19 – 2023-10-22 (×8): 20 mg via ORAL
  Filled 2023-10-19 (×8): qty 1

## 2023-10-19 MED ORDER — K PHOS MONO-SOD PHOS DI & MONO 155-852-130 MG PO TABS
250.0000 mg | ORAL_TABLET | Freq: Once | ORAL | Status: AC
  Administered 2023-10-19: 250 mg via ORAL
  Filled 2023-10-19: qty 1

## 2023-10-19 MED ORDER — BOOST / RESOURCE BREEZE PO LIQD CUSTOM
1.0000 | Freq: Three times a day (TID) | ORAL | Status: DC
  Administered 2023-10-19 – 2023-10-23 (×9): 1 via ORAL

## 2023-10-19 MED ORDER — POTASSIUM PHOSPHATES 15 MMOLE/5ML IV SOLN
30.0000 mmol | Freq: Once | INTRAVENOUS | Status: AC
  Administered 2023-10-19: 30 mmol via INTRAVENOUS
  Filled 2023-10-19: qty 10

## 2023-10-19 MED ORDER — ANTICOAGULANT SODIUM CITRATE 4% (200MG/5ML) IV SOLN
5.0000 mL | Status: DC | PRN
  Administered 2023-10-19: 5 mL
  Filled 2023-10-19 (×11): qty 5

## 2023-10-19 NOTE — Progress Notes (Signed)
 PHARMACY - ANTICOAGULATION CONSULT NOTE  Pharmacy Consult for heparin  infusion Indication: atrial fibrillation  Allergies  Allergen Reactions   Codeine Other (See Comments)    GI Distress GI Distress GI Distress   Codeine    Digoxin     Other reaction(s): Unknown   Lanoxin [Digoxin]    Penicillins    Zoloft [Sertraline Hcl]    Penicillins Rash and Other (See Comments)    Patient Measurements: Height: 5' 3 (160 cm) Weight: 101.6 kg (223 lb 15.8 oz) IBW/kg (Calculated) : 52.4 HEPARIN  DW (KG): 76.8  Vital Signs: Temp: 97.9 F (36.6 C) (07/17 0400) Temp Source: Axillary (07/17 0400) BP: 106/56 (07/17 0700) Pulse Rate: 62 (07/17 0700)  Labs: Recent Labs    10/16/23 1520 10/17/23 0441 10/17/23 1003 10/17/23 1934 10/17/23 2105 10/17/23 2351 10/18/23 0409 10/18/23 0738 10/18/23 1532 10/18/23 2151 10/19/23 0405  HGB 13.9 11.7*  --   --   --   --  9.7*  --   --   --  8.7*  HCT 44.3 38.9  --   --   --   --  28.6*  --   --   --  25.3*  PLT 301 261  --   --   --   --  182  --   --   --  118*  APTT  --   --   --   --  >200*  --   --  >200*  --  168*  --   LABPROT 17.3*  --   --   --   --   --   --   --   --   --   --   INR 1.3*  --   --   --   --   --   --   --   --   --   --   HEPARINUNFRC  --   --   --   --   --   --   --  >1.10*  --   --   --   CREATININE 8.32* 8.36*   < >  --   --   --  3.23*  --  2.20*  --  1.45*  CKTOTAL 230  --   --   --   --   --   --   --   --   --   --   TROPONINIHS 53*  --    < > 123*  --  175* 196*  --   --   --   --    < > = values in this interval not displayed.    Estimated Creatinine Clearance: 37.6 mL/min (A) (by C-G formula based on SCr of 1.45 mg/dL (H)).   Medical History: Past Medical History:  Diagnosis Date   Afib (HCC)    Atrial fibrillation (HCC)    Diabetes mellitus without complication (HCC)    DM (diabetes mellitus) (HCC)    Dysrhythmia    HTN (hypertension)    Hypertension    Stroke (HCC) 07/21/12   posterior  superior left frontal lobe parenchymal hemorrhage     Medications:  Scheduled:   vitamin C   500 mg Oral BID   atorvastatin   20 mg Oral Daily   Chlorhexidine  Gluconate Cloth  6 each Topical Daily   feeding supplement  237 mL Oral TID BM   insulin  aspart  0-9 Units Subcutaneous Q4H   levETIRAcetam   1,000 mg Intravenous Q12H  multivitamin  1 tablet Oral QHS   thiamine  (VITAMIN B1) injection  100 mg Intravenous QHS   traZODone   50 mg Oral QHS   Infusions:   cefTRIAXone  (ROCEPHIN )  IV Stopped (10/19/23 0001)   famotidine  (PEPCID ) IV Stopped (10/18/23 1202)   heparin  450 Units/hr (10/19/23 0700)   metronidazole  Stopped (10/19/23 0504)   norepinephrine  (LEVOPHED ) Adult infusion 3 mcg/min (10/19/23 0700)   prismasol  BGK 4/2.5 400 mL/hr at 10/18/23 2123   prismasol  BGK 4/2.5 400 mL/hr at 10/18/23 2122   prismasol  BGK 4/2.5 1,500 mL/hr at 10/19/23 0455   vasopressin  Stopped (10/19/23 0251)    Assessment: 78 y.o. female presenting with altered mental status. PMH significant for paroxysmal atrial fibrillation, pacemaker for complete heart block, hypertension, hyperlipidemia, prior CVA, diabetes. She takes apixaban  PTA with last dose 07/15 0155  Baseline Labs: Hgb 11.7, PLT wnl, INR 1.3  Goal of Therapy:  anti-Xa level 0.3-0.7 units/ml aPTT 66 - 102 seconds Monitor platelets by anticoagulation protocol: Yes   Plan: aPTT therapeutic x 1 ---continue heparin  infusion at 450 units/hr  ---Check aPTT in 8 hours to confirm (we will use aPTT to guide therapy until aPTT and anti-Xa level correlate) ---Continue to monitor H&H and platelets  Traci Carter 10/19/2023,7:11 AM

## 2023-10-19 NOTE — Progress Notes (Signed)
 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 Coli and Enterococcus faecalis UTI who presented to the ED with chief complaints of altered mental status.   Per the ED report, the patient's husband is currently admitted to the hospital and was transitioned to comfort care on 10/15/23. The patient's daughter states the patient has continued to decline and has not been eating, drinking, or taking her medications. Her condition worsened over the past two days, with nausea, vomiting, and diarrhea. The patient was noted with worsening confusion and generalized weakness today, which prompted further evaluation in the ED.   ED Course: Initial vital signs showed HR of 68 beats/minute, BP 109/50 mm Hg, the RR 18 breaths/minute, and the oxygen saturation 95 % on RA and a temperature of 97.107F (36.4C). 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 Coli and Enterococcus faecalis UTI  Significant Hospital Events   7/14: Admit to ICU with altered mental status found to be septic due to UTI and probable hypovolemia iso dehydration 10/17/23- patient continued to have worsening renal failure and mental status. Reviewed with  nephrology, started on CRRT today.  VBG with severe acidosis improved post treatment.  Remains severely critically ill.  10/18/23- patient improved, weaning down on vasopressors, renal function improved overnight, +encephalopathy but able to communicate with delayed response.  Remains critically ill.  MRSA neg dc vanco. 10/19/23- patient is able to speak , still a bit confused,  she ate something yesterday but still not totally able to feed herself, remains on low dose levophed  currently at 4mcg, net zero on CRRT,  has Foley with 25cc UOP hourly, right and left neck central lines.  Bloodwork with CBC showing trending down H/h and platelets. HITS workup in process.   Consults:  None  Procedures:  None  Interim History / Subjective:      Micro Data:  7/14: Blood culture x2> 7/14: Urine Culture> 7/14: MRSA PCR>>   Antimicrobials:  Ceftriaxone  7/14 Metronidazole  7/14  OBJECTIVE  Blood pressure (!) 104/56, pulse (!) 59, temperature 98.2 F (36.8 C), temperature source Oral, resp. rate 12, height 5' 3 (1.6 m), weight 101.6 kg, SpO2 96%.        Intake/Output Summary (Last 24 hours) at 10/19/2023 0824 Last data filed at 10/19/2023 0800 Gross per 24 hour  Intake 903.26 ml  Output 1641 ml  Net -737.74 ml   Filed Weights   10/18/23 0400 10/18/23 0900 10/19/23 0311  Weight: 103.4 kg 101.6 kg 101.6 kg    Physical Examination  GENERAL: 78 year-old critically ill patient lying in the bed  EYES: PEERLA. No scleral icterus. Extraocular muscles intact.  HEENT: Head atraumatic, normocephalic. Oropharynx and nasopharynx clear.  CARDIOVASCULAR:S1, S2 normal. No murmurs, rubs, or gallops.   PULMONARY: Lungs course with wheezes ABDOMINAL: Soft, NTND MUSCULOSKELETAL:  No swelling. Normal range of motion. 4/5 strength in all extremities.   NEUROLOGICAL: General: No focal deficit present. She is alert and oriented to person only.  PSYCHIATRIC:  Mood and Affect: Mood normal.  SKIN:Skin is warm  and dry. No obvious rash, lesion, or ulcer. Capillary Refill:< 2sec  Labs/imaging that I havepersonally reviewed  (right click and Reselect all SmartList Selections daily)  US  RENAL Result Date: 10/17/2023 CLINICAL DATA:  Acute renal failure. EXAM: RENAL / URINARY TRACT ULTRASOUND COMPLETE COMPARISON:  November 11, 2004 FINDINGS: Right Kidney: Renal measurements: 9.0 cm x 4.6 cm x 4.8 cm = volume: 103.2 mL. Echogenicity within normal limits. No mass or hydronephrosis visualized. Left Kidney: Renal measurements: 9.4 cm x 4.4 cm x 4.6 cm = volume: 97.6 mL. Mildly increased echogenicity of the renal parenchyma is seen. No mass or hydronephrosis visualized. Bladder: A Foley catheter is in place. Other: None. IMPRESSION: Mildly echogenic left kidney which may represent a normal variant versus sequelae associated with medical renal disease. Electronically Signed   By: Suzen Dials M.D.   On: 10/17/2023 15:52   DG Chest Port 1 View Result Date: 10/17/2023 CLINICAL DATA:  Central line placement EXAM: PORTABLE CHEST 1 VIEW COMPARISON:  Same day chest radiograph and prior studies FINDINGS: Interval placement of right IJ approach central venous catheter with catheter in place with tip at the cavoatrial junction/upper right atrium. Unchanged left IJ approach central venous catheter. Unchanged cardiac pacemaker. Low lung volumes. No pleural effusions. No pneumothorax. Unchanged cardiomediastinal silhouette. IMPRESSION: Interval placement of right IJ approach central venous catheter with tip at the cavoatrial junction/upper right atrium. Unchanged left IJ approach central venous catheter. No pneumothorax. Electronically Signed   By: Michaeline Blanch M.D.   On: 10/17/2023 12:37   DG Chest Port 1 View Result Date: 10/17/2023 CLINICAL DATA:  Central line placement EXAM: PORTABLE CHEST 1 VIEW COMPARISON:  Yesterday FINDINGS: Placement of a left IJ line with tip at low SVC. Patient rotated left. Midline trachea. Mild  cardiomegaly. Atherosclerosis in the transverse aorta. No pleural effusion or pneumothorax. No congestive failure. Clear lungs. IMPRESSION: Left IJ line tip at low SVC, without pneumothorax. Cardiomegaly without congestive failure. Aortic Atherosclerosis (ICD10-I70.0). Electronically Signed   By: Rockey Kilts M.D.   On: 10/17/2023 08:48    Labs   CBC: Recent Labs  Lab 10/16/23 1520 10/17/23 0441 10/18/23 0409 10/19/23 0405  WBC 11.8* 13.8* 11.2* 7.0  HGB 13.9 11.7* 9.7* 8.7*  HCT 44.3 38.9 28.6* 25.3*  MCV 100.0 102.6* 93.5 93.0  PLT 301 261 182 118*    Basic Metabolic Panel: Recent Labs  Lab 10/16/23 1520 10/17/23 0441 10/17/23 1003 10/17/23 1703 10/18/23 0409 10/18/23 1532 10/19/23 0405  NA 142 136 137 137 137 136 138  K 5.7* 6.2* 4.9 3.6 3.6 3.8 3.5  CL 106 105 103 99 102 102 105  CO2 10* 7* 10* 13* 23 20* 24  GLUCOSE 28* 181* 211* 104* 107* 163* 118*  BUN 91* 93* 96* 69* 42* 30* 20  CREATININE 8.32* 8.36* 8.16* 5.74* 3.23* 2.20* 1.45*  CALCIUM  8.9 7.9* 7.9* 7.6* 6.9* 7.3* 7.5*  MG 1.5* 2.0  --   --  2.1  --  2.3  PHOS  --  9.5*  --  5.4* 3.2 3.0 2.2*   GFR: Estimated Creatinine Clearance: 37.6 mL/min (A) (by C-G formula based on SCr of 1.45 mg/dL (H)). Recent Labs  Lab 10/16/23 1520 10/17/23 0441 10/17/23 1003 10/17/23 1105 10/17/23 2105 10/17/23 2351 10/18/23  0409 10/19/23 0405  WBC 11.8* 13.8*  --   --   --   --  11.2* 7.0  LATICACIDVEN  --   --    < > 8.8* 5.2* 3.6* 2.0*  --    < > = values in this interval not displayed.   Liver Function Tests: Recent Labs  Lab 10/16/23 1520 10/17/23 0441 10/17/23 1703 10/18/23 0409 10/18/23 1532 10/19/23 0405  AST 32 25  --   --   --   --   ALT 17 15  --   --   --   --   ALKPHOS 83 76  --   --   --   --   BILITOT 0.7 0.8  --   --   --   --   PROT 7.0 5.9*  --   --   --   --   ALBUMIN  3.4* 2.9* 3.1* 2.7* 2.9* 2.6*   No results for input(s): LIPASE, AMYLASE in the last 168 hours. No results for  input(s): AMMONIA in the last 168 hours.  ABG    Component Value Date/Time   PHART 7.03 (LL) 10/17/2023 0746   PCO2ART 25 (L) 10/17/2023 0746   PO2ART 98 10/17/2023 0746   HCO3 22.7 10/18/2023 0409   ACIDBASEDEF 3.5 (H) 10/18/2023 0409   O2SAT 74.8 10/18/2023 0409    Coagulation Profile: Recent Labs  Lab 10/16/23 1520  INR 1.3*   Cardiac Enzymes: Recent Labs  Lab 10/16/23 1520  CKTOTAL 230   HbA1C: Hgb A1c MFr Bld  Date/Time Value Ref Range Status  10/17/2023 04:41 AM 5.7 (H) 4.8 - 5.6 % Final    Comment:    (NOTE)         Prediabetes: 5.7 - 6.4         Diabetes: >6.4         Glycemic control for adults with diabetes: <7.0   01/17/2021 04:43 AM 11.5 (H) 4.8 - 5.6 % Final    Comment:    (NOTE)         Prediabetes: 5.7 - 6.4         Diabetes: >6.4         Glycemic control for adults with diabetes: <7.0    CBG: Recent Labs  Lab 10/18/23 1139 10/18/23 1537 10/18/23 1933 10/18/23 2306 10/19/23 0308  GLUCAP 131* 158* 140* 138* 103*   Review of Systems:   Unable to be obtained secondary to the patient's altered mental status.   Past Medical History  She,  has a past medical history of Afib (HCC), Atrial fibrillation (HCC), Diabetes mellitus without complication (HCC), DM (diabetes mellitus) (HCC), Dysrhythmia, HTN (hypertension), Hypertension, and Stroke (HCC) (07/21/12).   Surgical History    Past Surgical History:  Procedure Laterality Date   ABDOMINAL HYSTERECTOMY     CARPAL TUNNEL RELEASE     COLONOSCOPY  01/02/2009   Dr Dessa diverticulosis.   COLONOSCOPY WITH PROPOFOL  N/A 06/28/2017   Procedure: COLONOSCOPY WITH PROPOFOL ;  Surgeon: Dessa Reyes ORN, MD;  Location: [facility] ENDOSCOPY;  Service: Endoscopy;  Laterality: N/A;   ELBOW SURGERY     INSERT / REPLACE / REMOVE PACEMAKER     Dr Ammon   JOINT REPLACEMENT  2005   knee   SHOULDER ARTHROSCOPY       Social History   reports that she has never smoked. She has never used smokeless  tobacco. She reports that she does not drink alcohol and does not use drugs.  Family History   Her family history includes Breast cancer in her mother; Prostate cancer in her father.   Allergies Allergies  Allergen Reactions   Codeine Other (See Comments)    GI Distress GI Distress GI Distress   Codeine    Digoxin     Other reaction(s): Unknown   Lanoxin [Digoxin]    Penicillins    Zoloft [Sertraline Hcl]    Penicillins Rash and Other (See Comments)     Home Medications  Prior to Admission medications   Medication Sig Start Date End Date Taking? Authorizing Provider  acetaminophen  (TYLENOL ) 325 MG tablet Take 2 tablets (650 mg total) by mouth every 6 (six) hours as needed for mild pain, headache, fever or moderate pain. 09/21/20   Elgergawy, Brayton RAMAN, MD  albuterol  (PROVENTIL ) (2.5 MG/3ML) 0.083% nebulizer solution Inhale 3 mLs into the lungs every 4 (four) hours as needed for wheezing or shortness of breath. 09/21/20   Elgergawy, Brayton RAMAN, MD  albuterol  (VENTOLIN  HFA) 108 (90 Base) MCG/ACT inhaler Inhale 2 puffs into the lungs every 6 (six) hours as needed for wheezing or shortness of breath. 08/12/20   [provider]  allopurinol  (ZYLOPRIM ) 100 MG tablet Take 100 mg by mouth daily. 11/28/20   [provider]  atorvastatin  (LIPITOR) 20 MG tablet Take 20 mg by mouth daily.    [provider]  colchicine  0.6 MG tablet Take 1 tablet (0.6 mg total) by mouth daily. 02/11/21   Jens Durand, MD  ELIQUIS  5 MG TABS tablet Take 5 mg by mouth 2 (two) times daily. 01/07/21   [provider]  famotidine  (PEPCID ) 20 MG tablet Take 20 mg by mouth 2 (two) times daily.    [provider]  furosemide  (LASIX ) 20 MG tablet Take 1 tablet (20 mg total) by mouth daily as needed (weight gain >3 lbs in 1 day and >5 lbs in 2 days). 10/20/20   Tobie Yetta HERO, MD  glipiZIDE  (GLUCOTROL  XL) 5 MG 24 hr tablet Take 5 mg by mouth daily. 01/04/21   [provider]   insulin  aspart (NOVOLOG ) 100 UNIT/ML injection Inject 0-9 Units into the skin 3 (three) times daily with meals. 09/21/20   Elgergawy, Brayton RAMAN, MD  levETIRAcetam  (KEPPRA ) 1000 MG tablet Take 1,000 mg by mouth 2 (two) times daily. 01/08/21   [provider]  lisinopril  (ZESTRIL ) 5 MG tablet Take 5 mg by mouth daily. 11/07/20   [provider]  metFORMIN  (GLUCOPHAGE ) 1000 MG tablet Take 1,000 mg by mouth 2 (two) times daily. 11/28/20   [provider]  polyethylene glycol (MIRALAX  / GLYCOLAX ) 17 g packet Take 17 g by mouth 2 (two) times daily. 10/20/20   Patel, Pranav M, MD  Scheduled Meds:  vitamin C   500 mg Oral BID   atorvastatin   20 mg Oral Daily   Chlorhexidine  Gluconate Cloth  6 each Topical Daily   feeding supplement  237 mL Oral TID BM   insulin  aspart  0-9 Units Subcutaneous Q4H   levETIRAcetam   1,000 mg Intravenous Q12H   multivitamin  1 tablet Oral QHS   thiamine  (VITAMIN B1) injection  100 mg Intravenous QHS   traZODone   50 mg Oral QHS   Continuous Infusions:  cefTRIAXone  (ROCEPHIN )  IV Stopped (10/19/23 0001)   famotidine  (PEPCID ) IV Stopped (10/18/23 1202)   heparin  450 Units/hr (10/19/23 0700)   metronidazole  Stopped (10/19/23 0504)   norepinephrine  (LEVOPHED ) Adult infusion 3 mcg/min (10/19/23 0700)   prismasol  BGK 4/2.5 400  mL/hr at 10/18/23 2123   prismasol  BGK 4/2.5 400 mL/hr at 10/18/23 2122   prismasol  BGK 4/2.5 1,500 mL/hr at 10/19/23 9177   vasopressin  Stopped (10/19/23 0251)   PRN Meds:.acetaminophen , heparin   Active Hospital Problem list   See systems below  Assessment & Plan:  #Septic shock - PRESENT ON ADMISSION - due to UTI and probable intrabdominal source (nv and diarrhea) Hx of E. Coli and Enterococcus faecalis UTI  Presenting with altered mental status, UA positive for UTI -F/u cultures, trend lactic/ PCT -Monitor WBC/ fever curve -Continue IV abx pending cultures -GI panel pending -IVF hydration as needed -Pressors for MAP  goal >65 -Strict I/O's    #AKI on CKD stage III: AKI likely iso above #AGMA #Hyperkalemia #Hypomagnesium -Monitor I&O's / urinary output -Follow BMP -Ensure adequate renal perfusion -Avoid nephrotoxic agents as able -Replace electrolytes as indicated  #Paroxysmal Afib  Dual-chamber pacemaker for complete heart block #HTN:  #HLD:  -Hold home lisinopril .  -Continue Eliquis  -Continued home statin -Replete K<4, Mg<2   #Hx of seizures -Continued home keppra , will switch to IV while npo -PRN Lorazepam  for breakthrough seizure -seizure precaution  #Anxiety and Depression -No home meds   #T2DM #Peripheral neuropathy: In s/o diabetes history. Unclear if still taking lyrica  or Cymbalta   Presenting with hypoglycemia 23 -CBG's q2 -Target range of 140 to 180, avoid hypoglycemia -Hold SSI and Lantus  until stable -Follow ICU Hypo/Hyperglycemia protocol -Hold home Glipizide  and Metformin   #Chronic Gout   #FTT #Falls Risk She uses a walker at baseline and will use a wheelchair which she goes to the doctor's office. Recent multiple falls with decreased po intake -PT/OT -TOC consult for SNF placement consideration -Dietary consult -Palliative consult  #OSA not on CPAP Remote hx of Asthma/COPD -Duonebs scheduled and PRN  Best practice:  Diet:  Oral Pain/Anxiety/Delirium protocol (if indicated): No VAP protocol (if indicated): Not indicated DVT prophylaxis: Systemic AC GI prophylaxis: H2B Glucose control:  SSI Yes Central venous access:  N/A Arterial line:  N/A Foley:  N/A Mobility:  bed rest  PT consulted: Yes Last date of multidisciplinary goals of care discussion []  Code Status:  full code Disposition: ICU   = Goals of Care = Code Status Order: FULL  Primary Emergency Contact: Williams,Christy Wishes to pursue full aggressive treatment and intervention options, including CPR and intubation, but goals of care will be addressed on going with family if that should  become necessary.  Critical care provider statement:   Total critical care time: 33 minutes   Performed by: Parris MD   Critical care time was exclusive of separately billable procedures and treating other patients.   Critical care was necessary to treat or prevent imminent or life-threatening deterioration.   Critical care was time spent personally by me on the following activities: development of treatment plan with patient and/or surrogate as well as nursing, discussions with consultants, evaluation of patient's response to treatment, examination of patient, obtaining history from patient or surrogate, ordering and performing treatments and interventions, ordering and review of laboratory studies, ordering and review of radiographic studies, pulse oximetry and re-evaluation of patient's condition.    Esley Brooking, M.D.  Pulmonary & Critical Care Medicine

## 2023-10-19 NOTE — Consult Note (Signed)
 PHARMACY CONSULT NOTE - ELECTROLYTES  Pharmacy Consult for Electrolyte Monitoring and Replacement   Recent Labs: Height: 5' 3 (160 cm) Weight: 101.6 kg (223 lb 15.8 oz) IBW/kg (Calculated) : 52.4 Estimated Creatinine Clearance: 37.6 mL/min (A) (by C-G formula based on SCr of 1.45 mg/dL (H)). Potassium (mmol/L)  Date Value  10/19/2023 3.5  08/02/2012 3.6   Magnesium  (mg/dL)  Date Value  92/82/7974 2.3   Calcium  (mg/dL)  Date Value  92/82/7974 7.5 (L)   Calcium , Total (mg/dL)  Date Value  94/98/7985 9.3   Albumin  (g/dL)  Date Value  92/82/7974 2.6 (L)  07/21/2012 3.6   Phosphorus (mg/dL)  Date Value  92/82/7974 2.2 (L)   Sodium (mmol/L)  Date Value  10/19/2023 138  08/02/2012 138    Assessment  Traci Carter is a 78 y.o. female presenting with altered mental status. PMH significant for paroxysmal atrial fibrillation, pacemaker for complete heart block, hypertension, hyperlipidemia, prior CVA, diabetes. Pharmacy has been consulted to monitor and replace electrolytes while under PCCM care. CRRT started 07/15  Goal of Therapy: Electrolytes WNL  Plan: 250 po phosphorous x 1 Renal function panel BID while on CRRT  Thank you for allowing pharmacy to be a part of this patient's care.  [name], PharmD Clinical Pharmacist 10/19/2023 7:11 AM

## 2023-10-19 NOTE — Progress Notes (Signed)
 Pharmacy Heparin  Induced Thrombocytopenia (HIT) Note:  Traci Carter is an 78 y.o. female being evaluated for HIT. Heparin  was started 10/17/23 for atrial fibrillation, and baseline platelets were 301.   HIT labs were ordered on 10/19/23 when platelets dropped to 118.  Auto-populate labs: No results found for: HEPINDPLTAB, SRALOWDOSEHP, SRAHIGHDOSEH   CALCULATE SCORE:  4Ts (see the HIT Algorithm) Score  Thrombocytopenia 2  Timing 0  Thrombosis 0  Other causes of thrombocytopenia 2  Total 4     Recommendations (A or B) are based on available lab results (HIT antibody and/or SRA) and the HIT algorithm    A. HIT antibody result available  Possible HIT   Order SRA:  Yes Discontinue heparin  / LMWH:  Yes Initiate alternative anticoagulation:  Yes Document heparin  allergy:  Yes   B. SRA result availability  SRA not available   Name of MD Contacted: Parris, MD  Plan (Discussed with provider) Labs ordered:  SRA ordered  Heparin  allergy:  Heparin  allergy documented or updated. Anticoagulation plans:  Begin alternative anticoagulation with argatroban    Traci Carter 10/19/2023, 11:21 AM

## 2023-10-19 NOTE — Progress Notes (Signed)
 PHARMACY - ANTICOAGULATION CONSULT NOTE  Pharmacy Consult for transition from heparin  to argatroban  infusion Indication: atrial fibrillation  Allergies  Allergen Reactions   Codeine Other (See Comments)    GI Distress GI Distress GI Distress   Codeine    Digoxin     Other reaction(s): Unknown   Lanoxin [Digoxin]    Penicillins    Zoloft [Sertraline Hcl]    Penicillins Rash and Other (See Comments)    Patient Measurements: Height: 5' 3 (160 cm) Weight: 101.6 kg (223 lb 15.8 oz) IBW/kg (Calculated) : 52.4 HEPARIN  DW (KG): 76.8  Vital Signs: Temp: 98.2 F (36.8 C) (07/17 0800) Temp Source: Oral (07/17 0800) BP: 116/64 (07/17 1100) Pulse Rate: 61 (07/17 1100)  Labs: Recent Labs    10/16/23 1520 10/17/23 0441 10/17/23 1003 10/17/23 1934 10/17/23 2105 10/17/23 2351 10/18/23 0409 10/18/23 0738 10/18/23 1532 10/18/23 2151 10/19/23 0405 10/19/23 0638  HGB 13.9 11.7*  --   --   --   --  9.7*  --   --   --  8.7*  --   HCT 44.3 38.9  --   --   --   --  28.6*  --   --   --  25.3*  --   PLT 301 261  --   --   --   --  182  --   --   --  118*  --   APTT  --   --   --   --    < >  --   --  >200*  --  168*  --  76*  LABPROT 17.3*  --   --   --   --   --   --   --   --   --   --   --   INR 1.3*  --   --   --   --   --   --   --   --   --   --   --   HEPARINUNFRC  --   --   --   --   --   --   --  >1.10*  --   --   --  1.04*  CREATININE 8.32* 8.36*   < >  --   --   --  3.23*  --  2.20*  --  1.45*  --   CKTOTAL 230  --   --   --   --   --   --   --   --   --   --   --   TROPONINIHS 53*  --    < > 123*  --  175* 196*  --   --   --   --   --    < > = values in this interval not displayed.    Estimated Creatinine Clearance: 37.6 mL/min (A) (by C-G formula based on SCr of 1.45 mg/dL (H)).   Medical History: Past Medical History:  Diagnosis Date   Afib (HCC)    Atrial fibrillation (HCC)    Diabetes mellitus without complication (HCC)    DM (diabetes mellitus) (HCC)     Dysrhythmia    HTN (hypertension)    Hypertension    Stroke (HCC) 07/21/12   posterior superior left frontal lobe parenchymal hemorrhage     Medications:  Scheduled:   vitamin C   500 mg Oral BID   atorvastatin   20 mg Oral Daily  Chlorhexidine  Gluconate Cloth  6 each Topical Daily   famotidine   20 mg Oral BID   feeding supplement  237 mL Oral TID BM   insulin  aspart  0-9 Units Subcutaneous Q4H   levETIRAcetam   1,000 mg Intravenous Q12H   multivitamin  1 tablet Oral QHS   thiamine  (VITAMIN B1) injection  100 mg Intravenous QHS   traZODone   50 mg Oral QHS   Infusions:   albumin  human     cefTRIAXone  (ROCEPHIN )  IV Stopped (10/19/23 0001)   heparin  450 Units/hr (10/19/23 0700)   metronidazole  Stopped (10/19/23 0504)   norepinephrine  (LEVOPHED ) Adult infusion 3 mcg/min (10/19/23 0700)   prismasol  BGK 4/2.5 400 mL/hr at 10/19/23 1005   prismasol  BGK 4/2.5 400 mL/hr at 10/19/23 1004   prismasol  BGK 4/2.5 1,500 mL/hr at 10/19/23 9177   vasopressin  Stopped (10/19/23 0251)    Assessment: 78 y.o. female presenting with altered mental status. PMH significant for paroxysmal atrial fibrillation, pacemaker for complete heart block, hypertension, hyperlipidemia, prior CVA, diabetes. She takes apixaban  PTA with last dose 07/15 0155. She was started on IV heparin  but being transitioned to argatroban  ISO thrombocytopenia (HIT score addressed in a separate note)  Baseline Labs: Hgb 11.7, PLT wnl, INR 1.3  Goal of Therapy:  aPTT 50 - 90 seconds Monitor platelets by anticoagulation protocol: Yes   Plan:  ---stop heparin  infusion ---start argatroban  at 0.5 mcg/kg/min ---Check aPTT in 4 after initiation and BID once therapeutic ---Continue to monitor H&H and platelets  Traci Carter 10/19/2023,11:10 AM

## 2023-10-19 NOTE — Progress Notes (Signed)
 PHARMACY - ANTICOAGULATION CONSULT NOTE  Pharmacy Consult for transition from heparin  to argatroban  infusion Indication: atrial fibrillation  Allergies  Allergen Reactions   Codeine Other (See Comments)    GI Distress GI Distress GI Distress   Codeine    Digoxin     Other reaction(s): Unknown   Heparin      Keep in place until HIT r/o   Lactose Intolerance (Gi)    Lanoxin [Digoxin]    Penicillins    Zoloft [Sertraline Hcl]    Penicillins Rash and Other (See Comments)    Patient Measurements: Height: 5' 3 (160 cm) Weight: 101.6 kg (223 lb 15.8 oz) IBW/kg (Calculated) : 52.4 HEPARIN  DW (KG): 76.8  Vital Signs: Temp: 97.5 F (36.4 C) (07/17 1200) Temp Source: Oral (07/17 1200) BP: 136/113 (07/17 1300) Pulse Rate: 70 (07/17 1300)  Labs: Recent Labs    10/17/23 0441 10/17/23 1003 10/17/23 1934 10/17/23 2105 10/17/23 2351 10/18/23 0409 10/18/23 0738 10/18/23 1532 10/18/23 2151 10/19/23 0405 10/19/23 0638 10/19/23 1615  HGB 11.7*  --   --   --   --  9.7*  --   --   --  8.7*  --   --   HCT 38.9  --   --   --   --  28.6*  --   --   --  25.3*  --   --   PLT 261  --   --   --   --  182  --   --   --  118*  --   --   APTT  --   --   --    < >  --   --  >200*  --  168*  --  76* 91*  HEPARINUNFRC  --   --   --   --   --   --  >1.10*  --   --   --  1.04*  --   CREATININE 8.36*   < >  --   --   --  3.23*  --  2.20*  --  1.45*  --   --   TROPONINIHS  --    < > 123*  --  175* 196*  --   --   --   --   --   --    < > = values in this interval not displayed.    Estimated Creatinine Clearance: 37.6 mL/min (A) (by C-G formula based on SCr of 1.45 mg/dL (H)).   Medical History: Past Medical History:  Diagnosis Date   Afib (HCC)    Atrial fibrillation (HCC)    Diabetes mellitus without complication (HCC)    DM (diabetes mellitus) (HCC)    Dysrhythmia    HTN (hypertension)    Hypertension    Stroke (HCC) 07/21/12   posterior superior left frontal lobe parenchymal  hemorrhage     Medications:  Scheduled:   vitamin C   500 mg Oral BID   atorvastatin   20 mg Oral Daily   Chlorhexidine  Gluconate Cloth  6 each Topical Daily   famotidine   20 mg Oral BID   feeding supplement  1 Container Oral TID BM   insulin  aspart  0-9 Units Subcutaneous Q4H   levETIRAcetam   1,000 mg Intravenous Q12H   multivitamin  1 tablet Oral QHS   thiamine  (VITAMIN B1) injection  100 mg Intravenous QHS   traZODone   50 mg Oral QHS   Infusions:   albumin  human Stopped (10/19/23 1444)  anticoagulant sodium citrate      argatroban  0.5 mcg/kg/min (10/19/23 1700)   cefTRIAXone  (ROCEPHIN )  IV Stopped (10/19/23 0001)   metronidazole  100 mL/hr at 10/19/23 1700   norepinephrine  (LEVOPHED ) Adult infusion Stopped (10/19/23 1655)   prismasol  BGK 4/2.5 400 mL/hr at 10/19/23 1005   prismasol  BGK 4/2.5 400 mL/hr at 10/19/23 1004   prismasol  BGK 4/2.5 1,500 mL/hr at 10/19/23 1506   vasopressin  Stopped (10/19/23 0251)    Assessment: 78 y.o. female presenting with altered mental status. PMH significant for paroxysmal atrial fibrillation, pacemaker for complete heart block, hypertension, hyperlipidemia, prior CVA, diabetes. She takes apixaban  PTA with last dose 07/15 0155. She was started on IV heparin  but being transitioned to argatroban  ISO thrombocytopenia (HIT score addressed in a separate note)  Baseline Labs: Hgb 11.7, PLT wnl, INR 1.3  Goal of Therapy:  aPTT 50 - 90 seconds Monitor platelets by anticoagulation protocol: Yes  7/17@1615 : aPTT 91sec, SUPRAtherapeutic   Plan:  ---decrease argatroban  rate to 0.4 mcg/kg/min ---Check aPTT in 4 after rate change and BID once therapeutic ---Continue to monitor H&H and platelets  Jarell Mcewen A Jodey Burbano 10/19/2023,5:09 PM

## 2023-10-19 NOTE — Progress Notes (Signed)
 PHARMACY - ANTICOAGULATION CONSULT NOTE  Pharmacy Consult for transition from heparin  to argatroban  infusion Indication: atrial fibrillation  Allergies  Allergen Reactions   Codeine Other (See Comments)    GI Distress GI Distress GI Distress   Codeine    Digoxin     Other reaction(s): Unknown   Heparin      Keep in place until HIT r/o   Lactose Intolerance (Gi)    Lanoxin [Digoxin]    Penicillins    Zoloft [Sertraline Hcl]    Penicillins Rash and Other (See Comments)    Patient Measurements: Height: 5' 3 (160 cm) Weight: 101.6 kg (223 lb 15.8 oz) IBW/kg (Calculated) : 52.4 HEPARIN  DW (KG): 76.8  Vital Signs: Temp: 98.6 F (37 C) (07/17 2000) Temp Source: Axillary (07/17 2000) BP: 119/81 (07/17 2030) Pulse Rate: 63 (07/17 2030)  Labs: Recent Labs    10/17/23 0441 10/17/23 1003 10/17/23 1934 10/17/23 2105 10/17/23 2351 10/18/23 0409 10/18/23 0738 10/18/23 1532 10/18/23 2151 10/19/23 0405 10/19/23 0638 10/19/23 1615 10/19/23 2051  HGB 11.7*  --   --   --   --  9.7*  --   --   --  8.7*  --   --   --   HCT 38.9  --   --   --   --  28.6*  --   --   --  25.3*  --   --   --   PLT 261  --   --   --   --  182  --   --   --  118*  --   --   --   APTT  --   --   --    < >  --   --  >200*  --    < >  --  76* 91* 75*  HEPARINUNFRC  --   --   --   --   --   --  >1.10*  --   --   --  1.04*  --   --   CREATININE 8.36*   < >  --   --   --  3.23*  --  2.20*  --  1.45*  --  1.08*  --   TROPONINIHS  --    < > 123*  --  175* 196*  --   --   --   --   --   --   --    < > = values in this interval not displayed.    Estimated Creatinine Clearance: 50.4 mL/min (A) (by C-G formula based on SCr of 1.08 mg/dL (H)).   Medical History: Past Medical History:  Diagnosis Date   Afib (HCC)    Atrial fibrillation (HCC)    Diabetes mellitus without complication (HCC)    DM (diabetes mellitus) (HCC)    Dysrhythmia    HTN (hypertension)    Hypertension    Stroke (HCC) 07/21/12    posterior superior left frontal lobe parenchymal hemorrhage     Medications:  Scheduled:   vitamin C   500 mg Oral BID   atorvastatin   20 mg Oral Daily   Chlorhexidine  Gluconate Cloth  6 each Topical Daily   famotidine   20 mg Oral BID   feeding supplement  1 Container Oral TID BM   insulin  aspart  0-9 Units Subcutaneous Q4H   levETIRAcetam   1,000 mg Intravenous Q12H   multivitamin  1 tablet Oral QHS   thiamine  (VITAMIN B1) injection  100  mg Intravenous QHS   traZODone   50 mg Oral QHS   Infusions:   albumin  human Stopped (10/19/23 1444)   anticoagulant sodium citrate      argatroban  0.4 mcg/kg/min (10/19/23 2119)   cefTRIAXone  (ROCEPHIN )  IV Stopped (10/19/23 0001)   metronidazole  Stopped (10/19/23 1738)   norepinephrine  (LEVOPHED ) Adult infusion Stopped (10/19/23 1655)   potassium PHOSPHATE  IVPB (in mmol) 30 mmol (10/19/23 1922)   vasopressin  Stopped (10/19/23 0251)    Assessment: 78 y.o. female presenting with altered mental status. PMH significant for paroxysmal atrial fibrillation, pacemaker for complete heart block, hypertension, hyperlipidemia, prior CVA, diabetes. She takes apixaban  PTA with last dose 07/15 0155. She was started on IV heparin  but being transitioned to argatroban  ISO thrombocytopenia (HIT score addressed in a separate note)  Baseline Labs: Hgb 11.7, PLT wnl, INR 1.3  Goal of Therapy:  aPTT 50 - 90 seconds Monitor platelets by anticoagulation protocol: Yes  7/17@1615 : aPTT 91sec, SUPRAtherapeutic 7/17 2051  aPTT 75 sec, Therapeutic x 1    Plan:  ---continue argatroban  rate at 0.4 mcg/kg/min ---Check confirmatory aPTT in 4 hrs(and BID once therapeutic) ---Continue to monitor H&H and platelets  Allean Haas PharmD Clinical Pharmacist 10/19/2023

## 2023-10-19 NOTE — Progress Notes (Signed)
 [facility]  ROUNDING NOTE   Subjective:   Patient seen laying in bed Alert and oriented Poor oral intake Pressors: Levo Heparin  Gtt CRRT- net even  UOP  Objective:  Vital signs in last 24 hours:  Temp:  [97.8 F (36.6 C)-98.2 F (36.8 C)] 98.2 F (36.8 C) (07/17 0800) Pulse Rate:  [59-92] 64 (07/17 0900) Resp:  [8-22] 13 (07/17 0900) BP: (81-135)/(42-94) 111/69 (07/17 0900) SpO2:  [90 %-100 %] 94 % (07/17 0900) Weight:  [101.6 kg] 101.6 kg (07/17 0311)  Weight change: -1.8 kg Filed Weights   10/18/23 0400 10/18/23 0900 10/19/23 0311  Weight: 103.4 kg 101.6 kg 101.6 kg    Intake/Output: I/O last 3 completed shifts: In: 2609 [I.V.:1872; Other:60; IV Piggyback:677] Out: 3621 [Urine:334]   Intake/Output this shift:  Total I/O In: 0  Out: 115   Physical Exam: General: NAD  Head: Normocephalic, atraumatic. Moist oral mucosal membranes  Eyes: Anicteric  Neck: Supple  Lungs:  Clear to auscultation  Heart: Regular rate and rhythm  Abdomen:  Soft, nontender  Extremities:  No peripheral edema.  Neurologic: Awake, alert, conversant  Skin: Warm,dry, no rash  Access: Rt internal jugular temp HD cath    Basic Metabolic Panel: Recent Labs  Lab 10/16/23 1520 10/17/23 0441 10/17/23 1003 10/17/23 1703 10/18/23 0409 10/18/23 1532 10/19/23 0405  NA 142 136 137 137 137 136 138  K 5.7* 6.2* 4.9 3.6 3.6 3.8 3.5  CL 106 105 103 99 102 102 105  CO2 10* 7* 10* 13* 23 20* 24  GLUCOSE 28* 181* 211* 104* 107* 163* 118*  BUN 91* 93* 96* 69* 42* 30* 20  CREATININE 8.32* 8.36* 8.16* 5.74* 3.23* 2.20* 1.45*  CALCIUM  8.9 7.9* 7.9* 7.6* 6.9* 7.3* 7.5*  MG 1.5* 2.0  --   --  2.1  --  2.3  PHOS  --  9.5*  --  5.4* 3.2 3.0 2.2*    Liver Function Tests: Recent Labs  Lab 10/16/23 1520 10/17/23 0441 10/17/23 1703 10/18/23 0409 10/18/23 1532 10/19/23 0405  AST 32 25  --   --   --   --   ALT 17 15  --   --   --   --   ALKPHOS 83 76  --   --   --   --    BILITOT 0.7 0.8  --   --   --   --   PROT 7.0 5.9*  --   --   --   --   ALBUMIN  3.4* 2.9* 3.1* 2.7* 2.9* 2.6*   No results for input(s): LIPASE, AMYLASE in the last 168 hours. No results for input(s): AMMONIA in the last 168 hours.  CBC: Recent Labs  Lab 10/16/23 1520 10/17/23 0441 10/18/23 0409 10/19/23 0405  WBC 11.8* 13.8* 11.2* 7.0  HGB 13.9 11.7* 9.7* 8.7*  HCT 44.3 38.9 28.6* 25.3*  MCV 100.0 102.6* 93.5 93.0  PLT 301 261 182 118*    Cardiac Enzymes: Recent Labs  Lab 10/16/23 1520  CKTOTAL 230    BNP: Invalid input(s): POCBNP  CBG: Recent Labs  Lab 10/18/23 1537 10/18/23 1933 10/18/23 2306 10/19/23 0308 10/19/23 0825  GLUCAP 158* 140* 138* 103* 100*    Microbiology: Results for orders placed or performed during the hospital encounter of 10/16/23  Urine Culture (for pregnant, neutropenic or urologic patients or patients with an indwelling urinary catheter)     Status: Abnormal   Collection Time: 10/16/23 10:04 PM  Specimen: Urine, Random  Result Value Ref Range Status   Specimen Description   Final    URINE, RANDOM Performed at [facility], [address] [place], [place] [address]    Special Requests   Final    NONE Performed at [facility], [address] [place], [place] [address]    Culture 80,000 COLONIES/mL ESCHERICHIA COLI (A)  Final   Report Status 10/19/2023 FINAL  Final   Organism ID, Bacteria ESCHERICHIA COLI (A)  Final      Susceptibility   Escherichia coli - MIC*    AMPICILLIN >=32 RESISTANT Resistant     CEFAZOLIN  <=4 SENSITIVE Sensitive     CEFEPIME <=0.12 SENSITIVE Sensitive     CEFTRIAXONE  <=0.25 SENSITIVE Sensitive     CIPROFLOXACIN <=0.25 SENSITIVE Sensitive     GENTAMICIN 8 INTERMEDIATE Intermediate     IMIPENEM <=0.25 SENSITIVE Sensitive     NITROFURANTOIN <=16 SENSITIVE Sensitive     TRIMETH/SULFA >=320 RESISTANT Resistant     AMPICILLIN/SULBACTAM 4 SENSITIVE Sensitive     PIP/TAZO <=4  SENSITIVE Sensitive ug/mL    * 80,000 COLONIES/mL ESCHERICHIA COLI  MRSA Next Gen by PCR, Nasal     Status: None   Collection Time: 10/16/23 11:05 PM   Specimen: Nasal Mucosa; Nasal Swab  Result Value Ref Range Status   MRSA by PCR Next Gen NOT DETECTED NOT DETECTED Final    Comment: (NOTE) The GeneXpert MRSA Assay (FDA approved for NASAL specimens only), is one component of a comprehensive MRSA colonization surveillance program. It is not intended to diagnose MRSA infection nor to guide or monitor treatment for MRSA infections. Test performance is not FDA approved in patients less than 20 years old. Performed at [facility], [address] [place], [place] [address]   Resp panel by RT-PCR (RSV, Flu A&B, Covid) Anterior Nasal Swab     Status: None   Collection Time: 10/17/23 10:03 AM   Specimen: Anterior Nasal Swab  Result Value Ref Range Status   SARS Coronavirus 2 by RT PCR NEGATIVE NEGATIVE Final    Comment: (NOTE) SARS-CoV-2 target nucleic acids are NOT DETECTED.  The SARS-CoV-2 RNA is generally detectable in upper respiratory specimens during the acute phase of infection. The lowest concentration of SARS-CoV-2 viral copies this assay can detect is 138 copies/mL. A negative result does not preclude SARS-Cov-2 infection and should not be used as the sole basis for treatment or other patient management decisions. A negative result may occur with  improper specimen collection/handling, submission of specimen other than nasopharyngeal swab, presence of viral mutation(s) within the areas targeted by this assay, and inadequate number of viral copies(<138 copies/mL). A negative result must be combined with clinical observations, patient history, and epidemiological information. The expected result is Negative.  Fact Sheet for Patients:  BloggerCourse.com  Fact Sheet for Healthcare Providers:  SeriousBroker.it  This  test is no t yet approved or cleared by the [place]  FDA and  has been authorized for detection and/or diagnosis of SARS-CoV-2 by FDA under an Emergency Use Authorization (EUA). This EUA will remain  in effect (meaning this test can be used) for the duration of the COVID-19 declaration under Section 564(b)(1) of the Act, 21 U.S.C.section 360bbb-3(b)(1), unless the authorization is terminated  or revoked sooner.       Influenza A by PCR NEGATIVE NEGATIVE Final   Influenza B by PCR NEGATIVE NEGATIVE Final    Comment: (NOTE) The Xpert Xpress SARS-CoV-2/FLU/RSV plus assay is intended  as an aid in the diagnosis of influenza from Nasopharyngeal swab specimens and should not be used as a sole basis for treatment. Nasal washings and aspirates are unacceptable for Xpert Xpress SARS-CoV-2/FLU/RSV testing.  Fact Sheet for Patients: BloggerCourse.com  Fact Sheet for Healthcare Providers: SeriousBroker.it  This test is not yet approved or cleared by the [place]  FDA and has been authorized for detection and/or diagnosis of SARS-CoV-2 by FDA under an Emergency Use Authorization (EUA). This EUA will remain in effect (meaning this test can be used) for the duration of the COVID-19 declaration under Section 564(b)(1) of the Act, 21 U.S.C. section 360bbb-3(b)(1), unless the authorization is terminated or revoked.     Resp Syncytial Virus by PCR NEGATIVE NEGATIVE Final    Comment: (NOTE) Fact Sheet for Patients: BloggerCourse.com  Fact Sheet for Healthcare Providers: SeriousBroker.it  This test is not yet approved or cleared by the [place]  FDA and has been authorized for detection and/or diagnosis of SARS-CoV-2 by FDA under an Emergency Use Authorization (EUA). This EUA will remain in effect (meaning this test can be used) for the duration of the COVID-19 declaration under  Section 564(b)(1) of the Act, 21 U.S.C. section 360bbb-3(b)(1), unless the authorization is terminated or revoked.  Performed at [facility], [address] [place], [place] [address]   Respiratory (~20 pathogens) panel by PCR     Status: None   Collection Time: 10/17/23 10:03 AM   Specimen: Nasopharyngeal Swab; Respiratory  Result Value Ref Range Status   Adenovirus NOT DETECTED NOT DETECTED Final   Coronavirus 229E NOT DETECTED NOT DETECTED Final    Comment: (NOTE) The Coronavirus on the Respiratory Panel, DOES NOT test for the novel  Coronavirus (2019 nCoV)    Coronavirus HKU1 NOT DETECTED NOT DETECTED Final   Coronavirus NL63 NOT DETECTED NOT DETECTED Final   Coronavirus OC43 NOT DETECTED NOT DETECTED Final   Metapneumovirus NOT DETECTED NOT DETECTED Final   Rhinovirus / Enterovirus NOT DETECTED NOT DETECTED Final   Influenza A NOT DETECTED NOT DETECTED Final   Influenza B NOT DETECTED NOT DETECTED Final   Parainfluenza Virus 1 NOT DETECTED NOT DETECTED Final   Parainfluenza Virus 2 NOT DETECTED NOT DETECTED Final   Parainfluenza Virus 3 NOT DETECTED NOT DETECTED Final   Parainfluenza Virus 4 NOT DETECTED NOT DETECTED Final   Respiratory Syncytial Virus NOT DETECTED NOT DETECTED Final   Bordetella pertussis NOT DETECTED NOT DETECTED Final   Bordetella Parapertussis NOT DETECTED NOT DETECTED Final   Chlamydophila pneumoniae NOT DETECTED NOT DETECTED Final   Mycoplasma pneumoniae NOT DETECTED NOT DETECTED Final    Comment: Performed at [facility], [address], [place], [place] [address]  Culture, blood (Routine X 2) w Reflex to ID Panel     Status: None (Preliminary result)   Collection Time: 10/17/23 11:05 AM   Specimen: BLOOD  Result Value Ref Range Status   Specimen Description BLOOD BLOOD RIGHT HAND  Final   Special Requests   Final    BOTTLES DRAWN AEROBIC ONLY Blood Culture results may not be optimal due to an inadequate volume of blood received  in culture bottles   Culture   Final    NO GROWTH 2 DAYS Performed at [facility], [address] [place], [place] [address]    Report Status PENDING  Incomplete    Coagulation Studies: Recent Labs    10/16/23 1520  LABPROT 17.3*  INR 1.3*    Urinalysis: Recent Labs  10/16/23 2204  COLORURINE YELLOW*  LABSPEC 1.010  PHURINE 5.0  GLUCOSEU NEGATIVE  HGBUR SMALL*  BILIRUBINUR NEGATIVE  KETONESUR NEGATIVE  PROTEINUR NEGATIVE  NITRITE NEGATIVE  LEUKOCYTESUR LARGE*      Imaging: US  RENAL Result Date: 10/17/2023 CLINICAL DATA:  Acute renal failure. EXAM: RENAL / URINARY TRACT ULTRASOUND COMPLETE COMPARISON:  November 11, 2004 FINDINGS: Right Kidney: Renal measurements: 9.0 cm x 4.6 cm x 4.8 cm = volume: 103.2 mL. Echogenicity within normal limits. No mass or hydronephrosis visualized. Left Kidney: Renal measurements: 9.4 cm x 4.4 cm x 4.6 cm = volume: 97.6 mL. Mildly increased echogenicity of the renal parenchyma is seen. No mass or hydronephrosis visualized. Bladder: A Foley catheter is in place. Other: None. IMPRESSION: Mildly echogenic left kidney which may represent a normal variant versus sequelae associated with medical renal disease. Electronically Signed   By: Suzen Dials M.D.   On: 10/17/2023 15:52   DG Chest Port 1 View Result Date: 10/17/2023 CLINICAL DATA:  Central line placement EXAM: PORTABLE CHEST 1 VIEW COMPARISON:  Same day chest radiograph and prior studies FINDINGS: Interval placement of right IJ approach central venous catheter with catheter in place with tip at the cavoatrial junction/upper right atrium. Unchanged left IJ approach central venous catheter. Unchanged cardiac pacemaker. Low lung volumes. No pleural effusions. No pneumothorax. Unchanged cardiomediastinal silhouette. IMPRESSION: Interval placement of right IJ approach central venous catheter with tip at the cavoatrial junction/upper right atrium. Unchanged left IJ approach central  venous catheter. No pneumothorax. Electronically Signed   By: Michaeline Blanch M.D.   On: 10/17/2023 12:37     Medications:    cefTRIAXone  (ROCEPHIN )  IV Stopped (10/19/23 0001)   heparin  450 Units/hr (10/19/23 0700)   metronidazole  Stopped (10/19/23 0504)   norepinephrine  (LEVOPHED ) Adult infusion 3 mcg/min (10/19/23 0700)   prismasol  BGK 4/2.5 400 mL/hr at 10/19/23 1005   prismasol  BGK 4/2.5 400 mL/hr at 10/19/23 1004   prismasol  BGK 4/2.5 1,500 mL/hr at 10/19/23 9177   vasopressin  Stopped (10/19/23 0251)    vitamin C   500 mg Oral BID   atorvastatin   20 mg Oral Daily   Chlorhexidine  Gluconate Cloth  6 each Topical Daily   famotidine   20 mg Oral BID   feeding supplement  237 mL Oral TID BM   insulin  aspart  0-9 Units Subcutaneous Q4H   levETIRAcetam   1,000 mg Intravenous Q12H   multivitamin  1 tablet Oral QHS   thiamine  (VITAMIN B1) injection  100 mg Intravenous QHS   traZODone   50 mg Oral QHS   acetaminophen , heparin , loperamide   Assessment/ Plan:  [name] is a 78 y.o.  female with past medical conditions including GERD, CVA, hypertension, diabetes, hyperlipidemia, CAD, gout, obstructive sleep apnea, UTI, chronic kidney disease stage III, who was admitted to [facility] on 10/16/2023 for Altered mental status [R41.82] Altered mental status, unspecified altered mental status type [R41.82]   Acute kidney injury likely secondary to septic UTI. Baseline unknown. UA positive for leukocytes and bacteria. No recent IV contrast exposure. Creatinine on ED arrival 8.32.  CRRT initiated on 7/15, net even. Has produced slightly more urine yesterday, UOP . Has been weaned to one pressor. May consider transitioning to intermittent hemodialysis tomorrow.   Lab Results  Component Value Date   CREATININE 1.45 (H) 10/19/2023   CREATININE 2.20 (H) 10/18/2023   CREATININE 3.23 (H) 10/18/2023    Intake/Output Summary (Last 24 hours) at 10/19/2023 1042 Last data filed at 10/19/2023  1000 Gross per  24 hour  Intake 903.26 ml  Output 1681 ml  Net -777.74 ml   2.  Hyperkalemia/hypomagnesium.  Potassium 3.6.  Magnesium   2.1.  Placed on 4K bath.  Electrolytes corrected.   3.  Acute metabolic acidosis.  Serum bicarb 10 on ED arrival.  Corrected with dialysis and IV supplementation.  Continue management with CRRT for now.     4.  Sepsis likely secondary to UTI with suspected abdominal involvement based on nausea, vomiting, and diarrhea.  UA positive for UTI, urine culture pending.  Patient does have history of E. coli and Enterococcus facilities UTI.  Currently prescribed ceftriaxone , metronidazole .  Vancomycin  stopped    LOS: 3 Traci Carter 7/17/202510:42 AM

## 2023-10-20 LAB — GLUCOSE, CAPILLARY
Glucose-Capillary: 132 mg/dL — ABNORMAL HIGH (ref 70–99)
Glucose-Capillary: 118 mg/dL — ABNORMAL HIGH (ref 70–99)
Glucose-Capillary: 166 mg/dL — ABNORMAL HIGH (ref 70–99)
Glucose-Capillary: 126 mg/dL — ABNORMAL HIGH (ref 70–99)
Glucose-Capillary: 109 mg/dL — ABNORMAL HIGH (ref 70–99)
Glucose-Capillary: 155 mg/dL — ABNORMAL HIGH (ref 70–99)

## 2023-10-20 LAB — RENAL FUNCTION PANEL
Albumin: 3.2 g/dL — ABNORMAL LOW (ref 3.5–5.0)
Anion gap: 10 (ref 5–15)
BUN: 22 mg/dL (ref 8–23)
CO2: 25 mmol/L (ref 22–32)
Calcium: 8.5 mg/dL — ABNORMAL LOW (ref 8.9–10.3)
Chloride: 102 mmol/L (ref 98–111)
Creatinine, Ser: 1.67 mg/dL — ABNORMAL HIGH (ref 0.44–1.00)
GFR, Estimated: 32 mL/min — ABNORMAL LOW (ref >60)
Glucose, Bld: 162 mg/dL — ABNORMAL HIGH (ref 70–99)
Phosphorus: 4 mg/dL (ref 2.5–4.6)
Potassium: 3.3 mmol/L — ABNORMAL LOW (ref 3.5–5.1)
Sodium: 137 mmol/L (ref 135–145)

## 2023-10-20 LAB — CBC
HCT: 26.6 % — ABNORMAL LOW (ref 36.0–46.0)
Hemoglobin: 9.1 g/dL — ABNORMAL LOW (ref 12.0–15.0)
MCH: 32.2 pg (ref 26.0–34.0)
MCHC: 34.2 g/dL (ref 30.0–36.0)
MCV: 94 fL (ref 80.0–100.0)
Platelets: 112 K/uL — ABNORMAL LOW (ref 150–400)
RBC: 2.83 MIL/uL — ABNORMAL LOW (ref 3.87–5.11)
RDW: 13.9 % (ref 11.5–15.5)
WBC: 8 K/uL (ref 4.0–10.5)
nRBC: 0.4 % — ABNORMAL HIGH (ref 0.0–0.2)

## 2023-10-20 LAB — HEPARIN INDUCED PLATELET AB (HIT ANTIBODY): Heparin Induced Plt Ab: 0.098 {OD_unit} (ref 0.000–0.400)

## 2023-10-20 LAB — APTT
aPTT: 98 s — ABNORMAL HIGH (ref 24–36)
aPTT: 100 s — ABNORMAL HIGH (ref 24–36)
aPTT: 81 s — ABNORMAL HIGH (ref 24–36)
aPTT: 72 s — ABNORMAL HIGH (ref 24–36)

## 2023-10-20 LAB — MAGNESIUM: Magnesium: 2.5 mg/dL — ABNORMAL HIGH (ref 1.7–2.4)

## 2023-10-20 MED ORDER — POTASSIUM CHLORIDE CRYS ER 20 MEQ PO TBCR
40.0000 meq | EXTENDED_RELEASE_TABLET | Freq: Once | ORAL | Status: DC
Start: 2023-10-20 — End: 2023-10-20

## 2023-10-20 MED ORDER — POTASSIUM CHLORIDE CRYS ER 20 MEQ PO TBCR
20.0000 meq | EXTENDED_RELEASE_TABLET | Freq: Once | ORAL | Status: AC
  Administered 2023-10-20: 20 meq via ORAL
  Filled 2023-10-20: qty 1

## 2023-10-20 NOTE — Progress Notes (Signed)
 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 Coli and Enterococcus faecalis UTI who presented to the ED with chief complaints of altered mental status.   Per the ED report, the patient's husband is currently admitted to the hospital and was transitioned to comfort care on 10/15/23. The patient's daughter states the patient has continued to decline and has not been eating, drinking, or taking her medications. Her condition worsened over the past two days, with nausea, vomiting, and diarrhea. The patient was noted with worsening confusion and generalized weakness today, which prompted further evaluation in the ED.   ED Course: Initial vital signs showed HR of 68 beats/minute, BP 109/50 mm Hg, the RR 18 breaths/minute, and the oxygen saturation 95 % on RA and a temperature of 97.71F (36.4C). 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 Coli and Enterococcus faecalis UTI  Significant Hospital Events   7/14: Admit to ICU with altered mental status found to be septic due to UTI and probable hypovolemia iso dehydration 10/17/23- patient continued to have worsening renal failure and mental status. Reviewed with  nephrology, started on CRRT today.  VBG with severe acidosis improved post treatment.  Remains severely critically ill.  10/18/23- patient improved, weaning down on vasopressors, renal function improved overnight, +encephalopathy but able to communicate with delayed response.  Remains critically ill.  MRSA neg dc vanco. 10/19/23- patient is able to speak , still a bit confused,  she ate something yesterday but still not totally able to feed herself, remains on low dose levophed  currently at 4mcg, net zero on CRRT,  has Foley with 25cc UOP hourly, right and left neck central lines.  Bloodwork with CBC showing trending down H/h and platelets. HITS workup in process.  10/20/23- patient with mild encephalopathy, V-paced, on regular diet, had soft stools overnight, does have foley with 400UOP overnight, has Left internal jugular TLC and right internal jugular trialysis catheter.  WBC count has resolved post Flagyl /rocephin  for Ecoli infection. Platelets still trending down, we did change over to argatroban  for possible HITS. Renal function is slightly worse today with creat 1.67 from 1.08 yesterday.   Consults:  None  Procedures:  None  Interim History / Subjective:      Micro Data:  7/14: Blood culture x2> 7/14: Urine Culture> 7/14: MRSA PCR>>   Antimicrobials:  Ceftriaxone  7/14 Metronidazole  7/14  OBJECTIVE  Blood pressure 126/70, pulse (!) 59, temperature (!) 96.8 F (36 C), temperature source Axillary, resp. rate 12, height 5' 3 (1.6 m), weight 101.2 kg, SpO2 95%.        Intake/Output Summary (Last 24 hours) at 10/20/2023 0916 Last data filed at 10/20/2023 0800 Gross per 24 hour  Intake 1281.04 ml  Output 1280 ml  Net 1.04 ml   Filed Weights   10/18/23 0900  10/19/23 0311 10/20/23 0300  Weight: 101.6 kg 101.6 kg 101.2 kg    Physical Examination  GENERAL: 78 year-old critically ill patient lying in the bed  EYES: PEERLA. No scleral icterus. Extraocular muscles intact.  HEENT:  Head atraumatic, normocephalic. Oropharynx and nasopharynx clear.  CARDIOVASCULAR:S1, S2 normal. No murmurs, rubs, or gallops.   PULMONARY: Lungs course with wheezes ABDOMINAL: Soft, NTND MUSCULOSKELETAL: No swelling. Normal range of motion. 4/5 strength in all extremities.   NEUROLOGICAL: General: No focal deficit present. She is alert and oriented to person only.  PSYCHIATRIC:  Mood and Affect: Mood normal.  SKIN:Skin is warm and dry. No obvious rash, lesion, or ulcer. Capillary Refill:< 2sec  Labs/imaging that I havepersonally reviewed  (right click and Reselect all SmartList Selections daily)  No results found.   Labs   CBC: Recent Labs  Lab 10/16/23 1520 10/17/23 0441 10/18/23 0409 10/19/23 0405 10/20/23 0431  WBC 11.8* 13.8* 11.2* 7.0 8.0  HGB 13.9 11.7* 9.7* 8.7* 9.1*  HCT 44.3 38.9 28.6* 25.3* 26.6*  MCV 100.0 102.6* 93.5 93.0 94.0  PLT 301 261 182 118* 112*    Basic Metabolic Panel: Recent Labs  Lab 10/16/23 1520 10/17/23 0441 10/17/23 1003 10/18/23 0409 10/18/23 1532 10/19/23 0405 10/19/23 1615 10/20/23 0431  NA 142 136   < > 137 136 138 135 137  K 5.7* 6.2*   < > 3.6 3.8 3.5 3.4* 3.3*  CL 106 105   < > 102 102 105 102 102  CO2 10* 7*   < > 23 20* 24 24 25   GLUCOSE 28* 181*   < > 107* 163* 118* 259* 162*  BUN 91* 93*   < > 42* 30* 20 17 22   CREATININE 8.32* 8.36*   < > 3.23* 2.20* 1.45* 1.08* 1.67*  CALCIUM  8.9 7.9*   < > 6.9* 7.3* 7.5* 8.0* 8.5*  MG 1.5* 2.0  --  2.1  --  2.3  --  2.5*  PHOS  --  9.5*   < > 3.2 3.0 2.2* 1.9* 4.0   < > = values in this interval not displayed.   GFR: Estimated Creatinine Clearance: 32.5 mL/min (A) (by C-G formula based on SCr of 1.67 mg/dL (H)). Recent Labs  Lab 10/17/23 0441 10/17/23 1003 10/17/23 1105 10/17/23 2105 10/17/23 2351 10/18/23 0409 10/19/23 0405 10/20/23 0431  WBC 13.8*  --   --   --   --  11.2* 7.0 8.0  LATICACIDVEN  --    < > 8.8* 5.2* 3.6* 2.0*  --   --    < > = values in this interval not  displayed.   Liver Function Tests: Recent Labs  Lab 10/16/23 1520 10/17/23 0441 10/17/23 1703 10/18/23 0409 10/18/23 1532 10/19/23 0405 10/19/23 1615 10/20/23 0431  AST 32 25  --   --   --   --   --   --   ALT 17 15  --   --   --   --   --   --   ALKPHOS 83 76  --   --   --   --   --   --   BILITOT 0.7 0.8  --   --   --   --   --   --   PROT 7.0 5.9*  --   --   --   --   --   --   ALBUMIN  3.4* 2.9*   < > 2.7* 2.9* 2.6* 3.3* 3.2*   < > =  values in this interval not displayed.   No results for input(s): LIPASE, AMYLASE in the last 168 hours. No results for input(s): AMMONIA in the last 168 hours.  ABG    Component Value Date/Time   PHART 7.03 (LL) 10/17/2023 0746   PCO2ART 25 (L) 10/17/2023 0746   PO2ART 98 10/17/2023 0746   HCO3 22.7 10/18/2023 0409   ACIDBASEDEF 3.5 (H) 10/18/2023 0409   O2SAT 74.8 10/18/2023 0409    Coagulation Profile: Recent Labs  Lab 10/16/23 1520  INR 1.3*   Cardiac Enzymes: Recent Labs  Lab 10/16/23 1520  CKTOTAL 230   HbA1C: Hgb A1c MFr Bld  Date/Time Value Ref Range Status  10/17/2023 04:41 AM 5.7 (H) 4.8 - 5.6 % Final    Comment:    (NOTE)         Prediabetes: 5.7 - 6.4         Diabetes: >6.4         Glycemic control for adults with diabetes: <7.0   01/17/2021 04:43 AM 11.5 (H) 4.8 - 5.6 % Final    Comment:    (NOTE)         Prediabetes: 5.7 - 6.4         Diabetes: >6.4         Glycemic control for adults with diabetes: <7.0    CBG: Recent Labs  Lab 10/19/23 1626 10/19/23 1916 10/19/23 2308 10/20/23 0305 10/20/23 0731  GLUCAP 236* 184* 217* 132* 118*   Review of Systems:   Unable to be obtained secondary to the patient's altered mental status.   Past Medical History  She,  has a past medical history of Afib (HCC), Atrial fibrillation (HCC), Diabetes mellitus without complication (HCC), DM (diabetes mellitus) (HCC), Dysrhythmia, HTN (hypertension), Hypertension, and Stroke (HCC) (07/21/12).   Surgical  History    Past Surgical History:  Procedure Laterality Date   ABDOMINAL HYSTERECTOMY     CARPAL TUNNEL RELEASE     COLONOSCOPY  01/02/2009   Dr Dessa diverticulosis.   COLONOSCOPY WITH PROPOFOL  N/A 06/28/2017   Procedure: COLONOSCOPY WITH PROPOFOL ;  Surgeon: Dessa Reyes ORN, MD;  Location: [facility] ENDOSCOPY;  Service: Endoscopy;  Laterality: N/A;   ELBOW SURGERY     INSERT / REPLACE / REMOVE PACEMAKER     Dr Ammon   JOINT REPLACEMENT  2005   knee   SHOULDER ARTHROSCOPY       Social History   reports that she has never smoked. She has never used smokeless tobacco. She reports that she does not drink alcohol and does not use drugs.   Family History   Her family history includes Breast cancer in her mother; Prostate cancer in her father.   Allergies Allergies  Allergen Reactions   Codeine Other (See Comments)    GI Distress GI Distress GI Distress   Codeine    Digoxin     Other reaction(s): Unknown   Heparin      Keep in place until HIT r/o   Lactose Intolerance (Gi)    Lanoxin [Digoxin]    Penicillins    Zoloft [Sertraline Hcl]    Penicillins Rash and Other (See Comments)     Home Medications  Prior to Admission medications   Medication Sig Start Date End Date Taking? Authorizing Provider  acetaminophen  (TYLENOL ) 325 MG tablet Take 2 tablets (650 mg total) by mouth every 6 (six) hours as needed for mild pain, headache, fever or moderate pain. 09/21/20   Elgergawy, Brayton RAMAN, MD  albuterol  (  PROVENTIL ) (2.5 MG/3ML) 0.083% nebulizer solution Inhale 3 mLs into the lungs every 4 (four) hours as needed for wheezing or shortness of breath. 09/21/20   Elgergawy, Brayton RAMAN, MD  albuterol  (VENTOLIN  HFA) 108 (90 Base) MCG/ACT inhaler Inhale 2 puffs into the lungs every 6 (six) hours as needed for wheezing or shortness of breath. 08/12/20   [provider]  allopurinol  (ZYLOPRIM ) 100 MG tablet Take 100 mg by mouth daily. 11/28/20   [provider]  atorvastatin   (LIPITOR) 20 MG tablet Take 20 mg by mouth daily.    [provider]  colchicine  0.6 MG tablet Take 1 tablet (0.6 mg total) by mouth daily. 02/11/21   Jens Durand, MD  ELIQUIS  5 MG TABS tablet Take 5 mg by mouth 2 (two) times daily. 01/07/21   [provider]  famotidine  (PEPCID ) 20 MG tablet Take 20 mg by mouth 2 (two) times daily.    [provider]  furosemide  (LASIX ) 20 MG tablet Take 1 tablet (20 mg total) by mouth daily as needed (weight gain >3 lbs in 1 day and >5 lbs in 2 days). 10/20/20   Tobie Yetta HERO, MD  glipiZIDE  (GLUCOTROL  XL) 5 MG 24 hr tablet Take 5 mg by mouth daily. 01/04/21   [provider]  insulin  aspart (NOVOLOG ) 100 UNIT/ML injection Inject 0-9 Units into the skin 3 (three) times daily with meals. 09/21/20   Elgergawy, Brayton RAMAN, MD  levETIRAcetam  (KEPPRA ) 1000 MG tablet Take 1,000 mg by mouth 2 (two) times daily. 01/08/21   [provider]  lisinopril  (ZESTRIL ) 5 MG tablet Take 5 mg by mouth daily. 11/07/20   [provider]  metFORMIN  (GLUCOPHAGE ) 1000 MG tablet Take 1,000 mg by mouth 2 (two) times daily. 11/28/20   [provider]  polyethylene glycol (MIRALAX  / GLYCOLAX ) 17 g packet Take 17 g by mouth 2 (two) times daily. 10/20/20   Tobie Yetta HERO, MD  Scheduled Meds:  vitamin C   500 mg Oral BID   atorvastatin   20 mg Oral Daily   Chlorhexidine  Gluconate Cloth  6 each Topical Daily   famotidine   20 mg Oral BID   feeding supplement  1 Container Oral TID BM   insulin  aspart  0-9 Units Subcutaneous Q4H   levETIRAcetam   1,000 mg Intravenous Q12H   multivitamin  1 tablet Oral QHS   potassium chloride   20 mEq Oral Once   thiamine  (VITAMIN B1) injection  100 mg Intravenous QHS   traZODone   50 mg Oral QHS   Continuous Infusions:  albumin  human Stopped (10/19/23 1444)   anticoagulant sodium citrate      argatroban  0.29 mcg/kg/min (10/20/23 0800)   cefTRIAXone  (ROCEPHIN )  IV Stopped (10/19/23 2341)   metronidazole   Stopped (10/20/23 0527)   norepinephrine  (LEVOPHED ) Adult infusion Stopped (10/19/23 1655)   vasopressin  Stopped (10/19/23 0251)   PRN Meds:.acetaminophen , anticoagulant sodium citrate , loperamide   Active Hospital Problem list   See systems below  Assessment & Plan:  #Septic shock - PRESENT ON ADMISSION - due to UTI and probable intrabdominal source (nv and diarrhea) Hx of E. Coli and Enterococcus faecalis UTI  Presenting with altered mental status, UA positive for UTI -F/u cultures, trend lactic/ PCT -Monitor WBC/ fever curve -Continue IV abx pending cultures -GI panel pending -IVF hydration as needed -Pressors for MAP goal >65 -Strict I/O's    #AKI on CKD stage III: AKI likely iso above #AGMA #Hyperkalemia #Hypomagnesium -Monitor I&O's / urinary output -Follow BMP -Ensure adequate renal perfusion -Avoid  nephrotoxic agents as able -Replace electrolytes as indicated  #Paroxysmal Afib  Dual-chamber pacemaker for complete heart block #HTN:  #HLD:  -Hold home lisinopril .  -Continue Eliquis  -Continued home statin -Replete K<4, Mg<2   #Hx of seizures -Continued home keppra , will switch to IV while npo -PRN Lorazepam  for breakthrough seizure -seizure precaution  #Anxiety and Depression -No home meds   #T2DM #Peripheral neuropathy: In s/o diabetes history. Unclear if still taking lyrica  or Cymbalta   Presenting with hypoglycemia 23 -CBG's q2 -Target range of 140 to 180, avoid hypoglycemia -Hold SSI and Lantus  until stable -Follow ICU Hypo/Hyperglycemia protocol -Hold home Glipizide  and Metformin   #Chronic Gout   #FTT #Falls Risk She uses a walker at baseline and will use a wheelchair which she goes to the doctor's office. Recent multiple falls with decreased po intake -PT/OT -TOC consult for SNF placement consideration -Dietary consult -Palliative consult  #OSA not on CPAP Remote hx of Asthma/COPD -Duonebs scheduled and PRN  Best practice:  Diet:   Oral Pain/Anxiety/Delirium protocol (if indicated): No VAP protocol (if indicated): Not indicated DVT prophylaxis: Systemic AC GI prophylaxis: H2B Glucose control:  SSI Yes Central venous access:  N/A Arterial line:  N/A Foley:  N/A Mobility:  bed rest  PT consulted: Yes Last date of multidisciplinary goals of care discussion []  Code Status:  full code Disposition: ICU   = Goals of Care = Code Status Order: FULL  Primary Emergency Contact: Williams,Christy Wishes to pursue full aggressive treatment and intervention options, including CPR and intubation, but goals of care will be addressed on going with family if that should become necessary.  Critical care provider statement:   Total critical care time: 33 minutes   Performed by: Parris MD   Critical care time was exclusive of separately billable procedures and treating other patients.   Critical care was necessary to treat or prevent imminent or life-threatening deterioration.   Critical care was time spent personally by me on the following activities: development of treatment plan with patient and/or surrogate as well as nursing, discussions with consultants, evaluation of patient's response to treatment, examination of patient, obtaining history from patient or surrogate, ordering and performing treatments and interventions, ordering and review of laboratory studies, ordering and review of radiographic studies, pulse oximetry and re-evaluation of patient's condition.    Gerome Kokesh, M.D.  Pulmonary & Critical Care Medicine

## 2023-10-20 NOTE — Progress Notes (Signed)
 PHARMACY - ANTICOAGULATION CONSULT NOTE  Pharmacy Consult for transition from heparin  to argatroban  infusion Indication: atrial fibrillation  Allergies  Allergen Reactions   Codeine Other (See Comments)    GI Distress GI Distress GI Distress   Codeine    Digoxin     Other reaction(s): Unknown   Heparin      Keep in place until HIT r/o   Lactose Intolerance (Gi)    Lanoxin [Digoxin]    Penicillins    Zoloft [Sertraline Hcl]    Penicillins Rash and Other (See Comments)    Patient Measurements: Height: 5' 3 (160 cm) Weight: 101.2 kg (223 lb 1.7 oz) IBW/kg (Calculated) : 52.4 HEPARIN  DW (KG): 76.8  Vital Signs: Temp: 97.6 F (36.4 C) (07/18 0400) Temp Source: Axillary (07/18 0400) BP: 146/100 (07/18 0500) Pulse Rate: 77 (07/18 0500)  Labs: Recent Labs    10/17/23 1703 10/17/23 1934 10/17/23 2105 10/17/23 2351 10/18/23 0409 10/18/23 0738 10/18/23 1532 10/19/23 0405 10/19/23 9361 10/19/23 1615 10/19/23 2051 10/20/23 0053 10/20/23 0431  HGB   < >  --   --   --  9.7*  --   --  8.7*  --   --   --   --  9.1*  HCT  --   --   --   --  28.6*  --   --  25.3*  --   --   --   --  26.6*  PLT  --   --   --   --  182  --   --  118*  --   --   --   --  112*  APTT  --   --    < >  --   --  >200*   < >  --  76* 91* 75* 98* 100*  HEPARINUNFRC  --   --   --   --   --  >1.10*  --   --  1.04*  --   --   --   --   CREATININE  --   --   --   --  3.23*  --    < > 1.45*  --  1.08*  --   --  1.67*  TROPONINIHS  --  123*  --  175* 196*  --   --   --   --   --   --   --   --    < > = values in this interval not displayed.    Estimated Creatinine Clearance: 32.5 mL/min (A) (by C-G formula based on SCr of 1.67 mg/dL (H)).   Medical History: Past Medical History:  Diagnosis Date   Afib (HCC)    Atrial fibrillation (HCC)    Diabetes mellitus without complication (HCC)    DM (diabetes mellitus) (HCC)    Dysrhythmia    HTN (hypertension)    Hypertension    Stroke (HCC) 07/21/12    posterior superior left frontal lobe parenchymal hemorrhage     Medications:  Scheduled:   vitamin C   500 mg Oral BID   atorvastatin   20 mg Oral Daily   Chlorhexidine  Gluconate Cloth  6 each Topical Daily   famotidine   20 mg Oral BID   feeding supplement  1 Container Oral TID BM   insulin  aspart  0-9 Units Subcutaneous Q4H   levETIRAcetam   1,000 mg Intravenous Q12H   multivitamin  1 tablet Oral QHS   thiamine  (VITAMIN B1) injection  100 mg Intravenous  QHS   traZODone   50 mg Oral QHS   Infusions:   albumin  human Stopped (10/19/23 1444)   anticoagulant sodium citrate      argatroban  0.29 mcg/kg/min (10/20/23 0519)   cefTRIAXone  (ROCEPHIN )  IV Stopped (10/19/23 2341)   metronidazole  100 mL/hr at 10/20/23 0505   norepinephrine  (LEVOPHED ) Adult infusion Stopped (10/19/23 1655)   vasopressin  Stopped (10/19/23 0251)    Assessment: 78 y.o. female presenting with altered mental status. PMH significant for paroxysmal atrial fibrillation, pacemaker for complete heart block, hypertension, hyperlipidemia, prior CVA, diabetes. She takes apixaban  PTA with last dose 07/15 0155. She was started on IV heparin  but being transitioned to argatroban  ISO thrombocytopenia (HIT score addressed in a separate note)  Baseline Labs: Hgb 11.7, PLT wnl, INR 1.3  Goal of Therapy:  aPTT 50 - 90 seconds Monitor platelets by anticoagulation protocol: Yes  7/17@1615 : aPTT 91sec, SUPRAtherapeutic 7/17 2051  aPTT 75 sec, Therapeutic x 1  7/18 0053  aPTT 98 sec, SUPRAtherapeutic  7/18 0431  aPTT 100 sec, SUPRAtherapeutic  Plan:  7/18:  aPTT @ 0431 = 100 sec, SUPRAtherapeutic - will decrease argatroban  infusion to 1.77 ml/hr (0.29 mcg/kg/min) and recheck aPTT 4 hrs after rate change - check aPTT Q12H once therapeutic X 2   Platelets @ 0431 = 112, still dropping from previous value of 118  -Continue to monitor H&H and platelets  Traci Carter D Clinical Pharmacist 10/20/2023

## 2023-10-20 NOTE — Consult Note (Signed)
 PHARMACY CONSULT NOTE - ELECTROLYTES  Pharmacy Consult for Electrolyte Monitoring and Replacement   Recent Labs: Height: 5' 3 (160 cm) Weight: 101.2 kg (223 lb 1.7 oz) IBW/kg (Calculated) : 52.4 Estimated Creatinine Clearance: 32.5 mL/min (A) (by C-G formula based on SCr of 1.67 mg/dL (H)). Potassium (mmol/L)  Date Value  10/20/2023 3.3 (L)  08/02/2012 3.6   Magnesium  (mg/dL)  Date Value  92/81/7974 2.5 (H)   Calcium  (mg/dL)  Date Value  92/81/7974 8.5 (L)   Calcium , Total (mg/dL)  Date Value  94/98/7985 9.3   Albumin  (g/dL)  Date Value  92/81/7974 3.2 (L)  07/21/2012 3.6   Phosphorus (mg/dL)  Date Value  92/81/7974 4.0   Sodium (mmol/L)  Date Value  10/20/2023 137  08/02/2012 138    Assessment  Traci Carter is a 78 y.o. female presenting with altered mental status. PMH significant for paroxysmal atrial fibrillation, pacemaker for complete heart block, hypertension, hyperlipidemia, prior CVA, diabetes. Pharmacy has been consulted to monitor and replace electrolytes while under PCCM care. CRRT started 07/15, stopped 7/17  Goal of Therapy: Electrolytes WNL  Plan: 20 mEq po KCl x 1 Recheck electrolytes in am  Thank you for allowing pharmacy to be a part of this patient's care.  [name], PharmD Clinical Pharmacist 10/20/2023 7:52 AM

## 2023-10-20 NOTE — Progress Notes (Signed)
 PHARMACY - ANTICOAGULATION CONSULT NOTE  Pharmacy Consult for argatroban  infusion Indication: atrial fibrillation  Allergies  Allergen Reactions   Codeine Other (See Comments)    GI Distress GI Distress GI Distress   Codeine    Digoxin     Other reaction(s): Unknown   Heparin      Keep in place until HIT r/o   Lactose Intolerance (Gi)    Lanoxin [Digoxin]    Penicillins    Zoloft [Sertraline Hcl]    Penicillins Rash and Other (See Comments)    Patient Measurements: Height: 5' 3 (160 cm) Weight: 101.2 kg (223 lb 1.7 oz) IBW/kg (Calculated) : 52.4 HEPARIN  DW (KG): 76.8  Vital Signs: Temp: 97.8 F (36.6 C) (07/18 1200) Temp Source: Oral (07/18 1200) BP: 116/64 (07/18 1300) Pulse Rate: 65 (07/18 1300)  Labs: Recent Labs    10/17/23 1703 10/17/23 1934 10/17/23 2105 10/17/23 2351 10/18/23 0409 10/18/23 0738 10/18/23 1532 10/19/23 0405 10/19/23 9361 10/19/23 1615 10/19/23 2051 10/20/23 0053 10/20/23 0431 10/20/23 0822  HGB   < >  --   --   --  9.7*  --   --  8.7*  --   --   --   --  9.1*  --   HCT  --   --   --   --  28.6*  --   --  25.3*  --   --   --   --  26.6*  --   PLT  --   --   --   --  182  --   --  118*  --   --   --   --  112*  --   APTT  --   --    < >  --   --  >200*   < >  --  76* 91*   < > 98* 100* 81*  HEPARINUNFRC  --   --   --   --   --  >1.10*  --   --  1.04*  --   --   --   --   --   CREATININE  --   --   --   --  3.23*  --    < > 1.45*  --  1.08*  --   --  1.67*  --   TROPONINIHS  --  123*  --  175* 196*  --   --   --   --   --   --   --   --   --    < > = values in this interval not displayed.    Estimated Creatinine Clearance: 32.5 mL/min (A) (by C-G formula based on SCr of 1.67 mg/dL (H)).   Medical History: Past Medical History:  Diagnosis Date   Afib (HCC)    Atrial fibrillation (HCC)    Diabetes mellitus without complication (HCC)    DM (diabetes mellitus) (HCC)    Dysrhythmia    HTN (hypertension)    Hypertension     Stroke (HCC) 07/21/12   posterior superior left frontal lobe parenchymal hemorrhage     Medications:  Scheduled:   atorvastatin   20 mg Oral Daily   Chlorhexidine  Gluconate Cloth  6 each Topical Daily   famotidine   20 mg Oral BID   feeding supplement  1 Container Oral TID BM   insulin  aspart  0-9 Units Subcutaneous Q4H   levETIRAcetam   1,000 mg Intravenous Q12H   traZODone   50 mg Oral  QHS   Infusions:   anticoagulant sodium citrate      argatroban  0.29 mcg/kg/min (10/20/23 1200)   cefTRIAXone  (ROCEPHIN )  IV Stopped (10/19/23 2341)   metronidazole  Stopped (10/20/23 9472)    Assessment: 78 y.o. female presenting with altered mental status. PMH significant for paroxysmal atrial fibrillation, pacemaker for complete heart block, hypertension, hyperlipidemia, prior CVA, diabetes. She takes apixaban  PTA with last dose 07/15 0155. She was started on IV heparin  but being transitioned to argatroban  ISO thrombocytopenia (HIT score addressed in a separate note)  Baseline Labs: Hgb 11.7, PLT wnl, INR 1.3  Goal of Therapy:  aPTT 50 - 90 seconds Monitor platelets by anticoagulation protocol: Yes  Plan: aPTT therapeutic x 2 - continue argatroban  infusion at 1.77 ml/hr (0.29 mcg/kg/min)  - check aPTT Q12H   [name] Clinical Pharmacist 10/20/2023

## 2023-10-20 NOTE — Progress Notes (Signed)
 Traci Carter is being treated in our ICU from 10/16/2023 to current. Please excuse any work absence for family related to this admission.   Treatment Team:  Attending Provider: Parris Manna, MD Attending Physician: Pccm, Fenton, MD

## 2023-10-20 NOTE — Progress Notes (Signed)
 [facility]  ROUNDING NOTE   Subjective:   Patient seen laying in bed Alert and oriented to self.  Remains confused to details Poor oral intake persist, very little breakfast consumed at bedside Pressors: Weaned Argatroban  drip  Creatinine 1.67 UOP  Objective:  Vital signs in last 24 hours:  Temp:  [96.8 F (36 C)-98.6 F (37 C)] 97.8 F (36.6 C) (07/18 1200) Pulse Rate:  [54-92] 65 (07/18 1300) Resp:  [11-23] 16 (07/18 1300) BP: (91-146)/(52-110) 116/64 (07/18 1300) SpO2:  [66 %-99 %] 97 % (07/18 1300) Weight:  [101.2 kg] 101.2 kg (07/18 0300)  Weight change: -0.4 kg Filed Weights   10/18/23 0900 10/19/23 0311 10/20/23 0300  Weight: 101.6 kg 101.6 kg 101.2 kg    Intake/Output: I/O last 3 completed shifts: In: 1859.5 [P.O.:429; I.V.:222.6; Other:20; IV Piggyback:1187.9] Out: 2024 [Urine:1055]   Intake/Output this shift:  Total I/O In: 300 [P.O.:240; I.V.:12.3; IV Piggyback:47.7] Out: 500 [Urine:500]  Physical Exam: General: NAD  Head: Normocephalic, atraumatic. Moist oral mucosal membranes  Eyes: Anicteric  Neck: Supple  Lungs:  Clear to auscultation  Heart: Regular rate and rhythm  Abdomen:  Soft, nontender  Extremities:  No peripheral edema.  Neurologic: Awake, alert, conversant  Skin: Warm,dry, no rash  Access: Rt internal jugular temp HD cath    Basic Metabolic Panel: Recent Labs  Lab 10/16/23 1520 10/17/23 0441 10/17/23 1003 10/18/23 0409 10/18/23 1532 10/19/23 0405 10/19/23 1615 10/20/23 0431  NA 142 136   < > 137 136 138 135 137  K 5.7* 6.2*   < > 3.6 3.8 3.5 3.4* 3.3*  CL 106 105   < > 102 102 105 102 102  CO2 10* 7*   < > 23 20* 24 24 25   GLUCOSE 28* 181*   < > 107* 163* 118* 259* 162*  BUN 91* 93*   < > 42* 30* 20 17 22   CREATININE 8.32* 8.36*   < > 3.23* 2.20* 1.45* 1.08* 1.67*  CALCIUM  8.9 7.9*   < > 6.9* 7.3* 7.5* 8.0* 8.5*  MG 1.5* 2.0  --  2.1  --  2.3  --  2.5*  PHOS  --  9.5*   < > 3.2 3.0 2.2* 1.9* 4.0    < > = values in this interval not displayed.    Liver Function Tests: Recent Labs  Lab 10/16/23 1520 10/17/23 0441 10/17/23 1703 10/18/23 0409 10/18/23 1532 10/19/23 0405 10/19/23 1615 10/20/23 0431  AST 32 25  --   --   --   --   --   --   ALT 17 15  --   --   --   --   --   --   ALKPHOS 83 76  --   --   --   --   --   --   BILITOT 0.7 0.8  --   --   --   --   --   --   PROT 7.0 5.9*  --   --   --   --   --   --   ALBUMIN  3.4* 2.9*   < > 2.7* 2.9* 2.6* 3.3* 3.2*   < > = values in this interval not displayed.   No results for input(s): LIPASE, AMYLASE in the last 168 hours. No results for input(s): AMMONIA in the last 168 hours.  CBC: Recent Labs  Lab 10/16/23 1520 10/17/23 0441 10/18/23 0409 10/19/23 0405 10/20/23 0431  WBC 11.8* 13.8*  11.2* 7.0 8.0  HGB 13.9 11.7* 9.7* 8.7* 9.1*  HCT 44.3 38.9 28.6* 25.3* 26.6*  MCV 100.0 102.6* 93.5 93.0 94.0  PLT 301 261 182 118* 112*    Cardiac Enzymes: Recent Labs  Lab 10/16/23 1520  CKTOTAL 230    BNP: Invalid input(s): POCBNP  CBG: Recent Labs  Lab 10/19/23 1916 10/19/23 2308 10/20/23 0305 10/20/23 0731 10/20/23 1144  GLUCAP 184* 217* 132* 118* 166*    Microbiology: Results for orders placed or performed during the hospital encounter of 10/16/23  Urine Culture (for pregnant, neutropenic or urologic patients or patients with an indwelling urinary catheter)     Status: Abnormal   Collection Time: 10/16/23 10:04 PM   Specimen: Urine, Random  Result Value Ref Range Status   Specimen Description   Final    URINE, RANDOM Performed at [facility], [address], [place], [place] [address]    Special Requests   Final    NONE Performed at [facility], [address] [place], [place] [address]    Culture 80,000 COLONIES/mL ESCHERICHIA COLI (A)  Final   Report Status 10/19/2023 FINAL  Final   Organism ID, Bacteria ESCHERICHIA COLI (A)  Final      Susceptibility    Escherichia coli - MIC*    AMPICILLIN >=32 RESISTANT Resistant     CEFAZOLIN  <=4 SENSITIVE Sensitive     CEFEPIME <=0.12 SENSITIVE Sensitive     CEFTRIAXONE  <=0.25 SENSITIVE Sensitive     CIPROFLOXACIN <=0.25 SENSITIVE Sensitive     GENTAMICIN 8 INTERMEDIATE Intermediate     IMIPENEM <=0.25 SENSITIVE Sensitive     NITROFURANTOIN <=16 SENSITIVE Sensitive     TRIMETH/SULFA >=320 RESISTANT Resistant     AMPICILLIN/SULBACTAM 4 SENSITIVE Sensitive     PIP/TAZO <=4 SENSITIVE Sensitive ug/mL    * 80,000 COLONIES/mL ESCHERICHIA COLI  MRSA Next Gen by PCR, Nasal     Status: None   Collection Time: 10/16/23 11:05 PM   Specimen: Nasal Mucosa; Nasal Swab  Result Value Ref Range Status   MRSA by PCR Next Gen NOT DETECTED NOT DETECTED Final    Comment: (NOTE) The GeneXpert MRSA Assay (FDA approved for NASAL specimens only), is one component of a comprehensive MRSA colonization surveillance program. It is not intended to diagnose MRSA infection nor to guide or monitor treatment for MRSA infections. Test performance is not FDA approved in patients less than 79 years old. Performed at [facility], [address] [place], [place] [address]   Resp panel by RT-PCR (RSV, Flu A&B, Covid) Anterior Nasal Swab     Status: None   Collection Time: 10/17/23 10:03 AM   Specimen: Anterior Nasal Swab  Result Value Ref Range Status   SARS Coronavirus 2 by RT PCR NEGATIVE NEGATIVE Final    Comment: (NOTE) SARS-CoV-2 target nucleic acids are NOT DETECTED.  The SARS-CoV-2 RNA is generally detectable in upper respiratory specimens during the acute phase of infection. The lowest concentration of SARS-CoV-2 viral copies this assay can detect is 138 copies/mL. A negative result does not preclude SARS-Cov-2 infection and should not be used as the sole basis for treatment or other patient management decisions. A negative result may occur with  improper specimen collection/handling, submission of specimen  other than nasopharyngeal swab, presence of viral mutation(s) within the areas targeted by this assay, and inadequate number of viral copies(<138 copies/mL). A negative result must be combined with clinical observations, patient history, and epidemiological information. The expected result is Negative.  Fact Sheet for Patients:  BloggerCourse.com  Fact Sheet for Healthcare Providers:  SeriousBroker.it  This test is no t yet approved or cleared by the [place]  FDA and  has been authorized for detection and/or diagnosis of SARS-CoV-2 by FDA under an Emergency Use Authorization (EUA). This EUA will remain  in effect (meaning this test can be used) for the duration of the COVID-19 declaration under Section 564(b)(1) of the Act, 21 U.S.C.section 360bbb-3(b)(1), unless the authorization is terminated  or revoked sooner.       Influenza A by PCR NEGATIVE NEGATIVE Final   Influenza B by PCR NEGATIVE NEGATIVE Final    Comment: (NOTE) The Xpert Xpress SARS-CoV-2/FLU/RSV plus assay is intended as an aid in the diagnosis of influenza from Nasopharyngeal swab specimens and should not be used as a sole basis for treatment. Nasal washings and aspirates are unacceptable for Xpert Xpress SARS-CoV-2/FLU/RSV testing.  Fact Sheet for Patients: BloggerCourse.com  Fact Sheet for Healthcare Providers: SeriousBroker.it  This test is not yet approved or cleared by the [place]  FDA and has been authorized for detection and/or diagnosis of SARS-CoV-2 by FDA under an Emergency Use Authorization (EUA). This EUA will remain in effect (meaning this test can be used) for the duration of the COVID-19 declaration under Section 564(b)(1) of the Act, 21 U.S.C. section 360bbb-3(b)(1), unless the authorization is terminated or revoked.     Resp Syncytial Virus by PCR NEGATIVE NEGATIVE Final     Comment: (NOTE) Fact Sheet for Patients: BloggerCourse.com  Fact Sheet for Healthcare Providers: SeriousBroker.it  This test is not yet approved or cleared by the [place]  FDA and has been authorized for detection and/or diagnosis of SARS-CoV-2 by FDA under an Emergency Use Authorization (EUA). This EUA will remain in effect (meaning this test can be used) for the duration of the COVID-19 declaration under Section 564(b)(1) of the Act, 21 U.S.C. section 360bbb-3(b)(1), unless the authorization is terminated or revoked.  Performed at [facility], [address] [place], [place] [address]   Respiratory (~20 pathogens) panel by PCR     Status: None   Collection Time: 10/17/23 10:03 AM   Specimen: Nasopharyngeal Swab; Respiratory  Result Value Ref Range Status   Adenovirus NOT DETECTED NOT DETECTED Final   Coronavirus 229E NOT DETECTED NOT DETECTED Final    Comment: (NOTE) The Coronavirus on the Respiratory Panel, DOES NOT test for the novel  Coronavirus (2019 nCoV)    Coronavirus HKU1 NOT DETECTED NOT DETECTED Final   Coronavirus NL63 NOT DETECTED NOT DETECTED Final   Coronavirus OC43 NOT DETECTED NOT DETECTED Final   Metapneumovirus NOT DETECTED NOT DETECTED Final   Rhinovirus / Enterovirus NOT DETECTED NOT DETECTED Final   Influenza A NOT DETECTED NOT DETECTED Final   Influenza B NOT DETECTED NOT DETECTED Final   Parainfluenza Virus 1 NOT DETECTED NOT DETECTED Final   Parainfluenza Virus 2 NOT DETECTED NOT DETECTED Final   Parainfluenza Virus 3 NOT DETECTED NOT DETECTED Final   Parainfluenza Virus 4 NOT DETECTED NOT DETECTED Final   Respiratory Syncytial Virus NOT DETECTED NOT DETECTED Final   Bordetella pertussis NOT DETECTED NOT DETECTED Final   Bordetella Parapertussis NOT DETECTED NOT DETECTED Final   Chlamydophila pneumoniae NOT DETECTED NOT DETECTED Final   Mycoplasma pneumoniae NOT DETECTED NOT DETECTED  Final    Comment: Performed at [facility], [address], [place], [place] [address]  Culture, blood (Routine X 2) w Reflex to ID Panel  Status: None (Preliminary result)   Collection Time: 10/17/23 11:05 AM   Specimen: BLOOD  Result Value Ref Range Status   Specimen Description BLOOD BLOOD RIGHT HAND  Final   Special Requests   Final    BOTTLES DRAWN AEROBIC ONLY Blood Culture results may not be optimal due to an inadequate volume of blood received in culture bottles   Culture   Final    NO GROWTH 3 DAYS Performed at [facility], [address] [place], [place] [address]    Report Status PENDING  Incomplete    Coagulation Studies: No results for input(s): LABPROT, INR in the last 72 hours.   Urinalysis: No results for input(s): COLORURINE, LABSPEC, PHURINE, GLUCOSEU, HGBUR, BILIRUBINUR, KETONESUR, PROTEINUR, UROBILINOGEN, NITRITE, LEUKOCYTESUR in the last 72 hours.  Invalid input(s): APPERANCEUR     Imaging: No results found.    Medications:    anticoagulant sodium citrate      argatroban  0.29 mcg/kg/min (10/20/23 1200)   cefTRIAXone  (ROCEPHIN )  IV Stopped (10/19/23 2341)   metronidazole  Stopped (10/20/23 0527)    atorvastatin   20 mg Oral Daily   Chlorhexidine  Gluconate Cloth  6 each Topical Daily   famotidine   20 mg Oral BID   feeding supplement  1 Container Oral TID BM   insulin  aspart  0-9 Units Subcutaneous Q4H   levETIRAcetam   1,000 mg Intravenous Q12H   traZODone   50 mg Oral QHS   acetaminophen , anticoagulant sodium citrate , loperamide   Assessment/ Plan:  Ms. Laiyla Slagel is a 78 y.o.  female with past medical conditions including GERD, CVA, hypertension, diabetes, hyperlipidemia, CAD, gout, obstructive sleep apnea, UTI, chronic kidney disease stage III, who was admitted to [facility] on 10/16/2023 for Altered mental status [R41.82] Altered mental status, unspecified altered mental status type  [R41.82]   Acute kidney injury likely secondary to septic UTI. Baseline unknown. UA positive for leukocytes and bacteria. No recent IV contrast exposure. Creatinine on ED arrival 8.32.  Patient has received CRRT during this admission.  Stopped on 7/17.  Creatinine did increase however urine output is also increasing.  No acute indication for dialysis.  Will continue to monitor daily for need.  Continue to avoid nephrotoxic agents and therapies, if possible.  Lab Results  Component Value Date   CREATININE 1.67 (H) 10/20/2023   CREATININE 1.08 (H) 10/19/2023   CREATININE 1.45 (H) 10/19/2023    Intake/Output Summary (Last 24 hours) at 10/20/2023 1427 Last data filed at 10/20/2023 1200 Gross per 24 hour  Intake 1301.2 ml  Output 1295 ml  Net 6.2 ml   2.  Hypokalemia/hypermagnesium.  Potassium 3.3.  Magnesium   2.5.  Oral potassium supplementation ordered.   3.  Acute metabolic acidosis.  Serum bicarb 10 on ED arrival.  Corrected with dialysis and IV supplementation.       4.  Sepsis likely secondary to UTI with suspected abdominal involvement based on nausea, vomiting, and diarrhea.  UA positive for UTI, urine culture pending.  Patient does have history of E. coli and Enterococcus facilities UTI.  Currently prescribed ceftriaxone , metronidazole .  Vancomycin  stopped    LOS: 4 Paulla Mcclaskey 7/18/20252:27 PM

## 2023-10-20 NOTE — Progress Notes (Signed)
 PHARMACY - ANTICOAGULATION CONSULT NOTE  Pharmacy Consult for argatroban  infusion Indication: atrial fibrillation  Allergies  Allergen Reactions   Codeine Other (See Comments)    GI Distress GI Distress GI Distress   Codeine    Digoxin     Other reaction(s): Unknown   Heparin      Keep in place until HIT r/o   Lactose Intolerance (Gi)    Lanoxin [Digoxin]    Penicillins    Zoloft [Sertraline Hcl]    Penicillins Rash and Other (See Comments)    Patient Measurements: Height: 5' 3 (160 cm) Weight: 101.2 kg (223 lb 1.7 oz) IBW/kg (Calculated) : 52.4 HEPARIN  DW (KG): 76.8  Vital Signs: Temp: 97.6 F (36.4 C) (07/18 0400) Temp Source: Axillary (07/18 0400) BP: 126/68 (07/18 0700) Pulse Rate: 59 (07/18 0700)  Labs: Recent Labs    10/17/23 1703 10/17/23 1934 10/17/23 2105 10/17/23 2351 10/18/23 0409 10/18/23 0738 10/18/23 1532 10/19/23 0405 10/19/23 9361 10/19/23 1615 10/19/23 2051 10/20/23 0053 10/20/23 0431  HGB   < >  --   --   --  9.7*  --   --  8.7*  --   --   --   --  9.1*  HCT  --   --   --   --  28.6*  --   --  25.3*  --   --   --   --  26.6*  PLT  --   --   --   --  182  --   --  118*  --   --   --   --  112*  APTT  --   --    < >  --   --  >200*   < >  --  76* 91* 75* 98* 100*  HEPARINUNFRC  --   --   --   --   --  >1.10*  --   --  1.04*  --   --   --   --   CREATININE  --   --   --   --  3.23*  --    < > 1.45*  --  1.08*  --   --  1.67*  TROPONINIHS  --  123*  --  175* 196*  --   --   --   --   --   --   --   --    < > = values in this interval not displayed.    Estimated Creatinine Clearance: 32.5 mL/min (A) (by C-G formula based on SCr of 1.67 mg/dL (H)).   Medical History: Past Medical History:  Diagnosis Date   Afib (HCC)    Atrial fibrillation (HCC)    Diabetes mellitus without complication (HCC)    DM (diabetes mellitus) (HCC)    Dysrhythmia    HTN (hypertension)    Hypertension    Stroke (HCC) 07/21/12   posterior superior left  frontal lobe parenchymal hemorrhage     Medications:  Scheduled:   vitamin C   500 mg Oral BID   atorvastatin   20 mg Oral Daily   Chlorhexidine  Gluconate Cloth  6 each Topical Daily   famotidine   20 mg Oral BID   feeding supplement  1 Container Oral TID BM   insulin  aspart  0-9 Units Subcutaneous Q4H   levETIRAcetam   1,000 mg Intravenous Q12H   multivitamin  1 tablet Oral QHS   thiamine  (VITAMIN B1) injection  100 mg Intravenous QHS   traZODone   50 mg Oral QHS   Infusions:   albumin  human Stopped (10/19/23 1444)   anticoagulant sodium citrate      argatroban  0.29 mcg/kg/min (10/20/23 0519)   cefTRIAXone  (ROCEPHIN )  IV Stopped (10/19/23 2341)   metronidazole  100 mL/hr at 10/20/23 0505   norepinephrine  (LEVOPHED ) Adult infusion Stopped (10/19/23 1655)   vasopressin  Stopped (10/19/23 0251)    Assessment: 78 y.o. female presenting with altered mental status. PMH significant for paroxysmal atrial fibrillation, pacemaker for complete heart block, hypertension, hyperlipidemia, prior CVA, diabetes. She takes apixaban  PTA with last dose 07/15 0155. She was started on IV heparin  but being transitioned to argatroban  ISO thrombocytopenia (HIT score addressed in a separate note)  Baseline Labs: Hgb 11.7, PLT wnl, INR 1.3  Goal of Therapy:  aPTT 50 - 90 seconds Monitor platelets by anticoagulation protocol: Yes  Plan: aPTT therapeutic x 1 - continue argatroban  infusion at 1.77 ml/hr (0.29 mcg/kg/min)  - recheck aPTT in 4 hours to confirm - check aPTT Q12H once therapeutic X 2   [name] Clinical Pharmacist 10/20/2023

## 2023-10-20 NOTE — Progress Notes (Signed)
 PHARMACY - ANTICOAGULATION CONSULT NOTE  Pharmacy Consult for transition from heparin  to argatroban  infusion Indication: atrial fibrillation  Allergies  Allergen Reactions   Codeine Other (See Comments)    GI Distress GI Distress GI Distress   Codeine    Digoxin     Other reaction(s): Unknown   Heparin      Keep in place until HIT r/o   Lactose Intolerance (Gi)    Lanoxin [Digoxin]    Penicillins    Zoloft [Sertraline Hcl]    Penicillins Rash and Other (See Comments)    Patient Measurements: Height: 5' 3 (160 cm) Weight: 101.6 kg (223 lb 15.8 oz) IBW/kg (Calculated) : 52.4 HEPARIN  DW (KG): 76.8  Vital Signs: Temp: 97.8 F (36.6 C) (07/18 0000) Temp Source: Axillary (07/18 0000) BP: 121/68 (07/18 0100) Pulse Rate: 60 (07/18 0100)  Labs: Recent Labs    10/17/23 0441 10/17/23 1003 10/17/23 1934 10/17/23 2105 10/17/23 2351 10/18/23 0409 10/18/23 0738 10/18/23 1532 10/18/23 2151 10/19/23 0405 10/19/23 0638 10/19/23 1615 10/19/23 2051 10/20/23 0053  HGB 11.7*  --   --   --   --  9.7*  --   --   --  8.7*  --   --   --   --   HCT 38.9  --   --   --   --  28.6*  --   --   --  25.3*  --   --   --   --   PLT 261  --   --   --   --  182  --   --   --  118*  --   --   --   --   APTT  --   --   --    < >  --   --  >200*  --    < >  --  76* 91* 75* 98*  HEPARINUNFRC  --   --   --   --   --   --  >1.10*  --   --   --  1.04*  --   --   --   CREATININE 8.36*   < >  --   --   --  3.23*  --  2.20*  --  1.45*  --  1.08*  --   --   TROPONINIHS  --    < > 123*  --  175* 196*  --   --   --   --   --   --   --   --    < > = values in this interval not displayed.    Estimated Creatinine Clearance: 50.4 mL/min (A) (by C-G formula based on SCr of 1.08 mg/dL (H)).   Medical History: Past Medical History:  Diagnosis Date   Afib (HCC)    Atrial fibrillation (HCC)    Diabetes mellitus without complication (HCC)    DM (diabetes mellitus) (HCC)    Dysrhythmia    HTN  (hypertension)    Hypertension    Stroke (HCC) 07/21/12   posterior superior left frontal lobe parenchymal hemorrhage     Medications:  Scheduled:   vitamin C   500 mg Oral BID   atorvastatin   20 mg Oral Daily   Chlorhexidine  Gluconate Cloth  6 each Topical Daily   famotidine   20 mg Oral BID   feeding supplement  1 Container Oral TID BM   insulin  aspart  0-9 Units Subcutaneous Q4H   levETIRAcetam   1,000 mg Intravenous Q12H   multivitamin  1 tablet Oral QHS   thiamine  (VITAMIN B1) injection  100 mg Intravenous QHS   traZODone   50 mg Oral QHS   Infusions:   albumin  human Stopped (10/19/23 1444)   anticoagulant sodium citrate      argatroban  0.4 mcg/kg/min (10/20/23 0000)   cefTRIAXone  (ROCEPHIN )  IV Stopped (10/19/23 2341)   metronidazole  Stopped (10/19/23 1738)   norepinephrine  (LEVOPHED ) Adult infusion Stopped (10/19/23 1655)   vasopressin  Stopped (10/19/23 0251)    Assessment: 78 y.o. female presenting with altered mental status. PMH significant for paroxysmal atrial fibrillation, pacemaker for complete heart block, hypertension, hyperlipidemia, prior CVA, diabetes. She takes apixaban  PTA with last dose 07/15 0155. She was started on IV heparin  but being transitioned to argatroban  ISO thrombocytopenia (HIT score addressed in a separate note)  Baseline Labs: Hgb 11.7, PLT wnl, INR 1.3  Goal of Therapy:  aPTT 50 - 90 seconds Monitor platelets by anticoagulation protocol: Yes  7/17@1615 : aPTT 91sec, SUPRAtherapeutic 7/17 2051  aPTT 75 sec, Therapeutic x 1  7/18 0053  aPTT 98 sec, SUPRAtherapeutic    Plan:  7/18:  aPTT @ 0053 = 93 sec, SUPRAtherapeutic - will decrease argatroban  infusion to 2.2 ml/hr (0.36 mcg/kg/min) and recheck aPTT 4 hrs after rate change - check aPTT Q12H once therapeutic X 2  ---Continue to monitor H&H and platelets  Traci Carter D Clinical Pharmacist 10/20/2023

## 2023-10-20 NOTE — Plan of Care (Signed)
  Problem: Clinical Measurements: Goal: Ability to maintain clinical measurements within normal limits will improve Outcome: Progressing Goal: Respiratory complications will improve Outcome: Progressing Goal: Cardiovascular complication will be avoided Outcome: Progressing   Problem: Nutrition: Goal: Adequate nutrition will be maintained Outcome: Progressing   Problem: Coping: Goal: Level of anxiety will decrease Outcome: Progressing   Problem: Pain Managment: Goal: General experience of comfort will improve and/or be controlled Outcome: Progressing   Problem: Education: Goal: Knowledge of General Education information will improve Description: Including pain rating scale, medication(s)/side effects and non-pharmacologic comfort measures Outcome: Not Progressing   Problem: Health Behavior/Discharge Planning: Goal: Ability to manage health-related needs will improve Outcome: Not Progressing

## 2023-10-21 DIAGNOSIS — R410 Disorientation, unspecified: Secondary | ICD-10-CM

## 2023-10-21 LAB — CBC
HCT: 28.8 % — ABNORMAL LOW (ref 36.0–46.0)
Hemoglobin: 9.6 g/dL — ABNORMAL LOW (ref 12.0–15.0)
MCH: 31.7 pg (ref 26.0–34.0)
MCHC: 33.3 g/dL (ref 30.0–36.0)
MCV: 95 fL (ref 80.0–100.0)
Platelets: 113 K/uL — ABNORMAL LOW (ref 150–400)
RBC: 3.03 MIL/uL — ABNORMAL LOW (ref 3.87–5.11)
RDW: 14.1 % (ref 11.5–15.5)
WBC: 10.1 K/uL (ref 4.0–10.5)
nRBC: 0 % (ref 0.0–0.2)

## 2023-10-21 LAB — MAGNESIUM: Magnesium: 2.1 mg/dL (ref 1.7–2.4)

## 2023-10-21 LAB — RENAL FUNCTION PANEL
Albumin: 3.1 g/dL — ABNORMAL LOW (ref 3.5–5.0)
Anion gap: 10 (ref 5–15)
BUN: 32 mg/dL — ABNORMAL HIGH (ref 8–23)
CO2: 26 mmol/L (ref 22–32)
Calcium: 8.7 mg/dL — ABNORMAL LOW (ref 8.9–10.3)
Chloride: 105 mmol/L (ref 98–111)
Creatinine, Ser: 2.54 mg/dL — ABNORMAL HIGH (ref 0.44–1.00)
GFR, Estimated: 19 mL/min — ABNORMAL LOW (ref >60)
Glucose, Bld: 128 mg/dL — ABNORMAL HIGH (ref 70–99)
Phosphorus: 4.6 mg/dL (ref 2.5–4.6)
Potassium: 3.6 mmol/L (ref 3.5–5.1)
Sodium: 141 mmol/L (ref 135–145)

## 2023-10-21 LAB — APTT
aPTT: 66 s — ABNORMAL HIGH (ref 24–36)
aPTT: 51 s — ABNORMAL HIGH (ref 24–36)

## 2023-10-21 LAB — GLUCOSE, CAPILLARY
Glucose-Capillary: 121 mg/dL — ABNORMAL HIGH (ref 70–99)
Glucose-Capillary: 102 mg/dL — ABNORMAL HIGH (ref 70–99)

## 2023-10-21 MED ORDER — APIXABAN 5 MG PO TABS
5.0000 mg | ORAL_TABLET | Freq: Two times a day (BID) | ORAL | Status: DC
  Administered 2023-10-21 – 2023-10-30 (×19): 5 mg via ORAL
  Filled 2023-10-21 (×19): qty 1

## 2023-10-21 NOTE — Progress Notes (Signed)
 [facility]  PROGRESS NOTE   Subjective:   Patient seen at bedside.  Comfortable.  Urine output is good.  Objective:  Vital signs: Blood pressure (!) 144/89, pulse 81, temperature 98.5 F (36.9 C), temperature source Oral, resp. rate 17, height 5' 3 (1.6 m), weight 101.5 kg, SpO2 100%.  Intake/Output Summary (Last 24 hours) at 10/21/2023 1420 Last data filed at 10/21/2023 1131 Gross per 24 hour  Intake 841.28 ml  Output 1650 ml  Net -808.72 ml   Filed Weights   10/19/23 0311 10/20/23 0300 10/21/23 0300  Weight: 101.6 kg 101.2 kg 101.5 kg     Physical Exam: General:  No acute distress  Head:  Normocephalic, atraumatic. Moist oral mucosal membranes  Eyes:  Anicteric  Neck:  Supple  Lungs:   Clear to auscultation, normal effort  Heart:  S1S2 no rubs  Abdomen:   Soft, nontender, bowel sounds present  Extremities:  peripheral edema.  Neurologic:  Awake, alert, following commands  Skin:  No lesions  Access:     Basic Metabolic Panel: Recent Labs  Lab 10/17/23 0441 10/17/23 1003 10/18/23 0409 10/18/23 1532 10/19/23 0405 10/19/23 1615 10/20/23 0431 10/21/23 0418  NA 136   < > 137 136 138 135 137 141  K 6.2*   < > 3.6 3.8 3.5 3.4* 3.3* 3.6  CL 105   < > 102 102 105 102 102 105  CO2 7*   < > 23 20* 24 24 25 26   GLUCOSE 181*   < > 107* 163* 118* 259* 162* 128*  BUN 93*   < > 42* 30* 20 17 22  32*  CREATININE 8.36*   < > 3.23* 2.20* 1.45* 1.08* 1.67* 2.54*  CALCIUM  7.9*   < > 6.9* 7.3* 7.5* 8.0* 8.5* 8.7*  MG 2.0  --  2.1  --  2.3  --  2.5* 2.1  PHOS 9.5*   < > 3.2 3.0 2.2* 1.9* 4.0 4.6   < > = values in this interval not displayed.   GFR: Estimated Creatinine Clearance: 21.4 mL/min (A) (by C-G formula based on SCr of 2.54 mg/dL (H)).  Liver Function Tests: Recent Labs  Lab 10/16/23 1520 10/17/23 0441 10/17/23 1703 10/18/23 1532 10/19/23 0405 10/19/23 1615 10/20/23 0431 10/21/23 0418  AST 32 25  --   --   --   --   --   --   ALT 17 15  --    --   --   --   --   --   ALKPHOS 83 76  --   --   --   --   --   --   BILITOT 0.7 0.8  --   --   --   --   --   --   PROT 7.0 5.9*  --   --   --   --   --   --   ALBUMIN  3.4* 2.9*   < > 2.9* 2.6* 3.3* 3.2* 3.1*   < > = values in this interval not displayed.   No results for input(s): LIPASE, AMYLASE in the last 168 hours. No results for input(s): AMMONIA in the last 168 hours.  CBC: Recent Labs  Lab 10/17/23 0441 10/18/23 0409 10/19/23 0405 10/20/23 0431 10/21/23 0418  WBC 13.8* 11.2* 7.0 8.0 10.1  HGB 11.7* 9.7* 8.7* 9.1* 9.6*  HCT 38.9 28.6* 25.3* 26.6* 28.8*  MCV 102.6* 93.5 93.0 94.0 95.0  PLT 261 182 118* 112* 113*  HbA1C: Hgb A1c MFr Bld  Date/Time Value Ref Range Status  10/17/2023 04:41 AM 5.7 (H) 4.8 - 5.6 % Final    Comment:    (NOTE)         Prediabetes: 5.7 - 6.4         Diabetes: >6.4         Glycemic control for adults with diabetes: <7.0   01/17/2021 04:43 AM 11.5 (H) 4.8 - 5.6 % Final    Comment:    (NOTE)         Prediabetes: 5.7 - 6.4         Diabetes: >6.4         Glycemic control for adults with diabetes: <7.0     Urinalysis: No results for input(s): COLORURINE, LABSPEC, PHURINE, GLUCOSEU, HGBUR, BILIRUBINUR, KETONESUR, PROTEINUR, UROBILINOGEN, NITRITE, LEUKOCYTESUR in the last 72 hours.  Invalid input(s): APPERANCEUR    Imaging: No results found.   Medications:    anticoagulant sodium citrate      metronidazole  Stopped (10/21/23 0548)    apixaban   5 mg Oral BID   atorvastatin   20 mg Oral Daily   Chlorhexidine  Gluconate Cloth  6 each Topical Daily   famotidine   20 mg Oral BID   feeding supplement  1 Container Oral TID BM   levETIRAcetam   1,000 mg Intravenous Q12H   traZODone   50 mg Oral QHS    Assessment/ Plan:     78 y.o.  female with past medical conditions including GERD, CVA, hypertension, diabetes, hyperlipidemia, CAD, gout, obstructive sleep apnea, UTI, status post pacemaker placement,  chronic kidney disease stage III, who was admitted to [facility] on 10/16/2023 for Altered mental status [R41.82]   #1: Acute kidney injury: Patient had oliguric renal failure.  She was started on CRRT.  She is presently off of CRRT.  Creatinine levels are stabilizing.  Urine output is good.  #2: Sepsis secondary to UTI: She was on ceftriaxone , vancomycin  and metronidazole .  #3: Acute metabolic acidosis: This is resolved at this time.  #4: Diabetes: Continue insulin  as per protocol.  Labs and medications reviewed. Will continue to follow along with you.   LOS: 5 Gracelee Stemmler, MD [facility] 7/19/20252:20 PM

## 2023-10-21 NOTE — Evaluation (Signed)
 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 Coli and Enterococcus faecalis UTI who presented to the ED with chief complaints of altered mental status.     Clinical Impressions Pt was seen for OT evaluation this date. Pt is a poor historian, all PLOF and home set up was obtained via phone call with pt's daughter [name]. Pt alert and oriented to name only. Prior to hospital admission, pt was MODI in BADLs, family assisting with most IADLs including meal prepping, finance management and driving to appointment/ordering gisele (daughter works full time). Pt amb with RW around home with limited community mobility. PTA she lived in one level home with a ramped entrance. Pt lived with her husband who is currently in comfort [facility] at [facility]. Pt presents to acute OT demonstrating impaired ADL performance and functional mobility 2/2 (See OT problem list for additional functional deficits). Pt currently requires MAXA for bed mobility, min external support from author for sitting on the EOB for short time, <1 min sitting on the EOB with bilUE support on the bed. Pt required multimodal cues for LE and UE exercises on the EOB. Pt fearful of standing on this date despite verbal encouragement to attempt. Will continue to assess pt functional mobility on next available date. Pt would benefit from skilled OT services to address noted impairments and functional limitations (see below for any additional details) in order to maximize safety and independence while minimizing falls risk and caregiver burden. OT will follow acutely.   If plan is discharge home,  recommend the following:   A lot of help with walking and/or transfers;A lot of help with bathing/dressing/bathroom;Assistance with cooking/housework;Assistance with feeding;Assist for transportation;Help with stairs or ramp for entrance;Direct supervision/assist for medications management     Functional Status Assessment   Patient has had a recent decline in their functional status and demonstrates the ability to make significant improvements in function in a reasonable and predictable amount of time.     Equipment Recommendations   Other (comment) (Defer to next venue of care)     Recommendations for Other Services         Precautions/Restrictions   Precautions Precautions: Fall Recall of Precautions/Restrictions: Impaired Restrictions Weight Bearing Restrictions Per Provider Order: No     Mobility Bed Mobility Overal bed mobility: Needs Assistance Bed Mobility: Supine to Sit, Sit to Supine     Supine to sit: Max assist, HOB elevated, Used rails Sit to supine: Max assist, HOB elevated   General bed mobility comments: MAXA for all bed mobility, HOH for handplacement on bed rails    Transfers Overall transfer level: Needs assistance                 General transfer comment: Unsafe to attempt, pt limited sitting balance, posterior lean back into bed when attempted standing      Balance Overall balance assessment: Needs assistance Sitting-balance support: Bilateral upper extremity supported, Feet unsupported Sitting balance-Leahy Scale: Poor Sitting balance - Comments: ~35min static sitting balance with bilateral UE support Postural control: Posterior lean  ADL either performed or assessed with clinical judgement   ADL Overall ADL's : Needs assistance/impaired Eating/Feeding: Minimal assistance;Bed level Eating/Feeding Details (indicate cue type and reason): Able to feed self with set up of plate and  cutting up of all food.                 Lower Body Dressing: Maximal assistance;Bed level Lower Body Dressing Details (indicate cue type and reason): MAXA for donning bil socks at bed level               General ADL Comments: Anticipate MAXA for pericare, and MINA-MODA for grooming tasks      Pertinent Vitals/Pain Pain Assessment Pain Assessment: Faces Faces Pain Scale: Hurts little more Pain Location: Generalized pain unable to describe location Pain Descriptors / Indicators: Discomfort Pain Intervention(s): Limited activity within patient's tolerance, Repositioned     Extremity/Trunk Assessment Upper Extremity Assessment Upper Extremity Assessment: Generalized weakness   Lower Extremity Assessment Lower Extremity Assessment: Generalized weakness;Defer to PT evaluation       Communication Communication Communication: Impaired Factors Affecting Communication: Difficulty expressing self   Cognition Arousal: Alert Behavior During Therapy: Anxious Cognition: Cognition impaired   Orientation impairments: Place, Time, Situation Awareness: Intellectual awareness impaired Memory impairment (select all impairments): Short-term memory, Working [employer] fast streamer, Non-declarative long-term memory, Armed forces training and education officer functioning impairment (select all impairments): Initiation, Organization, Sequencing, Reasoning, Problem solving OT - Cognition Comments: Alert to name only, knows she is having trouble recalling answers to questions.                 Following commands: Impaired Following commands impaired: Follows one step commands inconsistently     Cueing  General Comments   Cueing Techniques: Verbal cues;Gestural cues;Tactile cues;Visual cues  VSS   Exercises Exercises: Other exercises Other Exercises Other Exercises: Edu: Role of OT within acute, Safe ADL completion, orientation, bed positioning   Shoulder Instructions      Home Living  Family/patient expects to be discharged to:: Private residence   Available Help at Discharge: Available PRN/intermittently (Family works full time) Type of Home: House Home Access: Ramped entrance     Home Layout: Two level;Able to live on main level with bedroom/bathroom     Bathroom Shower/Tub: Tub/shower unit   Bathroom Toilet: Handicapped height Bathroom Accessibility: Yes How Accessible: Accessible via walker Home Equipment: Rolling Walker (2 wheels);BSC/3in1;Toilet riser   Additional Comments: Pt is a poor historian will need to confirm with family PLOF and home set up      Prior Functioning/Environment Prior Level of Function : Needs assist             Mobility Comments: RW at baseline ADLs Comments: MODI for ADLs, bird baths at baseline, daughter assist with IADLs. Uses lift or uber for community mobility.    OT Problem List: Decreased strength;Decreased range of motion;Decreased activity tolerance;Impaired balance (sitting and/or standing);Decreased cognition;Decreased safety awareness;Decreased knowledge of use of DME or AE   OT Treatment/Interventions: Self-care/ADL training;Therapeutic exercise;Energy conservation;DME and/or AE instruction;Therapeutic activities;Cognitive remediation/compensation;Patient/family education;Balance training      OT Goals(Current goals can be found in the care plan section)   Acute Rehab OT Goals Patient Stated Goal: Go to rehab OT Goal Formulation: With family Time For Goal Achievement: 11/04/23 Potential to Achieve Goals: Good ADL Goals Pt Will Perform Grooming: sitting;with set-up Pt Will Perform Lower Body Dressing: with max assist;sit to/from stand Pt Will Transfer to Toilet: stand pivot transfer;with mod assist Pt Will Perform Toileting -  Clothing Manipulation and hygiene: sitting/lateral leans;with max assist   OT Frequency:  Min 2X/week    Co-evaluation              AM-PAC OT 6 Clicks Daily Activity      Outcome Measure Help from another person eating meals?: A Lot Help from another person taking care of personal grooming?: A Lot Help from another person toileting, which includes using toliet, bedpan, or urinal?: Total Help from another person bathing (including washing, rinsing, drying)?: Total Help from another person to put on and taking off regular upper body clothing?: Total Help from another person to put on and taking off regular lower body clothing?: Total 6 Click Score: 8   End of Session Nurse Communication: Other (comment) (Pt transfering to new room)  Activity Tolerance: Patient tolerated treatment well Patient left: in bed;with call bell/phone within reach;with bed alarm set;with nursing/sitter in room  OT Visit Diagnosis: Unsteadiness on feet (R26.81);Other abnormalities of gait and mobility (R26.89);Muscle weakness (generalized) (M62.81)                Time: 8686-8671 OT Time Calculation (min): 15 min Charges:  OT General Charges $OT Visit: 1 Visit OT Evaluation $OT Eval Moderate Complexity: 1 Mod OT Treatments $Self Care/Home Management : 8-22 mins  Larraine Colas M.S. OTR/L  10/21/23, 3:02 PM

## 2023-10-21 NOTE — Progress Notes (Signed)
 PHARMACY - ANTICOAGULATION CONSULT NOTE  Pharmacy Consult for argatroban  infusion Indication: atrial fibrillation  Allergies  Allergen Reactions   Codeine Other (See Comments)    GI Distress GI Distress GI Distress   Codeine    Digoxin     Other reaction(s): Unknown   Heparin      Keep in place until HIT r/o   Lactose Intolerance (Gi)    Lanoxin [Digoxin]    Penicillins    Zoloft [Sertraline Hcl]    Penicillins Rash and Other (See Comments)    Patient Measurements: Height: 5' 3 (160 cm) Weight: 101.5 kg (223 lb 12.3 oz) IBW/kg (Calculated) : 52.4 HEPARIN  DW (KG): 76.8  Vital Signs: Temp: 97.8 F (36.6 C) (07/19 0400) Temp Source: Axillary (07/19 0400) BP: 146/89 (07/19 0430) Pulse Rate: 82 (07/19 0430)  Labs: Recent Labs    10/18/23 0738 10/18/23 1532 10/19/23 0405 10/19/23 9361 10/19/23 1615 10/19/23 2051 10/20/23 0431 10/20/23 0822 10/20/23 1259 10/21/23 0418  HGB  --    < > 8.7*  --   --   --  9.1*  --   --  9.6*  HCT  --   --  25.3*  --   --   --  26.6*  --   --  28.8*  PLT  --   --  118*  --   --   --  112*  --   --  113*  APTT >200*   < >  --  76* 91*   < > 100* 81* 72* 66*  HEPARINUNFRC >1.10*  --   --  1.04*  --   --   --   --   --   --   CREATININE  --    < > 1.45*  --  1.08*  --  1.67*  --   --  2.54*   < > = values in this interval not displayed.    Estimated Creatinine Clearance: 21.4 mL/min (A) (by C-G formula based on SCr of 2.54 mg/dL (H)).   Medical History: Past Medical History:  Diagnosis Date   Afib (HCC)    Atrial fibrillation (HCC)    Diabetes mellitus without complication (HCC)    DM (diabetes mellitus) (HCC)    Dysrhythmia    HTN (hypertension)    Hypertension    Stroke (HCC) 07/21/12   posterior superior left frontal lobe parenchymal hemorrhage     Medications:  Scheduled:   atorvastatin   20 mg Oral Daily   Chlorhexidine  Gluconate Cloth  6 each Topical Daily   famotidine   20 mg Oral BID   feeding supplement  1  Container Oral TID BM   insulin  aspart  0-9 Units Subcutaneous Q4H   levETIRAcetam   1,000 mg Intravenous Q12H   traZODone   50 mg Oral QHS   Infusions:   anticoagulant sodium citrate      argatroban  0.29 mcg/kg/min (10/21/23 0449)   cefTRIAXone  (ROCEPHIN )  IV Stopped (10/20/23 2346)   metronidazole  100 mL/hr at 10/21/23 0449    Assessment: 78 y.o. female presenting with altered mental status. PMH significant for paroxysmal atrial fibrillation, pacemaker for complete heart block, hypertension, hyperlipidemia, prior CVA, diabetes. She takes apixaban  PTA with last dose 07/15 0155. She was started on IV heparin  but being transitioned to argatroban  ISO thrombocytopenia (HIT score addressed in a separate note)  Baseline Labs: Hgb 11.7, PLT wnl, INR 1.3  Goal of Therapy:  aPTT 50 - 90 seconds Monitor platelets by anticoagulation protocol: Yes  Plan:  7/19:  aPTT @ 0418 = 66, therapeutic X 3  - continue argatroban  infusion at 1.77 ml/hr (0.29 mcg/kg/min)  - recheck aPTT in 12 hrs  - CBC daily       Platelets @ 0418 = 113, slight increase from 112 on 7/18  Cornel Werber D Clinical Pharmacist 10/21/2023

## 2023-10-21 NOTE — Progress Notes (Signed)
  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 Coli and Enterococcus faecalis UTI who presented to the ED with chief complaints of altered mental status.   Admitted to ICU for pressor and CRRT.  Transferred to [facility] on 10/21/23.   #Septic shock - PRESENT ON ADMISSION - due to UTI and probable intrabdominal source (nv and diarrhea) Hx of E. Coli and Enterococcus faecalis UTI  Presenting with altered mental status, UA positive for UTI --completed 5 days of ceftriaxone  and flagyl   #AKI on CKD stage III Patient had oliguric renal failure. She was started on CRRT. She is presently off of CRRT. Creatinine levels are stabilizing. Urine output is good.  --management per nephro  Acute metabolic acidosis, resolved Hyperkalemia, resolved  Hypokalemia Hypomag --monitor and supplement PRN   #Paroxysmal Afib  Dual-chamber pacemaker for complete heart block --transition from agatroban gtt to eliquis  today  #HTN:  --BP intermittently low --hold home Lisinopril   #Hx of seizures -Continued home keppra    #Anxiety and Depression -No home meds   #T2DM --A1c 5.7. --d/c BG checks and SSI  Likely dementia --pt alert, but not oriented.  Confused.   #Chronic Gout     #FTT #Falls Risk She uses a walker at baseline and will use a wheelchair which she goes to the doctor's office. Recent multiple falls with decreased po intake --PT/OT   #OSA not on CPAP Remote hx of Asthma/COPD   DVT prophylaxis: On:Eliquis  Code Status: Full code  Family Communication:  Level of care: Med-Surg Dispo:   The patient is  from: home Anticipated d/c is to: to be determined Anticipated d/c date is: to be determined   Subjective and Interval History:  Pt had no complaint, although pt was very confused.   Objective: Vitals:   10/21/23 1030 10/21/23 1100 10/21/23 1131 10/21/23 1418  BP: (!) 115/59 102/65 117/70 (!) 144/89  Pulse: 69 72 85 81  Resp: 13 12 16 17   Temp:   98.2 F (36.8 C) 98.5 F (36.9 C)  TempSrc:   Axillary Oral  SpO2: 97% 94% 95% 100%  Weight:      Height:        Intake/Output Summary (Last 24 hours) at 10/21/2023 1718 Last data filed at 10/21/2023 1131 Gross per 24 hour  Intake 841.28 ml  Output 1250 ml  Net -408.72 ml   Filed Weights   10/19/23 0311 10/20/23 0300 10/21/23 0300  Weight: 101.6 kg 101.2 kg 101.5 kg    Examination:   Constitutional: NAD, alert, oriented to self only HEENT: conjunctivae and lids normal, EOMI CV: No cyanosis.   RESP: normal respiratory effort, on RA Neuro: II - XII grossly intact.   Foley present   Data Reviewed: I have personally reviewed labs and imaging studies  Time spent: 50 minutes  Ellouise Haber, MD [facility] If 7PM-7AM, please contact night-coverage 10/21/2023, 5:18 PM

## 2023-10-21 NOTE — Evaluation (Signed)
 Physical Therapy Evaluation Patient Details Name: Traci Carter MRN: [id number] DOB: 01-18-1946 Today's Date: 10/21/2023  History of Present Illness  Traci Carter is a 78yoF who comes to [facility] on 10/16/23 c AMS, diarrhea, blood in stool, emesis x2 days. Frequent falls recently at home. PMH: PAF, PPM s/p HB, HTN, CVA, DM2. Pt admitted with circulatory shock 2/2 UTI and AKI. Pt requires ICU level care and transferred to 1a from ICU on 10/21/23.  Clinical Impression  Pt awake, interactive, generally pleasant although she has some receptive and expressive language deficits that complicate assessment. Pt requires total assist for bed mobility, but sits well at EOB. Pt attempts lateral scoot transfer once, but is unsuccessful. Pt makes known her fearfulness of attempted to stand, asks repeatedly to not be made to do so. Unable to get a clear answer on her most recent baseline for AMB, but appears pt has struggled with mobility for a few years now per admission records here. At one point pt asked about who she lives with at home- she explains that her husband is dead repeatedly (in her own limited words) and is unable to be convinced otherwise despite him being on 1C currently admitted. Will continue to follow to maximize mobility tolerance, however it is not clear home much her mentation/language will limit ability to partake in more advanced interventions.       If plan is discharge home, recommend the following: Two people to help with walking and/or transfers;Two people to help with bathing/dressing/bathroom   Can travel by private vehicle   No    Equipment Recommendations None recommended by PT  Recommendations for Other Services       Functional Status Assessment Patient has had a recent decline in their functional status and demonstrates the ability to make significant improvements in function in a reasonable and predictable amount of time.     Precautions / Restrictions  Precautions Precautions: Fall Recall of Precautions/Restrictions: Impaired Restrictions Weight Bearing Restrictions Per Provider Order: No      Mobility  Bed Mobility Overal bed mobility: Needs Assistance Bed Mobility: Supine to Sit, Sit to Supine     Supine to sit: Max assist, HOB elevated Sit to supine: Max assist        Transfers Overall transfer level: Needs assistance Equipment used: Rolling walker (2 wheels) Transfers: Sit to/from Stand, Bed to chair/wheelchair/BSC Sit to Stand: Total assist (pt with falls anxiety, refuses attempt; language defcits make further diggigng difficult)          Lateral/Scoot Transfers: Max assist, Total assist (attempts once, but does not get very far, then lies down in attempt to pull self up with bedrails)      Ambulation/Gait Ambulation/Gait assistance:  (pt unable to answer questions about most recent ability to stand or walk. Pt not agreeable to attempt standing.)                Stairs            Wheelchair Mobility     Tilt Bed    Modified Rankin (Stroke Patients Only)       Balance                                             Pertinent Vitals/Pain      Home Living Family/patient expects to be discharged to:: Private residence Living Arrangements: Spouse/significant other;Children (  husband currently admitted, has advanced dementia) Available Help at Discharge: Available PRN/intermittently Type of Home: House Home Access: Ramped entrance       Home Layout: Two level;Able to live on main level with bedroom/bathroom Home Equipment: Rolling Walker (2 wheels);BSC/3in1;Toilet riser Additional Comments: Pt is a poor historian will need to confirm with family PLOF and home set up    Prior Function Prior Level of Function : Needs assist             Mobility Comments: RW at baseline ADLs Comments: MODI for ADLs, bird baths at baseline, daughter assist with IADLs. Uses lift or uber  for community mobility.     Extremity/Trunk Assessment   Upper Extremity Assessment Upper Extremity Assessment: Generalized weakness    Lower Extremity Assessment Lower Extremity Assessment: Generalized weakness;Defer to PT evaluation       Communication   Communication Communication: Impaired Factors Affecting Communication: Difficulty expressing self    Cognition                                         Cueing       General Comments General comments (skin integrity, edema, etc.): VSS    Exercises     Assessment/Plan    PT Assessment Patient needs continued PT services  PT Problem List Decreased strength;Decreased range of motion;Decreased activity tolerance;Decreased balance;Decreased mobility;Decreased coordination;Decreased cognition;Decreased knowledge of use of DME;Decreased safety awareness       PT Treatment Interventions DME instruction;Functional mobility training;Therapeutic activities;Therapeutic exercise;Balance training;Neuromuscular re-education;Patient/family education    PT Goals (Current goals can be found in the Care Plan section)  Acute Rehab PT Goals PT Goal Formulation: Patient unable to participate in goal setting    Frequency Min 2X/week     Co-evaluation               AM-PAC PT 6 Clicks Mobility  Outcome Measure Help needed turning from your back to your side while in a flat bed without using bedrails?: Total Help needed moving from lying on your back to sitting on the side of a flat bed without using bedrails?: Total Help needed moving to and from a bed to a chair (including a wheelchair)?: Total Help needed standing up from a chair using your arms (e.g., wheelchair or bedside chair)?: Total Help needed to walk in hospital room?: Total Help needed climbing 3-5 steps with a railing? : Total 6 Click Score: 6    End of Session   Activity Tolerance: Patient limited by fatigue;Other (comment) (falls  anxiety) Patient left: in bed;with bed alarm set;with call bell/phone within reach Nurse Communication: Mobility status PT Visit Diagnosis: Other abnormalities of gait and mobility (R26.89);Unsteadiness on feet (R26.81);Muscle weakness (generalized) (M62.81);Dizziness and giddiness (R42)    Time: 8551-8494 PT Time Calculation (min) (ACUTE ONLY): 17 min   Charges:   PT Evaluation $PT Eval Moderate Complexity: 1 Mod   PT General Charges $$ ACUTE PT VISIT: 1 Visit       3:26 PM, 10/21/23 Peggye JAYSON Linear, PT, DPT Physical Therapist - [facility]  [phone] (ASCOM)    Kamillah Didonato C 10/21/2023, 3:19 PM

## 2023-10-22 DIAGNOSIS — R410 Disorientation, unspecified: Secondary | ICD-10-CM | POA: Diagnosis not present

## 2023-10-22 LAB — BASIC METABOLIC PANEL WITH GFR
Anion gap: 10 (ref 5–15)
BUN: 37 mg/dL — ABNORMAL HIGH (ref 8–23)
CO2: 27 mmol/L (ref 22–32)
Calcium: 8.6 mg/dL — ABNORMAL LOW (ref 8.9–10.3)
Chloride: 104 mmol/L (ref 98–111)
Creatinine, Ser: 2.44 mg/dL — ABNORMAL HIGH (ref 0.44–1.00)
GFR, Estimated: 20 mL/min — ABNORMAL LOW (ref >60)
Glucose, Bld: 232 mg/dL — ABNORMAL HIGH (ref 70–99)
Potassium: 3.3 mmol/L — ABNORMAL LOW (ref 3.5–5.1)
Sodium: 141 mmol/L (ref 135–145)

## 2023-10-22 LAB — CBC
HCT: 28.8 % — ABNORMAL LOW (ref 36.0–46.0)
Hemoglobin: 9.7 g/dL — ABNORMAL LOW (ref 12.0–15.0)
MCH: 31.3 pg (ref 26.0–34.0)
MCHC: 33.7 g/dL (ref 30.0–36.0)
MCV: 92.9 fL (ref 80.0–100.0)
Platelets: 121 K/uL — ABNORMAL LOW (ref 150–400)
RBC: 3.1 MIL/uL — ABNORMAL LOW (ref 3.87–5.11)
RDW: 13.5 % (ref 11.5–15.5)
WBC: 10.4 K/uL (ref 4.0–10.5)
nRBC: 0 % (ref 0.0–0.2)

## 2023-10-22 LAB — CULTURE, BLOOD (ROUTINE X 2): Culture: NO GROWTH

## 2023-10-22 LAB — MAGNESIUM: Magnesium: 1.9 mg/dL (ref 1.7–2.4)

## 2023-10-22 MED ORDER — LEVETIRACETAM 500 MG PO TABS
500.0000 mg | ORAL_TABLET | Freq: Two times a day (BID) | ORAL | Status: DC
  Administered 2023-10-22 – 2023-10-30 (×17): 500 mg via ORAL
  Filled 2023-10-22 (×17): qty 1

## 2023-10-22 MED ORDER — POTASSIUM CHLORIDE 20 MEQ PO PACK
40.0000 meq | PACK | Freq: Once | ORAL | Status: AC
  Administered 2023-10-22: 40 meq via ORAL
  Filled 2023-10-22: qty 2

## 2023-10-22 NOTE — Progress Notes (Signed)
  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 Coli and Enterococcus faecalis UTI who presented to the ED with chief complaints of altered mental status.   Admitted to ICU for pressor and CRRT.  Transferred to [facility] on 10/21/23.   #Septic shock - PRESENT ON ADMISSION - due to UTI and probable intrabdominal source (nv and diarrhea) Hx of E. Coli and Enterococcus faecalis UTI  Presenting with altered mental status, UA positive for UTI --completed 5 days of ceftriaxone  and flagyl   #AKI on CKD stage III Patient had oliguric renal failure. She was started on CRRT. She is presently off of CRRT. Creatinine levels are stabilizing. Urine output is good.  --management per nephro  Acute metabolic acidosis, resolved Hyperkalemia, resolved  Hypokalemia --monitor and supplement per nephro  Hypomag --monitor and supplement PRN   #Paroxysmal Afib  Dual-chamber pacemaker for complete heart block --cont Eliquis   #HTN:  --BP intermittently low --hold home Lisinopril   #Hx of seizures -Continued home keppra    #Anxiety and Depression -No home meds   #T2DM --A1c 5.7. --d/c'ed BG checks and SSI  Likely dementia --pt alert, but not oriented.  Confused.   #Chronic Gout     #FTT #Falls Risk She uses a walker at baseline and will use a wheelchair which she goes to the doctor's office. Recent multiple falls with decreased po intake --PT/OT   #OSA not on CPAP Remote hx of Asthma/COPD   DVT prophylaxis: On:Eliquis  Code Status: Full code  Family Communication:  Level of care: Med-Surg Dispo:   The  patient is from: home Anticipated d/c is to: SNF rehab Anticipated d/c date is: whenever SNF ready to accept   Subjective and Interval History:  Pt was pleasantly confused.  Offered no complaint.     Objective: Vitals:   10/22/23 0420 10/22/23 0528 10/22/23 0811 10/22/23 1542  BP: (!) 157/77  (!) 157/79 (!) 155/78  Pulse: 86  70 84  Resp: 18  16 16   Temp: 98.7 F (37.1 C)  97.9 F (36.6 C) 98.3 F (36.8 C)  TempSrc:   Oral   SpO2: 97%  100% 100%  Weight:  99.8 kg    Height:        Intake/Output Summary (Last 24 hours) at 10/22/2023 1626 Last data filed at 10/22/2023 0552 Gross per 24 hour  Intake 337 ml  Output 700 ml  Net -363 ml   Filed Weights   10/20/23 0300 10/21/23 0300 10/22/23 0528  Weight: 101.2 kg 101.5 kg 99.8 kg    Examination:   Constitutional: NAD, alert, oriented to self only HEENT: conjunctivae and lids normal, EOMI CV: No cyanosis.   RESP: normal respiratory effort, on RA   Data Reviewed: I have personally reviewed labs and imaging studies  Time spent: 35 minutes  Ellouise Haber, MD [facility] If 7PM-7AM, please contact night-coverage 10/22/2023, 4:26 PM

## 2023-10-22 NOTE — Progress Notes (Signed)
 [facility]  PROGRESS NOTE   Subjective:   Patient is awake and alert.  Been feeling well.  Objective:  Vital signs: Blood pressure (!) 157/79, pulse 70, temperature 97.9 F (36.6 C), temperature source Oral, resp. rate 16, height 5' 3 (1.6 m), weight 99.8 kg, SpO2 100%.  Intake/Output Summary (Last 24 hours) at 10/22/2023 1438 Last data filed at 10/22/2023 0552 Gross per 24 hour  Intake 337 ml  Output 700 ml  Net -363 ml   Filed Weights   10/20/23 0300 10/21/23 0300 10/22/23 0528  Weight: 101.2 kg 101.5 kg 99.8 kg     Physical Exam: General:  No acute distress  Head:  Normocephalic, atraumatic. Moist oral mucosal membranes  Eyes:  Anicteric  Neck:  Supple  Lungs:   Clear to auscultation, normal effort  Heart:  S1S2 no rubs  Abdomen:   Soft, nontender, bowel sounds present  Extremities:  peripheral edema.  Neurologic:  Awake, alert, following commands  Skin:  No lesions  Access:     Basic Metabolic Panel: Recent Labs  Lab 10/18/23 0409 10/18/23 1532 10/19/23 0405 10/19/23 1615 10/20/23 0431 10/21/23 0418 10/22/23 0515  NA 137 136 138 135 137 141 141  K 3.6 3.8 3.5 3.4* 3.3* 3.6 3.3*  CL 102 102 105 102 102 105 104  CO2 23 20* 24 24 25 26 27   GLUCOSE 107* 163* 118* 259* 162* 128* 232*  BUN 42* 30* 20 17 22  32* 37*  CREATININE 3.23* 2.20* 1.45* 1.08* 1.67* 2.54* 2.44*  CALCIUM  6.9* 7.3* 7.5* 8.0* 8.5* 8.7* 8.6*  MG 2.1  --  2.3  --  2.5* 2.1 1.9  PHOS 3.2 3.0 2.2* 1.9* 4.0 4.6  --    GFR: Estimated Creatinine Clearance: 22.1 mL/min (A) (by C-G formula based on SCr of 2.44 mg/dL (H)).  Liver Function Tests: Recent Labs  Lab 10/16/23 1520 10/17/23 0441 10/17/23 1703 10/18/23 1532 10/19/23 0405 10/19/23 1615 10/20/23 0431 10/21/23 0418  AST 32 25  --   --   --   --   --   --   ALT 17 15  --   --   --   --   --   --   ALKPHOS 83 76  --   --   --   --   --   --   BILITOT 0.7 0.8  --   --   --   --   --   --   PROT 7.0 5.9*  --   --    --   --   --   --   ALBUMIN  3.4* 2.9*   < > 2.9* 2.6* 3.3* 3.2* 3.1*   < > = values in this interval not displayed.   No results for input(s): LIPASE, AMYLASE in the last 168 hours. No results for input(s): AMMONIA in the last 168 hours.  CBC: Recent Labs  Lab 10/18/23 0409 10/19/23 0405 10/20/23 0431 10/21/23 0418 10/22/23 0515  WBC 11.2* 7.0 8.0 10.1 10.4  HGB 9.7* 8.7* 9.1* 9.6* 9.7*  HCT 28.6* 25.3* 26.6* 28.8* 28.8*  MCV 93.5 93.0 94.0 95.0 92.9  PLT 182 118* 112* 113* 121*     HbA1C: Hgb A1c MFr Bld  Date/Time Value Ref Range Status  10/17/2023 04:41 AM 5.7 (H) 4.8 - 5.6 % Final    Comment:    (NOTE)         Prediabetes: 5.7 - 6.4  Diabetes: >6.4         Glycemic control for adults with diabetes: <7.0   01/17/2021 04:43 AM 11.5 (H) 4.8 - 5.6 % Final    Comment:    (NOTE)         Prediabetes: 5.7 - 6.4         Diabetes: >6.4         Glycemic control for adults with diabetes: <7.0     Urinalysis: No results for input(s): COLORURINE, LABSPEC, PHURINE, GLUCOSEU, HGBUR, BILIRUBINUR, KETONESUR, PROTEINUR, UROBILINOGEN, NITRITE, LEUKOCYTESUR in the last 72 hours.  Invalid input(s): APPERANCEUR    Imaging: No results found.   Medications:    anticoagulant sodium citrate       apixaban   5 mg Oral BID   atorvastatin   20 mg Oral Daily   Chlorhexidine  Gluconate Cloth  6 each Topical Daily   famotidine   20 mg Oral BID   feeding supplement  1 Container Oral TID BM   levETIRAcetam   500 mg Oral BID   traZODone   50 mg Oral QHS    Assessment/ Plan:     78 y.o.  female with past medical conditions including GERD, CVA, hypertension, diabetes, hyperlipidemia, CAD, gout, obstructive sleep apnea, UTI, status post pacemaker placement, chronic kidney disease stage III, who was admitted to [facility] on 10/16/2023 for Altered mental status [R41.82]    #1: Acute kidney injury: Patient had oliguric renal failure.  She was started on CRRT.   She is presently off of CRRT.  Creatinine levels are stabilizing.  Urine output is good.   #2: Sepsis secondary to UTI: She was on ceftriaxone , vancomycin  and metronidazole .   #3: Acute metabolic acidosis: This is resolved at this time.   #4: Diabetes: Continue insulin  as per protocol.  #5: Hypokalemia: Supplement 40 mg of potassium today.    Labs and medications reviewed. Will continue to follow along with you.   LOS: 6 Pinkey Edman, MD [facility] 7/20/20252:38 PM

## 2023-10-22 NOTE — Plan of Care (Signed)
   Problem: Education: Goal: Knowledge of General Education information will improve Description: Including pain rating scale, medication(s)/side effects and non-pharmacologic comfort measures Outcome: Not Progressing   Problem: Health Behavior/Discharge Planning: Goal: Ability to manage health-related needs will improve Outcome: Not Progressing   Problem: Activity: Goal: Risk for activity intolerance will decrease Outcome: Not Progressing

## 2023-10-23 DIAGNOSIS — R410 Disorientation, unspecified: Secondary | ICD-10-CM | POA: Diagnosis not present

## 2023-10-23 LAB — COMPREHENSIVE METABOLIC PANEL WITH GFR
ALT: 13 U/L (ref 0–44)
AST: 14 U/L — ABNORMAL LOW (ref 15–41)
Albumin: 3.3 g/dL — ABNORMAL LOW (ref 3.5–5.0)
Alkaline Phosphatase: 74 U/L (ref 38–126)
Anion gap: 14 (ref 5–15)
BUN: 35 mg/dL — ABNORMAL HIGH (ref 8–23)
CO2: 26 mmol/L (ref 22–32)
Calcium: 9.1 mg/dL (ref 8.9–10.3)
Chloride: 101 mmol/L (ref 98–111)
Creatinine, Ser: 2.27 mg/dL — ABNORMAL HIGH (ref 0.44–1.00)
GFR, Estimated: 22 mL/min — ABNORMAL LOW (ref >60)
Glucose, Bld: 224 mg/dL — ABNORMAL HIGH (ref 70–99)
Potassium: 3.5 mmol/L (ref 3.5–5.1)
Sodium: 141 mmol/L (ref 135–145)
Total Bilirubin: 0.9 mg/dL (ref 0.0–1.2)
Total Protein: 6.3 g/dL — ABNORMAL LOW (ref 6.5–8.1)

## 2023-10-23 LAB — CBC
HCT: 31.7 % — ABNORMAL LOW (ref 36.0–46.0)
Hemoglobin: 10.5 g/dL — ABNORMAL LOW (ref 12.0–15.0)
MCH: 31.3 pg (ref 26.0–34.0)
MCHC: 33.1 g/dL (ref 30.0–36.0)
MCV: 94.3 fL (ref 80.0–100.0)
Platelets: 139 K/uL — ABNORMAL LOW (ref 150–400)
RBC: 3.36 MIL/uL — ABNORMAL LOW (ref 3.87–5.11)
RDW: 13.5 % (ref 11.5–15.5)
WBC: 10.6 K/uL — ABNORMAL HIGH (ref 4.0–10.5)
nRBC: 0 % (ref 0.0–0.2)

## 2023-10-23 LAB — MAGNESIUM: Magnesium: 1.6 mg/dL — ABNORMAL LOW (ref 1.7–2.4)

## 2023-10-23 MED ORDER — MAGNESIUM SULFATE 2 GM/50ML IV SOLN
2.0000 g | Freq: Once | INTRAVENOUS | Status: AC
  Administered 2023-10-23: 2 g via INTRAVENOUS
  Filled 2023-10-23: qty 50

## 2023-10-23 MED ORDER — ADULT MULTIVITAMIN W/MINERALS CH
1.0000 | ORAL_TABLET | Freq: Every day | ORAL | Status: DC
  Administered 2023-10-24 – 2023-10-30 (×7): 1 via ORAL
  Filled 2023-10-23 (×7): qty 1

## 2023-10-23 MED ORDER — ENSURE PLUS HIGH PROTEIN PO LIQD: 237.0000 mL | Freq: Three times a day (TID) | ORAL | Status: DC

## 2023-10-23 MED ORDER — FAMOTIDINE 20 MG PO TABS
20.0000 mg | ORAL_TABLET | Freq: Every day | ORAL | Status: DC
  Administered 2023-10-23 – 2023-10-30 (×8): 20 mg via ORAL
  Filled 2023-10-23 (×8): qty 1

## 2023-10-23 NOTE — Progress Notes (Signed)
  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 Coli and Enterococcus faecalis UTI who presented to the ED with chief complaints of altered mental status.   Admitted to ICU for pressor and CRRT.  Transferred to [facility] on 10/21/23.   #Septic shock - PRESENT ON ADMISSION - due to UTI and probable intrabdominal source (nv and diarrhea) Hx of E. Coli and Enterococcus faecalis UTI  Presenting with altered mental status, UA positive for UTI --completed 5 days of ceftriaxone  and flagyl   #AKI on CKD stage III Patient had oliguric renal failure. She was started on CRRT. She is presently off of CRRT. Creatinine levels are stabilizing. Urine output is good.  --management per nephro  Acute metabolic acidosis, resolved Hyperkalemia, resolved  Hypokalemia --monitor and supplement per nephro  Hypomag --monitor and supplement PRN   #Paroxysmal Afib  Dual-chamber pacemaker for complete heart block --cont Eliquis   #HTN:  --BP intermittently low --hold home Lisinopril   #Hx of seizures -Continued home keppra    #Anxiety and Depression -No home meds   #T2DM --A1c 5.7. --d/c'ed BG checks and SSI  Likely dementia --pt alert, but not oriented.  Confused.   #Chronic Gout     #FTT #Falls Risk She uses a walker at baseline and will use a wheelchair which she goes to the doctor's office. Recent multiple falls with decreased po intake --PT/OT   #OSA not on CPAP Remote hx of Asthma/COPD   DVT prophylaxis: On:Eliquis  Code Status: Full code  Family Communication:  Level of care: Med-Surg Dispo:   The  patient is from: home Anticipated d/c is to: SNF rehab Anticipated d/c date is: whenever SNF ready to accept   Subjective and Interval History:  No new event.   Objective: Vitals:   10/23/23 0309 10/23/23 0520 10/23/23 0735 10/23/23 1458  BP: (!) 142/89  (!) 154/83 133/80  Pulse: 67  77 95  Resp: 17  15 16   Temp:   98 F (36.7 C) 98.2 F (36.8 C)  TempSrc:      SpO2: 99%  99% 99%  Weight:  97.6 kg    Height:        Intake/Output Summary (Last 24 hours) at 10/23/2023 1708 Last data filed at 10/23/2023 1500 Gross per 24 hour  Intake 487.5 ml  Output 700 ml  Net -212.5 ml   Filed Weights   10/21/23 0300 10/22/23 0528 10/23/23 0520  Weight: 101.5 kg 99.8 kg 97.6 kg    Examination:   Constitutional: NAD CV: No cyanosis.   RESP: normal respiratory effort, on RA   Data Reviewed: I have personally reviewed labs and imaging studies  Time spent: 25 minutes  Ellouise Haber, MD [facility] If 7PM-7AM, please contact night-coverage 10/23/2023, 5:08 PM

## 2023-10-23 NOTE — Plan of Care (Signed)
  Problem: Clinical Measurements: Goal: Respiratory complications will improve Outcome: Progressing   Problem: Coping: Goal: Level of anxiety will decrease Outcome: Progressing   Problem: Elimination: Goal: Will not experience complications related to bowel motility Outcome: Progressing Goal: Will not experience complications related to urinary retention Outcome: Progressing

## 2023-10-23 NOTE — Progress Notes (Signed)
 [facility]  ROUNDING NOTE   Subjective:   Patient seen sitting up in bed Alert and oriented Tolerating meals without nausea or vomiting Room air  Creatinine 2.27 UOP overnight  Objective:  Vital signs in last 24 hours:  Temp:  [97.7 F (36.5 C)-98.3 F (36.8 C)] 98 F (36.7 C) (07/21 0735) Pulse Rate:  [67-93] 77 (07/21 0735) Resp:  [15-18] 15 (07/21 0735) BP: (142-159)/(78-104) 154/83 (07/21 0735) SpO2:  [98 %-100 %] 99 % (07/21 0735) Weight:  [97.6 kg] 97.6 kg (07/21 0520)  Weight change: -2.2 kg Filed Weights   10/21/23 0300 10/22/23 0528 10/23/23 0520  Weight: 101.5 kg 99.8 kg 97.6 kg    Intake/Output: I/O last 3 completed shifts: In: 814 [P.O.:804; IV Piggyback:10] Out: 1400 [Urine:1400]   Intake/Output this shift:  No intake/output data recorded.  Physical Exam: General: NAD  Head: Normocephalic, atraumatic. Moist oral mucosal membranes  Eyes: Anicteric  Neck: Supple  Lungs:  Clear to auscultation  Heart: Regular rate and rhythm  Abdomen:  Soft, nontender  Extremities:  No peripheral edema.  Neurologic: Awake, alert, conversant  Skin: Warm,dry, no rash  Access: Rt internal jugular temp HD cath    Basic Metabolic Panel: Recent Labs  Lab 10/18/23 1532 10/19/23 0405 10/19/23 1615 10/20/23 0431 10/21/23 0418 10/22/23 0515 10/23/23 0637  NA 136 138 135 137 141 141 141  K 3.8 3.5 3.4* 3.3* 3.6 3.3* 3.5  CL 102 105 102 102 105 104 101  CO2 20* 24 24 25 26 27 26   GLUCOSE 163* 118* 259* 162* 128* 232* 224*  BUN 30* 20 17 22  32* 37* 35*  CREATININE 2.20* 1.45* 1.08* 1.67* 2.54* 2.44* 2.27*  CALCIUM  7.3* 7.5* 8.0* 8.5* 8.7* 8.6* 9.1  MG  --  2.3  --  2.5* 2.1 1.9 1.6*  PHOS 3.0 2.2* 1.9* 4.0 4.6  --   --     Liver Function Tests: Recent Labs  Lab 10/16/23 1520 10/17/23 0441 10/17/23 1703 10/19/23 0405 10/19/23 1615 10/20/23 0431 10/21/23 0418 10/23/23 0637  AST 32 25  --   --   --   --   --  14*  ALT 17 15  --   --    --   --   --  13  ALKPHOS 83 76  --   --   --   --   --  74  BILITOT 0.7 0.8  --   --   --   --   --  0.9  PROT 7.0 5.9*  --   --   --   --   --  6.3*  ALBUMIN  3.4* 2.9*   < > 2.6* 3.3* 3.2* 3.1* 3.3*   < > = values in this interval not displayed.   No results for input(s): LIPASE, AMYLASE in the last 168 hours. No results for input(s): AMMONIA in the last 168 hours.  CBC: Recent Labs  Lab 10/19/23 0405 10/20/23 0431 10/21/23 0418 10/22/23 0515 10/23/23 0637  WBC 7.0 8.0 10.1 10.4 10.6*  HGB 8.7* 9.1* 9.6* 9.7* 10.5*  HCT 25.3* 26.6* 28.8* 28.8* 31.7*  MCV 93.0 94.0 95.0 92.9 94.3  PLT 118* 112* 113* 121* 139*    Cardiac Enzymes: Recent Labs  Lab 10/16/23 1520  CKTOTAL 230    BNP: Invalid input(s): POCBNP  CBG: Recent Labs  Lab 10/20/23 1552 10/20/23 1916 10/20/23 2311 10/21/23 0311 10/21/23 0710  GLUCAP 126* 109* 155* 121* 102*    Microbiology: Results  for orders placed or performed during the hospital encounter of 10/16/23  Urine Culture (for pregnant, neutropenic or urologic patients or patients with an indwelling urinary catheter)     Status: Abnormal   Collection Time: 10/16/23 10:04 PM   Specimen: Urine, Random  Result Value Ref Range Status   Specimen Description   Final    URINE, RANDOM Performed at [facility], [address] [place], [place] [address]    Special Requests   Final    NONE Performed at [facility], [address] [place], [place] [address]    Culture 80,000 COLONIES/mL ESCHERICHIA COLI (A)  Final   Report Status 10/19/2023 FINAL  Final   Organism ID, Bacteria ESCHERICHIA COLI (A)  Final      Susceptibility   Escherichia coli - MIC*    AMPICILLIN >=32 RESISTANT Resistant     CEFAZOLIN  <=4 SENSITIVE Sensitive     CEFEPIME <=0.12 SENSITIVE Sensitive     CEFTRIAXONE  <=0.25 SENSITIVE Sensitive     CIPROFLOXACIN <=0.25 SENSITIVE Sensitive     GENTAMICIN 8 INTERMEDIATE Intermediate     IMIPENEM  <=0.25 SENSITIVE Sensitive     NITROFURANTOIN <=16 SENSITIVE Sensitive     TRIMETH/SULFA >=320 RESISTANT Resistant     AMPICILLIN/SULBACTAM 4 SENSITIVE Sensitive     PIP/TAZO <=4 SENSITIVE Sensitive ug/mL    * 80,000 COLONIES/mL ESCHERICHIA COLI  MRSA Next Gen by PCR, Nasal     Status: None   Collection Time: 10/16/23 11:05 PM   Specimen: Nasal Mucosa; Nasal Swab  Result Value Ref Range Status   MRSA by PCR Next Gen NOT DETECTED NOT DETECTED Final    Comment: (NOTE) The GeneXpert MRSA Assay (FDA approved for NASAL specimens only), is one component of a comprehensive MRSA colonization surveillance program. It is not intended to diagnose MRSA infection nor to guide or monitor treatment for MRSA infections. Test performance is not FDA approved in patients less than 24 years old. Performed at [facility], [address] [place], [place] [address]   Resp panel by RT-PCR (RSV, Flu A&B, Covid) Anterior Nasal Swab     Status: None   Collection Time: 10/17/23 10:03 AM   Specimen: Anterior Nasal Swab  Result Value Ref Range Status   SARS Coronavirus 2 by RT PCR NEGATIVE NEGATIVE Final    Comment: (NOTE) SARS-CoV-2 target nucleic acids are NOT DETECTED.  The SARS-CoV-2 RNA is generally detectable in upper respiratory specimens during the acute phase of infection. The lowest concentration of SARS-CoV-2 viral copies this assay can detect is 138 copies/mL. A negative result does not preclude SARS-Cov-2 infection and should not be used as the sole basis for treatment or other patient management decisions. A negative result may occur with  improper specimen collection/handling, submission of specimen other than nasopharyngeal swab, presence of viral mutation(s) within the areas targeted by this assay, and inadequate number of viral copies(<138 copies/mL). A negative result must be combined with clinical observations, patient history, and epidemiological information. The expected  result is Negative.  Fact Sheet for Patients:  BloggerCourse.com  Fact Sheet for Healthcare Providers:  SeriousBroker.it  This test is no t yet approved or cleared by the [place]  FDA and  has been authorized for detection and/or diagnosis of SARS-CoV-2 by FDA under an Emergency Use Authorization (EUA). This EUA will remain  in effect (meaning this test can be used) for the duration of the COVID-19 declaration under Section 564(b)(1) of the Act, 21 U.S.C.section 360bbb-3(b)(1), unless the  authorization is terminated  or revoked sooner.       Influenza A by PCR NEGATIVE NEGATIVE Final   Influenza B by PCR NEGATIVE NEGATIVE Final    Comment: (NOTE) The Xpert Xpress SARS-CoV-2/FLU/RSV plus assay is intended as an aid in the diagnosis of influenza from Nasopharyngeal swab specimens and should not be used as a sole basis for treatment. Nasal washings and aspirates are unacceptable for Xpert Xpress SARS-CoV-2/FLU/RSV testing.  Fact Sheet for Patients: BloggerCourse.com  Fact Sheet for Healthcare Providers: SeriousBroker.it  This test is not yet approved or cleared by the [place]  FDA and has been authorized for detection and/or diagnosis of SARS-CoV-2 by FDA under an Emergency Use Authorization (EUA). This EUA will remain in effect (meaning this test can be used) for the duration of the COVID-19 declaration under Section 564(b)(1) of the Act, 21 U.S.C. section 360bbb-3(b)(1), unless the authorization is terminated or revoked.     Resp Syncytial Virus by PCR NEGATIVE NEGATIVE Final    Comment: (NOTE) Fact Sheet for Patients: BloggerCourse.com  Fact Sheet for Healthcare Providers: SeriousBroker.it  This test is not yet approved or cleared by the [place]  FDA and has been authorized for detection and/or diagnosis of  SARS-CoV-2 by FDA under an Emergency Use Authorization (EUA). This EUA will remain in effect (meaning this test can be used) for the duration of the COVID-19 declaration under Section 564(b)(1) of the Act, 21 U.S.C. section 360bbb-3(b)(1), unless the authorization is terminated or revoked.  Performed at [facility], [address] [place], [place] [address]   Respiratory (~20 pathogens) panel by PCR     Status: None   Collection Time: 10/17/23 10:03 AM   Specimen: Nasopharyngeal Swab; Respiratory  Result Value Ref Range Status   Adenovirus NOT DETECTED NOT DETECTED Final   Coronavirus 229E NOT DETECTED NOT DETECTED Final    Comment: (NOTE) The Coronavirus on the Respiratory Panel, DOES NOT test for the novel  Coronavirus (2019 nCoV)    Coronavirus HKU1 NOT DETECTED NOT DETECTED Final   Coronavirus NL63 NOT DETECTED NOT DETECTED Final   Coronavirus OC43 NOT DETECTED NOT DETECTED Final   Metapneumovirus NOT DETECTED NOT DETECTED Final   Rhinovirus / Enterovirus NOT DETECTED NOT DETECTED Final   Influenza A NOT DETECTED NOT DETECTED Final   Influenza B NOT DETECTED NOT DETECTED Final   Parainfluenza Virus 1 NOT DETECTED NOT DETECTED Final   Parainfluenza Virus 2 NOT DETECTED NOT DETECTED Final   Parainfluenza Virus 3 NOT DETECTED NOT DETECTED Final   Parainfluenza Virus 4 NOT DETECTED NOT DETECTED Final   Respiratory Syncytial Virus NOT DETECTED NOT DETECTED Final   Bordetella pertussis NOT DETECTED NOT DETECTED Final   Bordetella Parapertussis NOT DETECTED NOT DETECTED Final   Chlamydophila pneumoniae NOT DETECTED NOT DETECTED Final   Mycoplasma pneumoniae NOT DETECTED NOT DETECTED Final    Comment: Performed at [facility], [address], [place], [place] [address]  Culture, blood (Routine X 2) w Reflex to ID Panel     Status: None   Collection Time: 10/17/23 11:05 AM   Specimen: BLOOD  Result Value Ref Range Status   Specimen Description BLOOD BLOOD RIGHT  HAND  Final   Special Requests   Final    BOTTLES DRAWN AEROBIC ONLY Blood Culture results may not be optimal due to an inadequate volume of blood received in culture bottles   Culture   Final    NO GROWTH 5 DAYS Performed at [facility], [address] [place], [place] [address]    Report Status 10/22/2023 FINAL  Final    Coagulation Studies: No results for input(s): LABPROT, INR in the last 72 hours.   Urinalysis: No results for input(s): COLORURINE, LABSPEC, PHURINE, GLUCOSEU, HGBUR, BILIRUBINUR, KETONESUR, PROTEINUR, UROBILINOGEN, NITRITE, LEUKOCYTESUR in the last 72 hours.  Invalid input(s): APPERANCEUR     Imaging: No results found.    Medications:    anticoagulant sodium citrate      magnesium  sulfate bolus IVPB      apixaban   5 mg Oral BID   atorvastatin   20 mg Oral Daily   Chlorhexidine  Gluconate Cloth  6 each Topical Daily   famotidine   20 mg Oral Daily   feeding supplement  1 Container Oral TID BM   levETIRAcetam   500 mg Oral BID   traZODone   50 mg Oral QHS   acetaminophen , anticoagulant sodium citrate , loperamide   Assessment/ Plan:  [name] is a 78 y.o.  female with past medical conditions including GERD, CVA, hypertension, diabetes, hyperlipidemia, CAD, gout, obstructive sleep apnea, UTI, chronic kidney disease stage III, who was admitted to [facility] on 10/16/2023 for Altered mental status [R41.82] Altered mental status, unspecified altered mental status type [R41.82]   Acute kidney injury likely secondary to septic UTI. Baseline unknown. UA positive for leukocytes and bacteria. No recent IV contrast exposure. Creatinine on ED arrival 8.32.  Patient did receive CRRT for short period.  Renal function has improved.  Due to unknown baseline, we will have to monitor creatinine.  Will place order to remove HD temp cath.  Patient will need follow-up in our office at discharge to continue monitoring.  Lab Results   Component Value Date   CREATININE 2.27 (H) 10/23/2023   CREATININE 2.44 (H) 10/22/2023   CREATININE 2.54 (H) 10/21/2023    Intake/Output Summary (Last 24 hours) at 10/23/2023 1332 Last data filed at 10/23/2023 0645 Gross per 24 hour  Intake 477 ml  Output 700 ml  Net -223 ml   2.  Hypokalemia/hypermagnesium.  Potassium 3.5.  Magnesium  1.6.  Continue IV supplementation of magnesium .   3.  Acute metabolic acidosis.  Serum bicarb 10 on ED arrival.  Corrected with dialysis and IV supplementation.       4.  Sepsis likely secondary to UTI with suspected abdominal involvement based on nausea, vomiting, and diarrhea.  UA positive for UTI, urine culture positive for E. coli.  Patient does have history of E. coli and Enterococcus facilities UTI.  All antibiotics completed.    LOS: 7 Traci Carter 7/21/20251:32 PM

## 2023-10-24 DIAGNOSIS — R410 Disorientation, unspecified: Secondary | ICD-10-CM | POA: Diagnosis not present

## 2023-10-24 LAB — BASIC METABOLIC PANEL WITH GFR
Anion gap: 12 (ref 5–15)
BUN: 33 mg/dL — ABNORMAL HIGH (ref 8–23)
CO2: 26 mmol/L (ref 22–32)
Calcium: 8.8 mg/dL — ABNORMAL LOW (ref 8.9–10.3)
Chloride: 103 mmol/L (ref 98–111)
Creatinine, Ser: 1.92 mg/dL — ABNORMAL HIGH (ref 0.44–1.00)
GFR, Estimated: 27 mL/min — ABNORMAL LOW (ref >60)
Glucose, Bld: 295 mg/dL — ABNORMAL HIGH (ref 70–99)
Potassium: 3.7 mmol/L (ref 3.5–5.1)
Sodium: 141 mmol/L (ref 135–145)

## 2023-10-24 LAB — SEROTONIN RELEASE ASSAY (SRA)
SRA .2 IU/mL UFH Ser-aCnc: <1 % (ref 0–20)
SRA 100IU/mL UFH Ser-aCnc: <1 % (ref 0–20)

## 2023-10-24 LAB — MAGNESIUM: Magnesium: 1.7 mg/dL (ref 1.7–2.4)

## 2023-10-24 MED ORDER — VITAMIN C 500 MG PO TABS
500.0000 mg | ORAL_TABLET | Freq: Two times a day (BID) | ORAL | Status: DC
  Administered 2023-10-24 – 2023-10-30 (×12): 500 mg via ORAL
  Filled 2023-10-24 (×12): qty 1

## 2023-10-24 NOTE — Progress Notes (Signed)
 [facility]  ROUNDING NOTE   Subjective:   Patient seen sitting up in bed Partially completed breakfast tray at bedside States she feels well, slept well HD temp cath removed from right neck Room air Clear yellow urine noted in canister  Creatinine 1.92 UOP overnight  Objective:  Vital signs in last 24 hours:  Temp:  [97.8 F (36.6 C)-98.2 F (36.8 C)] 98 F (36.7 C) (07/22 0912) Pulse Rate:  [50-95] 91 (07/22 0912) Resp:  [16-18] 16 (07/22 0912) BP: (133-159)/(37-89) 157/71 (07/22 0912) SpO2:  [94 %-99 %] 98 % (07/22 0912) Weight:  [99.5 kg] 99.5 kg (07/22 0329)  Weight change: 1.9 kg Filed Weights   10/22/23 0528 10/23/23 0520 10/24/23 0329  Weight: 99.8 kg 97.6 kg 99.5 kg    Intake/Output: I/O last 3 completed shifts: In: 487.5 [P.O.:477; IV Piggyback:10.5] Out: 1400 [Urine:1400]   Intake/Output this shift:  No intake/output data recorded.  Physical Exam: General: NAD  Head: Normocephalic, atraumatic. Moist oral mucosal membranes  Eyes: Anicteric  Neck: Supple  Lungs:  Clear to auscultation, normal effort  Heart: Regular rate and rhythm  Abdomen:  Soft, nontender  Extremities:  No peripheral edema.  Neurologic: Awake, alert, conversant  Skin: Warm,dry, no rash  Access: Removed    Basic Metabolic Panel: Recent Labs  Lab 10/18/23 1532 10/19/23 0405 10/19/23 1615 10/20/23 0431 10/21/23 0418 10/22/23 0515 10/23/23 0637 10/24/23 0458  NA 136 138 135 137 141 141 141 141  K 3.8 3.5 3.4* 3.3* 3.6 3.3* 3.5 3.7  CL 102 105 102 102 105 104 101 103  CO2 20* 24 24 25 26 27 26 26   GLUCOSE 163* 118* 259* 162* 128* 232* 224* 295*  BUN 30* 20 17 22  32* 37* 35* 33*  CREATININE 2.20* 1.45* 1.08* 1.67* 2.54* 2.44* 2.27* 1.92*  CALCIUM  7.3* 7.5* 8.0* 8.5* 8.7* 8.6* 9.1 8.8*  MG  --  2.3  --  2.5* 2.1 1.9 1.6* 1.7  PHOS 3.0 2.2* 1.9* 4.0 4.6  --   --   --     Liver Function Tests: Recent Labs  Lab 10/19/23 0405 10/19/23 1615  10/20/23 0431 10/21/23 0418 10/23/23 0637  AST  --   --   --   --  14*  ALT  --   --   --   --  13  ALKPHOS  --   --   --   --  74  BILITOT  --   --   --   --  0.9  PROT  --   --   --   --  6.3*  ALBUMIN  2.6* 3.3* 3.2* 3.1* 3.3*   No results for input(s): LIPASE, AMYLASE in the last 168 hours. No results for input(s): AMMONIA in the last 168 hours.  CBC: Recent Labs  Lab 10/19/23 0405 10/20/23 0431 10/21/23 0418 10/22/23 0515 10/23/23 0637  WBC 7.0 8.0 10.1 10.4 10.6*  HGB 8.7* 9.1* 9.6* 9.7* 10.5*  HCT 25.3* 26.6* 28.8* 28.8* 31.7*  MCV 93.0 94.0 95.0 92.9 94.3  PLT 118* 112* 113* 121* 139*    Cardiac Enzymes: No results for input(s): CKTOTAL, CKMB, CKMBINDEX, TROPONINI in the last 168 hours.   BNP: Invalid input(s): POCBNP  CBG: Recent Labs  Lab 10/20/23 1552 10/20/23 1916 10/20/23 2311 10/21/23 0311 10/21/23 0710  GLUCAP 126* 109* 155* 121* 102*    Microbiology: Results for orders placed or performed during the hospital encounter of 10/16/23  Urine Culture (for pregnant, neutropenic or  urologic patients or patients with an indwelling urinary catheter)     Status: Abnormal   Collection Time: 10/16/23 10:04 PM   Specimen: Urine, Random  Result Value Ref Range Status   Specimen Description   Final    URINE, RANDOM Performed at [facility], [address], [place], [place] [address]    Special Requests   Final    NONE Performed at [facility], [address] [place], [place] [address]    Culture 80,000 COLONIES/mL ESCHERICHIA COLI (A)  Final   Report Status 10/19/2023 FINAL  Final   Organism ID, Bacteria ESCHERICHIA COLI (A)  Final      Susceptibility   Escherichia coli - MIC*    AMPICILLIN >=32 RESISTANT Resistant     CEFAZOLIN  <=4 SENSITIVE Sensitive     CEFEPIME <=0.12 SENSITIVE Sensitive     CEFTRIAXONE  <=0.25 SENSITIVE Sensitive     CIPROFLOXACIN <=0.25 SENSITIVE Sensitive     GENTAMICIN 8 INTERMEDIATE  Intermediate     IMIPENEM <=0.25 SENSITIVE Sensitive     NITROFURANTOIN <=16 SENSITIVE Sensitive     TRIMETH/SULFA >=320 RESISTANT Resistant     AMPICILLIN/SULBACTAM 4 SENSITIVE Sensitive     PIP/TAZO <=4 SENSITIVE Sensitive ug/mL    * 80,000 COLONIES/mL ESCHERICHIA COLI  MRSA Next Gen by PCR, Nasal     Status: None   Collection Time: 10/16/23 11:05 PM   Specimen: Nasal Mucosa; Nasal Swab  Result Value Ref Range Status   MRSA by PCR Next Gen NOT DETECTED NOT DETECTED Final    Comment: (NOTE) The GeneXpert MRSA Assay (FDA approved for NASAL specimens only), is one component of a comprehensive MRSA colonization surveillance program. It is not intended to diagnose MRSA infection nor to guide or monitor treatment for MRSA infections. Test performance is not FDA approved in patients less than 80 years old. Performed at [facility], [address] [place], [place] [address]   Resp panel by RT-PCR (RSV, Flu A&B, Covid) Anterior Nasal Swab     Status: None   Collection Time: 10/17/23 10:03 AM   Specimen: Anterior Nasal Swab  Result Value Ref Range Status   SARS Coronavirus 2 by RT PCR NEGATIVE NEGATIVE Final    Comment: (NOTE) SARS-CoV-2 target nucleic acids are NOT DETECTED.  The SARS-CoV-2 RNA is generally detectable in upper respiratory specimens during the acute phase of infection. The lowest concentration of SARS-CoV-2 viral copies this assay can detect is 138 copies/mL. A negative result does not preclude SARS-Cov-2 infection and should not be used as the sole basis for treatment or other patient management decisions. A negative result may occur with  improper specimen collection/handling, submission of specimen other than nasopharyngeal swab, presence of viral mutation(s) within the areas targeted by this assay, and inadequate number of viral copies(<138 copies/mL). A negative result must be combined with clinical observations, patient history, and  epidemiological information. The expected result is Negative.  Fact Sheet for Patients:  BloggerCourse.com  Fact Sheet for Healthcare Providers:  SeriousBroker.it  This test is no t yet approved or cleared by the [place]  FDA and  has been authorized for detection and/or diagnosis of SARS-CoV-2 by FDA under an Emergency Use Authorization (EUA). This EUA will remain  in effect (meaning this test can be used) for the duration of the COVID-19 declaration under Section 564(b)(1) of the Act, 21 U.S.C.section 360bbb-3(b)(1), unless the authorization is terminated  or revoked sooner.       Influenza A by PCR NEGATIVE  NEGATIVE Final   Influenza B by PCR NEGATIVE NEGATIVE Final    Comment: (NOTE) The Xpert Xpress SARS-CoV-2/FLU/RSV plus assay is intended as an aid in the diagnosis of influenza from Nasopharyngeal swab specimens and should not be used as a sole basis for treatment. Nasal washings and aspirates are unacceptable for Xpert Xpress SARS-CoV-2/FLU/RSV testing.  Fact Sheet for Patients: BloggerCourse.com  Fact Sheet for Healthcare Providers: SeriousBroker.it  This test is not yet approved or cleared by the [place]  FDA and has been authorized for detection and/or diagnosis of SARS-CoV-2 by FDA under an Emergency Use Authorization (EUA). This EUA will remain in effect (meaning this test can be used) for the duration of the COVID-19 declaration under Section 564(b)(1) of the Act, 21 U.S.C. section 360bbb-3(b)(1), unless the authorization is terminated or revoked.     Resp Syncytial Virus by PCR NEGATIVE NEGATIVE Final    Comment: (NOTE) Fact Sheet for Patients: BloggerCourse.com  Fact Sheet for Healthcare Providers: SeriousBroker.it  This test is not yet approved or cleared by the [place]  FDA and has been  authorized for detection and/or diagnosis of SARS-CoV-2 by FDA under an Emergency Use Authorization (EUA). This EUA will remain in effect (meaning this test can be used) for the duration of the COVID-19 declaration under Section 564(b)(1) of the Act, 21 U.S.C. section 360bbb-3(b)(1), unless the authorization is terminated or revoked.  Performed at [facility], [address] [place], [place] [address]   Respiratory (~20 pathogens) panel by PCR     Status: None   Collection Time: 10/17/23 10:03 AM   Specimen: Nasopharyngeal Swab; Respiratory  Result Value Ref Range Status   Adenovirus NOT DETECTED NOT DETECTED Final   Coronavirus 229E NOT DETECTED NOT DETECTED Final    Comment: (NOTE) The Coronavirus on the Respiratory Panel, DOES NOT test for the novel  Coronavirus (2019 nCoV)    Coronavirus HKU1 NOT DETECTED NOT DETECTED Final   Coronavirus NL63 NOT DETECTED NOT DETECTED Final   Coronavirus OC43 NOT DETECTED NOT DETECTED Final   Metapneumovirus NOT DETECTED NOT DETECTED Final   Rhinovirus / Enterovirus NOT DETECTED NOT DETECTED Final   Influenza A NOT DETECTED NOT DETECTED Final   Influenza B NOT DETECTED NOT DETECTED Final   Parainfluenza Virus 1 NOT DETECTED NOT DETECTED Final   Parainfluenza Virus 2 NOT DETECTED NOT DETECTED Final   Parainfluenza Virus 3 NOT DETECTED NOT DETECTED Final   Parainfluenza Virus 4 NOT DETECTED NOT DETECTED Final   Respiratory Syncytial Virus NOT DETECTED NOT DETECTED Final   Bordetella pertussis NOT DETECTED NOT DETECTED Final   Bordetella Parapertussis NOT DETECTED NOT DETECTED Final   Chlamydophila pneumoniae NOT DETECTED NOT DETECTED Final   Mycoplasma pneumoniae NOT DETECTED NOT DETECTED Final    Comment: Performed at [facility], [address] [place], [place] [address]  Culture, blood (Routine X 2) w Reflex to ID Panel     Status: None   Collection Time: 10/17/23 11:05 AM   Specimen: BLOOD  Result Value Ref Range  Status   Specimen Description BLOOD BLOOD RIGHT HAND  Final   Special Requests   Final    BOTTLES DRAWN AEROBIC ONLY Blood Culture results may not be optimal due to an inadequate volume of blood received in culture bottles   Culture   Final    NO GROWTH 5 DAYS Performed at [facility], [address], [place], [place] [address]    Report Status 10/22/2023 FINAL  Final  Coagulation Studies: No results for input(s): LABPROT, INR in the last 72 hours.   Urinalysis: No results for input(s): COLORURINE, LABSPEC, PHURINE, GLUCOSEU, HGBUR, BILIRUBINUR, KETONESUR, PROTEINUR, UROBILINOGEN, NITRITE, LEUKOCYTESUR in the last 72 hours.  Invalid input(s): APPERANCEUR     Imaging: No results found.    Medications:    anticoagulant sodium citrate       apixaban   5 mg Oral BID   atorvastatin   20 mg Oral Daily   Chlorhexidine  Gluconate Cloth  6 each Topical Daily   famotidine   20 mg Oral Daily   feeding supplement  237 mL Oral TID BM   levETIRAcetam   500 mg Oral BID   multivitamin with minerals  1 tablet Oral Daily   traZODone   50 mg Oral QHS   acetaminophen , anticoagulant sodium citrate , loperamide   Assessment/ Plan:  Traci Carter is a 78 y.o.  female with past medical conditions including GERD, CVA, hypertension, diabetes, hyperlipidemia, CAD, gout, obstructive sleep apnea, UTI, chronic kidney disease stage III, who was admitted to [facility] on 10/16/2023 for Altered mental status [R41.82] Altered mental status, unspecified altered mental status type [R41.82]   Acute kidney injury likely secondary to septic UTI. Baseline unknown. UA positive for leukocytes and bacteria. No recent IV contrast exposure. Creatinine on ED arrival 8.32.  Patient did receive CRRT for short period.  Renal function continues to improve.  Urine noted in pure wick canister.  No acute indication for dialysis and appreciate nursing staff removing HD temp cath.  Will  continue to monitor patient during this admission and at discharge.  Lab Results  Component Value Date   CREATININE 1.92 (H) 10/24/2023   CREATININE 2.27 (H) 10/23/2023   CREATININE 2.44 (H) 10/22/2023    Intake/Output Summary (Last 24 hours) at 10/24/2023 1243 Last data filed at 10/24/2023 0900 Gross per 24 hour  Intake 10.5 ml  Output 700 ml  Net -689.5 ml   2.  Hypokalemia/hypermagnesium.  Potassium 3.5.  Magnesium  1.7.  Corrected with IV supplementation..   3.  Acute metabolic acidosis.  Serum bicarb 10 on ED arrival.  Corrected with dialysis and IV supplementation.       4.  Sepsis likely secondary to UTI with suspected abdominal involvement based on nausea, vomiting, and diarrhea.  UA positive for UTI, urine culture positive for E. coli.  Patient does have history of E. coli and Enterococcus facilities UTI.  All antibiotics completed.    LOS: 8 Traci Carter 7/22/202512:43 PM

## 2023-10-24 NOTE — Progress Notes (Signed)
 Occupational Therapy Treatment Patient Details Name: Traci Carter MRN: [id number] DOB: 1946/03/06 Today's Date: 10/24/2023   History of present illness Traci Carter is a 78yoF who comes to [facility] on 10/16/23 c AMS, diarrhea, blood in stool, emesis x2 days. Frequent falls recently at home. PMH: PAF, PPM s/p HB, HTN, CVA, DM2. Pt admitted with circulatory shock 2/2 UTI and AKI. Pt requires ICU level care and transferred to 1a from ICU on 10/21/23.   OT comments  Upon entering the room, pt supine in bed and needing maximal encouragement for participation. Pt stating,  I don't feel like it in regards to mobility. OT educating pt on all the health concerns she could occur if she doesn't start mobilizing. Once seated on EOB, pt maintains static sitting balance with supervision. She reports increased anxiety related to falls. Pt refusing all attempts to stand or transfer. Stedy utilized secondary to pt anxiety and pt stands on second attempt from standard bed height with mod assistance and stands inside of stedy for ~ 4 minutes. Pt needing mod A to return to supine. OT recommends staff utilize STEDY for safe transfers to get pt out of bed. Call bell and all needed items within reach upon exiting the room.       If plan is discharge home, recommend the following:  A lot of help with walking and/or transfers;A lot of help with bathing/dressing/bathroom;Assistance with cooking/housework;Assistance with feeding;Assist for transportation;Help with stairs or ramp for entrance;Direct supervision/assist for medications management   Equipment Recommendations  Other (comment) (defer to next venue of care)       Precautions / Restrictions Precautions Precautions: Fall Recall of Precautions/Restrictions: Impaired Restrictions Weight Bearing Restrictions Per Provider Order: No       Mobility Bed Mobility Overal bed mobility: Needs Assistance Bed Mobility: Supine to Sit, Sit to Supine     Supine to  sit: Mod assist Sit to supine: Mod assist        Transfers Overall transfer level: Needs assistance Equipment used: Rolling walker (2 wheels) Transfers: Sit to/from Stand Sit to Stand: Mod assist                 Balance Overall balance assessment: Needs assistance Sitting-balance support: Bilateral upper extremity supported, Feet supported Sitting balance-Leahy Scale: Fair                                     ADL either performed or assessed with clinical judgement    Extremity/Trunk Assessment Upper Extremity Assessment Upper Extremity Assessment: Generalized weakness   Lower Extremity Assessment Lower Extremity Assessment: Generalized weakness        Vision Patient Visual Report: No change from baseline           Communication Communication Communication: Impaired Factors Affecting Communication: Difficulty expressing self   Cognition Arousal: Alert Behavior During Therapy: WFL for tasks assessed/performed, Flat affect                                 Following commands: Impaired Following commands impaired: Follows one step commands inconsistently      Cueing   Cueing Techniques: Verbal cues, Gestural cues, Tactile cues, Visual cues             Pertinent Vitals/ Pain       Pain Assessment Pain Assessment: No/denies pain  Frequency  Min 2X/week        Progress Toward Goals  OT Goals(current goals can now be found in the care plan section)  Progress towards OT goals: Progressing toward goals      AM-PAC OT 6 Clicks Daily Activity     Outcome Measure   Help from another person eating meals?: A Lot Help from another person taking care of personal grooming?: A Lot Help from another person toileting, which includes using toliet, bedpan, or urinal?: Total Help from another person bathing (including washing, rinsing, drying)?: Total Help from another person to put on and taking off regular upper  body clothing?: Total Help from another person to put on and taking off regular lower body clothing?: Total 6 Click Score: 8    End of Session Equipment Utilized During Treatment: Other (comment) (STEDY)  OT Visit Diagnosis: Unsteadiness on feet (R26.81);Other abnormalities of gait and mobility (R26.89);Muscle weakness (generalized) (M62.81)   Activity Tolerance Patient tolerated treatment well   Patient Left in bed;with call bell/phone within reach;with bed alarm set   Nurse Communication Mobility status        Time: 1550-1611 OT Time Calculation (min): 21 min  Charges: OT General Charges $OT Visit: 1 Visit OT Treatments $Therapeutic Activity: 8-22 mins  Izetta Claude, MS, OTR/L , CBIS ascom [phone]  10/24/23, 4:18 PM

## 2023-10-24 NOTE — Progress Notes (Addendum)
 Nutrition Follow Up Note   DOCUMENTATION CODES:   Obesity unspecified  INTERVENTION:   Discontinue Ensure Plus High Protein   MVI po daily   Vitamin C  500mg  po BID  Daily weights   NUTRITION DIAGNOSIS:   Inadequate oral intake related to acute illness as evidenced by per patient/family report. -ongoing   GOAL:   Patient will meet greater than or equal to 90% of their needs -progressing   MONITOR:   PO intake, Supplement acceptance, Labs, Weight trends, Skin, I & O's  ASSESSMENT:   78 y.o female with h/o Afib on Eliquis , dual-chamber pacemaker for complete heart block, DM2, CKD III, HLD, CAD, HTN, ICH (2012), CVA, GERD, seizures, fibromyalgia, depression/anxiety, gout and OSA not on CPAP who is admitted with altered mental status, UTI, AKI and diarrhea.  Pt with fair appetite and oral intake. Pt documented to be eating anywhere from sips/bites to 100% of meals. Pt ate bites of her breakfast this morning and 100% of her lunch. Pt is refusing all ONS; will discontinue Ensure. Family is asking to bring in foods that patient will eat. Continue MVI and vitamin C  to support wound healing. Per chart, pt is down ~8lbs since admission; RD unsure of pt's UBW. Pt +561ml on her I & Os. CRRT stopped 7/17. Plan is for SNF at discharge.    Medications reviewed and include: pepcid , MVI  Labs reviewed: K 3.7 wnl, BUN 33(H), creat 1.92(H), Mg 1.7 wnl Wbc- 10.6(H), Hgb 10.5(L), Hct 31.7(L)  UOP-   Diet Order:   Diet Order             Diet regular Room service appropriate? Yes; Fluid consistency: Thin  Diet effective now                  EDUCATION NEEDS:   Education needs have been addressed  Skin:  Skin Assessment: Reviewed RN Assessment (Stage II sacrum, ecchymosis)  Last BM:  7/22- type 6  Height:   Ht Readings from Last 1 Encounters:  10/16/23 5' 3 (1.6 m)    Weight:   Wt Readings from Last 1 Encounters:  10/24/23 99.5 kg    Ideal Body Weight:  52.2  kg  BMI:  Body mass index is 38.86 kg/m.  Estimated Nutritional Needs:   Kcal:  2000-2300kcal/day  Protein:  100-115g/day  Fluid:  1.6-1.8L/day  Augustin Shams MS, RD, LDN If unable to be reached, please send secure chat to RD inpatient available from 8:00a-4:00p daily

## 2023-10-24 NOTE — Progress Notes (Signed)
 Physical Therapy Treatment Patient Details Name: Traci Carter MRN: [id number] DOB: [birth year] Today's Date: 10/24/2023   History of Present Illness Traci Carter is a 78yoF who comes to [facility] on 10/16/23 c AMS, diarrhea, blood in stool, emesis x2 days. Frequent falls recently at home. PMH: PAF, PPM s/p HB, HTN, CVA, DM2. Pt admitted with circulatory shock 2/2 UTI and AKI. Pt requires ICU level care and transferred to 1a from ICU on 10/21/23.    PT Comments  Pt received upright in bed. More oriented today than previously documented at eval however is not overly motivated to participate with PT. Required regular multimodal cuing during supine to sit, increased time, and modA at torso to sit EOB. Able to tolerate static sitting with fair tolerance. No interest in LE therex or any washing ADL's, continues to decline further participation due to having a kidney infection. Encouraged to attempt R lateral scoots EoB and able to complete very small and minimal scoots before return to supine and totalA with bed in trendelenburg. Pt with all needs in reach. D/c recs remain appropriate.      If plan is discharge home, recommend the following: Two people to help with walking and/or transfers;Two people to help with bathing/dressing/bathroom   Can travel by private vehicle     No  Equipment Recommendations  None recommended by PT    Recommendations for Other Services       Precautions / Restrictions Precautions Precautions: Fall Recall of Precautions/Restrictions: Impaired Restrictions Weight Bearing Restrictions Per Provider Order: No     Mobility  Bed Mobility Overal bed mobility: Needs Assistance Bed Mobility: Supine to Sit, Sit to Supine     Supine to sit: Mod assist, HOB elevated, Used rails Sit to supine: Mod assist     Patient Response: Flat affect  Transfers                  Lateral/Scoot Transfers: Contact guard assist General transfer comment: x2 very small and  limited scoots towards HOB.    Ambulation/Gait               General Gait Details: pt declining   Stairs             Wheelchair Mobility     Tilt Bed Tilt Bed Patient Response: Flat affect  Modified Rankin (Stroke Patients Only)       Balance Overall balance assessment: Needs assistance Sitting-balance support: Bilateral upper extremity supported, Feet supported Sitting balance-Leahy Scale: Fair                                      Communication    Cognition Arousal: Alert Behavior During Therapy: WFL for tasks assessed/performed, Flat affect   PT - Cognitive impairments: No apparent impairments                       PT - Cognition Comments: appears improved. KNows husband is alive in palliative care. At eval was stating her husband was dead. Knows she is in the hospital and knows she has an infection. Not overly motivated to participate. Following commands: Impaired Following commands impaired: Follows one step commands inconsistently    Cueing Cueing Techniques: Verbal cues, Gestural cues, Tactile cues, Visual cues  Exercises      General Comments        Pertinent Vitals/Pain Pain Assessment Pain Assessment: Faces Faces Pain  Scale: No hurt    Home Living                          Prior Function            PT Goals (current goals can now be found in the care plan section) Acute Rehab PT Goals PT Goal Formulation: Patient unable to participate in goal setting    Frequency    Min 2X/week      PT Plan      Co-evaluation              AM-PAC PT 6 Clicks Mobility   Outcome Measure  Help needed turning from your back to your side while in a flat bed without using bedrails?: Total Help needed moving from lying on your back to sitting on the side of a flat bed without using bedrails?: Total Help needed moving to and from a bed to a chair (including a wheelchair)?: Total Help needed  standing up from a chair using your arms (e.g., wheelchair or bedside chair)?: Total Help needed to walk in hospital room?: Total Help needed climbing 3-5 steps with a railing? : Total 6 Click Score: 6    End of Session   Activity Tolerance: Other (comment) (does not appear overly motivated) Patient left: in bed;with bed alarm set;with call bell/phone within reach Nurse Communication: Mobility status PT Visit Diagnosis: Other abnormalities of gait and mobility (R26.89);Unsteadiness on feet (R26.81);Muscle weakness (generalized) (M62.81);Dizziness and giddiness (R42)     Time: 8876-8864 PT Time Calculation (min) (ACUTE ONLY): 12 min  Charges:    $Therapeutic Activity: 8-22 mins PT General Charges $$ ACUTE PT VISIT: 1 Visit                     Dorina HERO. Fairly IV, PT, DPT Physical Therapist- [facility] 10/24/2023, 2:32 PM

## 2023-10-24 NOTE — Plan of Care (Signed)
   Problem: Education: Goal: Knowledge of General Education information will improve Description: Including pain rating scale, medication(s)/side effects and non-pharmacologic comfort measures Outcome: Progressing   Problem: Clinical Measurements: Goal: Ability to maintain clinical measurements within normal limits will improve Outcome: Progressing Goal: Will remain free from infection Outcome: Progressing

## 2023-10-24 NOTE — Plan of Care (Signed)

## 2023-10-24 NOTE — Progress Notes (Signed)
  PROGRESS NOTE    Traci Carter  FMW:[id number] DOB: 1945-12-25 DOA: 10/16/2023 PCP: Traci Honor [name], MD  [id number]  LOS: 8 days   Brief hospital course:   Assessment & Plan: 78 y.o female with significant PMHx of Atrial Fibrillation on Eliquis , Dual-chamber pacemaker for complete heart block 12/04/2003, DM2, CKD3, CAD, HTN, ICH (2012), CVA, Seizures, Fibromyalgia, Depression/anxiety, OSA not on CPAP, E. Coli and Enterococcus faecalis UTI who presented to the ED with chief complaints of altered mental status.   Admitted to ICU for pressor and CRRT.  Transferred to [facility] on 10/21/23.   #Septic shock - PRESENT ON ADMISSION - due to UTI and probable intrabdominal source (nv and diarrhea) Hx of E. Coli and Enterococcus faecalis UTI  Presenting with altered mental status, UA positive for UTI --completed 5 days of ceftriaxone  and flagyl   #AKI on CKD stage III Patient had oliguric renal failure. She was started on CRRT. She is presently off of CRRT. Creatinine levels are stabilizing. Urine output is good.  --management per nephro  Acute metabolic acidosis, resolved Hyperkalemia, resolved  Hypokalemia --monitor and supplement per nephro  Hypomag --monitor and supplement PRN   #Paroxysmal Afib  Dual-chamber pacemaker for complete heart block --cont Eliquis   #HTN:  --BP intermittently low --hold home Lisinopril   #Hx of seizures -Continued home keppra    #Anxiety and Depression -No home meds   #T2DM --A1c 5.7. --d/c'ed BG checks and SSI  Likely dementia --pt alert, but not oriented.  Confused.  #FTT #Falls Risk She uses a walker at baseline and will use a wheelchair which she goes to the doctor's office. Recent multiple falls with decreased po intake --PT/OT   #OSA not on CPAP Remote hx of Asthma/COPD   DVT prophylaxis: On:Eliquis  Code Status: Full code  Family Communication:  Level of care: Med-Surg Dispo:   The patient is from: home Anticipated d/c is  to: SNF rehab Anticipated d/c date is: whenever SNF ready to accept   Subjective and Interval History:  Pt was pleasantly confused.  No complaint.     Objective: Vitals:   10/24/23 0350 10/24/23 0912 10/24/23 1655 10/24/23 1936  BP: (!) 144/37 (!) 157/71 (!) 152/72 126/84  Pulse: 78 91 70 86  Resp: 18 16 16 15   Temp: 97.8 F (36.6 C) 98 F (36.7 C) 97.7 F (36.5 C) 98 F (36.7 C)  TempSrc:  Oral Oral Oral  SpO2: 98% 98% 100% 99%  Weight:      Height:        Intake/Output Summary (Last 24 hours) at 10/24/2023 2108 Last data filed at 10/24/2023 1438 Gross per 24 hour  Intake 240 ml  Output 1200 ml  Net -960 ml   Filed Weights   10/22/23 0528 10/23/23 0520 10/24/23 0329  Weight: 99.8 kg 97.6 kg 99.5 kg    Examination:   Constitutional: NAD, alert, oriented to self only HEENT: conjunctivae and lids normal, EOMI CV: No cyanosis.   RESP: normal respiratory effort, on RA   Data Reviewed: I have personally reviewed labs and imaging studies  Time spent: 25 minutes  Ellouise Haber, MD [facility] If 7PM-7AM, please contact night-coverage 10/24/2023, 9:08 PM

## 2023-10-25 DIAGNOSIS — R410 Disorientation, unspecified: Secondary | ICD-10-CM | POA: Diagnosis not present

## 2023-10-25 LAB — BASIC METABOLIC PANEL WITH GFR
Anion gap: 9 (ref 5–15)
Anion gap: 14 (ref 5–15)
BUN: 33 mg/dL — ABNORMAL HIGH (ref 8–23)
BUN: 30 mg/dL — ABNORMAL HIGH (ref 8–23)
CO2: 27 mmol/L (ref 22–32)
CO2: 24 mmol/L (ref 22–32)
Calcium: 8.8 mg/dL — ABNORMAL LOW (ref 8.9–10.3)
Calcium: 9.3 mg/dL (ref 8.9–10.3)
Chloride: 104 mmol/L (ref 98–111)
Chloride: 101 mmol/L (ref 98–111)
Creatinine, Ser: 1.55 mg/dL — ABNORMAL HIGH (ref 0.44–1.00)
Creatinine, Ser: 1.56 mg/dL — ABNORMAL HIGH (ref 0.44–1.00)
GFR, Estimated: 35 mL/min — ABNORMAL LOW (ref >60)
GFR, Estimated: 34 mL/min — ABNORMAL LOW (ref >60)
Glucose, Bld: 301 mg/dL — ABNORMAL HIGH (ref 70–99)
Glucose, Bld: 309 mg/dL — ABNORMAL HIGH (ref 70–99)
Potassium: 3.7 mmol/L (ref 3.5–5.1)
Potassium: 3.8 mmol/L (ref 3.5–5.1)
Sodium: 140 mmol/L (ref 135–145)
Sodium: 139 mmol/L (ref 135–145)

## 2023-10-25 LAB — GLUCOSE, CAPILLARY
Glucose-Capillary: 401 mg/dL — ABNORMAL HIGH (ref 70–99)
Glucose-Capillary: 184 mg/dL — ABNORMAL HIGH (ref 70–99)

## 2023-10-25 LAB — MAGNESIUM: Magnesium: 1.6 mg/dL — ABNORMAL LOW (ref 1.7–2.4)

## 2023-10-25 MED ORDER — INSULIN ASPART 100 UNIT/ML IJ SOLN
0.0000 [IU] | Freq: Every day | INTRAMUSCULAR | Status: DC
  Administered 2023-10-26: 3 [IU] via SUBCUTANEOUS
  Administered 2023-10-27 – 2023-10-28 (×2): 2 [IU] via SUBCUTANEOUS
  Filled 2023-10-25 (×4): qty 1

## 2023-10-25 MED ORDER — INSULIN ASPART 100 UNIT/ML IJ SOLN
0.0000 [IU] | Freq: Three times a day (TID) | INTRAMUSCULAR | Status: DC
  Administered 2023-10-25: 9 [IU] via SUBCUTANEOUS
  Administered 2023-10-26 (×2): 3 [IU] via SUBCUTANEOUS
  Administered 2023-10-26: 5 [IU] via SUBCUTANEOUS
  Administered 2023-10-27: 2 [IU] via SUBCUTANEOUS
  Administered 2023-10-27: 7 [IU] via SUBCUTANEOUS
  Administered 2023-10-27: 2 [IU] via SUBCUTANEOUS
  Administered 2023-10-28: 3 [IU] via SUBCUTANEOUS
  Administered 2023-10-28: 2 [IU] via SUBCUTANEOUS
  Administered 2023-10-28 – 2023-10-29 (×3): 3 [IU] via SUBCUTANEOUS
  Administered 2023-10-29 – 2023-10-30 (×2): 2 [IU] via SUBCUTANEOUS
  Filled 2023-10-25 (×14): qty 1

## 2023-10-25 NOTE — Progress Notes (Signed)
 PROGRESS NOTE    Traci Carter  FMW:[id number] DOB: 12/27/1945 DOA: 10/16/2023 PCP: Corlis Honor [name], MD   Assessment & Plan:   Principal Problem:   Altered mental status Active Problems:   Acute renal failure with tubular necrosis (HCC)   Hypovolemic shock (HCC)   Severe sepsis with septic shock (HCC)   Metabolic acidosis  Assessment and Plan: Septic shock : present on admission likely secondary to UTI and probable intrabdominal source,  diarrhea. Completed abx course.    AKI: as per nephro. Did have CRRT for a short period. No indication for HD currently as per nephro. Nephro following and recs apprec   Acute metabolic acidosis: resolved  Hyperkalemia: resolved   Hypokalemia: WNL today    Hypomagnesemia: mg sulfate given    PAF: continue on eliquis   Hx of complete heart block: s/p dual chamber pacemaker  HTN: will restart home dose of lisinopril  in AM   Hx of seizures: continue on home dose of keppra   DM2: well controlled, HbA1c 5.7. Continue on SSI w/ accuchecks   Likely dementia: continue w/ supportive care   Generalized weakness: PT/OT recs SNF. Recent multiple falls  OSA: not on CPAP  Hx of Asthma/COPD: unknown stage and/or severity. Bronchodilators prn  Obesity: BMI 37.0. Would benefit from weight loss    DVT prophylaxis: eliquis  Code Status: Full Family Communication:  Disposition Plan: needs SNF placement  Status is: Inpatient Remains inpatient appropriate because: needs SNF placement     Level of care: Med-Surg Consultants:  Nephro   Procedures:   Antimicrobials:    Subjective: Pt c/o fatigue   Objective: Vitals:   10/24/23 1936 10/25/23 0314 10/25/23 0316 10/25/23 0811  BP: 126/84 (!) 151/81  132/67  Pulse: 86 61  66  Resp: 15 16  18   Temp: 98 F (36.7 C) 97.9 F (36.6 C)    TempSrc: Oral Oral    SpO2: 99% 98%  99%  Weight:   94.9 kg   Height:        Intake/Output Summary (Last 24 hours) at 10/25/2023 1114 Last data  filed at 10/24/2023 2235 Gross per 24 hour  Intake 240 ml  Output 1000 ml  Net -760 ml   Filed Weights   10/23/23 0520 10/24/23 0329 10/25/23 0316  Weight: 97.6 kg 99.5 kg 94.9 kg    Examination:  General exam: Appears calm and comfortable  Respiratory system: Clear to auscultation. Respiratory effort normal. Cardiovascular system: S1 & S2 +. No  rubs, gallops or clicks. Gastrointestinal system: Abdomen is obese soft and nontender. Hypoactive bowel sounds heard. Central nervous system: Alert and awake. Moves all extremities Psychiatry: Judgement and insight appears poor. Flat mood and affect     Data Reviewed: I have personally reviewed following labs and imaging studies  CBC: Recent Labs  Lab 10/19/23 0405 10/20/23 0431 10/21/23 0418 10/22/23 0515 10/23/23 0637  WBC 7.0 8.0 10.1 10.4 10.6*  HGB 8.7* 9.1* 9.6* 9.7* 10.5*  HCT 25.3* 26.6* 28.8* 28.8* 31.7*  MCV 93.0 94.0 95.0 92.9 94.3  PLT 118* 112* 113* 121* 139*   Basic Metabolic Panel: Recent Labs  Lab 10/18/23 1532 10/18/23 1532 10/19/23 0405 10/19/23 1615 10/20/23 0431 10/21/23 0418 10/22/23 0515 10/23/23 0637 10/24/23 0458 10/25/23 0424  NA 136  --  138 135 137 141 141 141 141 140  K 3.8  --  3.5 3.4* 3.3* 3.6 3.3* 3.5 3.7 3.7  CL 102  --  105 102 102 105 104 101 103 104  CO2 20*  --  24 24 25 26 27 26 26 27   GLUCOSE 163*  --  118* 259* 162* 128* 232* 224* 295* 301*  BUN 30*  --  20 17 22  32* 37* 35* 33* 33*  CREATININE 2.20*  --  1.45* 1.08* 1.67* 2.54* 2.44* 2.27* 1.92* 1.55*  CALCIUM  7.3*  --  7.5* 8.0* 8.5* 8.7* 8.6* 9.1 8.8* 8.8*  MG  --    < > 2.3  --  2.5* 2.1 1.9 1.6* 1.7 1.6*  PHOS 3.0  --  2.2* 1.9* 4.0 4.6  --   --   --   --    < > = values in this interval not displayed.   GFR: Estimated Creatinine Clearance: 33.8 mL/min (A) (by C-G formula based on SCr of 1.55 mg/dL (H)). Liver Function Tests: Recent Labs  Lab 10/19/23 0405 10/19/23 1615 10/20/23 0431 10/21/23 0418  10/23/23 0637  AST  --   --   --   --  14*  ALT  --   --   --   --  13  ALKPHOS  --   --   --   --  74  BILITOT  --   --   --   --  0.9  PROT  --   --   --   --  6.3*  ALBUMIN  2.6* 3.3* 3.2* 3.1* 3.3*   No results for input(s): LIPASE, AMYLASE in the last 168 hours. No results for input(s): AMMONIA in the last 168 hours. Coagulation Profile: No results for input(s): INR, PROTIME in the last 168 hours. Cardiac Enzymes: No results for input(s): CKTOTAL, CKMB, CKMBINDEX, TROPONINI in the last 168 hours. BNP (last 3 results) No results for input(s): PROBNP in the last 8760 hours. HbA1C: No results for input(s): HGBA1C in the last 72 hours. CBG: Recent Labs  Lab 10/20/23 1552 10/20/23 1916 10/20/23 2311 10/21/23 0311 10/21/23 0710  GLUCAP 126* 109* 155* 121* 102*   Lipid Profile: No results for input(s): CHOL, HDL, LDLCALC, TRIG, CHOLHDL, LDLDIRECT in the last 72 hours. Thyroid Function Tests: No results for input(s): TSH, T4TOTAL, FREET4, T3FREE, THYROIDAB in the last 72 hours. Anemia Panel: No results for input(s): VITAMINB12, FOLATE, FERRITIN, TIBC, IRON, RETICCTPCT in the last 72 hours. Sepsis Labs: No results for input(s): PROCALCITON, LATICACIDVEN in the last 168 hours.  Recent Results (from the past 240 hours)  Urine Culture (for pregnant, neutropenic or urologic patients or patients with an indwelling urinary catheter)     Status: Abnormal   Collection Time: 10/16/23 10:04 PM   Specimen: Urine, Random  Result Value Ref Range Status   Specimen Description   Final    URINE, RANDOM Performed at [facility], [address], [place], [place] [address]    Special Requests   Final    NONE Performed at [facility], [address] [place], [place] [address]    Culture 80,000 COLONIES/mL ESCHERICHIA COLI (A)  Final   Report Status 10/19/2023 FINAL  Final   Organism ID, Bacteria ESCHERICHIA  COLI (A)  Final      Susceptibility   Escherichia coli - MIC*    AMPICILLIN >=32 RESISTANT Resistant     CEFAZOLIN  <=4 SENSITIVE Sensitive     CEFEPIME <=0.12 SENSITIVE Sensitive     CEFTRIAXONE  <=0.25 SENSITIVE Sensitive     CIPROFLOXACIN <=0.25 SENSITIVE Sensitive     GENTAMICIN 8 INTERMEDIATE Intermediate     IMIPENEM <=0.25 SENSITIVE Sensitive  NITROFURANTOIN <=16 SENSITIVE Sensitive     TRIMETH/SULFA >=320 RESISTANT Resistant     AMPICILLIN/SULBACTAM 4 SENSITIVE Sensitive     PIP/TAZO <=4 SENSITIVE Sensitive ug/mL    * 80,000 COLONIES/mL ESCHERICHIA COLI  MRSA Next Gen by PCR, Nasal     Status: None   Collection Time: 10/16/23 11:05 PM   Specimen: Nasal Mucosa; Nasal Swab  Result Value Ref Range Status   MRSA by PCR Next Gen NOT DETECTED NOT DETECTED Final    Comment: (NOTE) The GeneXpert MRSA Assay (FDA approved for NASAL specimens only), is one component of a comprehensive MRSA colonization surveillance program. It is not intended to diagnose MRSA infection nor to guide or monitor treatment for MRSA infections. Test performance is not FDA approved in patients less than 12 years old. Performed at [facility], [address] [place], [place] [address]   Resp panel by RT-PCR (RSV, Flu A&B, Covid) Anterior Nasal Swab     Status: None   Collection Time: 10/17/23 10:03 AM   Specimen: Anterior Nasal Swab  Result Value Ref Range Status   SARS Coronavirus 2 by RT PCR NEGATIVE NEGATIVE Final    Comment: (NOTE) SARS-CoV-2 target nucleic acids are NOT DETECTED.  The SARS-CoV-2 RNA is generally detectable in upper respiratory specimens during the acute phase of infection. The lowest concentration of SARS-CoV-2 viral copies this assay can detect is 138 copies/mL. A negative result does not preclude SARS-Cov-2 infection and should not be used as the sole basis for treatment or other patient management decisions. A negative result may occur with  improper specimen  collection/handling, submission of specimen other than nasopharyngeal swab, presence of viral mutation(s) within the areas targeted by this assay, and inadequate number of viral copies(<138 copies/mL). A negative result must be combined with clinical observations, patient history, and epidemiological information. The expected result is Negative.  Fact Sheet for Patients:  BloggerCourse.com  Fact Sheet for Healthcare Providers:  SeriousBroker.it  This test is no t yet approved or cleared by the [place]  FDA and  has been authorized for detection and/or diagnosis of SARS-CoV-2 by FDA under an Emergency Use Authorization (EUA). This EUA will remain  in effect (meaning this test can be used) for the duration of the COVID-19 declaration under Section 564(b)(1) of the Act, 21 U.S.C.section 360bbb-3(b)(1), unless the authorization is terminated  or revoked sooner.       Influenza A by PCR NEGATIVE NEGATIVE Final   Influenza B by PCR NEGATIVE NEGATIVE Final    Comment: (NOTE) The Xpert Xpress SARS-CoV-2/FLU/RSV plus assay is intended as an aid in the diagnosis of influenza from Nasopharyngeal swab specimens and should not be used as a sole basis for treatment. Nasal washings and aspirates are unacceptable for Xpert Xpress SARS-CoV-2/FLU/RSV testing.  Fact Sheet for Patients: BloggerCourse.com  Fact Sheet for Healthcare Providers: SeriousBroker.it  This test is not yet approved or cleared by the [place]  FDA and has been authorized for detection and/or diagnosis of SARS-CoV-2 by FDA under an Emergency Use Authorization (EUA). This EUA will remain in effect (meaning this test can be used) for the duration of the COVID-19 declaration under Section 564(b)(1) of the Act, 21 U.S.C. section 360bbb-3(b)(1), unless the authorization is terminated or revoked.     Resp Syncytial  Virus by PCR NEGATIVE NEGATIVE Final    Comment: (NOTE) Fact Sheet for Patients: BloggerCourse.com  Fact Sheet for Healthcare Providers: SeriousBroker.it  This test is not yet approved or cleared by the [place]   FDA and has been authorized for detection and/or diagnosis of SARS-CoV-2 by FDA under an Emergency Use Authorization (EUA). This EUA will remain in effect (meaning this test can be used) for the duration of the COVID-19 declaration under Section 564(b)(1) of the Act, 21 U.S.C. section 360bbb-3(b)(1), unless the authorization is terminated or revoked.  Performed at [facility], [address] [place], [place] [address]   Respiratory (~20 pathogens) panel by PCR     Status: None   Collection Time: 10/17/23 10:03 AM   Specimen: Nasopharyngeal Swab; Respiratory  Result Value Ref Range Status   Adenovirus NOT DETECTED NOT DETECTED Final   Coronavirus 229E NOT DETECTED NOT DETECTED Final    Comment: (NOTE) The Coronavirus on the Respiratory Panel, DOES NOT test for the novel  Coronavirus (2019 nCoV)    Coronavirus HKU1 NOT DETECTED NOT DETECTED Final   Coronavirus NL63 NOT DETECTED NOT DETECTED Final   Coronavirus OC43 NOT DETECTED NOT DETECTED Final   Metapneumovirus NOT DETECTED NOT DETECTED Final   Rhinovirus / Enterovirus NOT DETECTED NOT DETECTED Final   Influenza A NOT DETECTED NOT DETECTED Final   Influenza B NOT DETECTED NOT DETECTED Final   Parainfluenza Virus 1 NOT DETECTED NOT DETECTED Final   Parainfluenza Virus 2 NOT DETECTED NOT DETECTED Final   Parainfluenza Virus 3 NOT DETECTED NOT DETECTED Final   Parainfluenza Virus 4 NOT DETECTED NOT DETECTED Final   Respiratory Syncytial Virus NOT DETECTED NOT DETECTED Final   Bordetella pertussis NOT DETECTED NOT DETECTED Final   Bordetella Parapertussis NOT DETECTED NOT DETECTED Final   Chlamydophila pneumoniae NOT DETECTED NOT DETECTED Final    Mycoplasma pneumoniae NOT DETECTED NOT DETECTED Final    Comment: Performed at [facility], [address] [place], [place] [address]  Culture, blood (Routine X 2) w Reflex to ID Panel     Status: None   Collection Time: 10/17/23 11:05 AM   Specimen: BLOOD  Result Value Ref Range Status   Specimen Description BLOOD BLOOD RIGHT HAND  Final   Special Requests   Final    BOTTLES DRAWN AEROBIC ONLY Blood Culture results may not be optimal due to an inadequate volume of blood received in culture bottles   Culture   Final    NO GROWTH 5 DAYS Performed at [facility], [address] [place], [place] [address]    Report Status 10/22/2023 FINAL  Final         Radiology Studies: No results found.      Scheduled Meds:  apixaban   5 mg Oral BID   vitamin C   500 mg Oral BID   atorvastatin   20 mg Oral Daily   Chlorhexidine  Gluconate Cloth  6 each Topical Daily   famotidine   20 mg Oral Daily   levETIRAcetam   500 mg Oral BID   multivitamin with minerals  1 tablet Oral Daily   traZODone   50 mg Oral QHS   Continuous Infusions:  anticoagulant sodium citrate        LOS: 9 days      [name], MD [facility] Pager [phone]  If 7PM-7AM, please contact night-coverage [email or web address]  10/25/2023, 11:14 AM

## 2023-10-25 NOTE — Inpatient Diabetes Management (Signed)
 Inpatient Diabetes Program Recommendations  AACE/ADA: New Consensus Statement on Inpatient Glycemic Control (2015)  Target Ranges:  Prepandial:   less than 140 mg/dL      Peak postprandial:   less than 180 mg/dL (1-2 hours)      Critically ill patients:  140 - 180 mg/dL   Lab Results  Component Value Date   GLUCAP 102 (H) 10/21/2023   HGBA1C 5.7 (H) 10/17/2023    Review of Glycemic Control  Latest Reference Range & Units 10/22/23 05:15 10/23/23 06:37 10/24/23 04:58 10/25/23 04:24  Glucose 70 - 99 mg/dL 767 (H) 775 (H) 704 (H) 301 (H)   Diabetes history: DM 2 Outpatient Diabetes medications:  Glucotrol  2.5 mg XL, Metformin  1000 mg bid Current orders for Inpatient glycemic control:  None Inpatient Diabetes Program Recommendations:   Note history of DM.  Lab glucose elevated 301 mg/dL.  Consider adding Novolog  0-9 units tid with meals and HS.   Thanks,  Randall Bullocks, RN, BC-ADM Inpatient Diabetes Coordinator Pager [phone]  (8a-5p)

## 2023-10-25 NOTE — Progress Notes (Signed)
 [facility]  ROUNDING NOTE   Subjective:   Patient sitting up in bed Alert Continues to have some weakness  Creatinine 1.55 UOP 1L in previous 24 hours  Objective:  Vital signs in last 24 hours:  Temp:  [97.7 F (36.5 C)-98 F (36.7 C)] 97.9 F (36.6 C) (07/23 0314) Pulse Rate:  [61-86] 66 (07/23 0811) Resp:  [15-18] 18 (07/23 0811) BP: (126-152)/(67-84) 132/67 (07/23 0811) SpO2:  [98 %-100 %] 99 % (07/23 0811) Weight:  [94.9 kg] 94.9 kg (07/23 0316)  Weight change: -4.6 kg Filed Weights   10/23/23 0520 10/24/23 0329 10/25/23 0316  Weight: 97.6 kg 99.5 kg 94.9 kg    Intake/Output: I/O last 3 completed shifts: In: 240 [P.O.:240] Out: 1700 [Urine:1700]   Intake/Output this shift:  No intake/output data recorded.  Physical Exam: General: NAD  Head: Normocephalic, atraumatic. Moist oral mucosal membranes  Eyes: Anicteric  Neck: Supple  Lungs:  Clear to auscultation, normal effort  Heart: Regular rate and rhythm  Abdomen:  Soft, nontender  Extremities:  No peripheral edema.  Neurologic: Awake, alert, conversant  Skin: Warm,dry, no rash  Access: Removed    Basic Metabolic Panel: Recent Labs  Lab 10/18/23 1532 10/18/23 1532 10/19/23 0405 10/19/23 1615 10/20/23 0431 10/21/23 0418 10/22/23 0515 10/23/23 0637 10/24/23 0458 10/25/23 0424  NA 136  --  138 135 137 141 141 141 141 140  K 3.8  --  3.5 3.4* 3.3* 3.6 3.3* 3.5 3.7 3.7  CL 102  --  105 102 102 105 104 101 103 104  CO2 20*  --  24 24 25 26 27 26 26 27   GLUCOSE 163*  --  118* 259* 162* 128* 232* 224* 295* 301*  BUN 30*  --  20 17 22  32* 37* 35* 33* 33*  CREATININE 2.20*  --  1.45* 1.08* 1.67* 2.54* 2.44* 2.27* 1.92* 1.55*  CALCIUM  7.3*  --  7.5* 8.0* 8.5* 8.7* 8.6* 9.1 8.8* 8.8*  MG  --    < > 2.3  --  2.5* 2.1 1.9 1.6* 1.7 1.6*  PHOS 3.0  --  2.2* 1.9* 4.0 4.6  --   --   --   --    < > = values in this interval not displayed.    Liver Function Tests: Recent Labs  Lab  10/19/23 0405 10/19/23 1615 10/20/23 0431 10/21/23 0418 10/23/23 0637  AST  --   --   --   --  14*  ALT  --   --   --   --  13  ALKPHOS  --   --   --   --  74  BILITOT  --   --   --   --  0.9  PROT  --   --   --   --  6.3*  ALBUMIN  2.6* 3.3* 3.2* 3.1* 3.3*   No results for input(s): LIPASE, AMYLASE in the last 168 hours. No results for input(s): AMMONIA in the last 168 hours.  CBC: Recent Labs  Lab 10/19/23 0405 10/20/23 0431 10/21/23 0418 10/22/23 0515 10/23/23 0637  WBC 7.0 8.0 10.1 10.4 10.6*  HGB 8.7* 9.1* 9.6* 9.7* 10.5*  HCT 25.3* 26.6* 28.8* 28.8* 31.7*  MCV 93.0 94.0 95.0 92.9 94.3  PLT 118* 112* 113* 121* 139*    Cardiac Enzymes: No results for input(s): CKTOTAL, CKMB, CKMBINDEX, TROPONINI in the last 168 hours.   BNP: Invalid input(s): POCBNP  CBG: Recent Labs  Lab 10/20/23 1552 10/20/23  1916 10/20/23 2311 10/21/23 0311 10/21/23 0710  GLUCAP 126* 109* 155* 121* 102*    Microbiology: Results for orders placed or performed during the hospital encounter of 10/16/23  Urine Culture (for pregnant, neutropenic or urologic patients or patients with an indwelling urinary catheter)     Status: Abnormal   Collection Time: 10/16/23 10:04 PM   Specimen: Urine, Random  Result Value Ref Range Status   Specimen Description   Final    URINE, RANDOM Performed at [facility], [address] [place], [place] [address]    Special Requests   Final    NONE Performed at [facility], [address] [place], [place] [address]    Culture 80,000 COLONIES/mL ESCHERICHIA COLI (A)  Final   Report Status 10/19/2023 FINAL  Final   Organism ID, Bacteria ESCHERICHIA COLI (A)  Final      Susceptibility   Escherichia coli - MIC*    AMPICILLIN >=32 RESISTANT Resistant     CEFAZOLIN  <=4 SENSITIVE Sensitive     CEFEPIME <=0.12 SENSITIVE Sensitive     CEFTRIAXONE  <=0.25 SENSITIVE Sensitive     CIPROFLOXACIN <=0.25 SENSITIVE Sensitive      GENTAMICIN 8 INTERMEDIATE Intermediate     IMIPENEM <=0.25 SENSITIVE Sensitive     NITROFURANTOIN <=16 SENSITIVE Sensitive     TRIMETH/SULFA >=320 RESISTANT Resistant     AMPICILLIN/SULBACTAM 4 SENSITIVE Sensitive     PIP/TAZO <=4 SENSITIVE Sensitive ug/mL    * 80,000 COLONIES/mL ESCHERICHIA COLI  MRSA Next Gen by PCR, Nasal     Status: None   Collection Time: 10/16/23 11:05 PM   Specimen: Nasal Mucosa; Nasal Swab  Result Value Ref Range Status   MRSA by PCR Next Gen NOT DETECTED NOT DETECTED Final    Comment: (NOTE) The GeneXpert MRSA Assay (FDA approved for NASAL specimens only), is one component of a comprehensive MRSA colonization surveillance program. It is not intended to diagnose MRSA infection nor to guide or monitor treatment for MRSA infections. Test performance is not FDA approved in patients less than 59 years old. Performed at [facility], [address] [place], [place] [address]   Resp panel by RT-PCR (RSV, Flu A&B, Covid) Anterior Nasal Swab     Status: None   Collection Time: 10/17/23 10:03 AM   Specimen: Anterior Nasal Swab  Result Value Ref Range Status   SARS Coronavirus 2 by RT PCR NEGATIVE NEGATIVE Final    Comment: (NOTE) SARS-CoV-2 target nucleic acids are NOT DETECTED.  The SARS-CoV-2 RNA is generally detectable in upper respiratory specimens during the acute phase of infection. The lowest concentration of SARS-CoV-2 viral copies this assay can detect is 138 copies/mL. A negative result does not preclude SARS-Cov-2 infection and should not be used as the sole basis for treatment or other patient management decisions. A negative result may occur with  improper specimen collection/handling, submission of specimen other than nasopharyngeal swab, presence of viral mutation(s) within the areas targeted by this assay, and inadequate number of viral copies(<138 copies/mL). A negative result must be combined with clinical observations, patient  history, and epidemiological information. The expected result is Negative.  Fact Sheet for Patients:  BloggerCourse.com  Fact Sheet for Healthcare Providers:  SeriousBroker.it  This test is no t yet approved or cleared by the [place]  FDA and  has been authorized for detection and/or diagnosis of SARS-CoV-2 by FDA under an Emergency Use Authorization (EUA). This EUA will remain  in effect (meaning this test can be  used) for the duration of the COVID-19 declaration under Section 564(b)(1) of the Act, 21 U.S.C.section 360bbb-3(b)(1), unless the authorization is terminated  or revoked sooner.       Influenza A by PCR NEGATIVE NEGATIVE Final   Influenza B by PCR NEGATIVE NEGATIVE Final    Comment: (NOTE) The Xpert Xpress SARS-CoV-2/FLU/RSV plus assay is intended as an aid in the diagnosis of influenza from Nasopharyngeal swab specimens and should not be used as a sole basis for treatment. Nasal washings and aspirates are unacceptable for Xpert Xpress SARS-CoV-2/FLU/RSV testing.  Fact Sheet for Patients: BloggerCourse.com  Fact Sheet for Healthcare Providers: SeriousBroker.it  This test is not yet approved or cleared by the [place]  FDA and has been authorized for detection and/or diagnosis of SARS-CoV-2 by FDA under an Emergency Use Authorization (EUA). This EUA will remain in effect (meaning this test can be used) for the duration of the COVID-19 declaration under Section 564(b)(1) of the Act, 21 U.S.C. section 360bbb-3(b)(1), unless the authorization is terminated or revoked.     Resp Syncytial Virus by PCR NEGATIVE NEGATIVE Final    Comment: (NOTE) Fact Sheet for Patients: BloggerCourse.com  Fact Sheet for Healthcare Providers: SeriousBroker.it  This test is not yet approved or cleared by the [place]  FDA  and has been authorized for detection and/or diagnosis of SARS-CoV-2 by FDA under an Emergency Use Authorization (EUA). This EUA will remain in effect (meaning this test can be used) for the duration of the COVID-19 declaration under Section 564(b)(1) of the Act, 21 U.S.C. section 360bbb-3(b)(1), unless the authorization is terminated or revoked.  Performed at [facility], [address] [place], [place] [address]   Respiratory (~20 pathogens) panel by PCR     Status: None   Collection Time: 10/17/23 10:03 AM   Specimen: Nasopharyngeal Swab; Respiratory  Result Value Ref Range Status   Adenovirus NOT DETECTED NOT DETECTED Final   Coronavirus 229E NOT DETECTED NOT DETECTED Final    Comment: (NOTE) The Coronavirus on the Respiratory Panel, DOES NOT test for the novel  Coronavirus (2019 nCoV)    Coronavirus HKU1 NOT DETECTED NOT DETECTED Final   Coronavirus NL63 NOT DETECTED NOT DETECTED Final   Coronavirus OC43 NOT DETECTED NOT DETECTED Final   Metapneumovirus NOT DETECTED NOT DETECTED Final   Rhinovirus / Enterovirus NOT DETECTED NOT DETECTED Final   Influenza A NOT DETECTED NOT DETECTED Final   Influenza B NOT DETECTED NOT DETECTED Final   Parainfluenza Virus 1 NOT DETECTED NOT DETECTED Final   Parainfluenza Virus 2 NOT DETECTED NOT DETECTED Final   Parainfluenza Virus 3 NOT DETECTED NOT DETECTED Final   Parainfluenza Virus 4 NOT DETECTED NOT DETECTED Final   Respiratory Syncytial Virus NOT DETECTED NOT DETECTED Final   Bordetella pertussis NOT DETECTED NOT DETECTED Final   Bordetella Parapertussis NOT DETECTED NOT DETECTED Final   Chlamydophila pneumoniae NOT DETECTED NOT DETECTED Final   Mycoplasma pneumoniae NOT DETECTED NOT DETECTED Final    Comment: Performed at [facility], [address], [place], [place] [address]  Culture, blood (Routine X 2) w Reflex to ID Panel     Status: None   Collection Time: 10/17/23 11:05 AM   Specimen: BLOOD  Result Value  Ref Range Status   Specimen Description BLOOD BLOOD RIGHT HAND  Final   Special Requests   Final    BOTTLES DRAWN AEROBIC ONLY Blood Culture results may not be optimal due to an inadequate volume of blood received in  culture bottles   Culture   Final    NO GROWTH 5 DAYS Performed at [facility], [address] [place], [place] [address]    Report Status 10/22/2023 FINAL  Final    Coagulation Studies: No results for input(s): LABPROT, INR in the last 72 hours.   Urinalysis: No results for input(s): COLORURINE, LABSPEC, PHURINE, GLUCOSEU, HGBUR, BILIRUBINUR, KETONESUR, PROTEINUR, UROBILINOGEN, NITRITE, LEUKOCYTESUR in the last 72 hours.  Invalid input(s): APPERANCEUR     Imaging: No results found.    Medications:    anticoagulant sodium citrate       apixaban   5 mg Oral BID   vitamin C   500 mg Oral BID   atorvastatin   20 mg Oral Daily   Chlorhexidine  Gluconate Cloth  6 each Topical Daily   famotidine   20 mg Oral Daily   levETIRAcetam   500 mg Oral BID   multivitamin with minerals  1 tablet Oral Daily   traZODone   50 mg Oral QHS   acetaminophen , anticoagulant sodium citrate , loperamide   Assessment/ Plan:  Ms. Floyd Lusignan is a 78 y.o.  female with past medical conditions including GERD, CVA, hypertension, diabetes, hyperlipidemia, CAD, gout, obstructive sleep apnea, UTI, chronic kidney disease stage III, who was admitted to [facility] on 10/16/2023 for Altered mental status [R41.82] Altered mental status, unspecified altered mental status type [R41.82]   Acute kidney injury likely secondary to septic UTI. Baseline unknown. UA positive for leukocytes and bacteria. No recent IV contrast exposure. Creatinine on ED arrival 8.32.  Patient did receive CRRT for short period.  Renal recovery in progress. Adequate urine output recorded. Will need follow-up in our office at discharge.  Lab Results  Component Value Date   CREATININE 1.55 (H)  10/25/2023   CREATININE 1.92 (H) 10/24/2023   CREATININE 2.27 (H) 10/23/2023    Intake/Output Summary (Last 24 hours) at 10/25/2023 1253 Last data filed at 10/24/2023 2235 Gross per 24 hour  Intake 240 ml  Output 1000 ml  Net -760 ml   2.  Hypokalemia/hypermagnesium.  Potassium 3.7.  Magnesium  1.6.  Corrections per primary team..   3.  Acute metabolic acidosis.  Serum bicarb 10 on ED arrival.  Corrected with dialysis and IV supplementation.       4.  Sepsis likely secondary to UTI with suspected abdominal involvement based on nausea, vomiting, and diarrhea.  UA positive for UTI, urine culture positive for E. coli.  Patient does have history of E. coli and Enterococcus facilities UTI.  All antibiotics completed.    LOS: 9 Traci Carter 7/23/202512:53 PM

## 2023-10-25 NOTE — Plan of Care (Signed)
   Problem: Health Behavior/Discharge Planning: Goal: Ability to manage health-related needs will improve Outcome: Progressing

## 2023-10-26 DIAGNOSIS — R4182 Altered mental status, unspecified: Secondary | ICD-10-CM | POA: Diagnosis not present

## 2023-10-26 LAB — BASIC METABOLIC PANEL WITH GFR
Anion gap: 9 (ref 5–15)
BUN: 27 mg/dL — ABNORMAL HIGH (ref 8–23)
CO2: 28 mmol/L (ref 22–32)
Calcium: 8.9 mg/dL (ref 8.9–10.3)
Chloride: 105 mmol/L (ref 98–111)
Creatinine, Ser: 1.51 mg/dL — ABNORMAL HIGH (ref 0.44–1.00)
GFR, Estimated: 36 mL/min — ABNORMAL LOW (ref >60)
Glucose, Bld: 205 mg/dL — ABNORMAL HIGH (ref 70–99)
Potassium: 3.7 mmol/L (ref 3.5–5.1)
Sodium: 142 mmol/L (ref 135–145)

## 2023-10-26 LAB — CBC
HCT: 27.6 % — ABNORMAL LOW (ref 36.0–46.0)
Hemoglobin: 9.4 g/dL — ABNORMAL LOW (ref 12.0–15.0)
MCH: 32.3 pg (ref 26.0–34.0)
MCHC: 34.1 g/dL (ref 30.0–36.0)
MCV: 94.8 fL (ref 80.0–100.0)
Platelets: 245 K/uL (ref 150–400)
RBC: 2.91 MIL/uL — ABNORMAL LOW (ref 3.87–5.11)
RDW: 13.5 % (ref 11.5–15.5)
WBC: 11.1 K/uL — ABNORMAL HIGH (ref 4.0–10.5)
nRBC: 0 % (ref 0.0–0.2)

## 2023-10-26 LAB — GLUCOSE, CAPILLARY
Glucose-Capillary: 207 mg/dL — ABNORMAL HIGH (ref 70–99)
Glucose-Capillary: 226 mg/dL — ABNORMAL HIGH (ref 70–99)
Glucose-Capillary: 262 mg/dL — ABNORMAL HIGH (ref 70–99)
Glucose-Capillary: 277 mg/dL — ABNORMAL HIGH (ref 70–99)

## 2023-10-26 MED ORDER — OXYCODONE-ACETAMINOPHEN 5-325 MG PO TABS
1.0000 | ORAL_TABLET | Freq: Four times a day (QID) | ORAL | Status: DC | PRN
  Administered 2023-10-26: 1 via ORAL
  Filled 2023-10-26: qty 1

## 2023-10-26 MED ORDER — HYDROXYZINE HCL 25 MG PO TABS
25.0000 mg | ORAL_TABLET | Freq: Three times a day (TID) | ORAL | Status: DC | PRN
  Administered 2023-10-27 – 2023-10-29 (×3): 25 mg via ORAL
  Filled 2023-10-26 (×4): qty 1

## 2023-10-26 MED ORDER — LISINOPRIL 10 MG PO TABS
10.0000 mg | ORAL_TABLET | Freq: Every day | ORAL | Status: DC
Start: 2023-10-26 — End: 2023-10-30
  Administered 2023-10-26 – 2023-10-30 (×5): 10 mg via ORAL
  Filled 2023-10-26 (×5): qty 1

## 2023-10-26 MED ORDER — MORPHINE SULFATE (PF) 2 MG/ML IV SOLN: 1.0000 mg | INTRAVENOUS | Status: DC | PRN

## 2023-10-26 MED ORDER — QUETIAPINE FUMARATE 25 MG PO TABS
25.0000 mg | ORAL_TABLET | Freq: Every day | ORAL | Status: DC
  Administered 2023-10-26 – 2023-10-28 (×3): 50 mg via ORAL
  Administered 2023-10-29: 25 mg via ORAL
  Filled 2023-10-26 (×3): qty 2
  Filled 2023-10-26: qty 1

## 2023-10-26 NOTE — Progress Notes (Signed)
 PROGRESS NOTE    Traci Carter  FMW:[id number] DOB: May 01, 1945 DOA: 10/16/2023 PCP: Corlis Honor [name], MD   Assessment & Plan:   Principal Problem:   Altered mental status Active Problems:   Acute renal failure with tubular necrosis (HCC)   Hypovolemic shock (HCC)   Severe sepsis with septic shock (HCC)   Metabolic acidosis  Assessment and Plan: Septic shock : present on admission likely secondary to UTI and probable intrabdominal source,  diarrhea. Completed abx course. Resolved   AKI: as per nephro. Did have CRRT for a short period. No indication for HD currently as per nephro. Nephro following and recs apprec   Acute metabolic acidosis: resolved  Hyperkalemia: resolved   Hypokalemia: WNL today    Hypomagnesemia: will re-check in AM    PAF: continue on eliquis    Hx of complete heart block: s/p dual chamber pacemaker  HTN: restarted home dose of keppra     Hx of seizures: continue on home dose of keppra    DM2: well controlled, HbA1c 5.7. Continue on SSI w/ accuchecks    Likely dementia: continue w/ supportive care    Generalized weakness: PT/OT recs SNF. Recent multiple falls  OSA: not on CPAP  Hx of Asthma/COPD: unknown stage and/or severity. Bronchodilators prn  Obesity: BMI 37.0. Would benefit from weight loss     DVT prophylaxis: eliquis  Code Status: Full Family Communication:  Disposition Plan: needs SNF placement  Status is: Inpatient Remains inpatient appropriate because: medically stable. Needs SNF placement and insurance auth, CM is working on this    Level of care: Med-Surg Consultants:  Nephro   Procedures:   Antimicrobials:    Subjective: Pt c/o malaise   Objective: Vitals:   10/25/23 2037 10/26/23 0453 10/26/23 0500 10/26/23 0730  BP: (!) 127/57 (!) 149/88  (!) 160/93  Pulse: 72 69  69  Resp: 17 15  17   Temp: 97.8 F (36.6 C) (!) 97.5 F (36.4 C)  97.8 F (36.6 C)  TempSrc:  Oral  Oral  SpO2: 100% 98%  97%  Weight:    97.5 kg   Height:       No intake or output data in the 24 hours ending 10/26/23 0827  Filed Weights   10/24/23 0329 10/25/23 0316 10/26/23 0500  Weight: 99.5 kg 94.9 kg 97.5 kg    Examination:  General exam: Appears comfortable   Respiratory system: clear breath sounds b/l  Cardiovascular system: S1/S2+. No rubs or gallops  Gastrointestinal system: abd is soft, NT, obese & hypoactive bowel sounds  Central nervous system: alert & awake. Moves all extremities  Psychiatry: Judgement and insight appears at baseline. Flat mood and affect    Data Reviewed: I have personally reviewed following labs and imaging studies  CBC: Recent Labs  Lab 10/20/23 0431 10/21/23 0418 10/22/23 0515 10/23/23 0637 10/26/23 0422  WBC 8.0 10.1 10.4 10.6* 11.1*  HGB 9.1* 9.6* 9.7* 10.5* 9.4*  HCT 26.6* 28.8* 28.8* 31.7* 27.6*  MCV 94.0 95.0 92.9 94.3 94.8  PLT 112* 113* 121* 139* 245   Basic Metabolic Panel: Recent Labs  Lab 10/19/23 1615 10/19/23 1615 10/20/23 0431 10/21/23 0418 10/22/23 0515 10/23/23 0637 10/24/23 0458 10/25/23 0424 10/25/23 1911 10/26/23 0422  NA 135  --  137 141 141 141 141 140 139 142  K 3.4*  --  3.3* 3.6 3.3* 3.5 3.7 3.7 3.8 3.7  CL 102  --  102 105 104 101 103 104 101 105  CO2 24  --  25  26 27 26 26 27 24 28   GLUCOSE 259*  --  162* 128* 232* 224* 295* 301* 309* 205*  BUN 17  --  22 32* 37* 35* 33* 33* 30* 27*  CREATININE 1.08*  --  1.67* 2.54* 2.44* 2.27* 1.92* 1.55* 1.56* 1.51*  CALCIUM  8.0*  --  8.5* 8.7* 8.6* 9.1 8.8* 8.8* 9.3 8.9  MG  --    < > 2.5* 2.1 1.9 1.6* 1.7 1.6*  --   --   PHOS 1.9*  --  4.0 4.6  --   --   --   --   --   --    < > = values in this interval not displayed.   GFR: Estimated Creatinine Clearance: 35.2 mL/min (A) (by C-G formula based on SCr of 1.51 mg/dL (H)). Liver Function Tests: Recent Labs  Lab 10/19/23 1615 10/20/23 0431 10/21/23 0418 10/23/23 0637  AST  --   --   --  14*  ALT  --   --   --  13  ALKPHOS  --   --   --   74  BILITOT  --   --   --  0.9  PROT  --   --   --  6.3*  ALBUMIN  3.3* 3.2* 3.1* 3.3*   No results for input(s): LIPASE, AMYLASE in the last 168 hours. No results for input(s): AMMONIA in the last 168 hours. Coagulation Profile: No results for input(s): INR, PROTIME in the last 168 hours. Cardiac Enzymes: No results for input(s): CKTOTAL, CKMB, CKMBINDEX, TROPONINI in the last 168 hours. BNP (last 3 results) No results for input(s): PROBNP in the last 8760 hours. HbA1C: No results for input(s): HGBA1C in the last 72 hours. CBG: Recent Labs  Lab 10/21/23 0311 10/21/23 0710 10/25/23 1638 10/25/23 2054 10/26/23 0731  GLUCAP 121* 102* 401* 184* 207*   Lipid Profile: No results for input(s): CHOL, HDL, LDLCALC, TRIG, CHOLHDL, LDLDIRECT in the last 72 hours. Thyroid Function Tests: No results for input(s): TSH, T4TOTAL, FREET4, T3FREE, THYROIDAB in the last 72 hours. Anemia Panel: No results for input(s): VITAMINB12, FOLATE, FERRITIN, TIBC, IRON, RETICCTPCT in the last 72 hours. Sepsis Labs: No results for input(s): PROCALCITON, LATICACIDVEN in the last 168 hours.  Recent Results (from the past 240 hours)  Urine Culture (for pregnant, neutropenic or urologic patients or patients with an indwelling urinary catheter)     Status: Abnormal   Collection Time: 10/16/23 10:04 PM   Specimen: Urine, Random  Result Value Ref Range Status   Specimen Description   Final    URINE, RANDOM Performed at [facility], [address] [place], [place] [address]    Special Requests   Final    NONE Performed at [facility], [address] [place], [place] [address]    Culture 80,000 COLONIES/mL ESCHERICHIA COLI (A)  Final   Report Status 10/19/2023 FINAL  Final   Organism ID, Bacteria ESCHERICHIA COLI (A)  Final      Susceptibility   Escherichia coli - MIC*    AMPICILLIN >=32 RESISTANT Resistant      CEFAZOLIN  <=4 SENSITIVE Sensitive     CEFEPIME <=0.12 SENSITIVE Sensitive     CEFTRIAXONE  <=0.25 SENSITIVE Sensitive     CIPROFLOXACIN <=0.25 SENSITIVE Sensitive     GENTAMICIN 8 INTERMEDIATE Intermediate     IMIPENEM <=0.25 SENSITIVE Sensitive     NITROFURANTOIN <=16 SENSITIVE Sensitive     TRIMETH/SULFA >=320 RESISTANT Resistant     AMPICILLIN/SULBACTAM  4 SENSITIVE Sensitive     PIP/TAZO <=4 SENSITIVE Sensitive ug/mL    * 80,000 COLONIES/mL ESCHERICHIA COLI  MRSA Next Gen by PCR, Nasal     Status: None   Collection Time: 10/16/23 11:05 PM   Specimen: Nasal Mucosa; Nasal Swab  Result Value Ref Range Status   MRSA by PCR Next Gen NOT DETECTED NOT DETECTED Final    Comment: (NOTE) The GeneXpert MRSA Assay (FDA approved for NASAL specimens only), is one component of a comprehensive MRSA colonization surveillance program. It is not intended to diagnose MRSA infection nor to guide or monitor treatment for MRSA infections. Test performance is not FDA approved in patients less than 86 years old. Performed at [facility], [address] [place], [place] [address]   Resp panel by RT-PCR (RSV, Flu A&B, Covid) Anterior Nasal Swab     Status: None   Collection Time: 10/17/23 10:03 AM   Specimen: Anterior Nasal Swab  Result Value Ref Range Status   SARS Coronavirus 2 by RT PCR NEGATIVE NEGATIVE Final    Comment: (NOTE) SARS-CoV-2 target nucleic acids are NOT DETECTED.  The SARS-CoV-2 RNA is generally detectable in upper respiratory specimens during the acute phase of infection. The lowest concentration of SARS-CoV-2 viral copies this assay can detect is 138 copies/mL. A negative result does not preclude SARS-Cov-2 infection and should not be used as the sole basis for treatment or other patient management decisions. A negative result may occur with  improper specimen collection/handling, submission of specimen other than nasopharyngeal swab, presence of viral mutation(s)  within the areas targeted by this assay, and inadequate number of viral copies(<138 copies/mL). A negative result must be combined with clinical observations, patient history, and epidemiological information. The expected result is Negative.  Fact Sheet for Patients:  BloggerCourse.com  Fact Sheet for Healthcare Providers:  SeriousBroker.it  This test is no t yet approved or cleared by the [place]  FDA and  has been authorized for detection and/or diagnosis of SARS-CoV-2 by FDA under an Emergency Use Authorization (EUA). This EUA will remain  in effect (meaning this test can be used) for the duration of the COVID-19 declaration under Section 564(b)(1) of the Act, 21 U.S.C.section 360bbb-3(b)(1), unless the authorization is terminated  or revoked sooner.       Influenza A by PCR NEGATIVE NEGATIVE Final   Influenza B by PCR NEGATIVE NEGATIVE Final    Comment: (NOTE) The Xpert Xpress SARS-CoV-2/FLU/RSV plus assay is intended as an aid in the diagnosis of influenza from Nasopharyngeal swab specimens and should not be used as a sole basis for treatment. Nasal washings and aspirates are unacceptable for Xpert Xpress SARS-CoV-2/FLU/RSV testing.  Fact Sheet for Patients: BloggerCourse.com  Fact Sheet for Healthcare Providers: SeriousBroker.it  This test is not yet approved or cleared by the [place]  FDA and has been authorized for detection and/or diagnosis of SARS-CoV-2 by FDA under an Emergency Use Authorization (EUA). This EUA will remain in effect (meaning this test can be used) for the duration of the COVID-19 declaration under Section 564(b)(1) of the Act, 21 U.S.C. section 360bbb-3(b)(1), unless the authorization is terminated or revoked.     Resp Syncytial Virus by PCR NEGATIVE NEGATIVE Final    Comment: (NOTE) Fact Sheet for  Patients: BloggerCourse.com  Fact Sheet for Healthcare Providers: SeriousBroker.it  This test is not yet approved or cleared by the [place]  FDA and has been authorized for detection and/or diagnosis of SARS-CoV-2 by FDA under an Emergency Use  Authorization (EUA). This EUA will remain in effect (meaning this test can be used) for the duration of the COVID-19 declaration under Section 564(b)(1) of the Act, 21 U.S.C. section 360bbb-3(b)(1), unless the authorization is terminated or revoked.  Performed at [facility], [address] [place], [place] [address]   Respiratory (~20 pathogens) panel by PCR     Status: None   Collection Time: 10/17/23 10:03 AM   Specimen: Nasopharyngeal Swab; Respiratory  Result Value Ref Range Status   Adenovirus NOT DETECTED NOT DETECTED Final   Coronavirus 229E NOT DETECTED NOT DETECTED Final    Comment: (NOTE) The Coronavirus on the Respiratory Panel, DOES NOT test for the novel  Coronavirus (2019 nCoV)    Coronavirus HKU1 NOT DETECTED NOT DETECTED Final   Coronavirus NL63 NOT DETECTED NOT DETECTED Final   Coronavirus OC43 NOT DETECTED NOT DETECTED Final   Metapneumovirus NOT DETECTED NOT DETECTED Final   Rhinovirus / Enterovirus NOT DETECTED NOT DETECTED Final   Influenza A NOT DETECTED NOT DETECTED Final   Influenza B NOT DETECTED NOT DETECTED Final   Parainfluenza Virus 1 NOT DETECTED NOT DETECTED Final   Parainfluenza Virus 2 NOT DETECTED NOT DETECTED Final   Parainfluenza Virus 3 NOT DETECTED NOT DETECTED Final   Parainfluenza Virus 4 NOT DETECTED NOT DETECTED Final   Respiratory Syncytial Virus NOT DETECTED NOT DETECTED Final   Bordetella pertussis NOT DETECTED NOT DETECTED Final   Bordetella Parapertussis NOT DETECTED NOT DETECTED Final   Chlamydophila pneumoniae NOT DETECTED NOT DETECTED Final   Mycoplasma pneumoniae NOT DETECTED NOT DETECTED Final    Comment: Performed at  [facility], [address], [place], [place] [address]  Culture, blood (Routine X 2) w Reflex to ID Panel     Status: None   Collection Time: 10/17/23 11:05 AM   Specimen: BLOOD  Result Value Ref Range Status   Specimen Description BLOOD BLOOD RIGHT HAND  Final   Special Requests   Final    BOTTLES DRAWN AEROBIC ONLY Blood Culture results may not be optimal due to an inadequate volume of blood received in culture bottles   Culture   Final    NO GROWTH 5 DAYS Performed at [facility], [address] [place], [place] [address]    Report Status 10/22/2023 FINAL  Final         Radiology Studies: No results found.      Scheduled Meds:  apixaban   5 mg Oral BID   vitamin C   500 mg Oral BID   atorvastatin   20 mg Oral Daily   Chlorhexidine  Gluconate Cloth  6 each Topical Daily   famotidine   20 mg Oral Daily   insulin  aspart  0-5 Units Subcutaneous QHS   insulin  aspart  0-9 Units Subcutaneous TID WC   levETIRAcetam   500 mg Oral BID   multivitamin with minerals  1 tablet Oral Daily   traZODone   50 mg Oral QHS   Continuous Infusions:  anticoagulant sodium citrate        LOS: 10 days      [name], MD [facility] Pager [phone]  If 7PM-7AM, please contact night-coverage [email or web address]  10/26/2023, 8:27 AM

## 2023-10-26 NOTE — Inpatient Diabetes Management (Signed)
 Inpatient Diabetes Program Recommendations  AACE/ADA: New Consensus Statement on Inpatient Glycemic Control (2015)  Target Ranges:  Prepandial:   less than 140 mg/dL      Peak postprandial:   less than 180 mg/dL (1-2 hours)      Critically ill patients:  140 - 180 mg/dL   Lab Results  Component Value Date   GLUCAP 226 (H) 10/26/2023   HGBA1C 5.7 (H) 10/17/2023    Review of Glycemic Control  Latest Reference Range & Units 10/25/23 20:54 10/26/23 07:31 10/26/23 11:40  Glucose-Capillary 70 - 99 mg/dL 815 (H) 792 (H) 773 (H)  Diabetes history: DM 2 Outpatient Diabetes medications:  Glucotrol  2.5 mg XL, Metformin  1000 mg bid Current orders for Inpatient glycemic control:  Novolog  0-9 units tid with meals and HS Inpatient Diabetes Program Recommendations:    Consider adding Semglee  10 units daily.   Thanks,  Randall Bullocks, RN, BC-ADM Inpatient Diabetes Coordinator Pager [phone]  (8a-5p0

## 2023-10-26 NOTE — Progress Notes (Signed)
 Occupational Therapy Treatment Patient Details Name: Traci Carter MRN: [id number] DOB: [birth year] Today's Date: 10/26/2023   History of present illness Traci Carter is a 78yoF who [facility] on 10/16[facility]23 c AMS, diarrhea, blood in stool, emesis x2 days. Frequent falls recently at home. PMH: PAF, PPM s/p HB, HTN, CVA, DM2. Pt admitted with circulatory shock 2/2 UTI and AKI. Pt requires ICU level care and transferred to 1a from ICU on 10/21/23.   OT comments  Upon entering the room, pt supine in bed and reports having been up today. However, nursing reports pt has refused all attempts. Pt continues to tell therapist,  Not today and  I don't really want to do this. Pt reports she plans on laying in bed until she goes to rehab. OT informed pt of OT purpose and how pt must participate in order for rehab to continue to be a recommendation. Pt is very argumentative this session and self limiting. Mod A for supine to sit and pt sits on EOB for several minutes. Set up A to wash face and hands. Pt continues to refuse attempts to stand or transfer. Did discuss plan to see her in afternoon and requesting anxiety medication before. OT will speak with team and attempt to set up for next session in hopes of active participation.       If plan is discharge home, recommend the following:  A lot of help with walking and/or transfers;A lot of help with bathing/dressing/bathroom;Assistance with cooking/housework;Assistance with feeding;Assist for transportation;Help with stairs or ramp for entrance;Direct supervision/assist for medications management         Precautions / Restrictions Precautions Precautions: Fall       Mobility Bed Mobility Overal bed mobility: Needs Assistance Bed Mobility: Supine to Sit, Sit to Supine     Supine to sit: Mod assist Sit to supine: Mod assist        Transfers                   General transfer comment: pt refusal     Balance Overall balance  assessment: Needs assistance Sitting-balance support: Bilateral upper extremity supported, Feet supported Sitting balance-Leahy Scale: Good                                     ADL either performed or assessed with clinical judgement   ADL Overall ADL's : Needs assistance/impaired     Grooming: Wash/dry hands;Wash/dry face;Sitting;Set up;Supervision/safety                                      Extremity/Trunk Assessment Upper Extremity Assessment Upper Extremity Assessment: Generalized weakness   Lower Extremity Assessment Lower Extremity Assessment: Generalized weakness        Vision Patient Visual Report: No change from baseline     Perception     Praxis     Communication Communication Communication: No apparent difficulties   Cognition Arousal: Alert Behavior During Therapy: WFL for tasks assessed/performed, Flat affect Cognition: Cognition impaired   Orientation impairments: Time, Situation Awareness: Intellectual awareness impaired                         Following commands: Impaired Following commands impaired: Follows one step commands with increased time      Cueing   Cueing  Techniques: Verbal cues, Gestural cues, Tactile cues, Visual cues    Frequency  Min 2X/week        Progress Toward Goals  OT Goals(current goals can now be found in the care plan section)  Progress towards OT goals: OT to reassess next treatment      AM-PAC OT 6 Clicks Daily Activity     Outcome Measure   Help from another person eating meals?: A Lot Help from another person taking care of personal grooming?: A Lot Help from another person toileting, which includes using toliet, bedpan, or urinal?: A Lot Help from another person bathing (including washing, rinsing, drying)?: A Lot Help from another person to put on and taking off regular upper body clothing?: A Lot Help from another person to put on and taking off regular  lower body clothing?: A Lot 6 Click Score: 12    End of Session    OT Visit Diagnosis: Unsteadiness on feet (R26.81);Other abnormalities of gait and mobility (R26.89);Muscle weakness (generalized) (M62.81)   Activity Tolerance Other (comment) (Pt is self limiting)   Patient Left in bed;with call bell/phone within reach;with bed alarm set   Nurse Communication Mobility status        Time: 8462-8397 OT Time Calculation (min): 25 min  Charges: OT General Charges $OT Visit: 1 Visit OT Treatments $Self Care/Home Management : 8-22 mins $Therapeutic Activity: 8-22 mins  Izetta Claude, MS, OTR/L , CBIS ascom [phone]  10/26/23, 4:20 PM

## 2023-10-26 NOTE — TOC Initial Note (Addendum)
 Transition of Care [facility]) - Initial/Assessment Note    Patient Details  Name: Traci Carter MRN: [id number] Date of Birth: [birth year]  [facility] CM/SW Contact:    [name], RN 10/26/2023, 3:08 PM  Clinical Narrative:                  CM to patient's room regarding case management assessment. Patient amenable to case management interview. Per patient lives with husband and daughter. Per patient, daughter, provides caregiver support. Per patient has access of cane and wears eyeglasses. 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   CM and patient discussed SNF recommendations and SNF preferences. Patient prefers [facility].   CM will complete SNF workup.   CM completed FL2, alert to Dr. Trudy regarding signature. CM faxed SNF referral packet to [facility], [facility], [facility] and  [place].  Expected Discharge Plan: Skilled Nursing Facility Barriers to Discharge: Continued Medical Work up   Patient Goals and CMS Choice    SNF--[facility]   Expected Discharge Plan and Services    SNF Post Acute Care Choice: Durable Medical Equipment Living arrangements for the past 2 months: Single Family Home                   Prior Living Arrangements/Services Living arrangements for the past 2 months: Single Family Home Lives with:: Adult Children, Spouse Patient language and need for interpreter reviewed:: No Do you feel safe going back to the place where you live?: Yes      Need for Family Participation in Patient Care: Yes (Comment) Care giver support system in place?: Yes (comment) Current home services: DME [facility]) Criminal Activity/Legal Involvement Pertinent to Current Situation/Hospitalization: No - Comment as needed  Activities of Daily Living   ADL Screening (condition at time of admission) Independently performs ADLs?: No Does the patient have a NEW difficulty with  bathing/dressing/toileting/self-feeding that is expected to last >3 days?: Yes (Initiates electronic notice to provider for possible OT consult) Does the patient have a NEW difficulty with getting in/out of bed, walking, or climbing stairs that is expected to last >3 days?: Yes (Initiates electronic notice to provider for possible PT consult) Does the patient have a NEW difficulty with communication that is expected to last >3 days?: No Is the patient deaf or have difficulty hearing?: No Does the patient have difficulty seeing, even when wearing glasses/contacts?: No Does the patient have difficulty concentrating, remembering, or making decisions?: Yes  Permission Sought/Granted Permission sought to share information with : Family Supports    Share Information with NAME: Ralph/Christy     Permission granted to share info w Relationship: Husband/Daughter     Emotional Assessment Appearance:: Appears stated age Attitude/Demeanor/Rapport: Engaged Affect (typically observed): Calm Orientation: : Oriented to Self, Oriented to Place Alcohol / Substance Use: Not Applicable Psych Involvement: No (comment)  Admission diagnosis:  Altered mental status [R41.82] Altered mental status, unspecified altered mental status type [R41.82] Patient Active Problem List   Diagnosis Date Noted   Acute renal failure with tubular necrosis (HCC) 10/17/2023   Hypovolemic shock (HCC) 10/17/2023   Severe sepsis with septic shock (HCC) 10/17/2023   Metabolic acidosis 10/17/2023   Altered mental status 10/16/2023   Ambien  accidental overdose, initial encounter 02/09/2021   Hypokalemia 02/09/2021   Hypocalcemia 02/09/2021   Acquired thrombophilia (HCC) 01/16/2021   UTI (urinary tract infection) 01/14/2021   Acute metabolic encephalopathy 01/14/2021   Gout flare  01/14/2021   Hyperglycemia due to type 2 diabetes mellitus (HCC) 01/14/2021   Atrial fibrillation, chronic (HCC) 01/14/2021   HTN (hypertension)  01/14/2021   History of CVA (cerebrovascular accident) 01/14/2021   Fracture of femoral condyle, right, closed (HCC) 10/17/2020   Generalized weakness 09/16/2020   Type II diabetes mellitus with renal manifestations (HCC) 09/16/2020   CKD (chronic kidney disease), stage IIIa 09/16/2020   Depression with anxiety 09/16/2020   Fall 09/16/2020   UTI (urinary tract infection) 09/16/2020   Seizure (HCC) 09/16/2020   Diabetes mellitus type 2, uncomplicated (HCC) 09/10/2019   Chest pain with low risk for cardiac etiology 12/27/2018   SOB (shortness of breath) on exertion 12/27/2018   Paroxysmal atrial fibrillation (HCC) 10/16/2018   Hyperlipidemia associated with type 2 diabetes mellitus (HCC) 10/16/2018   Pacemaker 10/16/2018   History of CVA (cerebrovascular accident) 10/16/2018   OSA (obstructive sleep apnea) 10/16/2018   Fibromyalgia 10/16/2018   CAD (coronary artery disease) 10/16/2018   Diarrhea 06/09/2017   Hyperlipemia 08/15/2013   ICH (intracerebral hemorrhage) (HCC) 07/21/2012   Diabetes (HCC) 07/21/2012   Essential hypertension 07/21/2012   CVA (cerebral vascular accident) (HCC) 07/20/2012   PCP:  Corlis Honor [name], MD Pharmacy:   [facility] [id number] [address], [place] - [address] AT [facility] OF [address] [place] [address] Phone: [phone] Fax: [phone]     Social Drivers of Health (SDOH) Social History: SDOH Screenings   Food Insecurity: No Food Insecurity (10/17/2023)  Housing: Low Risk  (10/17/2023)  Transportation Needs: No Transportation Needs (10/17/2023)  Utilities: Not At Risk (10/17/2023)  Financial Resource Strain: Low Risk  (09/25/2020)   Received from [facility]  Social Connections: Moderately Isolated (10/17/2023)  Tobacco Use: Low Risk  (10/16/2023)   SDOH Interventions:     Readmission Risk Interventions    10/17/2023    3:58 PM  Readmission Risk Prevention Plan  PCP or Specialist Appt within 3-5 Days  Complete  Social Work Consult for Recovery Care Planning/Counseling Complete  Palliative Care Screening Not Applicable

## 2023-10-26 NOTE — Plan of Care (Signed)
   Problem: Education: Goal: Knowledge of General Education information will improve Description Including pain rating scale, medication(s)/side effects and non-pharmacologic comfort measures Outcome: Progressing   Problem: Clinical Measurements: Goal: Will remain free from infection Outcome: Progressing   Problem: Nutrition: Goal: Adequate nutrition will be maintained Outcome: Progressing   Problem: Coping: Goal: Level of anxiety will decrease Outcome: Progressing

## 2023-10-26 NOTE — Progress Notes (Signed)
 [facility]  ROUNDING NOTE   Subjective:   Patient sitting up in bed Partially competed breakfast tray Room air  Creatinine 1.51   Objective:  Vital signs in last 24 hours:  Temp:  [97.5 F (36.4 C)-98.1 F (36.7 C)] 97.8 F (36.6 C) (07/24 0730) Pulse Rate:  [69-78] 69 (07/24 0730) Resp:  [15-17] 17 (07/24 0730) BP: (127-174)/(57-93) 160/93 (07/24 0730) SpO2:  [97 %-100 %] 97 % (07/24 0730) Weight:  [97.5 kg] 97.5 kg (07/24 0500)  Weight change: 2.6 kg Filed Weights   10/24/23 0329 10/25/23 0316 10/26/23 0500  Weight: 99.5 kg 94.9 kg 97.5 kg    Intake/Output: I/O last 3 completed shifts: In: -  Out: 500 [Urine:500]   Intake/Output this shift:  Total I/O In: 120 [P.O.:120] Out: -   Physical Exam: General: NAD  Head: Normocephalic, atraumatic. Moist oral mucosal membranes  Eyes: Anicteric  Neck: Supple  Lungs:  Clear to auscultation, normal effort  Heart: Regular rate and rhythm  Abdomen:  Soft, nontender  Extremities:  No peripheral edema.  Neurologic: Awake, alert, conversant  Skin: Warm,dry, no rash  Access: Removed    Basic Metabolic Panel: Recent Labs  Lab 10/19/23 1615 10/19/23 1615 10/20/23 0431 10/21/23 0418 10/22/23 0515 10/23/23 0637 10/24/23 0458 10/25/23 0424 10/25/23 1911 10/26/23 0422  NA 135  --  137 141 141 141 141 140 139 142  K 3.4*  --  3.3* 3.6 3.3* 3.5 3.7 3.7 3.8 3.7  CL 102  --  102 105 104 101 103 104 101 105  CO2 24  --  25 26 27 26 26 27 24 28   GLUCOSE 259*  --  162* 128* 232* 224* 295* 301* 309* 205*  BUN 17  --  22 32* 37* 35* 33* 33* 30* 27*  CREATININE 1.08*  --  1.67* 2.54* 2.44* 2.27* 1.92* 1.55* 1.56* 1.51*  CALCIUM  8.0*  --  8.5* 8.7* 8.6* 9.1 8.8* 8.8* 9.3 8.9  MG  --    < > 2.5* 2.1 1.9 1.6* 1.7 1.6*  --   --   PHOS 1.9*  --  4.0 4.6  --   --   --   --   --   --    < > = values in this interval not displayed.    Liver Function Tests: Recent Labs  Lab 10/19/23 1615 10/20/23 0431  10/21/23 0418 10/23/23 0637  AST  --   --   --  14*  ALT  --   --   --  13  ALKPHOS  --   --   --  74  BILITOT  --   --   --  0.9  PROT  --   --   --  6.3*  ALBUMIN  3.3* 3.2* 3.1* 3.3*   No results for input(s): LIPASE, AMYLASE in the last 168 hours. No results for input(s): AMMONIA in the last 168 hours.  CBC: Recent Labs  Lab 10/20/23 0431 10/21/23 0418 10/22/23 0515 10/23/23 0637 10/26/23 0422  WBC 8.0 10.1 10.4 10.6* 11.1*  HGB 9.1* 9.6* 9.7* 10.5* 9.4*  HCT 26.6* 28.8* 28.8* 31.7* 27.6*  MCV 94.0 95.0 92.9 94.3 94.8  PLT 112* 113* 121* 139* 245    Cardiac Enzymes: No results for input(s): CKTOTAL, CKMB, CKMBINDEX, TROPONINI in the last 168 hours.   BNP: Invalid input(s): POCBNP  CBG: Recent Labs  Lab 10/21/23 0311 10/21/23 0710 10/25/23 1638 10/25/23 2054 10/26/23 0731  GLUCAP 121* 102* 401* 184*  207*    Microbiology: Results for orders placed or performed during the hospital encounter of 10/16/23  Urine Culture (for pregnant, neutropenic or urologic patients or patients with an indwelling urinary catheter)     Status: Abnormal   Collection Time: 10/16/23 10:04 PM   Specimen: Urine, Random  Result Value Ref Range Status   Specimen Description   Final    URINE, RANDOM Performed at [facility], [address], [place], [place] [address]    Special Requests   Final    NONE Performed at [facility], [address] [place], [place] [address]    Culture 80,000 COLONIES/mL ESCHERICHIA COLI (A)  Final   Report Status 10/19/2023 FINAL  Final   Organism ID, Bacteria ESCHERICHIA COLI (A)  Final      Susceptibility   Escherichia coli - MIC*    AMPICILLIN >=32 RESISTANT Resistant     CEFAZOLIN  <=4 SENSITIVE Sensitive     CEFEPIME <=0.12 SENSITIVE Sensitive     CEFTRIAXONE  <=0.25 SENSITIVE Sensitive     CIPROFLOXACIN <=0.25 SENSITIVE Sensitive     GENTAMICIN 8 INTERMEDIATE Intermediate     IMIPENEM <=0.25 SENSITIVE  Sensitive     NITROFURANTOIN <=16 SENSITIVE Sensitive     TRIMETH/SULFA >=320 RESISTANT Resistant     AMPICILLIN/SULBACTAM 4 SENSITIVE Sensitive     PIP/TAZO <=4 SENSITIVE Sensitive ug/mL    * 80,000 COLONIES/mL ESCHERICHIA COLI  MRSA Next Gen by PCR, Nasal     Status: None   Collection Time: 10/16/23 11:05 PM   Specimen: Nasal Mucosa; Nasal Swab  Result Value Ref Range Status   MRSA by PCR Next Gen NOT DETECTED NOT DETECTED Final    Comment: (NOTE) The GeneXpert MRSA Assay (FDA approved for NASAL specimens only), is one component of a comprehensive MRSA colonization surveillance program. It is not intended to diagnose MRSA infection nor to guide or monitor treatment for MRSA infections. Test performance is not FDA approved in patients less than 61 years old. Performed at [facility], [address] [place], [place] [address]   Resp panel by RT-PCR (RSV, Flu A&B, Covid) Anterior Nasal Swab     Status: None   Collection Time: 10/17/23 10:03 AM   Specimen: Anterior Nasal Swab  Result Value Ref Range Status   SARS Coronavirus 2 by RT PCR NEGATIVE NEGATIVE Final    Comment: (NOTE) SARS-CoV-2 target nucleic acids are NOT DETECTED.  The SARS-CoV-2 RNA is generally detectable in upper respiratory specimens during the acute phase of infection. The lowest concentration of SARS-CoV-2 viral copies this assay can detect is 138 copies/mL. A negative result does not preclude SARS-Cov-2 infection and should not be used as the sole basis for treatment or other patient management decisions. A negative result may occur with  improper specimen collection/handling, submission of specimen other than nasopharyngeal swab, presence of viral mutation(s) within the areas targeted by this assay, and inadequate number of viral copies(<138 copies/mL). A negative result must be combined with clinical observations, patient history, and epidemiological information. The expected result is  Negative.  Fact Sheet for Patients:  BloggerCourse.com  Fact Sheet for Healthcare Providers:  SeriousBroker.it  This test is no t yet approved or cleared by the [place]  FDA and  has been authorized for detection and/or diagnosis of SARS-CoV-2 by FDA under an Emergency Use Authorization (EUA). This EUA will remain  in effect (meaning this test can be used) for the duration of the COVID-19 declaration under Section 564(b)(1) of the  Act, 21 U.S.C.section 360bbb-3(b)(1), unless the authorization is terminated  or revoked sooner.       Influenza A by PCR NEGATIVE NEGATIVE Final   Influenza B by PCR NEGATIVE NEGATIVE Final    Comment: (NOTE) The Xpert Xpress SARS-CoV-2/FLU/RSV plus assay is intended as an aid in the diagnosis of influenza from Nasopharyngeal swab specimens and should not be used as a sole basis for treatment. Nasal washings and aspirates are unacceptable for Xpert Xpress SARS-CoV-2/FLU/RSV testing.  Fact Sheet for Patients: BloggerCourse.com  Fact Sheet for Healthcare Providers: SeriousBroker.it  This test is not yet approved or cleared by the [place]  FDA and has been authorized for detection and/or diagnosis of SARS-CoV-2 by FDA under an Emergency Use Authorization (EUA). This EUA will remain in effect (meaning this test can be used) for the duration of the COVID-19 declaration under Section 564(b)(1) of the Act, 21 U.S.C. section 360bbb-3(b)(1), unless the authorization is terminated or revoked.     Resp Syncytial Virus by PCR NEGATIVE NEGATIVE Final    Comment: (NOTE) Fact Sheet for Patients: BloggerCourse.com  Fact Sheet for Healthcare Providers: SeriousBroker.it  This test is not yet approved or cleared by the [place]  FDA and has been authorized for detection and/or diagnosis of  SARS-CoV-2 by FDA under an Emergency Use Authorization (EUA). This EUA will remain in effect (meaning this test can be used) for the duration of the COVID-19 declaration under Section 564(b)(1) of the Act, 21 U.S.C. section 360bbb-3(b)(1), unless the authorization is terminated or revoked.  Performed at [facility], [address] [place], [place] [address]   Respiratory (~20 pathogens) panel by PCR     Status: None   Collection Time: 10/17/23 10:03 AM   Specimen: Nasopharyngeal Swab; Respiratory  Result Value Ref Range Status   Adenovirus NOT DETECTED NOT DETECTED Final   Coronavirus 229E NOT DETECTED NOT DETECTED Final    Comment: (NOTE) The Coronavirus on the Respiratory Panel, DOES NOT test for the novel  Coronavirus (2019 nCoV)    Coronavirus HKU1 NOT DETECTED NOT DETECTED Final   Coronavirus NL63 NOT DETECTED NOT DETECTED Final   Coronavirus OC43 NOT DETECTED NOT DETECTED Final   Metapneumovirus NOT DETECTED NOT DETECTED Final   Rhinovirus / Enterovirus NOT DETECTED NOT DETECTED Final   Influenza A NOT DETECTED NOT DETECTED Final   Influenza B NOT DETECTED NOT DETECTED Final   Parainfluenza Virus 1 NOT DETECTED NOT DETECTED Final   Parainfluenza Virus 2 NOT DETECTED NOT DETECTED Final   Parainfluenza Virus 3 NOT DETECTED NOT DETECTED Final   Parainfluenza Virus 4 NOT DETECTED NOT DETECTED Final   Respiratory Syncytial Virus NOT DETECTED NOT DETECTED Final   Bordetella pertussis NOT DETECTED NOT DETECTED Final   Bordetella Parapertussis NOT DETECTED NOT DETECTED Final   Chlamydophila pneumoniae NOT DETECTED NOT DETECTED Final   Mycoplasma pneumoniae NOT DETECTED NOT DETECTED Final    Comment: Performed at [facility], [address] [place], [place] [address]  Culture, blood (Routine X 2) w Reflex to ID Panel     Status: None   Collection Time: 10/17/23 11:05 AM   Specimen: BLOOD  Result Value Ref Range Status   Specimen Description BLOOD BLOOD RIGHT  HAND  Final   Special Requests   Final    BOTTLES DRAWN AEROBIC ONLY Blood Culture results may not be optimal due to an inadequate volume of blood received in culture bottles   Culture   Final    NO GROWTH  5 DAYS Performed at [facility], [address] [place], [place] [address]    Report Status 10/22/2023 FINAL  Final    Coagulation Studies: No results for input(s): LABPROT, INR in the last 72 hours.   Urinalysis: No results for input(s): COLORURINE, LABSPEC, PHURINE, GLUCOSEU, HGBUR, BILIRUBINUR, KETONESUR, PROTEINUR, UROBILINOGEN, NITRITE, LEUKOCYTESUR in the last 72 hours.  Invalid input(s): APPERANCEUR     Imaging: No results found.    Medications:    anticoagulant sodium citrate       apixaban   5 mg Oral BID   vitamin C   500 mg Oral BID   atorvastatin   20 mg Oral Daily   Chlorhexidine  Gluconate Cloth  6 each Topical Daily   famotidine   20 mg Oral Daily   insulin  aspart  0-5 Units Subcutaneous QHS   insulin  aspart  0-9 Units Subcutaneous TID WC   levETIRAcetam   500 mg Oral BID   lisinopril   10 mg Oral Daily   multivitamin with minerals  1 tablet Oral Daily   QUEtiapine   25-50 mg Oral QHS   traZODone   50 mg Oral QHS   acetaminophen , anticoagulant sodium citrate , loperamide   Assessment/ Plan:  [name] is a 78 y.o.  female with past medical conditions including GERD, CVA, hypertension, diabetes, hyperlipidemia, CAD, gout, obstructive sleep apnea, UTI, chronic kidney disease stage III, who was admitted to [facility] on 10/16/2023 for Altered mental status [R41.82] Altered mental status, unspecified altered mental status type [R41.82]   Acute kidney injury likely secondary to septic UTI. Baseline unknown. UA positive for leukocytes and bacteria. No recent IV contrast exposure. Creatinine on ED arrival 8.32.  Patient did receive CRRT for short period.  Creatinine has stabilized. Will schedule follow up outpatient in  our office.   Lab Results  Component Value Date   CREATININE 1.51 (H) 10/26/2023   CREATININE 1.56 (H) 10/25/2023   CREATININE 1.55 (H) 10/25/2023    Intake/Output Summary (Last 24 hours) at 10/26/2023 1058 Last data filed at 10/26/2023 0900 Gross per 24 hour  Intake 120 ml  Output --  Net 120 ml   2.  Hypokalemia/hypermagnesium.  Potassium 3.7.  Magnesium  1.6.  Corrections per primary team..   3.  Acute metabolic acidosis.  Serum bicarb 10 on ED arrival.  Corrected with dialysis and IV supplementation.       4.  Sepsis likely secondary to UTI with suspected abdominal involvement based on nausea, vomiting, and diarrhea.  UA positive for UTI, urine culture positive for E. coli.  Patient does have history of E. coli and Enterococcus facilities UTI.  All antibiotics completed.  We will sign off at this time and continue monitoring in 1-2 weeks in office.     LOS: 10 Traci Carter 7/24/202510:58 AM

## 2023-10-26 NOTE — NC FL2 (Signed)
 [place]  MEDICAID FL2 LEVEL OF CARE FORM     IDENTIFICATION  Patient Name: Traci Carter Birthdate: 05/06/1945 Sex: female Admission Date (Current Location): 10/16/2023  [facility] and [place] Number:  Chiropodist and Address:  [facility], [address] [place], [place] [address]      Provider Number: [id number]  Attending Physician Name and Address:  Trudy Anthony HERO, MD  Relative Name and Phone Number:       Current Level of Care:   Recommended Level of Care: Skilled Nursing Facility Prior Approval Number:    Date Approved/Denied: 09/17/20 PASRR Number: [id number]  Discharge Plan: SNF    Current Diagnoses: Patient Active Problem List   Diagnosis Date Noted   Acute renal failure with tubular necrosis (HCC) 10/17/2023   Hypovolemic shock (HCC) 10/17/2023   Severe sepsis with septic shock (HCC) 10/17/2023   Metabolic acidosis 10/17/2023   Altered mental status 10/16/2023   Ambien  accidental overdose, initial encounter 02/09/2021   Hypokalemia 02/09/2021   Hypocalcemia 02/09/2021   Acquired thrombophilia (HCC) 01/16/2021   UTI (urinary tract infection) 01/14/2021   Acute metabolic encephalopathy 01/14/2021   Gout flare 01/14/2021   Hyperglycemia due to type 2 diabetes mellitus (HCC) 01/14/2021   Atrial fibrillation, chronic (HCC) 01/14/2021   HTN (hypertension) 01/14/2021   History of CVA (cerebrovascular accident) 01/14/2021   Fracture of femoral condyle, right, closed (HCC) 10/17/2020   Generalized weakness 09/16/2020   Type II diabetes mellitus with renal manifestations (HCC) 09/16/2020   CKD (chronic kidney disease), stage IIIa 09/16/2020   Depression with anxiety 09/16/2020   Fall 09/16/2020   UTI (urinary tract infection) 09/16/2020   Seizure (HCC) 09/16/2020   Diabetes mellitus type 2, uncomplicated (HCC) 09/10/2019   Chest pain with low risk for cardiac etiology 12/27/2018   SOB (shortness of breath) on  exertion 12/27/2018   Paroxysmal atrial fibrillation (HCC) 10/16/2018   Hyperlipidemia associated with type 2 diabetes mellitus (HCC) 10/16/2018   Pacemaker 10/16/2018   History of CVA (cerebrovascular accident) 10/16/2018   OSA (obstructive sleep apnea) 10/16/2018   Fibromyalgia 10/16/2018   CAD (coronary artery disease) 10/16/2018   Diarrhea 06/09/2017   Hyperlipemia 08/15/2013   ICH (intracerebral hemorrhage) (HCC) 07/21/2012   Diabetes (HCC) 07/21/2012   Essential hypertension 07/21/2012   CVA (cerebral vascular accident) (HCC) 07/20/2012    Orientation RESPIRATION BLADDER Height & Weight     Self, Situation, Place  Normal Incontinent Weight: 97.5 kg Height:  5' 3 (160 cm)  BEHAVIORAL SYMPTOMS/MOOD NEUROLOGICAL BOWEL NUTRITION STATUS    Convulsions/Seizures Incontinent  (Please see discharge summary)  AMBULATORY STATUS COMMUNICATION OF NEEDS Skin    (Not attempted) Verbally  (Dry, erythema/redness on sacrum, non-tenting)                       Personal Care Assistance Level of Assistance  Bathing, Feeding, Dressing Bathing Assistance: Limited assistance Feeding assistance: Limited assistance Dressing Assistance: Limited assistance     Functional Limitations Info  Sight Sight Info: Impaired        SPECIAL CARE FACTORS FREQUENCY  PT (By licensed PT), OT (By licensed OT)     PT Frequency: 5 x per week OT Frequency: 5 x per week            Contractures      Additional Factors Info  Code Status, Allergies Code Status Info: Full Code Allergies Info: Codeine  No severity specified - Other (See Comments) Comments  Codeine  No severity or reactions specified  Digoxin  No severity or reactions specified Comments  Lactose Intolerance (gi)  No severity or reactions specified  Lanoxin (digoxin)  No severity or reactions specified  Penicillins  No severity or reactions specified  Zoloft    Received from outside source  No severity or reactions specified  Zoloft  (sertraline Hcl)  No severity or reactions specified  Penicillins    Received from outside source  Low - Other (See Comments), Rash           Current Medications (10/26/2023):  This is the current hospital active medication list Current Facility-Administered Medications  Medication Dose Route Frequency Provider Last Rate Last Admin   acetaminophen  (TYLENOL ) tablet 650 mg  650 mg Oral Q6H PRN Rust-Chester, Britton L, NP   650 mg at 10/25/23 2103   anticoagulant sodium citrate  solution 5 mL  5 mL Intracatheter Continuous PRN Nada Adriana BIRCH, RPH   5 mL at 10/19/23 1803   apixaban  (ELIQUIS ) tablet 5 mg  5 mg Oral BID Awanda City, MD   5 mg at 10/26/23 0915   ascorbic acid  (VITAMIN C ) tablet 500 mg  500 mg Oral BID Awanda City, MD   500 mg at 10/26/23 0915   atorvastatin  (LIPITOR) tablet 20 mg  20 mg Oral Daily Ouma, Elizabeth Achieng, NP   20 mg at 10/26/23 9083   Chlorhexidine  Gluconate Cloth 2 % PADS 6 each  6 each Topical Daily Aleskerov, Fuad, MD   6 each at 10/26/23 9083   famotidine  (PEPCID ) tablet 20 mg  20 mg Oral Daily Lenon Elsie HERO, RPH   20 mg at 10/26/23 0915   insulin  aspart (novoLOG ) injection 0-5 Units  0-5 Units Subcutaneous QHS Trudy Anthony HERO, MD       insulin  aspart (novoLOG ) injection 0-9 Units  0-9 Units Subcutaneous TID WC Trudy Anthony HERO, MD   3 Units at 10/26/23 1149   levETIRAcetam  (KEPPRA ) tablet 500 mg  500 mg Oral BID Awanda City, MD   500 mg at 10/26/23 0915   lisinopril  (ZESTRIL ) tablet 10 mg  10 mg Oral Daily Trudy Anthony HERO, MD   10 mg at 10/26/23 1057   loperamide  (IMODIUM ) capsule 2 mg  2 mg Oral PRN Grubb, Rodney D, RPH   2 mg at 10/20/23 0309   morphine  (PF) 2 MG/ML injection 1 mg  1 mg Intravenous Q4H PRN Williams, Jamiese M, MD       multivitamin with minerals tablet 1 tablet  1 tablet Oral Daily Awanda City, MD   1 tablet at 10/26/23 0915   oxyCODONE -acetaminophen  (PERCOCET/ROXICET) 5-325 MG per tablet 1 tablet  1 tablet Oral Q6H PRN Trudy Anthony HERO, MD   1 tablet at 10/26/23 1148   QUEtiapine  (SEROQUEL ) tablet 25-50 mg  25-50 mg Oral QHS Trudy Anthony HERO, MD       traZODone  (DESYREL ) tablet 50 mg  50 mg Oral QHS Rust-Chester, Britton L, NP   50 mg at 10/25/23 2103     Discharge Medications: Please see discharge summary for a list of discharge medications.  Relevant Imaging Results:  Relevant Lab Results:   Additional Information SSN: [id number]  [name], RN

## 2023-10-27 DIAGNOSIS — R4182 Altered mental status, unspecified: Secondary | ICD-10-CM | POA: Diagnosis not present

## 2023-10-27 LAB — GLUCOSE, CAPILLARY
Glucose-Capillary: 157 mg/dL — ABNORMAL HIGH (ref 70–99)
Glucose-Capillary: 198 mg/dL — ABNORMAL HIGH (ref 70–99)
Glucose-Capillary: 343 mg/dL — ABNORMAL HIGH (ref 70–99)
Glucose-Capillary: 221 mg/dL — ABNORMAL HIGH (ref 70–99)

## 2023-10-27 LAB — CBC
HCT: 28.5 % — ABNORMAL LOW (ref 36.0–46.0)
Hemoglobin: 9.3 g/dL — ABNORMAL LOW (ref 12.0–15.0)
MCH: 31.1 pg (ref 26.0–34.0)
MCHC: 32.6 g/dL (ref 30.0–36.0)
MCV: 95.3 fL (ref 80.0–100.0)
Platelets: 292 K/uL (ref 150–400)
RBC: 2.99 MIL/uL — ABNORMAL LOW (ref 3.87–5.11)
RDW: 14 % (ref 11.5–15.5)
WBC: 10.5 K/uL (ref 4.0–10.5)
nRBC: 0 % (ref 0.0–0.2)

## 2023-10-27 LAB — BASIC METABOLIC PANEL WITH GFR
Anion gap: 8 (ref 5–15)
BUN: 28 mg/dL — ABNORMAL HIGH (ref 8–23)
CO2: 28 mmol/L (ref 22–32)
Calcium: 9.2 mg/dL (ref 8.9–10.3)
Chloride: 107 mmol/L (ref 98–111)
Creatinine, Ser: 1.27 mg/dL — ABNORMAL HIGH (ref 0.44–1.00)
GFR, Estimated: 44 mL/min — ABNORMAL LOW (ref >60)
Glucose, Bld: 166 mg/dL — ABNORMAL HIGH (ref 70–99)
Potassium: 3.5 mmol/L (ref 3.5–5.1)
Sodium: 143 mmol/L (ref 135–145)

## 2023-10-27 NOTE — Progress Notes (Addendum)
 PROGRESS NOTE    Traci Carter  FMW:[id number] DOB: January 09, 1946 DOA: 10/16/2023 PCP: Corlis Honor [name], MD   Assessment & Plan:   Principal Problem:   Altered mental status Active Problems:   Acute renal failure with tubular necrosis (HCC)   Hypovolemic shock (HCC)   Severe sepsis with septic shock (HCC)   Metabolic acidosis  Assessment and Plan: Septic shock : present on admission likely secondary to UTI and probable intrabdominal source,  diarrhea. Completed abx course. Resolved   AKI: as per nephro. Cr is trending down again today. Did have CRRT for a short period. No indication for HD currently as per nephro. Nephro signed off on 10/26/23   Acute metabolic acidosis: resolved  Hyperkalemia: resolved   Hypokalemia: WNL today    Hypomagnesemia: will re-check in AM    PAF: continue on eliquis    Hx of complete heart block: s/p dual chamber pacemaker  HTN: continue on home dose of lisinopril     Hx of seizures: continue on home dose of keppra   DM2: well controlled, HbA1c 5.7. Continue on SSI w/ accuchecks   Likely dementia: continue w/ supportive care. No formal dx of dementia    Generalized weakness: PT/OT recs SNF. Recent multiple falls  OSA: not on CPAP   Hx of Asthma/COPD: unknown stage and/or severity. Bronchodilators prn   Obesity: BMI 37.0. Would benefit from weight loss     DVT prophylaxis: eliquis  Code Status: Full Family Communication: discussed pt's care w/ pt's family at bedside and answered their questions Disposition Plan: needs SNF placement  Status is: Inpatient Remains inpatient appropriate because: medically stable. Message CM in regards to SNF placement but no response so far today     Level of care: Med-Surg Consultants:  Nephro   Procedures:   Antimicrobials:    Subjective: Pt c/o fatigue  Objective: Vitals:   10/26/23 1935 10/27/23 0500 10/27/23 0509 10/27/23 0832  BP: (!) 155/68  134/83 128/77  Pulse: 83  67 72  Resp:  14  16 17   Temp: 98.9 F (37.2 C)  (!) 97.5 F (36.4 C) 97.6 F (36.4 C)  TempSrc: Oral  Oral Oral  SpO2: 98%  99% 99%  Weight:  97 kg    Height:        Intake/Output Summary (Last 24 hours) at 10/27/2023 0921 Last data filed at 10/26/2023 2018 Gross per 24 hour  Intake 480 ml  Output --  Net 480 ml    Filed Weights   10/25/23 0316 10/26/23 0500 10/27/23 0500  Weight: 94.9 kg 97.5 kg 97 kg    Examination:  General exam: appears comfortable  Respiratory system: clear breath sounds b/l  Cardiovascular system: S1 & S2+. No rubs or clicks   Gastrointestinal system: abd is soft, NT, obese & hypoactive bowel sounds  Central nervous system: alert & awake. Moves all extremities  Psychiatry: judgement and insight appears at baseline. Flat mood and affect      Data Reviewed: I have personally reviewed following labs and imaging studies  CBC: Recent Labs  Lab 10/21/23 0418 10/22/23 0515 10/23/23 0637 10/26/23 0422 10/27/23 0349  WBC 10.1 10.4 10.6* 11.1* 10.5  HGB 9.6* 9.7* 10.5* 9.4* 9.3*  HCT 28.8* 28.8* 31.7* 27.6* 28.5*  MCV 95.0 92.9 94.3 94.8 95.3  PLT 113* 121* 139* 245 292   Basic Metabolic Panel: Recent Labs  Lab 10/21/23 0418 10/22/23 0515 10/23/23 0637 10/24/23 0458 10/25/23 0424 10/25/23 1911 10/26/23 0422 10/27/23 0349  NA 141 141 141 141  140 139 142 143  K 3.6 3.3* 3.5 3.7 3.7 3.8 3.7 3.5  CL 105 104 101 103 104 101 105 107  CO2 26 27 26 26 27 24 28 28   GLUCOSE 128* 232* 224* 295* 301* 309* 205* 166*  BUN 32* 37* 35* 33* 33* 30* 27* 28*  CREATININE 2.54* 2.44* 2.27* 1.92* 1.55* 1.56* 1.51* 1.27*  CALCIUM  8.7* 8.6* 9.1 8.8* 8.8* 9.3 8.9 9.2  MG 2.1 1.9 1.6* 1.7 1.6*  --   --   --   PHOS 4.6  --   --   --   --   --   --   --    GFR: Estimated Creatinine Clearance: 41.8 mL/min (A) (by C-G formula based on SCr of 1.27 mg/dL (H)). Liver Function Tests: Recent Labs  Lab 10/21/23 0418 10/23/23 0637  AST  --  14*  ALT  --  13  ALKPHOS  --  74   BILITOT  --  0.9  PROT  --  6.3*  ALBUMIN  3.1* 3.3*   No results for input(s): LIPASE, AMYLASE in the last 168 hours. No results for input(s): AMMONIA in the last 168 hours. Coagulation Profile: No results for input(s): INR, PROTIME in the last 168 hours. Cardiac Enzymes: No results for input(s): CKTOTAL, CKMB, CKMBINDEX, TROPONINI in the last 168 hours. BNP (last 3 results) No results for input(s): PROBNP in the last 8760 hours. HbA1C: No results for input(s): HGBA1C in the last 72 hours. CBG: Recent Labs  Lab 10/26/23 0731 10/26/23 1140 10/26/23 1648 10/26/23 2113 10/27/23 0731  GLUCAP 207* 226* 262* 277* 157*   Lipid Profile: No results for input(s): CHOL, HDL, LDLCALC, TRIG, CHOLHDL, LDLDIRECT in the last 72 hours. Thyroid Function Tests: No results for input(s): TSH, T4TOTAL, FREET4, T3FREE, THYROIDAB in the last 72 hours. Anemia Panel: No results for input(s): VITAMINB12, FOLATE, FERRITIN, TIBC, IRON, RETICCTPCT in the last 72 hours. Sepsis Labs: No results for input(s): PROCALCITON, LATICACIDVEN in the last 168 hours.  Recent Results (from the past 240 hours)  Resp panel by RT-PCR (RSV, Flu A&B, Covid) Anterior Nasal Swab     Status: None   Collection Time: 10/17/23 10:03 AM   Specimen: Anterior Nasal Swab  Result Value Ref Range Status   SARS Coronavirus 2 by RT PCR NEGATIVE NEGATIVE Final    Comment: (NOTE) SARS-CoV-2 target nucleic acids are NOT DETECTED.  The SARS-CoV-2 RNA is generally detectable in upper respiratory specimens during the acute phase of infection. The lowest concentration of SARS-CoV-2 viral copies this assay can detect is 138 copies/mL. A negative result does not preclude SARS-Cov-2 infection and should not be used as the sole basis for treatment or other patient management decisions. A negative result may occur with  improper specimen collection/handling, submission of specimen  other than nasopharyngeal swab, presence of viral mutation(s) within the areas targeted by this assay, and inadequate number of viral copies(<138 copies/mL). A negative result must be combined with clinical observations, patient history, and epidemiological information. The expected result is Negative.  Fact Sheet for Patients:  BloggerCourse.com  Fact Sheet for Healthcare Providers:  SeriousBroker.it  This test is no t yet approved or cleared by the [place]  FDA and  has been authorized for detection and/or diagnosis of SARS-CoV-2 by FDA under an Emergency Use Authorization (EUA). This EUA will remain  in effect (meaning this test can be used) for the duration of the COVID-19 declaration under Section 564(b)(1) of the Act, 21 U.S.C.section 360bbb-3(b)(1), unless  the authorization is terminated  or revoked sooner.       Influenza A by PCR NEGATIVE NEGATIVE Final   Influenza B by PCR NEGATIVE NEGATIVE Final    Comment: (NOTE) The Xpert Xpress SARS-CoV-2/FLU/RSV plus assay is intended as an aid in the diagnosis of influenza from Nasopharyngeal swab specimens and should not be used as a sole basis for treatment. Nasal washings and aspirates are unacceptable for Xpert Xpress SARS-CoV-2/FLU/RSV testing.  Fact Sheet for Patients: BloggerCourse.com  Fact Sheet for Healthcare Providers: SeriousBroker.it  This test is not yet approved or cleared by the [place]  FDA and has been authorized for detection and/or diagnosis of SARS-CoV-2 by FDA under an Emergency Use Authorization (EUA). This EUA will remain in effect (meaning this test can be used) for the duration of the COVID-19 declaration under Section 564(b)(1) of the Act, 21 U.S.C. section 360bbb-3(b)(1), unless the authorization is terminated or revoked.     Resp Syncytial Virus by PCR NEGATIVE NEGATIVE Final     Comment: (NOTE) Fact Sheet for Patients: BloggerCourse.com  Fact Sheet for Healthcare Providers: SeriousBroker.it  This test is not yet approved or cleared by the [place]  FDA and has been authorized for detection and/or diagnosis of SARS-CoV-2 by FDA under an Emergency Use Authorization (EUA). This EUA will remain in effect (meaning this test can be used) for the duration of the COVID-19 declaration under Section 564(b)(1) of the Act, 21 U.S.C. section 360bbb-3(b)(1), unless the authorization is terminated or revoked.  Performed at [facility], [address] [place], [place] [address]   Respiratory (~20 pathogens) panel by PCR     Status: None   Collection Time: 10/17/23 10:03 AM   Specimen: Nasopharyngeal Swab; Respiratory  Result Value Ref Range Status   Adenovirus NOT DETECTED NOT DETECTED Final   Coronavirus 229E NOT DETECTED NOT DETECTED Final    Comment: (NOTE) The Coronavirus on the Respiratory Panel, DOES NOT test for the novel  Coronavirus (2019 nCoV)    Coronavirus HKU1 NOT DETECTED NOT DETECTED Final   Coronavirus NL63 NOT DETECTED NOT DETECTED Final   Coronavirus OC43 NOT DETECTED NOT DETECTED Final   Metapneumovirus NOT DETECTED NOT DETECTED Final   Rhinovirus / Enterovirus NOT DETECTED NOT DETECTED Final   Influenza A NOT DETECTED NOT DETECTED Final   Influenza B NOT DETECTED NOT DETECTED Final   Parainfluenza Virus 1 NOT DETECTED NOT DETECTED Final   Parainfluenza Virus 2 NOT DETECTED NOT DETECTED Final   Parainfluenza Virus 3 NOT DETECTED NOT DETECTED Final   Parainfluenza Virus 4 NOT DETECTED NOT DETECTED Final   Respiratory Syncytial Virus NOT DETECTED NOT DETECTED Final   Bordetella pertussis NOT DETECTED NOT DETECTED Final   Bordetella Parapertussis NOT DETECTED NOT DETECTED Final   Chlamydophila pneumoniae NOT DETECTED NOT DETECTED Final   Mycoplasma pneumoniae NOT DETECTED NOT DETECTED  Final    Comment: Performed at [facility], [address], [place], [place] [address]  Culture, blood (Routine X 2) w Reflex to ID Panel     Status: None   Collection Time: 10/17/23 11:05 AM   Specimen: BLOOD  Result Value Ref Range Status   Specimen Description BLOOD BLOOD RIGHT HAND  Final   Special Requests   Final    BOTTLES DRAWN AEROBIC ONLY Blood Culture results may not be optimal due to an inadequate volume of blood received in culture bottles   Culture   Final    NO GROWTH 5 DAYS Performed at [facility], [address], [place], [place] [address]    Report Status 10/22/2023 FINAL  Final         Radiology Studies: No results found.      Scheduled Meds:  apixaban   5 mg Oral BID   vitamin C   500 mg Oral BID   atorvastatin   20 mg Oral Daily   Chlorhexidine  Gluconate Cloth  6 each Topical Daily   famotidine   20 mg Oral Daily   insulin  aspart  0-5 Units Subcutaneous QHS   insulin  aspart  0-9 Units Subcutaneous TID WC   levETIRAcetam   500 mg Oral BID   lisinopril   10 mg Oral Daily   multivitamin with minerals  1 tablet Oral Daily   QUEtiapine   25-50 mg Oral QHS   traZODone   50 mg Oral QHS   Continuous Infusions:  anticoagulant sodium citrate        LOS: 11 days      [name], MD [facility] Pager [phone]  If 7PM-7AM, please contact night-coverage [email or web address]  10/27/2023, 9:21 AM

## 2023-10-27 NOTE — TOC Progression Note (Addendum)
 Transition of Care [facility]) - Progression Note    Patient Details  Name: Amaris Delafuente MRN: [id number] Date of Birth: 1945-08-14  Transition of Care [facility]) CM/SW Contact  [name], RN Phone Number: 10/27/2023, 5:19 PM  Clinical Narrative:     Noted accepting SNF, [facility]. CM secure message to Tammy, Admissions, [facility] regarding bed availability and CM initiating auth. Per Tammy, SNF extending bed offer and CM can initiate auth.   CM alert to [employer], CMA and Rojelio RAMAN, CMA regarding initiating SNF auth.   Expected Discharge Plan: Skilled Nursing Facility Barriers to Discharge: Continued Medical Work up  Expected Discharge Plan and Services    SNF   Post Acute Care Choice: Durable Medical Equipment Living arrangements for the past 2 months: Single Family Home     Social Drivers of Health (SDOH) Interventions SDOH Screenings   Food Insecurity: No Food Insecurity (10/17/2023)  Housing: Low Risk  (10/17/2023)  Transportation Needs: No Transportation Needs (10/17/2023)  Utilities: Not At Risk (10/17/2023)  Financial Resource Strain: Low Risk  (09/25/2020)   Received from [facility]  Social Connections: Moderately Isolated (10/17/2023)  Tobacco Use: Low Risk  (10/16/2023)    Readmission Risk Interventions    10/17/2023    3:58 PM  Readmission Risk Prevention Plan  PCP or Specialist Appt within 3-5 Days Complete  Social Work Consult for Recovery Care Planning/Counseling Complete  Palliative Care Screening Not Applicable

## 2023-10-27 NOTE — Plan of Care (Signed)

## 2023-10-27 NOTE — Progress Notes (Signed)
 Physical Therapy Treatment Patient Details Name: Traci Carter MRN: [id number] DOB: Dec 14, 1945 Today's Date: 10/27/2023   History of Present Illness Traci Carter is a 78yoF who comes to [facility] on 10/16/23 c AMS, diarrhea, blood in stool, emesis x2 days. Frequent falls recently at home. PMH: PAF, PPM s/p HB, HTN, CVA, DM2. Pt admitted with circulatory shock 2/2 UTI and AKI. Pt requires ICU level care and transferred to 1a from ICU on 10/21/23.    PT Comments  Pt in recliner on arrival, awake, alert, social, agreeable to PT. More cognitively appropriate to place and situation, but still impaired considerably with basic things in session. Pt is unable to follow cues for some exercises, but does better with others more familiar. Pt does well with transfers practice, but then perseverates on this activity minutes later when cuing to do a different motor task. Pt left up in chair at end of session, all needs met. RN made aware of need for new purewick. Will continue to follow.    If plan is discharge home, recommend the following: Two people to help with walking and/or transfers;Two people to help with bathing/dressing/bathroom   Can travel by private vehicle     No  Equipment Recommendations  None recommended by PT    Recommendations for Other Services       Precautions / Restrictions Precautions Precautions: Fall Recall of Precautions/Restrictions: Impaired Restrictions Weight Bearing Restrictions Per Provider Order: No     Mobility  Bed Mobility                    Transfers Overall transfer level: Needs assistance Equipment used: None (bed rail) Transfers: Sit to/from Stand Sit to Stand: Min assist, From elevated surface           General transfer comment: recliner + pillows, using elevated bed rail for a pull to stand    Ambulation/Gait                   Stairs             Wheelchair Mobility     Tilt Bed    Modified Rankin (Stroke  Patients Only)       Balance                                            Communication    Cognition Arousal: Alert Behavior During Therapy: WFL for tasks assessed/performed   PT - Cognitive impairments: No apparent impairments                                Cueing    Exercises General Exercises - Lower Extremity Long Arc Quad: AROM, Both, 10 reps, Seated Hip Flexion/Marching: AROM, Seated, Both, AAROM, 10 reps, Limitations Hip Flexion/Marching Limitations: unable to cognitively grasp this exercise and continued to perform LAQ despite cues, ultimately needs heavy physical facilitation Other Exercises Other Exercises: STS from recliner + pillow: elevated bed rail for anterior pul to standing: 8x with minA at pelvis (longest standing time ~15seconds)    General Comments        Pertinent Vitals/Pain Pain Assessment Pain Assessment: No/denies pain    Home Living  Prior Function            PT Goals (current goals can now be found in the care plan section) Acute Rehab PT Goals PT Goal Formulation: Patient unable to participate in goal setting    Frequency    Min 2X/week      PT Plan      Co-evaluation              AM-PAC PT 6 Clicks Mobility   Outcome Measure  Help needed turning from your back to your side while in a flat bed without using bedrails?: Total Help needed moving from lying on your back to sitting on the side of a flat bed without using bedrails?: Total Help needed moving to and from a bed to a chair (including a wheelchair)?: A Lot Help needed standing up from a chair using your arms (e.g., wheelchair or bedside chair)?: A Lot Help needed to walk in hospital room?: A Lot Help needed climbing 3-5 steps with a railing? : Total 6 Click Score: 9    End of Session   Activity Tolerance: Patient tolerated treatment well Patient left: with call bell/phone within reach;in  chair;with chair alarm set Nurse Communication: Mobility status PT Visit Diagnosis: Other abnormalities of gait and mobility (R26.89);Unsteadiness on feet (R26.81);Muscle weakness (generalized) (M62.81);Dizziness and giddiness (R42)     Time: 1538-1600 PT Time Calculation (min) (ACUTE ONLY): 22 min  Charges:    $Therapeutic Exercise: 8-22 mins PT General Charges $$ ACUTE PT VISIT: 1 Visit                    4:08 PM, 10/27/23 Peggye JAYSON Linear, PT, DPT Physical Therapist - [facility]  [phone] (ASCOM)    Breia Ocampo C 10/27/2023, 4:05 PM

## 2023-10-28 DIAGNOSIS — R4182 Altered mental status, unspecified: Secondary | ICD-10-CM | POA: Diagnosis not present

## 2023-10-28 LAB — CBC
HCT: 26.5 % — ABNORMAL LOW (ref 36.0–46.0)
Hemoglobin: 8.8 g/dL — ABNORMAL LOW (ref 12.0–15.0)
MCH: 31.8 pg (ref 26.0–34.0)
MCHC: 33.2 g/dL (ref 30.0–36.0)
MCV: 95.7 fL (ref 80.0–100.0)
Platelets: 275 K/uL (ref 150–400)
RBC: 2.77 MIL/uL — ABNORMAL LOW (ref 3.87–5.11)
RDW: 14.2 % (ref 11.5–15.5)
WBC: 10.1 K/uL (ref 4.0–10.5)
nRBC: 0 % (ref 0.0–0.2)

## 2023-10-28 LAB — GLUCOSE, CAPILLARY
Glucose-Capillary: 173 mg/dL — ABNORMAL HIGH (ref 70–99)
Glucose-Capillary: 220 mg/dL — ABNORMAL HIGH (ref 70–99)
Glucose-Capillary: 247 mg/dL — ABNORMAL HIGH (ref 70–99)
Glucose-Capillary: 213 mg/dL — ABNORMAL HIGH (ref 70–99)

## 2023-10-28 LAB — BASIC METABOLIC PANEL WITH GFR
Anion gap: 9 (ref 5–15)
BUN: 28 mg/dL — ABNORMAL HIGH (ref 8–23)
CO2: 25 mmol/L (ref 22–32)
Calcium: 9 mg/dL (ref 8.9–10.3)
Chloride: 107 mmol/L (ref 98–111)
Creatinine, Ser: 1.31 mg/dL — ABNORMAL HIGH (ref 0.44–1.00)
GFR, Estimated: 42 mL/min — ABNORMAL LOW (ref >60)
Glucose, Bld: 170 mg/dL — ABNORMAL HIGH (ref 70–99)
Potassium: 3.8 mmol/L (ref 3.5–5.1)
Sodium: 141 mmol/L (ref 135–145)

## 2023-10-28 NOTE — Plan of Care (Signed)

## 2023-10-28 NOTE — Plan of Care (Signed)
   Problem: Nutrition: Goal: Adequate nutrition will be maintained Outcome: Progressing   Problem: Coping: Goal: Level of anxiety will decrease Outcome: Progressing   Problem: Safety: Goal: Ability to remain free from injury will improve Outcome: Progressing

## 2023-10-28 NOTE — Progress Notes (Signed)
 PROGRESS NOTE    Traci Carter  FMW:[id number] DOB: [birth year] DOA: 10/16/2023 PCP: Traci Honor [name], MD   Assessment & Plan:   Principal Problem:   Altered mental status Active Problems:   Acute renal failure with tubular necrosis (HCC)   Hypovolemic shock (HCC)   Severe sepsis with septic shock (HCC)   Metabolic acidosis  Assessment and Plan: Septic shock : present on admission likely secondary to UTI and probable intrabdominal source,  diarrhea. Completed abx course. Resolved   AKI: as per nephro. Cr is labile. Did have CRRT for a short period. No further indication that HD is needed as per nephro. Nephro signed off on 10/26/23   Acute metabolic acidosis: resolved  Hyperkalemia: resolved   Hypokalemia:  WNL today    Hypomagnesemia: will re-check in AM    PAF: continue on eliquis   Hx of complete heart block: s/p dual chamber pacemaker  HTN: continue on home dose of lisinopril     Hx of seizures: continue on home dose of keppra   DM2: well controlled, HbA1c 5.7. Continue on SSI w/ accuchecks  Possible dementia: continue w/ supportive care. No formal dx of dementia    Generalized weakness: PT/OT recs SNF. Recent multiple falls  OSA: not on CPAP   Hx of Asthma/COPD: unknown stage and/or severity. Bronchodilators prn   Obesity: BMI 37.0. Would benefit from weight loss     DVT prophylaxis: eliquis  Code Status: Full Family Communication:  Disposition Plan: waiting on insurance auth   Status is: Inpatient Remains inpatient appropriate because: medically stable. Waiting on insurance auth     Level of care: Med-Surg Consultants:  Nephro   Procedures:   Antimicrobials:    Subjective: Pt c/o malaise.  Objective: Vitals:   10/27/23 2007 10/28/23 0440 10/28/23 0441 10/28/23 0854  BP: 137/62  124/64 (!) 112/50  Pulse: 76  83 75  Resp: 16  16 17   Temp: 98.9 F (37.2 C)  98.4 F (36.9 C) 98 F (36.7 C)  TempSrc: Oral     SpO2: 98%  95% 96%   Weight:  97.8 kg    Height:        Intake/Output Summary (Last 24 hours) at 10/28/2023 0900 Last data filed at 10/27/2023 1900 Gross per 24 hour  Intake 0 ml  Output 500 ml  Net -500 ml    Filed Weights   10/26/23 0500 10/27/23 0500 10/28/23 0440  Weight: 97.5 kg 97 kg 97.8 kg    Examination:  General exam: appears calm & comfortable Respiratory system: clear breath sounds b/l  Cardiovascular system: S1/S2+. No rubs or clicks  Gastrointestinal system: abd is soft, NT, obese & normal bowel sounds   Central nervous system: alert & awake. Moves all extremities  Psychiatry: judgement and insight appears at baseline. Flat mood and affect    Data Reviewed: I have personally reviewed following labs and imaging studies  CBC: Recent Labs  Lab 10/22/23 0515 10/23/23 0637 10/26/23 0422 10/27/23 0349 10/28/23 0422  WBC 10.4 10.6* 11.1* 10.5 10.1  HGB 9.7* 10.5* 9.4* 9.3* 8.8*  HCT 28.8* 31.7* 27.6* 28.5* 26.5*  MCV 92.9 94.3 94.8 95.3 95.7  PLT 121* 139* 245 292 275   Basic Metabolic Panel: Recent Labs  Lab 10/22/23 0515 10/23/23 0637 10/24/23 0458 10/25/23 0424 10/25/23 1911 10/26/23 0422 10/27/23 0349 10/28/23 0422  NA 141 141 141 140 139 142 143 141  K 3.3* 3.5 3.7 3.7 3.8 3.7 3.5 3.8  CL 104 101 103 104 101 105 107  107  CO2 27 26 26 27 24 28 28 25   GLUCOSE 232* 224* 295* 301* 309* 205* 166* 170*  BUN 37* 35* 33* 33* 30* 27* 28* 28*  CREATININE 2.44* 2.27* 1.92* 1.55* 1.56* 1.51* 1.27* 1.31*  CALCIUM  8.6* 9.1 8.8* 8.8* 9.3 8.9 9.2 9.0  MG 1.9 1.6* 1.7 1.6*  --   --   --   --    GFR: Estimated Creatinine Clearance: 40.7 mL/min (A) (by C-G formula based on SCr of 1.31 mg/dL (H)). Liver Function Tests: Recent Labs  Lab 10/23/23 0637  AST 14*  ALT 13  ALKPHOS 74  BILITOT 0.9  PROT 6.3*  ALBUMIN  3.3*   No results for input(s): LIPASE, AMYLASE in the last 168 hours. No results for input(s): AMMONIA in the last 168 hours. Coagulation Profile: No  results for input(s): INR, PROTIME in the last 168 hours. Cardiac Enzymes: No results for input(s): CKTOTAL, CKMB, CKMBINDEX, TROPONINI in the last 168 hours. BNP (last 3 results) No results for input(s): PROBNP in the last 8760 hours. HbA1C: No results for input(s): HGBA1C in the last 72 hours. CBG: Recent Labs  Lab 10/27/23 0731 10/27/23 1127 10/27/23 1605 10/27/23 2132 10/28/23 0801  GLUCAP 157* 198* 343* 221* 173*   Lipid Profile: No results for input(s): CHOL, HDL, LDLCALC, TRIG, CHOLHDL, LDLDIRECT in the last 72 hours. Thyroid Function Tests: No results for input(s): TSH, T4TOTAL, FREET4, T3FREE, THYROIDAB in the last 72 hours. Anemia Panel: No results for input(s): VITAMINB12, FOLATE, FERRITIN, TIBC, IRON, RETICCTPCT in the last 72 hours. Sepsis Labs: No results for input(s): PROCALCITON, LATICACIDVEN in the last 168 hours.  No results found for this or any previous visit (from the past 240 hours).        Radiology Studies: No results found.      Scheduled Meds:  apixaban   5 mg Oral BID   vitamin C   500 mg Oral BID   atorvastatin   20 mg Oral Daily   Chlorhexidine  Gluconate Cloth  6 each Topical Daily   famotidine   20 mg Oral Daily   insulin  aspart  0-5 Units Subcutaneous QHS   insulin  aspart  0-9 Units Subcutaneous TID WC   levETIRAcetam   500 mg Oral BID   lisinopril   10 mg Oral Daily   multivitamin with minerals  1 tablet Oral Daily   QUEtiapine   25-50 mg Oral QHS   traZODone   50 mg Oral QHS   Continuous Infusions:  anticoagulant sodium citrate        LOS: 12 days      [name], MD [facility] Pager [phone]  If 7PM-7AM, please contact night-coverage [email or web address]  10/28/2023, 9:00 AM

## 2023-10-29 DIAGNOSIS — R4182 Altered mental status, unspecified: Secondary | ICD-10-CM | POA: Diagnosis not present

## 2023-10-29 LAB — GLUCOSE, CAPILLARY
Glucose-Capillary: 168 mg/dL — ABNORMAL HIGH (ref 70–99)
Glucose-Capillary: 203 mg/dL — ABNORMAL HIGH (ref 70–99)
Glucose-Capillary: 225 mg/dL — ABNORMAL HIGH (ref 70–99)
Glucose-Capillary: 176 mg/dL — ABNORMAL HIGH (ref 70–99)

## 2023-10-29 LAB — BASIC METABOLIC PANEL WITH GFR
Anion gap: 11 (ref 5–15)
BUN: 22 mg/dL (ref 8–23)
CO2: 25 mmol/L (ref 22–32)
Calcium: 8.9 mg/dL (ref 8.9–10.3)
Chloride: 105 mmol/L (ref 98–111)
Creatinine, Ser: 1.36 mg/dL — ABNORMAL HIGH (ref 0.44–1.00)
GFR, Estimated: 40 mL/min — ABNORMAL LOW (ref >60)
Glucose, Bld: 180 mg/dL — ABNORMAL HIGH (ref 70–99)
Potassium: 4.1 mmol/L (ref 3.5–5.1)
Sodium: 141 mmol/L (ref 135–145)

## 2023-10-29 LAB — CBC
HCT: 28.3 % — ABNORMAL LOW (ref 36.0–46.0)
Hemoglobin: 9.4 g/dL — ABNORMAL LOW (ref 12.0–15.0)
MCH: 31.8 pg (ref 26.0–34.0)
MCHC: 33.2 g/dL (ref 30.0–36.0)
MCV: 95.6 fL (ref 80.0–100.0)
Platelets: 307 K/uL (ref 150–400)
RBC: 2.96 MIL/uL — ABNORMAL LOW (ref 3.87–5.11)
RDW: 14.2 % (ref 11.5–15.5)
WBC: 11.3 K/uL — ABNORMAL HIGH (ref 4.0–10.5)
nRBC: 0 % (ref 0.0–0.2)

## 2023-10-29 LAB — MAGNESIUM: Magnesium: 1.6 mg/dL — ABNORMAL LOW (ref 1.7–2.4)

## 2023-10-29 MED ORDER — MAGNESIUM SULFATE 2 GM/50ML IV SOLN
2.0000 g | Freq: Once | INTRAVENOUS | Status: AC
  Administered 2023-10-29: 2 g via INTRAVENOUS
  Filled 2023-10-29: qty 50

## 2023-10-29 NOTE — Plan of Care (Signed)

## 2023-10-29 NOTE — Progress Notes (Signed)
 PROGRESS NOTE    Chrys Landgrebe  FMW:[id number] DOB: 08-30-1945 DOA: 10/16/2023 PCP: Corlis Honor [name], MD   Assessment & Plan:   Principal Problem:   Altered mental status Active Problems:   Acute renal failure with tubular necrosis (HCC)   Hypovolemic shock (HCC)   Severe sepsis with septic shock (HCC)   Metabolic acidosis  Assessment and Plan: Septic shock : present on admission likely secondary to UTI and probable intrabdominal source,  diarrhea. Completed abx course. Resolved   AKI: as per nephro. Cr is labile. Did have CRRT for a short period. No further indication that HD is needed as per nephro. Nephro signed off on 10/26/23   Acute metabolic acidosis: resolved  Hyperkalemia: resolved   Hypokalemia: WNL today     Hypomagnesemia: mg sulfate given    PAF: continue on eliquis    Hx of complete heart block: s/p dual chamber pacemaker  HTN: continue on home dose of lisinopril     Hx of seizures: continue on home dose of keppra    DM2: well controlled, HbA1c 5.7. Continue on SSI w/ accuchecks   Possible dementia: continue w/ supportive care. No formal dx of dementia    Generalized weakness: PT/OT recs SNF. Recent multiple falls  OSA: not on CPAP   Hx of Asthma/COPD: unknown stage and/or severity. Bronchodilators prn   Obesity: BMI 37.0. Would benefit from weight loss     DVT prophylaxis: eliquis  Code Status: Full Family Communication:  Disposition Plan: medically stable waiting on insurance auth   Status is: Inpatient Remains inpatient appropriate because: medically stable. Still waiting on insurance auth     Level of care: Med-Surg Consultants:  Nephro   Procedures:   Antimicrobials:    Subjective: Pt c/o fatigue.  Objective: Vitals:   10/28/23 1636 10/28/23 1934 10/29/23 0359 10/29/23 0758  BP: 131/76 (!) 110/31 (!) 140/76 123/79  Pulse: 87 73 79 81  Resp: 18 16 16 17   Temp: 98.7 F (37.1 C) 98.3 F (36.8 C) 97.9 F (36.6 C) 98.3 F  (36.8 C)  TempSrc:    Oral  SpO2: 96% 96% 97% 95%  Weight:      Height:        Intake/Output Summary (Last 24 hours) at 10/29/2023 0847 Last data filed at 10/28/2023 1700 Gross per 24 hour  Intake 480 ml  Output 300 ml  Net 180 ml    Filed Weights   10/26/23 0500 10/27/23 0500 10/28/23 0440  Weight: 97.5 kg 97 kg 97.8 kg    Examination:  General exam: appears comfortable  Respiratory system: clear breath sounds b/l   Cardiovascular system: S1 & S2+. No rubs or clicks   Gastrointestinal system: abd is soft, NT, obese & hypoactive bowel sounds Central nervous system: alert & awake. Moves all extremities  Psychiatry: judgement and insight appears at baseline. Flat mood and affect    Data Reviewed: I have personally reviewed following labs and imaging studies  CBC: Recent Labs  Lab 10/23/23 0637 10/26/23 0422 10/27/23 0349 10/28/23 0422 10/29/23 0719  WBC 10.6* 11.1* 10.5 10.1 11.3*  HGB 10.5* 9.4* 9.3* 8.8* 9.4*  HCT 31.7* 27.6* 28.5* 26.5* 28.3*  MCV 94.3 94.8 95.3 95.7 95.6  PLT 139* 245 292 275 307   Basic Metabolic Panel: Recent Labs  Lab 10/23/23 0637 10/24/23 0458 10/25/23 0424 10/25/23 1911 10/26/23 0422 10/27/23 0349 10/28/23 0422 10/29/23 0719  NA 141 141 140 139 142 143 141 141  K 3.5 3.7 3.7 3.8 3.7 3.5 3.8 4.1  CL 101 103 104 101 105 107 107 105  CO2 26 26 27 24 28 28 25 25   GLUCOSE 224* 295* 301* 309* 205* 166* 170* 180*  BUN 35* 33* 33* 30* 27* 28* 28* 22  CREATININE 2.27* 1.92* 1.55* 1.56* 1.51* 1.27* 1.31* 1.36*  CALCIUM  9.1 8.8* 8.8* 9.3 8.9 9.2 9.0 8.9  MG 1.6* 1.7 1.6*  --   --   --   --  1.6*   GFR: Estimated Creatinine Clearance: 39.2 mL/min (A) (by C-G formula based on SCr of 1.36 mg/dL (H)). Liver Function Tests: Recent Labs  Lab 10/23/23 0637  AST 14*  ALT 13  ALKPHOS 74  BILITOT 0.9  PROT 6.3*  ALBUMIN  3.3*   No results for input(s): LIPASE, AMYLASE in the last 168 hours. No results for input(s): AMMONIA in  the last 168 hours. Coagulation Profile: No results for input(s): INR, PROTIME in the last 168 hours. Cardiac Enzymes: No results for input(s): CKTOTAL, CKMB, CKMBINDEX, TROPONINI in the last 168 hours. BNP (last 3 results) No results for input(s): PROBNP in the last 8760 hours. HbA1C: No results for input(s): HGBA1C in the last 72 hours. CBG: Recent Labs  Lab 10/28/23 0801 10/28/23 1155 10/28/23 1642 10/28/23 2047 10/29/23 0746  GLUCAP 173* 220* 247* 213* 168*   Lipid Profile: No results for input(s): CHOL, HDL, LDLCALC, TRIG, CHOLHDL, LDLDIRECT in the last 72 hours. Thyroid Function Tests: No results for input(s): TSH, T4TOTAL, FREET4, T3FREE, THYROIDAB in the last 72 hours. Anemia Panel: No results for input(s): VITAMINB12, FOLATE, FERRITIN, TIBC, IRON, RETICCTPCT in the last 72 hours. Sepsis Labs: No results for input(s): PROCALCITON, LATICACIDVEN in the last 168 hours.  No results found for this or any previous visit (from the past 240 hours).        Radiology Studies: No results found.      Scheduled Meds:  apixaban   5 mg Oral BID   vitamin C   500 mg Oral BID   atorvastatin   20 mg Oral Daily   Chlorhexidine  Gluconate Cloth  6 each Topical Daily   famotidine   20 mg Oral Daily   insulin  aspart  0-5 Units Subcutaneous QHS   insulin  aspart  0-9 Units Subcutaneous TID WC   levETIRAcetam   500 mg Oral BID   lisinopril   10 mg Oral Daily   multivitamin with minerals  1 tablet Oral Daily   QUEtiapine   25-50 mg Oral QHS   traZODone   50 mg Oral QHS   Continuous Infusions:  anticoagulant sodium citrate      magnesium  sulfate bolus IVPB       LOS: 13 days      [name], MD [facility] Pager [phone]  If 7PM-7AM, please contact night-coverage [email or web address]  10/29/2023, 8:47 AM

## 2023-10-30 DIAGNOSIS — R4182 Altered mental status, unspecified: Secondary | ICD-10-CM | POA: Diagnosis not present

## 2023-10-30 LAB — CBC
HCT: 27.3 % — ABNORMAL LOW (ref 36.0–46.0)
Hemoglobin: 9.1 g/dL — ABNORMAL LOW (ref 12.0–15.0)
MCH: 31.8 pg (ref 26.0–34.0)
MCHC: 33.3 g/dL (ref 30.0–36.0)
MCV: 95.5 fL (ref 80.0–100.0)
Platelets: 290 K/uL (ref 150–400)
RBC: 2.86 MIL/uL — ABNORMAL LOW (ref 3.87–5.11)
RDW: 14.1 % (ref 11.5–15.5)
WBC: 10.7 K/uL — ABNORMAL HIGH (ref 4.0–10.5)
nRBC: 0 % (ref 0.0–0.2)

## 2023-10-30 LAB — GLUCOSE, CAPILLARY
Glucose-Capillary: 172 mg/dL — ABNORMAL HIGH (ref 70–99)
Glucose-Capillary: 214 mg/dL — ABNORMAL HIGH (ref 70–99)

## 2023-10-30 LAB — MAGNESIUM: Magnesium: 1.9 mg/dL (ref 1.7–2.4)

## 2023-10-30 LAB — BASIC METABOLIC PANEL WITH GFR
Anion gap: 12 (ref 5–15)
BUN: 19 mg/dL (ref 8–23)
CO2: 25 mmol/L (ref 22–32)
Calcium: 9.1 mg/dL (ref 8.9–10.3)
Chloride: 107 mmol/L (ref 98–111)
Creatinine, Ser: 1.31 mg/dL — ABNORMAL HIGH (ref 0.44–1.00)
GFR, Estimated: 42 mL/min — ABNORMAL LOW (ref >60)
Glucose, Bld: 189 mg/dL — ABNORMAL HIGH (ref 70–99)
Potassium: 4.3 mmol/L (ref 3.5–5.1)
Sodium: 144 mmol/L (ref 135–145)

## 2023-10-30 MED ORDER — ALPRAZOLAM 0.5 MG PO TABS: 0.5000 mg | ORAL_TABLET | Freq: Every evening | ORAL | 0 refills | Status: AC | PRN

## 2023-10-30 NOTE — Discharge Summary (Addendum)
 Physician Discharge Summary  Traci Carter FMW:[id number] DOB: 07/31/1945 DOA: 10/16/2023  PCP: Corlis Honor [name], MD  Admit date: 10/16/2023 Discharge date: 10/30/2023  Admitted From: home Disposition:  SNF  Recommendations for Outpatient Follow-up:  Follow up with PCP in 1-2 weeks F/u w/ nephro, Dr. Dennise, in 1-2 weeks  Home Health: no  Equipment/Devices:  Discharge Condition: stable CODE STATUS: full  Diet recommendation:  Regular  Brief/Interim Summary: HPI was taken from NP Ouma: 78 y.o female with significant PMHx of Atrial Fibrillation on Eliquis , Dual-chamber pacemaker for complete heart block 12/04/2003, DM2, CKD3, HLD, CAD, HTN, ICH (2012), CVA, GERD, Seizures, Fibromyalgia, Depression/anxiety, Gout, OSA not on CPAP, E. Coli and Enterococcus faecalis UTI who presented to the ED with chief complaints of altered mental status.    Per the ED report, the patient's husband is currently admitted to the hospital and was transitioned to comfort care on 10/15/23. The patient's daughter states the patient has continued to decline and has not been eating, drinking, or taking her medications. Her condition worsened over the past two days, with nausea, vomiting, and diarrhea. The patient was noted with worsening confusion and generalized weakness today, which prompted further evaluation in the ED.   ED Course: Initial vital signs showed HR of 68 beats/minute, BP 109/50 mm Hg, the RR 18 breaths/minute, and the oxygen saturation 95 % on RA and a temperature of 97.49F (36.4C). Pertinent Labs/Diagnostics Findings: Na+/ K+:142/5.7 Glucose: 28, CO2 10, BUN/Cr.:91/8.32 anion gap 26 WBC: 11.8 K/L  COVID PCR: Negative,  troponin:53   BNP: 559.3  CXR>neg CTH>neg CT Abd/pelvis>neg  As per Dr. Awanda: 78 y.o female with significant PMHx of Atrial Fibrillation on Eliquis , Dual-chamber pacemaker for complete heart block 12/04/2003, DM2, CKD3, CAD, HTN, ICH (2012), CVA, Seizures, Fibromyalgia,  Depression/anxiety, OSA not on CPAP, E. Coli and Enterococcus faecalis UTI who presented to the ED with chief complaints of altered mental status.    Admitted to ICU for pressor and CRRT.  Transferred to [facility] on 10/21/23.  As per Dr. Trudy 7/23-7/28/25: Pt's Cr continued to improve and nephro signed off on 10/26/23. PT/OT evaluated pt and recommended SNF.   Discharge Diagnoses:  Principal Problem:   Altered mental status Active Problems:   Acute renal failure with tubular necrosis (HCC)   Hypovolemic shock (HCC)   Severe sepsis with septic shock (HCC)   Metabolic acidosis Septic shock : present on admission likely secondary to UTI and probable intrabdominal source,  diarrhea. Completed abx course. Resolved   AKI: as per nephro. Cr is labile. Did have CRRT for a short period. No further indication that HD is needed as per nephro. Nephro signed off on 10/26/23   Acute metabolic acidosis: resolved  Hyperkalemia: resolved   Hypokalemia: WNL today     Hypomagnesemia: mg sulfate given    PAF: continue on eliquis    Hx of complete heart block: s/p dual chamber pacemaker  HTN: continue on home dose of lisinopril     Hx of seizures: continue on home dose of keppra    DM2: well controlled, HbA1c 5.7. Continue on SSI w/ accuchecks   Possible dementia: continue w/ supportive care. No formal dx of dementia    Generalized weakness: PT/OT recs SNF. Recent multiple falls  OSA: not on CPAP   Hx of Asthma/COPD: unknown stage and/or severity. Bronchodilators prn   Obesity: BMI 37.0. Would benefit from weight loss    Discharge Instructions  Discharge Instructions     Diet general   Complete by: As  directed    Discharge instructions   Complete by: As directed    F/u w/ PCP in 1-2 weeks. F/u w/ nephro, Dr. Dennise, in 1-2 weeks   Increase activity slowly   Complete by: As directed    No wound care   Complete by: As directed       Allergies as of 10/30/2023       Reactions    Codeine Other (See Comments)   GI Distress GI Distress GI Distress   Codeine    Digoxin    Other reaction(s): Unknown   Lactose Intolerance (gi)    Lanoxin [digoxin]    Penicillins    Zoloft [sertraline Hcl]    Penicillins Rash, Other (See Comments)        Medication List     TAKE these medications    acetaminophen  325 MG tablet Commonly known as: TYLENOL  Take 2 tablets (650 mg total) by mouth every 6 (six) hours as needed for mild pain, headache, fever or moderate pain.   albuterol  108 (90 Base) MCG/ACT inhaler Commonly known as: VENTOLIN  HFA Inhale 2 puffs into the lungs every 6 (six) hours as needed for wheezing or shortness of breath.   albuterol  (2.5 MG/3ML) 0.083% nebulizer solution Commonly known as: PROVENTIL  Inhale 3 mLs into the lungs every 4 (four) hours as needed for wheezing or shortness of breath.   allopurinol  100 MG tablet Commonly known as: ZYLOPRIM  Take 100 mg by mouth daily.   ALPRAZolam  0.5 MG tablet Commonly known as: XANAX  Take 1 tablet (0.5 mg total) by mouth at bedtime as needed for up to 2 days for anxiety.   atorvastatin  20 MG tablet Commonly known as: LIPITOR Take 20 mg by mouth daily.   colchicine  0.6 MG tablet Take 1 tablet (0.6 mg total) by mouth daily. What changed:  when to take this additional instructions   Eliquis  5 MG Tabs tablet Generic drug: apixaban  Take 5 mg by mouth 2 (two) times daily.   famotidine  20 MG tablet Commonly known as: PEPCID  Take 20 mg by mouth daily.   furosemide  20 MG tablet Commonly known as: LASIX  Take 1 tablet (20 mg total) by mouth daily as needed (weight gain >3 lbs in 1 day and >5 lbs in 2 days). What changed: when to take this   glipiZIDE  2.5 MG 24 hr tablet Commonly known as: GLUCOTROL  XL Take 2.5 mg by mouth daily.   levETIRAcetam  1000 MG tablet Commonly known as: KEPPRA  Take 1,000 mg by mouth 2 (two) times daily.   lisinopril  2.5 MG tablet Commonly known as: ZESTRIL  Take 2.5 mg  by mouth daily.   metFORMIN  1000 MG tablet Commonly known as: GLUCOPHAGE  Take 1,000 mg by mouth 2 (two) times daily.   pregabalin  150 MG capsule Commonly known as: LYRICA  Take 150 mg by mouth 2 (two) times daily.   QUEtiapine  25 MG tablet Commonly known as: SEROQUEL  Take 25-50 mg by mouth at bedtime.        Contact information for after-discharge care     Destination     [facility] [place], [place]. [place]   Service: Skilled Nursing Contact information: [address] [place]  [phone]                    Allergies  Allergen Reactions   Codeine Other (See Comments)    GI Distress GI Distress GI Distress   Codeine    Digoxin     Other reaction(s): Unknown  Lactose Intolerance (Gi)    Lanoxin [Digoxin]    Penicillins    Zoloft [Sertraline Hcl]    Penicillins Rash and Other (See Comments)    Consultations: ICU nephro   Procedures/Studies: US  RENAL Result Date: 10/17/2023 CLINICAL DATA:  Acute renal failure. EXAM: RENAL / URINARY TRACT ULTRASOUND COMPLETE COMPARISON:  November 11, 2004 FINDINGS: Right Kidney: Renal measurements: 9.0 cm x 4.6 cm x 4.8 cm = volume: 103.2 mL. Echogenicity within normal limits. No mass or hydronephrosis visualized. Left Kidney: Renal measurements: 9.4 cm x 4.4 cm x 4.6 cm = volume: 97.6 mL. Mildly increased echogenicity of the renal parenchyma is seen. No mass or hydronephrosis visualized. Bladder: A Foley catheter is in place. Other: None. IMPRESSION: Mildly echogenic left kidney which may represent a normal variant versus sequelae associated with medical renal disease. Electronically Signed   By: Suzen Dials M.D.   On: 10/17/2023 15:52   DG Chest Port 1 View Result Date: 10/17/2023 CLINICAL DATA:  Central line placement EXAM: PORTABLE CHEST 1 VIEW COMPARISON:  Same day chest radiograph and prior studies FINDINGS: Interval placement of right IJ approach central venous catheter with catheter in place  with tip at the cavoatrial junction/upper right atrium. Unchanged left IJ approach central venous catheter. Unchanged cardiac pacemaker. Low lung volumes. No pleural effusions. No pneumothorax. Unchanged cardiomediastinal silhouette. IMPRESSION: Interval placement of right IJ approach central venous catheter with tip at the cavoatrial junction/upper right atrium. Unchanged left IJ approach central venous catheter. No pneumothorax. Electronically Signed   By: Michaeline Blanch M.D.   On: 10/17/2023 12:37   DG Chest Port 1 View Result Date: 10/17/2023 CLINICAL DATA:  Central line placement EXAM: PORTABLE CHEST 1 VIEW COMPARISON:  Yesterday FINDINGS: Placement of a left IJ line with tip at low SVC. Patient rotated left. Midline trachea. Mild cardiomegaly. Atherosclerosis in the transverse aorta. No pleural effusion or pneumothorax. No congestive failure. Clear lungs. IMPRESSION: Left IJ line tip at low SVC, without pneumothorax. Cardiomegaly without congestive failure. Aortic Atherosclerosis (ICD10-I70.0). Electronically Signed   By: Rockey Kilts M.D.   On: 10/17/2023 08:48   CT Head Wo Contrast Result Date: 10/16/2023 CLINICAL DATA:  Mental status change, unknown cause EXAM: CT HEAD WITHOUT CONTRAST TECHNIQUE: Contiguous axial images were obtained from the base of the skull through the vertex without intravenous contrast. RADIATION DOSE REDUCTION: This exam was performed according to the departmental dose-optimization program which includes automated exposure control, adjustment of the mA and/or kV according to patient size and/or use of iterative reconstruction technique. COMPARISON:  CT head 09/16/2020 FINDINGS: Brain: Patchy and confluent areas of decreased attenuation are noted throughout the deep and periventricular white matter of the cerebral hemispheres bilaterally, compatible with chronic microvascular ischemic disease. No evidence of large-territorial acute infarction. No parenchymal hemorrhage. No mass  lesion. No extra-axial collection. No mass effect or midline shift. No hydrocephalus. Basilar cisterns are patent. Vascular: No hyperdense vessel. Atherosclerotic calcifications are present within the cavernous internal carotid arteries. Skull: No acute fracture or focal lesion. Sinuses/Orbits: Paranasal sinuses and mastoid air cells are clear. The orbits are unremarkable. Other: None. IMPRESSION: No acute intracranial abnormality. Electronically Signed   By: Morgane  Naveau M.D.   On: 10/16/2023 18:56   CT ABDOMEN PELVIS WO CONTRAST Result Date: 10/16/2023 CLINICAL DATA:  LLQ abdominal pain also with vaginal bleeding EXAM: CT ABDOMEN AND PELVIS WITHOUT CONTRAST TECHNIQUE: Multidetector CT imaging of the abdomen and pelvis was performed following the standard protocol without IV contrast. RADIATION DOSE REDUCTION: This  exam was performed according to the departmental dose-optimization program which includes automated exposure control, adjustment of the mA and/or kV according to patient size and/or use of iterative reconstruction technique. COMPARISON:  None Available. FINDINGS: Lower chest: Bilateral lower lobe linear atelectasis versus scarring. Coronary artery calcification. Cardiac leads partially visualized. Hepatobiliary: No focal liver abnormality. Status post cholecystectomy. No biliary dilatation. Pancreas: Diffusely atrophic. No focal lesion. Otherwise normal pancreatic contour. No surrounding inflammatory changes. No main pancreatic ductal dilatation. Spleen: Normal in size without focal abnormality. Adrenals/Urinary Tract: No adrenal nodule bilaterally. Punctate left renal calcification. No hydronephrosis. No definite contour-deforming renal mass. No ureterolithiasis or hydroureter. The urinary bladder is unremarkable. Stomach/Bowel: Stomach is within normal limits. No evidence of bowel wall thickening or dilatation. Slightly hypertrophic ileocecal valve. Appendix appears normal. Vascular/Lymphatic: No  abdominal aorta or iliac aneurysm. Severe atherosclerotic plaque of the aorta and its branches. No abdominal, pelvic, or inguinal lymphadenopathy. Reproductive: Status post hysterectomy. No adnexal masses. Other: No intraperitoneal free fluid. No intraperitoneal free gas. No organized fluid collection. Musculoskeletal: No abdominal wall hernia or abnormality. No suspicious lytic or blastic osseous lesions. No acute displaced fracture. Multilevel degenerative changes of the spine. IMPRESSION: 1. No acute intra-abdominal or intrapelvic abnormality with limited evaluation on this noncontrast study. 2. Punctate nonobstructive left nephrolithiasis. 3. Status post cholecystectomy and hysterectomy. 4.  Aortic Atherosclerosis (ICD10-I70.0). Electronically Signed   By: Morgane  Naveau M.D.   On: 10/16/2023 18:54   DG Chest Portable 1 View Result Date: 10/16/2023 CLINICAL DATA:  Chest pain. EXAM: PORTABLE CHEST 1 VIEW COMPARISON:  February 09, 2021. FINDINGS: Stable cardiomediastinal silhouette. Left-sided pacemaker is unchanged. Both lungs are clear. The visualized skeletal structures are unremarkable. IMPRESSION: No active disease. Electronically Signed   By: Lynwood Landy Raddle M.D.   On: 10/16/2023 15:25   (Echo, Carotid, EGD, Colonoscopy, ERCP)    Subjective: Pt c/o fatigue   Discharge Exam: Vitals:   10/30/23 0432 10/30/23 0810  BP: (!) 140/79 (!) 146/69  Pulse: 76 68  Resp: 18 18  Temp: 98.6 F (37 C) 98.2 F (36.8 C)  SpO2: 94% 98%   Vitals:   10/29/23 2006 10/30/23 0432 10/30/23 0433 10/30/23 0810  BP: (!) 142/91 (!) 140/79  (!) 146/69  Pulse: (!) 101 76  68  Resp: 18 18  18   Temp: 99.2 F (37.3 C) 98.6 F (37 C)  98.2 F (36.8 C)  TempSrc: Oral Oral  Oral  SpO2: 97% 94%  98%  Weight:   96.2 kg   Height:        General: Pt is alert, awake, not in acute distress Cardiovascular: S1/S2 +, no rubs, no gallops Respiratory: CTA bilaterally, no wheezing, no rhonchi Abdominal: Soft,  NT,obese, bowel sounds + Extremities:  no cyanosis    The results of significant diagnostics from this hospitalization (including imaging, microbiology, ancillary and laboratory) are listed below for reference.     Microbiology: No results found for this or any previous visit (from the past 240 hours).   Labs: BNP (last 3 results) Recent Labs    10/16/23 1520  BNP 559.3*   Basic Metabolic Panel: Recent Labs  Lab 10/24/23 0458 10/25/23 0424 10/25/23 1911 10/26/23 0422 10/27/23 0349 10/28/23 0422 10/29/23 0719 10/30/23 0852  NA 141 140   < > 142 143 141 141 144  K 3.7 3.7   < > 3.7 3.5 3.8 4.1 4.3  CL 103 104   < > 105 107 107 105 107  CO2 26 27   < >  28 28 25 25 25   GLUCOSE 295* 301*   < > 205* 166* 170* 180* 189*  BUN 33* 33*   < > 27* 28* 28* 22 19  CREATININE 1.92* 1.55*   < > 1.51* 1.27* 1.31* 1.36* 1.31*  CALCIUM  8.8* 8.8*   < > 8.9 9.2 9.0 8.9 9.1  MG 1.7 1.6*  --   --   --   --  1.6* 1.9   < > = values in this interval not displayed.   Liver Function Tests: No results for input(s): AST, ALT, ALKPHOS, BILITOT, PROT, ALBUMIN  in the last 168 hours. No results for input(s): LIPASE, AMYLASE in the last 168 hours. No results for input(s): AMMONIA in the last 168 hours. CBC: Recent Labs  Lab 10/26/23 0422 10/27/23 0349 10/28/23 0422 10/29/23 0719 10/30/23 0852  WBC 11.1* 10.5 10.1 11.3* 10.7*  HGB 9.4* 9.3* 8.8* 9.4* 9.1*  HCT 27.6* 28.5* 26.5* 28.3* 27.3*  MCV 94.8 95.3 95.7 95.6 95.5  PLT 245 292 275 307 290   Cardiac Enzymes: No results for input(s): CKTOTAL, CKMB, CKMBINDEX, TROPONINI in the last 168 hours. BNP: Invalid input(s): POCBNP CBG: Recent Labs  Lab 10/29/23 0746 10/29/23 1207 10/29/23 1632 10/29/23 2109 10/30/23 0917  GLUCAP 168* 203* 225* 176* 172*   D-Dimer No results for input(s): DDIMER in the last 72 hours. Hgb A1c No results for input(s): HGBA1C in the last 72 hours. Lipid Profile No  results for input(s): CHOL, HDL, LDLCALC, TRIG, CHOLHDL, LDLDIRECT in the last 72 hours. Thyroid function studies No results for input(s): TSH, T4TOTAL, T3FREE, THYROIDAB in the last 72 hours.  Invalid input(s): FREET3 Anemia work up No results for input(s): VITAMINB12, FOLATE, FERRITIN, TIBC, IRON, RETICCTPCT in the last 72 hours. Urinalysis    Component Value Date/Time   COLORURINE YELLOW (A) 10/16/2023 2204   APPEARANCEUR CLOUDY (A) 10/16/2023 2204   APPEARANCEUR Clear 07/21/2012 1003   LABSPEC 1.010 10/16/2023 2204   LABSPEC 1.010 07/21/2012 1003   PHURINE 5.0 10/16/2023 2204   GLUCOSEU NEGATIVE 10/16/2023 2204   GLUCOSEU 50 mg/dL 95/80/7985 8996   HGBUR SMALL (A) 10/16/2023 2204   BILIRUBINUR NEGATIVE 10/16/2023 2204   BILIRUBINUR Negative 07/21/2012 1003   KETONESUR NEGATIVE 10/16/2023 2204   PROTEINUR NEGATIVE 10/16/2023 2204   NITRITE NEGATIVE 10/16/2023 2204   LEUKOCYTESUR LARGE (A) 10/16/2023 2204   LEUKOCYTESUR Negative 07/21/2012 1003   Sepsis Labs Recent Labs  Lab 10/27/23 0349 10/28/23 0422 10/29/23 0719 10/30/23 0852  WBC 10.5 10.1 11.3* 10.7*   Microbiology No results found for this or any previous visit (from the past 240 hours).   Time coordinating discharge: 35 minutes  SIGNED:   [name], MD  [facility] 10/30/2023, 10:20 AM Pager   If 7PM-7AM, please contact night-coverage [email or web address]

## 2023-10-30 NOTE — TOC Progression Note (Signed)
 Transition of Care [facility]) - Progression Note    Patient Details  Name: Traci Carter MRN: [id number] Date of Birth: 10-Jun-1945  Transition of Care [facility]) CM/SW Contact  [name], RN Phone Number: 10/30/2023, 9:16 AM  Clinical Narrative:     Alert from Rojelio RAMAN, CMA regarding auth status, SNF approved 10/29/23-11/01/23 plan auth id #[id number] next review date: 11/01/23 . Patient selected to attend [facility]. CM alert to Tammy, Admissions, [facility] [place] regarding possible admission today.   Expected Discharge Plan: Skilled Nursing Facility Barriers to Discharge: Continued Medical Work up  Expected Discharge Plan and Services    SNF Post Acute Care Choice: Durable Medical Equipment Living arrangements for the past 2 months: Single Family Home                   Social Drivers of Health (SDOH) Interventions SDOH Screenings   Food Insecurity: No Food Insecurity (10/17/2023)  Housing: Low Risk  (10/17/2023)  Transportation Needs: No Transportation Needs (10/17/2023)  Utilities: Not At Risk (10/17/2023)  Financial Resource Strain: Low Risk  (09/25/2020)   Received from [facility]  Social Connections: Moderately Isolated (10/17/2023)  Tobacco Use: Low Risk  (10/16/2023)    Readmission Risk Interventions    10/17/2023    3:58 PM  Readmission Risk Prevention Plan  PCP or Specialist Appt within 3-5 Days Complete  Social Work Consult for Recovery Care Planning/Counseling Complete  Palliative Care Screening Not Applicable

## 2023-10-30 NOTE — Progress Notes (Signed)
 Report was called to [facility] given to nurse Charleen, patient is waiting transport by [facility]  at 11:00, will continue to monitor until discharge.

## 2023-10-30 NOTE — TOC Transition Note (Addendum)
 Transition of Care [facility]) - Discharge Note   Patient Details  Name: Traci Carter MRN: [id number] Date of Birth: [birth year]  Transition of Care [facility]) CM/SW Contact:  [name], RN 10/30/2023, 9:48 AM   Clinical Narrative:     Patient to discharge to [facility] today via Geographical information systems officer at [employer]. CM awaiting discharge summary for SNF.  Discharge summary, discharge orders faxed to SNF.   Final next level of care: Skilled Nursing Facility Barriers to Discharge: No Barriers Identified   Patient Goals and CMS Choice  SNF   Discharge Placement  SNF      Discharge Plan and Services Additional resources added to the After Visit Summary for       Post Acute Care Choice: Durable Medical Equipment            Social Drivers of Health (SDOH) Interventions SDOH Screenings   Food Insecurity: No Food Insecurity (10/17/2023)  Housing: Low Risk  (10/17/2023)  Transportation Needs: No Transportation Needs (10/17/2023)  Utilities: Not At Risk (10/17/2023)  Financial Resource Strain: Low Risk  (09/25/2020)   Received from [facility]  Social Connections: Moderately Isolated (10/17/2023)  Tobacco Use: Low Risk  (10/16/2023)     Readmission Risk Interventions    10/17/2023    3:58 PM  Readmission Risk Prevention Plan  PCP or Specialist Appt within 3-5 Days Complete  Social Work Consult for Recovery Care Planning/Counseling Complete  Palliative Care Screening Not Applicable

## 2023-10-30 NOTE — Plan of Care (Signed)

## 2023-11-20 ENCOUNTER — Inpatient Hospital Stay
Admission: EM | Admit: 2023-11-20 | Discharge: 2023-12-01 | DRG: 871 | Disposition: A | Discharge status: Skilled Nursing Facility | Source: Skilled Nursing Facility | Attending: Internal Medicine | Admitting: Internal Medicine

## 2023-11-20 ENCOUNTER — Emergency Department

## 2023-11-20 ENCOUNTER — Other Ambulatory Visit: Payer: Self-pay

## 2023-11-20 DIAGNOSIS — I110 Hypertensive heart disease with heart failure: Secondary | ICD-10-CM | POA: Diagnosis present

## 2023-11-20 DIAGNOSIS — E870 Hyperosmolality and hypernatremia: Secondary | ICD-10-CM | POA: Diagnosis present

## 2023-11-20 DIAGNOSIS — D75839 Thrombocytosis, unspecified: Secondary | ICD-10-CM | POA: Diagnosis present

## 2023-11-20 DIAGNOSIS — J9601 Acute respiratory failure with hypoxia: Secondary | ICD-10-CM | POA: Diagnosis present

## 2023-11-20 DIAGNOSIS — F32A Depression, unspecified: Secondary | ICD-10-CM | POA: Diagnosis present

## 2023-11-20 DIAGNOSIS — M797 Fibromyalgia: Secondary | ICD-10-CM | POA: Diagnosis present

## 2023-11-20 DIAGNOSIS — E785 Hyperlipidemia, unspecified: Secondary | ICD-10-CM | POA: Diagnosis present

## 2023-11-20 DIAGNOSIS — Z7984 Long term (current) use of oral hypoglycemic drugs: Secondary | ICD-10-CM

## 2023-11-20 DIAGNOSIS — K219 Gastro-esophageal reflux disease without esophagitis: Secondary | ICD-10-CM | POA: Diagnosis present

## 2023-11-20 DIAGNOSIS — M109 Gout, unspecified: Secondary | ICD-10-CM | POA: Diagnosis present

## 2023-11-20 DIAGNOSIS — J189 Pneumonia, unspecified organism: Secondary | ICD-10-CM

## 2023-11-20 DIAGNOSIS — E119 Type 2 diabetes mellitus without complications: Secondary | ICD-10-CM | POA: Diagnosis not present

## 2023-11-20 DIAGNOSIS — Z6838 Body mass index (BMI) 38.0-38.9, adult: Secondary | ICD-10-CM

## 2023-11-20 DIAGNOSIS — F0393 Unspecified dementia, unspecified severity, with mood disturbance: Secondary | ICD-10-CM | POA: Diagnosis present

## 2023-11-20 DIAGNOSIS — Z885 Allergy status to narcotic agent status: Secondary | ICD-10-CM

## 2023-11-20 DIAGNOSIS — E1169 Type 2 diabetes mellitus with other specified complication: Secondary | ICD-10-CM | POA: Diagnosis present

## 2023-11-20 DIAGNOSIS — Z803 Family history of malignant neoplasm of breast: Secondary | ICD-10-CM

## 2023-11-20 DIAGNOSIS — Z888 Allergy status to other drugs, medicaments and biological substances status: Secondary | ICD-10-CM

## 2023-11-20 DIAGNOSIS — I480 Paroxysmal atrial fibrillation: Secondary | ICD-10-CM | POA: Diagnosis present

## 2023-11-20 DIAGNOSIS — A400 Sepsis due to streptococcus, group A: Secondary | ICD-10-CM | POA: Diagnosis not present

## 2023-11-20 DIAGNOSIS — J123 Human metapneumovirus pneumonia: Secondary | ICD-10-CM | POA: Diagnosis present

## 2023-11-20 DIAGNOSIS — E739 Lactose intolerance, unspecified: Secondary | ICD-10-CM | POA: Diagnosis present

## 2023-11-20 DIAGNOSIS — Z950 Presence of cardiac pacemaker: Secondary | ICD-10-CM

## 2023-11-20 DIAGNOSIS — E669 Obesity, unspecified: Secondary | ICD-10-CM | POA: Diagnosis present

## 2023-11-20 DIAGNOSIS — N390 Urinary tract infection, site not specified: Secondary | ICD-10-CM | POA: Diagnosis present

## 2023-11-20 DIAGNOSIS — G9341 Metabolic encephalopathy: Secondary | ICD-10-CM | POA: Diagnosis present

## 2023-11-20 DIAGNOSIS — R6520 Severe sepsis without septic shock: Secondary | ICD-10-CM | POA: Diagnosis not present

## 2023-11-20 DIAGNOSIS — I10 Essential (primary) hypertension: Secondary | ICD-10-CM | POA: Diagnosis not present

## 2023-11-20 DIAGNOSIS — A419 Sepsis, unspecified organism: Secondary | ICD-10-CM | POA: Diagnosis present

## 2023-11-20 DIAGNOSIS — Z7901 Long term (current) use of anticoagulants: Secondary | ICD-10-CM | POA: Diagnosis not present

## 2023-11-20 DIAGNOSIS — E876 Hypokalemia: Secondary | ICD-10-CM | POA: Diagnosis present

## 2023-11-20 DIAGNOSIS — Z880 Allergy status to penicillin: Secondary | ICD-10-CM

## 2023-11-20 DIAGNOSIS — G40909 Epilepsy, unspecified, not intractable, without status epilepticus: Secondary | ICD-10-CM | POA: Diagnosis present

## 2023-11-20 DIAGNOSIS — Z79899 Other long term (current) drug therapy: Secondary | ICD-10-CM

## 2023-11-20 DIAGNOSIS — B961 Klebsiella pneumoniae [K. pneumoniae] as the cause of diseases classified elsewhere: Secondary | ICD-10-CM | POA: Diagnosis present

## 2023-11-20 DIAGNOSIS — R5381 Other malaise: Secondary | ICD-10-CM | POA: Diagnosis present

## 2023-11-20 DIAGNOSIS — Z8673 Personal history of transient ischemic attack (TIA), and cerebral infarction without residual deficits: Secondary | ICD-10-CM

## 2023-11-20 DIAGNOSIS — Z90710 Acquired absence of both cervix and uterus: Secondary | ICD-10-CM

## 2023-11-20 DIAGNOSIS — D696 Thrombocytopenia, unspecified: Secondary | ICD-10-CM | POA: Diagnosis present

## 2023-11-20 DIAGNOSIS — G9340 Encephalopathy, unspecified: Secondary | ICD-10-CM | POA: Diagnosis not present

## 2023-11-20 DIAGNOSIS — I509 Heart failure, unspecified: Secondary | ICD-10-CM | POA: Diagnosis present

## 2023-11-20 DIAGNOSIS — G4733 Obstructive sleep apnea (adult) (pediatric): Secondary | ICD-10-CM | POA: Diagnosis present

## 2023-11-20 LAB — TROPONIN I (HIGH SENSITIVITY)
Troponin I (High Sensitivity): 90 ng/L — ABNORMAL HIGH (ref <18)
Troponin I (High Sensitivity): 88 ng/L — ABNORMAL HIGH (ref <18)

## 2023-11-20 LAB — COMPREHENSIVE METABOLIC PANEL WITH GFR
ALT: 13 U/L (ref 0–44)
AST: 25 U/L (ref 15–41)
Albumin: 2.8 g/dL — ABNORMAL LOW (ref 3.5–5.0)
Alkaline Phosphatase: 71 U/L (ref 38–126)
Anion gap: 17 — ABNORMAL HIGH (ref 5–15)
BUN: 35 mg/dL — ABNORMAL HIGH (ref 8–23)
CO2: 26 mmol/L (ref 22–32)
Calcium: 8.5 mg/dL — ABNORMAL LOW (ref 8.9–10.3)
Chloride: 100 mmol/L (ref 98–111)
Creatinine, Ser: 1.7 mg/dL — ABNORMAL HIGH (ref 0.44–1.00)
GFR, Estimated: 31 mL/min — ABNORMAL LOW (ref >60)
Glucose, Bld: 150 mg/dL — ABNORMAL HIGH (ref 70–99)
Potassium: 3.4 mmol/L — ABNORMAL LOW (ref 3.5–5.1)
Sodium: 143 mmol/L (ref 135–145)
Total Bilirubin: 0.6 mg/dL (ref 0.0–1.2)
Total Protein: 6.2 g/dL — ABNORMAL LOW (ref 6.5–8.1)

## 2023-11-20 LAB — URINALYSIS, W/ REFLEX TO CULTURE (INFECTION SUSPECTED)
Bilirubin Urine: NEGATIVE
Glucose, UA: NEGATIVE mg/dL
Hgb urine dipstick: NEGATIVE
Ketones, ur: NEGATIVE mg/dL
Nitrite: NEGATIVE
Protein, ur: NEGATIVE mg/dL
Specific Gravity, Urine: 1.013 (ref 1.005–1.030)
pH: 5 (ref 5.0–8.0)

## 2023-11-20 LAB — CBC WITH DIFFERENTIAL/PLATELET
Abs Immature Granulocytes: 0.03 K/uL (ref 0.00–0.07)
Basophils Absolute: 0 K/uL (ref 0.0–0.1)
Basophils Relative: 0 %
Eosinophils Absolute: 0.1 K/uL (ref 0.0–0.5)
Eosinophils Relative: 2 %
HCT: 29.5 % — ABNORMAL LOW (ref 36.0–46.0)
Hemoglobin: 9.6 g/dL — ABNORMAL LOW (ref 12.0–15.0)
Immature Granulocytes: 1 %
Lymphocytes Relative: 24 %
Lymphs Abs: 1.3 K/uL (ref 0.7–4.0)
MCH: 30.7 pg (ref 26.0–34.0)
MCHC: 32.5 g/dL (ref 30.0–36.0)
MCV: 94.2 fL (ref 80.0–100.0)
Monocytes Absolute: 0.6 K/uL (ref 0.1–1.0)
Monocytes Relative: 11 %
Neutro Abs: 3.5 K/uL (ref 1.7–7.7)
Neutrophils Relative %: 62 %
Platelets: 134 K/uL — ABNORMAL LOW (ref 150–400)
RBC: 3.13 MIL/uL — ABNORMAL LOW (ref 3.87–5.11)
RDW: 13.7 % (ref 11.5–15.5)
WBC: 5.6 K/uL (ref 4.0–10.5)
nRBC: 0 % (ref 0.0–0.2)

## 2023-11-20 LAB — BLOOD GAS, VENOUS

## 2023-11-20 LAB — LACTIC ACID, PLASMA
Lactic Acid, Venous: 2.3 mmol/L (ref 0.5–1.9)
Lactic Acid, Venous: 1.4 mmol/L (ref 0.5–1.9)

## 2023-11-20 LAB — PROTIME-INR
INR: 1.9 — ABNORMAL HIGH (ref 0.8–1.2)
Prothrombin Time: 22.7 s — ABNORMAL HIGH (ref 11.4–15.2)

## 2023-11-20 LAB — RESP PANEL BY RT-PCR (RSV, FLU A&B, COVID)  RVPGX2
Influenza A by PCR: NEGATIVE
Influenza B by PCR: NEGATIVE
Resp Syncytial Virus by PCR: NEGATIVE
SARS Coronavirus 2 by RT PCR: NEGATIVE

## 2023-11-20 LAB — BRAIN NATRIURETIC PEPTIDE: B Natriuretic Peptide: 212 pg/mL — ABNORMAL HIGH (ref 0.0–100.0)

## 2023-11-20 MED ORDER — ENOXAPARIN SODIUM 30 MG/0.3ML IJ SOSY: 30.0000 mg | PREFILLED_SYRINGE | INTRAMUSCULAR | Status: DC

## 2023-11-20 MED ORDER — SODIUM CHLORIDE 0.9 % IV BOLUS
1000.0000 mL | Freq: Once | INTRAVENOUS | Status: AC
  Administered 2023-11-20: 1000 mL via INTRAVENOUS

## 2023-11-20 MED ORDER — SODIUM CHLORIDE 0.9 % IV SOLN: 2.0000 g | INTRAVENOUS | Status: DC

## 2023-11-20 MED ORDER — LEVETIRACETAM 500 MG PO TABS
1000.0000 mg | ORAL_TABLET | Freq: Two times a day (BID) | ORAL | Status: DC
  Administered 2023-11-21 – 2023-11-26 (×12): 1000 mg via ORAL
  Filled 2023-11-20 (×14): qty 2

## 2023-11-20 MED ORDER — VANCOMYCIN HCL IN DEXTROSE 1-5 GM/200ML-% IV SOLN
1000.0000 mg | Freq: Once | INTRAVENOUS | Status: AC
  Administered 2023-11-20: 1000 mg via INTRAVENOUS
  Filled 2023-11-20: qty 200

## 2023-11-20 MED ORDER — ONDANSETRON HCL 4 MG/2ML IJ SOLN
4.0000 mg | Freq: Four times a day (QID) | INTRAMUSCULAR | Status: DC | PRN
Start: 2023-11-20 — End: 2023-12-01

## 2023-11-20 MED ORDER — LISINOPRIL 5 MG PO TABS
2.5000 mg | ORAL_TABLET | Freq: Every day | ORAL | Status: DC
  Administered 2023-11-21 – 2023-11-26 (×6): 2.5 mg via ORAL
  Filled 2023-11-20 (×8): qty 1

## 2023-11-20 MED ORDER — ATORVASTATIN CALCIUM 20 MG PO TABS
20.0000 mg | ORAL_TABLET | Freq: Every day | ORAL | Status: DC
  Administered 2023-11-21 – 2023-12-01 (×10): 20 mg via ORAL
  Filled 2023-11-20 (×12): qty 1

## 2023-11-20 MED ORDER — METRONIDAZOLE 500 MG/100ML IV SOLN
500.0000 mg | Freq: Once | INTRAVENOUS | Status: AC
  Administered 2023-11-20: 500 mg via INTRAVENOUS
  Filled 2023-11-20: qty 100

## 2023-11-20 MED ORDER — ALBUTEROL SULFATE (2.5 MG/3ML) 0.083% IN NEBU: 3.0000 mL | INHALATION_SOLUTION | RESPIRATORY_TRACT | Status: DC | PRN

## 2023-11-20 MED ORDER — SODIUM CHLORIDE 0.9 % IV SOLN
2.0000 g | INTRAVENOUS | Status: DC
  Administered 2023-11-21: 2 g via INTRAVENOUS
  Filled 2023-11-20: qty 12.5

## 2023-11-20 MED ORDER — SODIUM CHLORIDE 0.9 % IV SOLN
2.0000 g | Freq: Once | INTRAVENOUS | Status: AC
  Administered 2023-11-20: 2 g via INTRAVENOUS
  Filled 2023-11-20: qty 12.5

## 2023-11-20 MED ORDER — POLYETHYLENE GLYCOL 3350 17 G PO PACK: 17.0000 g | PACK | Freq: Every day | ORAL | Status: DC | PRN

## 2023-11-20 MED ORDER — APIXABAN 5 MG PO TABS
5.0000 mg | ORAL_TABLET | Freq: Two times a day (BID) | ORAL | Status: DC
  Administered 2023-11-21 – 2023-12-01 (×20): 5 mg via ORAL
  Filled 2023-11-20 (×22): qty 1

## 2023-11-20 MED ORDER — ACETAMINOPHEN 325 MG RE SUPP
650.0000 mg | Freq: Once | RECTAL | Status: AC
  Administered 2023-11-20: 650 mg via RECTAL
  Filled 2023-11-20: qty 2

## 2023-11-20 MED ORDER — SODIUM CHLORIDE 0.9 % IV SOLN
100.0000 mg | Freq: Once | INTRAVENOUS | Status: AC
  Administered 2023-11-20: 100 mg via INTRAVENOUS
  Filled 2023-11-20: qty 100

## 2023-11-20 MED ORDER — VANCOMYCIN HCL 750 MG/150ML IV SOLN
750.0000 mg | Freq: Once | INTRAVENOUS | Status: AC
  Administered 2023-11-20: 750 mg via INTRAVENOUS
  Filled 2023-11-20 (×2): qty 150

## 2023-11-20 MED ORDER — VANCOMYCIN HCL 1250 MG/250ML IV SOLN: 1250.0000 mg | INTRAVENOUS | Status: DC

## 2023-11-20 MED ORDER — SODIUM CHLORIDE 0.9 % IV SOLN
500.0000 mg | INTRAVENOUS | Status: DC
  Administered 2023-11-20: 500 mg via INTRAVENOUS
  Filled 2023-11-20 (×2): qty 5

## 2023-11-20 MED ORDER — ACETAMINOPHEN 650 MG RE SUPP: 650.0000 mg | Freq: Four times a day (QID) | RECTAL | Status: DC | PRN

## 2023-11-20 MED ORDER — FUROSEMIDE 10 MG/ML IJ SOLN
20.0000 mg | Freq: Two times a day (BID) | INTRAMUSCULAR | Status: DC
  Administered 2023-11-20 – 2023-11-24 (×9): 20 mg via INTRAVENOUS
  Filled 2023-11-20 (×9): qty 2

## 2023-11-20 MED ORDER — ACETAMINOPHEN 325 MG PO TABS
650.0000 mg | ORAL_TABLET | Freq: Four times a day (QID) | ORAL | Status: DC | PRN
  Administered 2023-11-21 – 2023-11-30 (×2): 650 mg via ORAL
  Filled 2023-11-20 (×2): qty 2

## 2023-11-20 MED ORDER — ONDANSETRON HCL 4 MG PO TABS: 4.0000 mg | ORAL_TABLET | Freq: Four times a day (QID) | ORAL | Status: DC | PRN

## 2023-11-20 MED ORDER — DOXYCYCLINE HYCLATE 100 MG PO TABS: 100.0000 mg | ORAL_TABLET | Freq: Once | ORAL | Status: DC

## 2023-11-20 NOTE — H&P (Signed)
 History and Physical    Traci Carter FMW:[id number] DOB: 05/05/1945 DOA: 11/20/2023  DOS: the patient was seen and examined on 11/20/2023  PCP: Corlis Honor [name], MD   Patient coming from: SNF  I have personally briefly reviewed patient's old medical records in [facility] Link  Chief Complaint: Fever  HPI: Traci Carter is a pleasant 78 y.o. female with medical history significant for A-fib on Eliquis , DM, dual-chamber pacemaker, ICH, GERD, seizure, anxiety/depression, fibromyalgia who was brought into ED from a nursing facility for evaluation of fevers.  Patient was recently admitted and discharged from hospital on 10/16/2023, required pressors secondary to urinary tract infection.  Patient has dementia and not able to provide any meaningful history. Per EMS patient has been altered for the last couple of days and they had to put her on oxygen.  She normally does not take oxygen at baseline.  According to family member they thought she had CHF and they started her on Lasix .  According to family she had not been normal for couple of days.  Unclear why it took so long for the patient to be transported here.  Today was the first day she had a fever.  ED Course: Upon arrival to the ED, patient is found to have fever at 102.2, tachycardic around 112 and tachypneic around 20 saturating 89%.  Checks x-ray did not show any focal pneumonia rather it shows mild congestive changes.  She was given cefepime , doxycycline , Flagyl  and vancomycin  in the emergency room.  Cultures were sent.  Hospitalist service was consulted for evaluation for admission for possible pneumonia.  Review of Systems:  ROS  All other systems negative except as noted in the HPI.  Past Medical History:  Diagnosis Date   Afib [facility])    Atrial fibrillation (HCC)    Diabetes mellitus without complication (HCC)    DM (diabetes mellitus) (HCC)    Dysrhythmia    HTN (hypertension)    Hypertension    Stroke (HCC) 07/21/12    posterior superior left frontal lobe parenchymal hemorrhage     Past Surgical History:  Procedure Laterality Date   ABDOMINAL HYSTERECTOMY     CARPAL TUNNEL RELEASE     COLONOSCOPY  01/02/2009   Dr Dessa diverticulosis.   COLONOSCOPY WITH PROPOFOL  N/A 06/28/2017   Procedure: COLONOSCOPY WITH PROPOFOL ;  Surgeon: Dessa Reyes ORN, MD;  Location: [facility] ENDOSCOPY;  Service: Endoscopy;  Laterality: N/A;   ELBOW SURGERY     INSERT / REPLACE / REMOVE PACEMAKER     Dr Ammon   JOINT REPLACEMENT  2005   knee   SHOULDER ARTHROSCOPY       reports that she has never smoked. She has never used smokeless tobacco. She reports that she does not drink alcohol and does not use drugs.  Allergies  Allergen Reactions   Codeine Other (See Comments)    GI Distress GI Distress GI Distress   Codeine    Digoxin     Other reaction(s): Unknown   Lactose Intolerance (Gi)    Lanoxin [Digoxin]    Penicillins    Zoloft [Sertraline Hcl]    Penicillins Rash and Other (See Comments)    Family History  Problem Relation Age of Onset   Breast cancer Mother    Prostate cancer Father     Prior to Admission medications   Medication Sig Start Date End Date Taking? Authorizing Provider  acetaminophen  (TYLENOL ) 325 MG tablet Take 2 tablets (650 mg total) by mouth every 6 (  six) hours as needed for mild pain, headache, fever or moderate pain. 09/21/20  Yes Elgergawy, Brayton RAMAN, MD  albuterol  (PROVENTIL ) (2.5 MG/3ML) 0.083% nebulizer solution Inhale 3 mLs into the lungs every 4 (four) hours as needed for wheezing or shortness of breath. 09/21/20  Yes Elgergawy, Brayton RAMAN, MD  albuterol  (VENTOLIN  HFA) 108 (90 Base) MCG/ACT inhaler Inhale 2 puffs into the lungs every 6 (six) hours as needed for wheezing or shortness of breath. 08/12/20  Yes [provider]  allopurinol  (ZYLOPRIM ) 100 MG tablet Take 100 mg by mouth daily. 11/28/20  Yes [provider]  ALPRAZolam  (NIRAVAM ) 0.25 MG dissolvable  tablet Take 0.25 mg by mouth at bedtime as needed for anxiety.   Yes [provider]  atorvastatin  (LIPITOR) 20 MG tablet Take 20 mg by mouth daily.   Yes [provider]  cetirizine (ZYRTEC) 10 MG tablet Take 10 mg by mouth daily.   Yes [provider]  colchicine  0.6 MG tablet Take 1 tablet (0.6 mg total) by mouth daily. Patient taking differently: Take 0.6 mg by mouth 2 (two) times daily. Take while under gout flare 02/11/21  Yes Jens Durand, MD  ELIQUIS  5 MG TABS tablet Take 5 mg by mouth 2 (two) times daily. 01/07/21  Yes [provider]  famotidine  (PEPCID ) 20 MG tablet Take 20 mg by mouth daily.   Yes [provider]  furosemide  (LASIX ) 20 MG tablet Take 1 tablet (20 mg total) by mouth daily as needed (weight gain >3 lbs in 1 day and >5 lbs in 2 days). Patient taking differently: Take 20 mg by mouth daily. 10/20/20  Yes Tobie Yetta HERO, MD  glipiZIDE  (GLUCOTROL  XL) 2.5 MG 24 hr tablet Take 2.5 mg by mouth daily. 01/04/21  Yes [provider]  guaiFENesin  (MUCINEX ) 600 MG 12 hr tablet Take 600 mg by mouth 2 (two) times daily.   Yes [provider]  hydrOXYzine  (ATARAX ) 25 MG tablet Take 25 mg by mouth every 8 (eight) hours as needed for anxiety.   Yes [provider]  ipratropium-albuterol  (DUONEB) 0.5-2.5 (3) MG/3ML SOLN Take 3 mLs by nebulization every 6 (six) hours as needed.   Yes [provider]  levETIRAcetam  (KEPPRA ) 1000 MG tablet Take 1,000 mg by mouth 2 (two) times daily. 01/08/21  Yes [provider]  lisinopril  (ZESTRIL ) 2.5 MG tablet Take 2.5 mg by mouth daily.   Yes [provider]  magnesium  oxide (MAG-OX) 400 MG tablet Take 400 mg by mouth daily.   Yes [provider]  metFORMIN  (GLUCOPHAGE ) 1000 MG tablet Take 1,000 mg by mouth 2 (two) times daily. 11/28/20  Yes [provider]  naloxone (NARCAN) nasal spray 4 mg/0.1 mL Place 1 spray into the nose 3 (three) times  daily as needed.   Yes [provider]  nystatin powder Apply 1 Application topically 2 (two) times daily.   Yes [provider]  pregabalin  (LYRICA ) 150 MG capsule Take 150 mg by mouth 2 (two) times daily.   Yes [provider]  QUEtiapine  (SEROQUEL ) 25 MG tablet Take 25-50 mg by mouth at bedtime.   Yes [provider]    Physical Exam: Vitals:   11/20/23 1300 11/20/23 1330 11/20/23 1353 11/20/23 1400  BP: (!) 93/53 105/71  106/60  Pulse: 88 94  83  Resp: 12 19  (!) 24  Temp:   97.6 F (36.4 C)   TempSrc:   Oral   SpO2: 100% 100%  100%  Weight:        Physical Exam   Constitutional: Alert, awake, calm, comfortable HEENT: Neck supple Respiratory: Clear to auscultation B/L, no wheezing, no rales.  Cardiovascular: Regular rate and rhythm, no murmurs / rubs / gallops. No extremity edema. 2+ pedal pulses. No carotid bruits.  Abdomen: Soft, no tenderness, Bowel sounds positive.  Musculoskeletal: no clubbing / cyanosis. Good ROM, no contractures. Normal muscle tone.  Skin: no rashes, lesions, ulcers. Neurologic: CN 2-12 grossly intact. Sensation intact, No focal deficit identified Psychiatric: Alert and confused x 3. Normal mood.    Labs on Admission: I have personally reviewed following labs and imaging studies  CBC: Recent Labs  Lab 11/20/23 0850  WBC 5.6  NEUTROABS 3.5  HGB 9.6*  HCT 29.5*  MCV 94.2  PLT 134*   Basic Metabolic Panel: Recent Labs  Lab 11/20/23 0850  NA 143  K 3.4*  CL 100  CO2 26  GLUCOSE 150*  BUN 35*  CREATININE 1.70*  CALCIUM  8.5*   GFR: Estimated Creatinine Clearance: 28.5 mL/min (A) (by C-G formula based on SCr of 1.7 mg/dL (H)). Liver Function Tests: Recent Labs  Lab 11/20/23 0850  AST 25  ALT 13  ALKPHOS 71  BILITOT 0.6  PROT 6.2*  ALBUMIN  2.8*   No results for input(s): LIPASE, AMYLASE in the last 168 hours. No results for input(s): AMMONIA in the last 168 hours. Coagulation  Profile: Recent Labs  Lab 11/20/23 0850  INR 1.9*   Cardiac Enzymes: Recent Labs  Lab 11/20/23 0850 11/20/23 1027  TROPONINIHS 90* 88*   BNP (last 3 results) Recent Labs    10/16/23 1520 11/20/23 0850  BNP 559.3* 212.0*   HbA1C: No results for input(s): HGBA1C in the last 72 hours. CBG: No results for input(s): GLUCAP in the last 168 hours. Lipid Profile: No results for input(s): CHOL, HDL, LDLCALC, TRIG, CHOLHDL, LDLDIRECT in the last 72 hours. Thyroid Function Tests: No results for input(s): TSH, T4TOTAL, FREET4, T3FREE, THYROIDAB in the last 72 hours. Anemia Panel: No results for input(s): VITAMINB12, FOLATE, FERRITIN, TIBC, IRON, RETICCTPCT in the last 72 hours. Urine analysis:    Component Value Date/Time   COLORURINE YELLOW (A) 11/20/2023 0821   APPEARANCEUR HAZY (A) 11/20/2023 0821   APPEARANCEUR Clear 07/21/2012 1003   LABSPEC 1.013 11/20/2023 0821   LABSPEC 1.010 07/21/2012 1003   PHURINE 5.0 11/20/2023 0821   GLUCOSEU NEGATIVE 11/20/2023 0821   GLUCOSEU 50 mg/dL 95/80/7985 8996   HGBUR NEGATIVE 11/20/2023 0821   BILIRUBINUR NEGATIVE 11/20/2023 0821   BILIRUBINUR Negative 07/21/2012 1003   KETONESUR NEGATIVE 11/20/2023 0821   PROTEINUR NEGATIVE 11/20/2023 0821   NITRITE NEGATIVE 11/20/2023 0821   LEUKOCYTESUR SMALL (A) 11/20/2023 0821   LEUKOCYTESUR Negative 07/21/2012 1003    Radiological Exams on Admission: I have personally reviewed images CT Renal Stone Study Result Date: 11/20/2023 CLINICAL DATA:  78 year old female with fever, altered mental status, possible sepsis. Abdomen and flank pain. EXAM: CT ABDOMEN AND PELVIS WITHOUT CONTRAST TECHNIQUE: Multidetector CT imaging of the abdomen and pelvis was performed following the standard protocol without IV contrast. RADIATION DOSE REDUCTION: This exam was performed according to the departmental dose-optimization program which includes automated exposure control,  adjustment of the mA and/or kV according to patient size and/or use of iterative reconstruction technique. COMPARISON:  Portable chest x-ray today. Noncontrast CT Abdomen and Pelvis 10/16/2023. FINDINGS: Lower chest: Cardiac pacemaker lead. Stable mild cardiomegaly. No pericardial effusion. Respiratory motion artifact. Similar lung volumes and new from last  month confluent peribronchial and airspace opacity in the lower lobes greater on the left. Left lower lobe air bronchograms. No pleural effusion. Hepatobiliary: Chronic cholecystectomy.  Negative noncontrast liver. Pancreas: Partial atrophy. Spleen: Negative; punctate splenic calcified granuloma or vascular calcifications. Adrenals/Urinary Tract: Normal adrenal glands. Stable kidneys, appear nonobstructed with chronic nonspecific pararenal fat stranding or edema. No change from last month. Punctate nephrolithiasis more apparent on the left, and superimposed renal vascular calcifications. Ureters remain diminutive. Chronic pelvic phleboliths. Negative urinary bladder. Stomach/Bowel: Moderate large bowel redundancy and retained stool. No large bowel inflammation identified, probable previous appendectomy. Nondilated terminal ileum and small bowel. Nondilated stomach and duodenum. No pneumoperitoneum or free fluid. Vascular/Lymphatic: Chronic severe calcified atherosclerosis. The blood pool hypodensity. Normal caliber abdominal aorta. Vascular patency is not evaluated in the absence of IV contrast. No lymphadenopathy. Reproductive: Chronically diminutive or absent. Other: No pelvis free fluid. Musculoskeletal: Advanced spine degeneration. Thoracic spine hyperostosis with some interbody ankylosis. Widespread lumbar vacuum disc is chronic. No acute osseous abnormality identified. IMPRESSION: 1. Left greater than right lower lobe Pneumonia, consider Aspiration. No pleural effusion. 2. No superimposed acute or inflammatory process identified in the noncontrast abdomen  or pelvis; Chronic nephrolithiasis.  Chronic perinephric edema, nonspecific. Blood pool hypodensity, query Anemia. Advanced chronic calcified atherosclerosis, Aortic Atherosclerosis (ICD10-I70.0). Electronically Signed   By: VEAR Hurst M.D.   On: 11/20/2023 09:26   CT Cervical Spine Wo Contrast Result Date: 11/20/2023 CLINICAL DATA:  78 year old female with fever, altered mental status, possible sepsis. Abdomen and flank pain. EXAM: CT CERVICAL SPINE WITHOUT CONTRAST TECHNIQUE: Multidetector CT imaging of the cervical spine was performed without intravenous contrast. Multiplanar CT image reconstructions were also generated. RADIATION DOSE REDUCTION: This exam was performed according to the departmental dose-optimization program which includes automated exposure control, adjustment of the mA and/or kV according to patient size and/or use of iterative reconstruction technique. COMPARISON:  Head CT today.  Portable chest x-ray today 0800 hours. FINDINGS: Alignment: Straightening of cervical lordosis. Cervicothoracic junction alignment is within normal limits. Bilateral posterior element alignment is within normal limits. Skull base and vertebrae: Visualized skull base is intact. No atlanto-occipital dissociation. C1 and C2 appear intact and aligned. No acute osseous abnormality identified. Soft tissues and spinal canal: No prevertebral fluid or swelling. No visible canal hematoma. Bulky calcified cervical carotid atherosclerosis. Disc levels: Bulky degenerative disc osteophyte complex at C4-C5. Probable mild associated spinal stenosis. Upper chest: Left subclavian approach pacemaker type leads. Calcified aortic atherosclerosis. Patchy, confluent opacity partially visible in the right upper lobe series 2, image 85. IMPRESSION: 1. No acute osseous abnormality identified in the cervical spine. 2. Bulky degenerative disc osteophyte complex at C4-C5 with probable mild spinal stenosis. 3. Patchy, confluent opacity partially  visible in the Right upper lobe, nonspecific and might be edema or infection. 4. Calcified carotid and Aortic Atherosclerosis (ICD10-I70.0). Electronically Signed   By: VEAR Hurst M.D.   On: 11/20/2023 09:20   CT HEAD WO CONTRAST ( ) Result Date: 11/20/2023 CLINICAL DATA:  78 year old female with fever, altered mental status, possible sepsis. Abdomen and flank pain. EXAM: CT HEAD WITHOUT CONTRAST TECHNIQUE: Contiguous axial images were obtained from the base of the skull through the vertex without intravenous contrast. RADIATION DOSE REDUCTION: This exam was performed according to the departmental dose-optimization program which includes automated exposure control, adjustment of the mA and/or kV according to patient size and/or use of iterative reconstruction technique. COMPARISON:  Head CT 10/16/2023. FINDINGS: Brain: Cerebral volume is stable, normal for age. No midline shift,  ventriculomegaly, mass effect, evidence of mass lesion, intracranial hemorrhage or evidence of cortically based acute infarction. Small chronic right superior cerebellar infarct and patchy, confluent asymmetric bilateral white matter hypodensity is stable. Small area of chronic cortical encephalomalacia junction of the posterior left frontal, parietal and temporal lobes is stable. Vascular: Advanced Calcified atherosclerosis at the skull base. No suspicious intracranial vascular hyperdensity. Skull: Stable and intact. Sinuses/Orbits: Visualized paranasal sinuses and mastoids are stable and well aerated. Other: Calcified scalp vessel atherosclerosis. No acute orbit or scalp soft tissue finding. IMPRESSION: 1. No acute intracranial abnormality. Stable non contrast CT appearance of chronic ischemic disease. 2. Advanced calcified atherosclerosis, such as can be seen with advanced renal disease. Electronically Signed   By: VEAR Hurst M.D.   On: 11/20/2023 09:17   DG Chest Port 1 View Result Date: 11/20/2023 CLINICAL DATA:  Questionable sepsis.  EXAM: PORTABLE CHEST 1 VIEW COMPARISON:  10/17/2023 and CT chest 05/08/2006. FINDINGS: Trachea is midline. Heart size stable. Pacemaker lead tips are in the right atrium and right ventricle. Thoracic aorta is calcified. Lungs are low in volume with interstitial prominence and indistinctness. Possible tiny left pleural effusion. IMPRESSION: Favor mild congestive heart failure. Electronically Signed   By: Newell Eke M.D.   On: 11/20/2023 08:09    EKG: My personal interpretation of EKG shows: A-fib with controlled response    Assessment/Plan Principal Problem:   Sepsis (HCC) Active Problems:   Essential hypertension   Paroxysmal atrial fibrillation (HCC)   Hyperlipidemia associated with type 2 diabetes mellitus (HCC)   OSA (obstructive sleep apnea)   Diabetes mellitus type 2, uncomplicated (HCC)   Hyperlipemia    Assessment and Plan: 78 year old female with multiple medical problems including but not limited to atrial fibrillation on Eliquis , dual-chamber pacemaker for complete heart block, DM, CAD, HTN, ICH, CVA, seizures, fibromyalgia, anxiety/depression, OSA not on CPAP who was admitted and discharged after being treated for Enterococcus faecalis UTI in July 2025 brought in for sepsis likely due to pneumonia/UTI.  1.  Sepsis/pneumonia/UTI - Patient was given multiple antibiotics including cefepime , vancomycin , Flagyl  doxycycline  in the ED - Cultures were sent - She will be continued on cefepime  and vancomycin  due to the fact that she had recent admission and was treated treated with multiple antibiotics and had UTI. - Will follow the cultures - She will be given oxygen to maintain saturation more than 90%. - She does not usually uses oxygen at home.  2.  Metabolic encephalopathy - May be related to sepsis as mentioned above  3.  Congestive heart failure - Patient receives 20 mg of Lasix  daily. - Will give her 20 mg of Lasix  twice a day. - Daily weight/input output  charting  4.  Type 2 diabetes - Patient takes metformin  - Will give her insulin  sliding scale  5.  Seizure disorder - Will continue her on Keppra  1000 mg twice a day  6.  HTN-HLD - Continue atorvastatin  and Lasix   7.  A-fib - Stable - Continue on Eliquis     DVT prophylaxis: Eliquis  Code Status: Full Code Family Communication: None available Disposition Plan: Back to SNF Consults called: None Admission status: Inpatient, Telemetry bed   Nena Rebel, MD [facility] 11/20/2023, 2:47 PM

## 2023-11-20 NOTE — ED Notes (Signed)
 Called CCMD to add to central monitoring.

## 2023-11-20 NOTE — Progress Notes (Signed)
 Anticoagulation monitoring(Lovenox ):  78yo  F ordered Lovenox  40 mg Q24h    Filed Weights   11/20/23 0753  Weight: 81.6 kg (180 lb)   BMI 31   Lab Results  Component Value Date   CREATININE 1.70 (H) 11/20/2023   CREATININE 1.31 (H) 10/30/2023   CREATININE 1.36 (H) 10/29/2023   Estimated Creatinine Clearance: 28.5 mL/min (A) (by C-G formula based on SCr of 1.7 mg/dL (H)). Hemoglobin & Hematocrit     Component Value Date/Time   HGB 9.6 (L) 11/20/2023 0850   HGB 12.5 08/02/2012 1548   HCT 29.5 (L) 11/20/2023 0850   HCT 36.1 08/02/2012 1548     Per Protocol for Patient with estCrcl < 30 ml/min and BMI < 30, will transition to Lovenox  30 mg Q24h       Allean Haas PharmD Clinical Pharmacist 11/20/2023

## 2023-11-20 NOTE — Sepsis Progress Note (Signed)
 Elink monitoring for the code sepsis protocol.

## 2023-11-20 NOTE — Sepsis Progress Note (Signed)
 First documented times of blood cultures were 0757 and [phone]

## 2023-11-20 NOTE — ED Provider Notes (Signed)
 [facility] Provider Note    Event Date/Time   First MD Initiated Contact with Patient 11/20/23 [phone]     (approximate)   History   Fever   HPI  Traci Carter is a 78 y.o. female with history of A-fib on Eliquis , diabetes, dual-chamber pacemaker, seizures, fibromyalgia, who comes in with fevers  I reviewed where patient was seen on 10/16/2023 and was admitted on pressors and CRRT secondary to UTI.  Per EMS patient has been altered for the past couple of days and they had to put her on oxygen of 2 L.  According to family member they thought that she had CHF and they started her on Lasix .  According to family she has not been normal for a couple of days.  Unclear why it took so long for patient to be transported here.  Today was the first day the patient had a fever.  Physical Exam   Triage Vital Signs: ED Triage Vitals  Encounter Vitals Group     BP 11/20/23 0755 121/64     Girls Systolic BP Percentile --      Girls Diastolic BP Percentile --      Boys Systolic BP Percentile --      Boys Diastolic BP Percentile --      Pulse Rate 11/20/23 0750 (!) 112     Resp 11/20/23 0750 20     Temp 11/20/23 0750 (!) 102.2 F (39 C)     Temp Source 11/20/23 0750 Oral     SpO2 11/20/23 0750 (!) 89 %     Weight 11/20/23 0753 180 lb (81.6 kg)     Height --      Head Circumference --      Peak Flow --      Pain Score 11/20/23 0753 0     Pain Loc --      Pain Education --      Exclude from Growth Chart --     Most recent vital signs: Vitals:   11/20/23 0750 11/20/23 0755  BP:  121/64  Pulse: (!) 112   Resp: 20   Temp: (!) 102.2 F (39 C)   SpO2: (!) 89% 100%     General: Awake, no distress.  CV:  Good peripheral perfusion.  Tachycardic, A-fib Resp:  Normal effort.  Coarse breath sounds worse on the left Abd:  No distention.  Soft nontender No significant edema noted Other:  Patient appears confused unable to follow commands but is able to say  some words that do not really make sense.  She is spontaneously moving her extremities.   ED Results / Procedures / Treatments   Labs (all labs ordered are listed, but only abnormal results are displayed) Labs Reviewed  RESP PANEL BY RT-PCR (RSV, FLU A&B, COVID)  [id number]  CULTURE, BLOOD (ROUTINE X 2)  CULTURE, BLOOD (ROUTINE X 2)  LACTIC ACID, PLASMA  LACTIC ACID, PLASMA  COMPREHENSIVE METABOLIC PANEL WITH GFR  CBC WITH DIFFERENTIAL/PLATELET  PROTIME-INR  BLOOD GAS, VENOUS  BRAIN NATRIURETIC PEPTIDE  URINALYSIS, W/ REFLEX TO CULTURE (INFECTION SUSPECTED)  TROPONIN I (HIGH SENSITIVITY)     EKG  My interpretation of EKG:  Atrial fibrillation with a rate of 110 without any ST elevation, T wave inversions in V2 and V3, right bundle branch block  RADIOLOGY I have reviewed the xray personally and interpreted no focal pneumonia   PROCEDURES:  Critical Care performed: Yes, see critical care procedure note(s)  .1-3 Lead  EKG Interpretation  Performed by: Ernest Ronal BRAVO, MD Authorized by: Ernest Ronal BRAVO, MD     Interpretation: abnormal     ECG rate:  110   ECG rate assessment: tachycardic     Rhythm: atrial fibrillation     Ectopy: none     Conduction: normal   .Critical Care  Performed by: Ernest Ronal BRAVO, MD Authorized by: Ernest Ronal BRAVO, MD   Critical care provider statement:    Critical care time (minutes):  30   Critical care was necessary to treat or prevent imminent or life-threatening deterioration of the following conditions:  Sepsis   Critical care was time spent personally by me on the following activities:  Development of treatment plan with patient or surrogate, discussions with consultants, evaluation of patient's response to treatment, examination of patient, ordering and review of laboratory studies, ordering and review of radiographic studies, ordering and performing treatments and interventions, pulse oximetry, re-evaluation of patient's condition and review of  old charts    MEDICATIONS ORDERED IN ED: Medications  ceFEPIme  (MAXIPIME ) 2 g in sodium chloride  0.9 % 100 mL IVPB (has no administration in time range)  metroNIDAZOLE  (FLAGYL ) IVPB 500 mg (has no administration in time range)  vancomycin  (VANCOCIN ) IVPB 1000 mg/200 mL premix (has no administration in time range)  acetaminophen  (TYLENOL ) suppository 650 mg (has no administration in time range)  sodium chloride  0.9 % bolus 1,000 mL (has no administration in time range)     IMPRESSION / MDM / ASSESSMENT AND PLAN / ED COURSE  I reviewed the triage vital signs and the nursing notes.   Patient's presentation is most consistent with acute presentation with potential threat to life or bodily function.   Patient comes in meeting sepsis criteria therefore fluids broad-spectrum antibiotics were ordered.  Initially I have given 1 L of fluid although chest x-ray came back for possible edema and then family later collaborated that they were concerned patient could have CHF.  Patient had no worsening respiratory issues with getting the fluid.  Upon repeat evaluation she actually does seem more alert and she is able to state that she is feeling well and follow some commands such as lifting up her arms.  I will hold on additional fluid until we ensure that there is not edema but I wonder if this is more of an infectious cause given the fever and less likely CHF.  Given the fever I have given some rectal Tylenol  and I have started patient on broad-spectrum antibiotics.  Given the altered mental status and patient is on Eliquis  will get CT imaging of the head and neck as well as CT of the abdomen pelvis.  Doubt PE given patient on Eliquis .  BNP is slightly elevated but downtrending from previous.  His CBC shows normal white count hemoglobin is stable.  Lactate is slightly elevated but patient is getting some fluids.  VBG without any evidence of hypercapnia  CT imaging concerning for pneumonia.  Which seems  consistent with patient's exam and her need for oxygen.  I will add on Doxy to cover atypicals.  Patient will require discussion with the hospitalist for admission.  Discussed with the daughter who is her POA and patient is full code.  Patient's creatinine is slightly elevated from baseline at 1.7.  Will put in a second liter of fluid given this seems more consistent with sepsis but will have nurse monitor for any worsening breathing issues and will discontinue fluid if necessary and concern for edema.  But right now her BNP is lower than her baseline and her CT scan looks more consistent with infection however patient has required pressors previously so I do not want her to get hypotensive.  The patient is on the cardiac monitor to evaluate for evidence of arrhythmia and/or significant heart rate changes.   FINAL CLINICAL IMPRESSION(S) / ED DIAGNOSES   Final diagnoses:  Sepsis, due to unspecified organism, unspecified whether acute organ dysfunction present (HCC)  Pneumonia due to infectious organism, unspecified laterality, unspecified part of lung  Acute respiratory failure with hypoxia (HCC)     Rx / DC Orders   ED Discharge Orders     None        Note:  This document was prepared using Dragon voice recognition software and may include unintentional dictation errors.   Ernest Ronal BRAVO, MD 11/20/23 [phone]

## 2023-11-20 NOTE — Progress Notes (Signed)
 CODE SEPSIS - PHARMACY COMMUNICATION  **Broad Spectrum Antibiotics should be administered within 1 hour of Sepsis diagnosis**  Time Code Sepsis Called/Page Received: 9245  Antibiotics Ordered: Cefepime , vancomycin , Flagyl   Time of 1st antibiotic administration: 0806  Additional action taken by pharmacy: N/A  Traci Carter 11/20/2023  8:42 AM

## 2023-11-20 NOTE — Consult Note (Signed)
 Pharmacy Antibiotic Note  Traci Carter is a 78 y.o. female admitted on 11/20/2023 with fevers .  Patient was recently admitted and discharged from hospital on 10/16/2023, required pressors secondary to urinary tract infection. Pharmacy has been consulted for Vancomycin  dosing. Patient is also receiving Cefepime .  Plan: Vancomycin  1750mg  IV total as loading dose, followed by: Vancomycin  1250 mg IV Q 48 hrs. Goal AUC 400-550. Expected AUC: 448.3 Expected Cmin: 10.4 SCr used: 1.7, Vd used: 0.72   Weight: 81.6 kg (180 lb)  Temp (24hrs), Avg:99.4 F (37.4 C), Min:97.6 F (36.4 C), Max:102.2 F (39 C)  Recent Labs  Lab 11/20/23 0821 11/20/23 0850 11/20/23 1027  WBC  --  5.6  --   CREATININE  --  1.70*  --   LATICACIDVEN 2.3*  --  1.4    Estimated Creatinine Clearance: 28.5 mL/min (A) (by C-G formula based on SCr of 1.7 mg/dL (H)).    Allergies  Allergen Reactions   Codeine Other (See Comments)    GI Distress GI Distress GI Distress   Codeine    Digoxin     Other reaction(s): Unknown   Lactose Intolerance (Gi)    Lanoxin [Digoxin]    Penicillins    Zoloft [Sertraline Hcl]    Penicillins Rash and Other (See Comments)    Antimicrobials this admission: Vancomycin  8/18 >>  Cefepime  8/18 >>  Azithromycin  8/18 >> Doxycycline /Flagyl  8/18 x 1  Dose adjustments this admission: N/A  Microbiology results: 8/18 BCx: collected 8/18 UCx: collected  8/18 MRSA PCR: ordered  Thank you for allowing pharmacy to be a part of this patient's care.  Javaeh Muscatello A Zacharia Sowles 11/20/2023 3:08 PM

## 2023-11-20 NOTE — Progress Notes (Signed)
 Pt spouse passed away 11/07/2023

## 2023-11-20 NOTE — ED Triage Notes (Signed)
 Pt arrived via EMS from [facility] from having a fever with AMS all weekend long. Pt is alert but not orientated. Pt wears no o2 however pt is on 4l/min at this time. Per facility staff, pt was last normal on Wednesday.

## 2023-11-21 ENCOUNTER — Ambulatory Visit: Admitting: Podiatry

## 2023-11-21 DIAGNOSIS — J123 Human metapneumovirus pneumonia: Secondary | ICD-10-CM | POA: Diagnosis not present

## 2023-11-21 DIAGNOSIS — A419 Sepsis, unspecified organism: Secondary | ICD-10-CM

## 2023-11-21 DIAGNOSIS — J189 Pneumonia, unspecified organism: Secondary | ICD-10-CM

## 2023-11-21 LAB — COMPREHENSIVE METABOLIC PANEL WITH GFR
ALT: 12 U/L (ref 0–44)
AST: 21 U/L (ref 15–41)
Albumin: 2.7 g/dL — ABNORMAL LOW (ref 3.5–5.0)
Alkaline Phosphatase: 65 U/L (ref 38–126)
Anion gap: 11 (ref 5–15)
BUN: 36 mg/dL — ABNORMAL HIGH (ref 8–23)
CO2: 27 mmol/L (ref 22–32)
Calcium: 8.2 mg/dL — ABNORMAL LOW (ref 8.9–10.3)
Chloride: 105 mmol/L (ref 98–111)
Creatinine, Ser: 1.47 mg/dL — ABNORMAL HIGH (ref 0.44–1.00)
GFR, Estimated: 37 mL/min — ABNORMAL LOW (ref >60)
Glucose, Bld: 84 mg/dL (ref 70–99)
Potassium: 4 mmol/L (ref 3.5–5.1)
Sodium: 143 mmol/L (ref 135–145)
Total Bilirubin: 0.8 mg/dL (ref 0.0–1.2)
Total Protein: 5.9 g/dL — ABNORMAL LOW (ref 6.5–8.1)

## 2023-11-21 LAB — RESPIRATORY PANEL BY PCR

## 2023-11-21 LAB — CBC
HCT: 28.1 % — ABNORMAL LOW (ref 36.0–46.0)
Hemoglobin: 9.2 g/dL — ABNORMAL LOW (ref 12.0–15.0)
MCH: 31 pg (ref 26.0–34.0)
MCHC: 32.7 g/dL (ref 30.0–36.0)
MCV: 94.6 fL (ref 80.0–100.0)
Platelets: 123 K/uL — ABNORMAL LOW (ref 150–400)
RBC: 2.97 MIL/uL — ABNORMAL LOW (ref 3.87–5.11)
RDW: 13.7 % (ref 11.5–15.5)
WBC: 4.2 K/uL (ref 4.0–10.5)
nRBC: 0 % (ref 0.0–0.2)

## 2023-11-21 LAB — MRSA NEXT GEN BY PCR, NASAL: MRSA by PCR Next Gen: NOT DETECTED

## 2023-11-21 LAB — PROTIME-INR
INR: 1.6 — ABNORMAL HIGH (ref 0.8–1.2)
Prothrombin Time: 19.7 s — ABNORMAL HIGH (ref 11.4–15.2)

## 2023-11-21 LAB — GLUCOSE, CAPILLARY: Glucose-Capillary: 125 mg/dL — ABNORMAL HIGH (ref 70–99)

## 2023-11-21 MED ORDER — QUETIAPINE FUMARATE 25 MG PO TABS
25.0000 mg | ORAL_TABLET | Freq: Every day | ORAL | Status: DC
  Administered 2023-11-21 – 2023-11-22 (×2): 25 mg via ORAL
  Administered 2023-11-23 – 2023-11-30 (×8): 50 mg via ORAL
  Filled 2023-11-21 (×7): qty 2
  Filled 2023-11-21: qty 1
  Filled 2023-11-21 (×2): qty 2

## 2023-11-21 MED ORDER — PSEUDOEPHEDRINE HCL ER 120 MG PO TB12
120.0000 mg | ORAL_TABLET | Freq: Two times a day (BID) | ORAL | Status: AC
  Administered 2023-11-21 – 2023-11-23 (×5): 120 mg via ORAL
  Filled 2023-11-21 (×6): qty 1

## 2023-11-21 MED ORDER — DOXYCYCLINE HYCLATE 100 MG PO TABS
100.0000 mg | ORAL_TABLET | Freq: Two times a day (BID) | ORAL | Status: AC
  Administered 2023-11-21 – 2023-11-24 (×8): 100 mg via ORAL
  Filled 2023-11-21 (×8): qty 1

## 2023-11-21 MED ORDER — GLIPIZIDE ER 2.5 MG PO TB24
2.5000 mg | ORAL_TABLET | Freq: Every day | ORAL | Status: DC
  Administered 2023-11-21 – 2023-11-24 (×4): 2.5 mg via ORAL
  Filled 2023-11-21 (×4): qty 1

## 2023-11-21 MED ORDER — IPRATROPIUM-ALBUTEROL 0.5-2.5 (3) MG/3ML IN SOLN
3.0000 mL | Freq: Once | RESPIRATORY_TRACT | Status: AC
  Administered 2023-11-21: 3 mL via RESPIRATORY_TRACT
  Filled 2023-11-21: qty 3

## 2023-11-21 MED ORDER — IPRATROPIUM-ALBUTEROL 0.5-2.5 (3) MG/3ML IN SOLN
3.0000 mL | Freq: Four times a day (QID) | RESPIRATORY_TRACT | Status: DC
  Administered 2023-11-21 (×2): 3 mL via RESPIRATORY_TRACT
  Filled 2023-11-21 (×2): qty 3

## 2023-11-21 MED ORDER — IPRATROPIUM-ALBUTEROL 0.5-2.5 (3) MG/3ML IN SOLN
3.0000 mL | Freq: Three times a day (TID) | RESPIRATORY_TRACT | Status: DC
  Administered 2023-11-22 – 2023-11-23 (×4): 3 mL via RESPIRATORY_TRACT
  Filled 2023-11-21 (×4): qty 3

## 2023-11-21 MED ORDER — DM-GUAIFENESIN ER 30-600 MG PO TB12
1.0000 | ORAL_TABLET | Freq: Two times a day (BID) | ORAL | Status: AC
  Administered 2023-11-21 – 2023-11-23 (×5): 1 via ORAL
  Filled 2023-11-21 (×5): qty 1

## 2023-11-21 MED ORDER — SODIUM CHLORIDE 0.9 % IV SOLN
2.0000 g | Freq: Two times a day (BID) | INTRAVENOUS | Status: DC
  Administered 2023-11-21 – 2023-11-22 (×2): 2 g via INTRAVENOUS
  Filled 2023-11-21 (×3): qty 12.5

## 2023-11-21 NOTE — Evaluation (Signed)
 Physical Therapy Evaluation Patient Details Name: Traci Carter MRN: [id number] DOB: [birth year] Today's Date: 11/21/2023  History of Present Illness  Patient is a 78 year old female with sepsis likely due to pneumonia/UTI. PMH: atrial fibrillation, pacemaker for complete heart block, DM, CAD, HTN, ICH, CVA, seizures, fibromyalgia, anxiety/depression, OSA  Clinical Impression  Patient is agreeable to PT evaluation. She reports she required staff assistance for mobility and was working on ambulating short distance with a rolling walker at SNF.  Today the patient required maximal assistance for bed mobility. Attempted standing, however patient with minimal to no active participation with standing efforts despite cues and facilitation. Max A required for lateral scooting. Patient is fatigued with minimal activity. Recommend to continue PT to maximize independence. Recommend to resume rehabilitation < 3 hours/day after this hospital stay.       If plan is discharge home, recommend the following: Two people to help with walking and/or transfers;A lot of help with bathing/dressing/bathroom;Assistance with cooking/housework;Assist for transportation;Help with stairs or ramp for entrance;Direct supervision/assist for medications management   Can travel by private vehicle   No    Equipment Recommendations None recommended by PT  Recommendations for Other Services  OT consult    Functional Status Assessment Patient has had a recent decline in their functional status and demonstrates the ability to make significant improvements in function in a reasonable and predictable amount of time.     Precautions / Restrictions Precautions Precautions: Fall Recall of Precautions/Restrictions: Impaired Restrictions Weight Bearing Restrictions Per Provider Order: No      Mobility  Bed Mobility Overal bed mobility: Needs Assistance Bed Mobility: Sit to Supine, Supine to Sit     Supine to sit: Max  assist Sit to supine: Mod assist   General bed mobility comments: assistance for trunk support and scooting assistance provided to get to edge of bed. assistance for BLE for return to bed. cues for sequencing, technique    Transfers Overall transfer level: Needs assistance                 General transfer comment: attempted sit to stand transfer x 2 bouts with no initiation provided by the patient despite facilitation and cues. Max A for lateral scooting    Ambulation/Gait               General Gait Details: unable to at this time  Stairs            Wheelchair Mobility     Tilt Bed    Modified Rankin (Stroke Patients Only)       Balance Overall balance assessment: Needs assistance Sitting-balance support: Feet supported Sitting balance-Leahy Scale: Fair Sitting balance - Comments: initial posterior lean that improves with time Postural control: Posterior lean                                   Pertinent Vitals/Pain Pain Assessment Pain Assessment: No/denies pain    Home Living Family/patient expects to be discharged to:: Skilled nursing facility                   Additional Comments: recent discharge from hospital to SNF    Prior Function Prior Level of Function : Needs assist             Mobility Comments: poor historian. patient reports she was ambulatory with staff assistance using a rolling walker ADLs Comments: able to  feed herself and complete partial bathing with set-up. staff assists intermittently as needed     Extremity/Trunk Assessment   Upper Extremity Assessment Upper Extremity Assessment: Generalized weakness    Lower Extremity Assessment Lower Extremity Assessment: Generalized weakness       Communication   Communication Communication: Impaired Factors Affecting Communication: Difficulty expressing self    Cognition Arousal: Alert Behavior During Therapy: WFL for tasks assessed/performed    PT - Cognitive impairments: No family/caregiver present to determine baseline, Memory, Sequencing                         Following commands: Impaired Following commands impaired: Follows one step commands with increased time     Cueing Cueing Techniques: Verbal cues, Gestural cues     General Comments General comments (skin integrity, edema, etc.): patient does express general fear of falling due to history of falls. mild dyspnea with exertion, productive coughing with activity    Exercises     Assessment/Plan    PT Assessment Patient needs continued PT services  PT Problem List Decreased strength;Decreased range of motion;Decreased activity tolerance;Decreased balance;Decreased mobility;Decreased safety awareness;Decreased cognition       PT Treatment Interventions DME instruction;Gait training;Functional mobility training;Therapeutic activities;Therapeutic exercise;Cognitive remediation;Neuromuscular re-education;Balance training;Wheelchair mobility training;Patient/family education    PT Goals (Current goals can be found in the Care Plan section)  Acute Rehab PT Goals Patient Stated Goal: to feel better PT Goal Formulation: With patient Time For Goal Achievement: 12/05/23 Potential to Achieve Goals: Fair    Frequency Min 1X/week     Co-evaluation               AM-PAC PT 6 Clicks Mobility  Outcome Measure Help needed turning from your back to your side while in a flat bed without using bedrails?: A Lot Help needed moving from lying on your back to sitting on the side of a flat bed without using bedrails?: A Lot Help needed moving to and from a bed to a chair (including a wheelchair)?: Total Help needed standing up from a chair using your arms (e.g., wheelchair or bedside chair)?: Total Help needed to walk in hospital room?: Total Help needed climbing 3-5 steps with a railing? : Total 6 Click Score: 8    End of Session Equipment Utilized During  Treatment: Oxygen Activity Tolerance: Patient limited by fatigue Patient left: in bed;with call bell/phone within reach;with bed alarm set;with nursing/sitter in room Nurse Communication: Mobility status PT Visit Diagnosis: Muscle weakness (generalized) (M62.81);Unsteadiness on feet (R26.81)    Time: 9246-9184 PT Time Calculation (min) (ACUTE ONLY): 22 min   Charges:   PT Evaluation $PT Eval Moderate Complexity: 1 Mod PT Treatments $Therapeutic Activity: 8-22 mins PT General Charges $$ ACUTE PT VISIT: 1 Visit         Randine Essex, PT, MPT   Randine LULLA Essex 11/21/2023, 9:32 AM

## 2023-11-21 NOTE — Progress Notes (Signed)
 Patient's Daughter stated that she does not want patient to be sent back to [facility] upon discharge due to her having two falls while there. She is not confident in the care that she was receiving at Facility.

## 2023-11-21 NOTE — Progress Notes (Incomplete)
 Patient's Daughter would like to restrict visitors to only the following :  [place], [place],

## 2023-11-21 NOTE — Progress Notes (Signed)
 PHARMACY NOTE:  ANTIMICROBIAL RENAL DOSAGE ADJUSTMENT  Current antimicrobial regimen includes a mismatch between antimicrobial dosage and estimated renal function.  As per policy approved by the Pharmacy & Therapeutics and Medical Executive Committees, the antimicrobial dosage will be adjusted accordingly.  Current antimicrobial dosage:   cefepime  2g q24h  Indication:  sepsis c/f pneumonia  Renal Function: Scr 1.47 (8/19), 1.7 (8/18)  Estimated Creatinine Clearance: 36.6 mL/min (A) (by C-G formula based on SCr of 1.47 mg/dL (H)).    Antimicrobial dosage has been changed to:   cefepime  2g q12h -dosing for CrCl 30-50  Additional comments: Patient with past medical history including seizure and CVA/ICH. No episodes this admission. Will continue to monitor.   Thank you for allowing pharmacy to be a part of this patient's care.  Leonor JAYSON Argyle, [facility] 11/21/2023 12:44 PM

## 2023-11-21 NOTE — Progress Notes (Signed)
 PROGRESS NOTE  Traci Carter    DOB: 12-25-1945, 78 y.o.  FMW:[id number]    Code Status: Full Code   DOA: 11/20/2023   LOS: 1   Brief hospital course  Traci Carter is a 78 y.o. female with a PMH significant for  A-fib on Eliquis , DM, dual-chamber pacemaker, ICH, GERD, seizure, anxiety/depression, fibromyalgia who was brought into ED from a nursing facility for evaluation of fevers.  Patient was recently admitted and discharged from hospital on 10/16/2023, required pressors secondary to urinary tract infection.   In the ED, it was found that they had temp 102.2, tachycardic around 112 and tachypneic around 20 saturating 89%. No O2 requirement at baseline.  Significant findings included various imaging showing multifocal pneumonia. LA 2.3, WBC 5.6.   They were initially treated with cefepime , doxycycline , Flagyl  and vancomycin  in the emergency room. Cultures were sent.   Patient was admitted to medicine service for further workup and management of sepsis 2/2 PNA as outlined in detail below.  11/21/23 -stable on 2Lnc  Assessment & Plan  Principal Problem:   Sepsis (HCC) Active Problems:   Essential hypertension   Paroxysmal atrial fibrillation (HCC)   Hyperlipidemia associated with type 2 diabetes mellitus (HCC)   OSA (obstructive sleep apnea)   Diabetes mellitus type 2, uncomplicated (HCC)   Hyperlipemia  Sepsis with evidence of pneumonia and UTI- multifocal viral pneumonia. RVP Positive for metapneumovirus.  UxCx positive for klebsiella. Sensitivities pending - Patient was given multiple antibiotics including cefepime , vancomycin , Flagyl  doxycycline  in the ED - Cultures were sent- UxCx positive for klebsiella. Sensitivities pending. MRSA swab negative- will dc vancomycin  - She will be given oxygen to maintain saturation more than 90%. Wean as tolerated   Congestive heart failure - continue  20 mg of Lasix  twice a day. - Daily weight/input output charting   Type 2  diabetes- Patient takes metformin  - insulin  sliding scale   Seizure disorder - continue her on Keppra  1000 mg twice a day   HTN-HLD - Continue atorvastatin  and Lasix    A-fib- Stable - Continue on Eliquis   Body mass index is 38.94 kg/m.  VTE ppx: SCDs Start: 11/20/23 1031 apixaban  (ELIQUIS ) tablet 5 mg   Diet:     Diet   Diet regular Room service appropriate? Yes; Fluid consistency: Thin   Consultants: None   Subjective 11/21/23    Pt reports having some wheezing. Denies SOB at rest. Denies pain   Objective  Blood pressure 111/66, pulse 86, temperature 98.5 F (36.9 C), resp. rate 15, height 5' 3 (1.6 m), weight 99.7 kg, SpO2 99%.  Intake/Output Summary (Last 24 hours) at 11/21/2023 0735 Last data filed at 11/21/2023 0509 Gross per 24 hour  Intake 345.3 ml  Output 3150 ml  Net -2804.7 ml   Filed Weights   11/20/23 0753 11/20/23 1752  Weight: 81.6 kg 99.7 kg     Physical Exam:  General: awake, alert, NAD HEENT: atraumatic, clear conjunctiva, anicteric sclera, MMM, hearing grossly normal Respiratory: normal respiratory effort. Inspiratory wheezing scattered throughout. Significant ronchi Cardiovascular: quick capillary refill, normal S1/S2, RRR, no JVD, murmurs Gastrointestinal: soft, NT, ND Nervous: A&O x3. no gross focal neurologic deficits, normal speech Extremities: moves all equally, no edema, normal tone Skin: dry, intact, normal temperature, normal color. No rashes, lesions or ulcers on exposed skin Psychiatry: normal mood, congruent affect  Labs   I have personally reviewed the following labs and imaging studies CBC    Component Value Date/Time   WBC 4.2  11/21/2023 0344   RBC 2.97 (L) 11/21/2023 0344   HGB 9.2 (L) 11/21/2023 0344   HGB 12.5 08/02/2012 1548   HCT 28.1 (L) 11/21/2023 0344   HCT 36.1 08/02/2012 1548   PLT 123 (L) 11/21/2023 0344   PLT 270 08/02/2012 1548   MCV 94.6 11/21/2023 0344   MCV 91 08/02/2012 1548   MCH 31.0 11/21/2023  0344   MCHC 32.7 11/21/2023 0344   RDW 13.7 11/21/2023 0344   RDW 13.1 08/02/2012 1548   LYMPHSABS 1.3 11/20/2023 0850   LYMPHSABS 3.2 08/02/2012 1548   MONOABS 0.6 11/20/2023 0850   MONOABS 0.9 08/02/2012 1548   EOSABS 0.1 11/20/2023 0850   EOSABS 0.3 08/02/2012 1548   BASOSABS 0.0 11/20/2023 0850   BASOSABS 0.1 08/02/2012 1548      Latest Ref Rng & Units 11/21/2023    3:44 AM 11/20/2023    8:50 AM 10/30/2023    8:52 AM  BMP  Glucose 70 - 99 mg/dL 84  849  810   BUN 8 - 23 mg/dL 36  35  19   Creatinine 0.44 - 1.00 mg/dL 8.52  8.29  8.68   Sodium 135 - 145 mmol/L 143  143  144   Potassium 3.5 - 5.1 mmol/L 4.0  3.4  4.3   Chloride 98 - 111 mmol/L 105  100  107   CO2 22 - 32 mmol/L 27  26  25    Calcium  8.9 - 10.3 mg/dL 8.2  8.5  9.1     CT Renal Stone Study Result Date: 11/20/2023 CLINICAL DATA:  78 year old female with fever, altered mental status, possible sepsis. Abdomen and flank pain. EXAM: CT ABDOMEN AND PELVIS WITHOUT CONTRAST TECHNIQUE: Multidetector CT imaging of the abdomen and pelvis was performed following the standard protocol without IV contrast. RADIATION DOSE REDUCTION: This exam was performed according to the departmental dose-optimization program which includes automated exposure control, adjustment of the mA and/or kV according to patient size and/or use of iterative reconstruction technique. COMPARISON:  Portable chest x-ray today. Noncontrast CT Abdomen and Pelvis 10/16/2023. FINDINGS: Lower chest: Cardiac pacemaker lead. Stable mild cardiomegaly. No pericardial effusion. Respiratory motion artifact. Similar lung volumes and new from last month confluent peribronchial and airspace opacity in the lower lobes greater on the left. Left lower lobe air bronchograms. No pleural effusion. Hepatobiliary: Chronic cholecystectomy.  Negative noncontrast liver. Pancreas: Partial atrophy. Spleen: Negative; punctate splenic calcified granuloma or vascular calcifications.  Adrenals/Urinary Tract: Normal adrenal glands. Stable kidneys, appear nonobstructed with chronic nonspecific pararenal fat stranding or edema. No change from last month. Punctate nephrolithiasis more apparent on the left, and superimposed renal vascular calcifications. Ureters remain diminutive. Chronic pelvic phleboliths. Negative urinary bladder. Stomach/Bowel: Moderate large bowel redundancy and retained stool. No large bowel inflammation identified, probable previous appendectomy. Nondilated terminal ileum and small bowel. Nondilated stomach and duodenum. No pneumoperitoneum or free fluid. Vascular/Lymphatic: Chronic severe calcified atherosclerosis. The blood pool hypodensity. Normal caliber abdominal aorta. Vascular patency is not evaluated in the absence of IV contrast. No lymphadenopathy. Reproductive: Chronically diminutive or absent. Other: No pelvis free fluid. Musculoskeletal: Advanced spine degeneration. Thoracic spine hyperostosis with some interbody ankylosis. Widespread lumbar vacuum disc is chronic. No acute osseous abnormality identified. IMPRESSION: 1. Left greater than right lower lobe Pneumonia, consider Aspiration. No pleural effusion. 2. No superimposed acute or inflammatory process identified in the noncontrast abdomen or pelvis; Chronic nephrolithiasis.  Chronic perinephric edema, nonspecific. Blood pool hypodensity, query Anemia. Advanced chronic calcified atherosclerosis, Aortic Atherosclerosis (ICD10-I70.0). Electronically  Signed   By: VEAR Hurst M.D.   On: 11/20/2023 09:26   CT Cervical Spine Wo Contrast Result Date: 11/20/2023 CLINICAL DATA:  78 year old female with fever, altered mental status, possible sepsis. Abdomen and flank pain. EXAM: CT CERVICAL SPINE WITHOUT CONTRAST TECHNIQUE: Multidetector CT imaging of the cervical spine was performed without intravenous contrast. Multiplanar CT image reconstructions were also generated. RADIATION DOSE REDUCTION: This exam was performed  according to the departmental dose-optimization program which includes automated exposure control, adjustment of the mA and/or kV according to patient size and/or use of iterative reconstruction technique. COMPARISON:  Head CT today.  Portable chest x-ray today 0800 hours. FINDINGS: Alignment: Straightening of cervical lordosis. Cervicothoracic junction alignment is within normal limits. Bilateral posterior element alignment is within normal limits. Skull base and vertebrae: Visualized skull base is intact. No atlanto-occipital dissociation. C1 and C2 appear intact and aligned. No acute osseous abnormality identified. Soft tissues and spinal canal: No prevertebral fluid or swelling. No visible canal hematoma. Bulky calcified cervical carotid atherosclerosis. Disc levels: Bulky degenerative disc osteophyte complex at C4-C5. Probable mild associated spinal stenosis. Upper chest: Left subclavian approach pacemaker type leads. Calcified aortic atherosclerosis. Patchy, confluent opacity partially visible in the right upper lobe series 2, image 85. IMPRESSION: 1. No acute osseous abnormality identified in the cervical spine. 2. Bulky degenerative disc osteophyte complex at C4-C5 with probable mild spinal stenosis. 3. Patchy, confluent opacity partially visible in the Right upper lobe, nonspecific and might be edema or infection. 4. Calcified carotid and Aortic Atherosclerosis (ICD10-I70.0). Electronically Signed   By: VEAR Hurst M.D.   On: 11/20/2023 09:20   CT HEAD WO CONTRAST ( ) Result Date: 11/20/2023 CLINICAL DATA:  78 year old female with fever, altered mental status, possible sepsis. Abdomen and flank pain. EXAM: CT HEAD WITHOUT CONTRAST TECHNIQUE: Contiguous axial images were obtained from the base of the skull through the vertex without intravenous contrast. RADIATION DOSE REDUCTION: This exam was performed according to the departmental dose-optimization program which includes automated exposure control,  adjustment of the mA and/or kV according to patient size and/or use of iterative reconstruction technique. COMPARISON:  Head CT 10/16/2023. FINDINGS: Brain: Cerebral volume is stable, normal for age. No midline shift, ventriculomegaly, mass effect, evidence of mass lesion, intracranial hemorrhage or evidence of cortically based acute infarction. Small chronic right superior cerebellar infarct and patchy, confluent asymmetric bilateral white matter hypodensity is stable. Small area of chronic cortical encephalomalacia junction of the posterior left frontal, parietal and temporal lobes is stable. Vascular: Advanced Calcified atherosclerosis at the skull base. No suspicious intracranial vascular hyperdensity. Skull: Stable and intact. Sinuses/Orbits: Visualized paranasal sinuses and mastoids are stable and well aerated. Other: Calcified scalp vessel atherosclerosis. No acute orbit or scalp soft tissue finding. IMPRESSION: 1. No acute intracranial abnormality. Stable non contrast CT appearance of chronic ischemic disease. 2. Advanced calcified atherosclerosis, such as can be seen with advanced renal disease. Electronically Signed   By: VEAR Hurst M.D.   On: 11/20/2023 09:17   DG Chest Port 1 View Result Date: 11/20/2023 CLINICAL DATA:  Questionable sepsis. EXAM: PORTABLE CHEST 1 VIEW COMPARISON:  10/17/2023 and CT chest 05/08/2006. FINDINGS: Trachea is midline. Heart size stable. Pacemaker lead tips are in the right atrium and right ventricle. Thoracic aorta is calcified. Lungs are low in volume with interstitial prominence and indistinctness. Possible tiny left pleural effusion. IMPRESSION: Favor mild congestive heart failure. Electronically Signed   By: Newell Eke M.D.   On: 11/20/2023 08:09    Disposition  Plan & Communication  Patient status: Inpatient  Admitted From: SNF Planned disposition location: Skilled nursing facility Anticipated discharge date: 8/22 pending clinical improvement  Family  Communication: none at bedside    Author: Marien LITTIE Piety, DO [facility] 11/21/2023, 7:35 AM   Available by Epic secure chat 7AM-7PM. If 7PM-7AM, please contact night-coverage.  [facility] contact information found on [email or web address].

## 2023-11-21 NOTE — Plan of Care (Signed)
°  Problem: Health Behavior/Discharge Planning: °Goal: Ability to manage health-related needs will improve °Outcome: Progressing °  °Problem: Clinical Measurements: °Goal: Diagnostic test results will improve °Outcome: Progressing °  °Problem: Activity: °Goal: Risk for activity intolerance will decrease °Outcome: Progressing °  °Problem: Coping: °Goal: Level of anxiety will decrease °Outcome: Progressing °  °

## 2023-11-22 DIAGNOSIS — A419 Sepsis, unspecified organism: Secondary | ICD-10-CM | POA: Diagnosis not present

## 2023-11-22 LAB — URINE CULTURE: Culture: >=100000 — AB

## 2023-11-22 LAB — STREP PNEUMONIAE URINARY ANTIGEN: Strep Pneumo Urinary Antigen: NEGATIVE

## 2023-11-22 MED ORDER — SODIUM CHLORIDE 0.9 % IV SOLN
1.0000 g | INTRAVENOUS | Status: DC
  Administered 2023-11-22 – 2023-11-26 (×5): 1 g via INTRAVENOUS
  Filled 2023-11-22 (×6): qty 10

## 2023-11-22 NOTE — Plan of Care (Signed)
  Problem: Education: Goal: Knowledge of General Education information will improve Description: Including pain rating scale, medication(s)/side effects and non-pharmacologic comfort measures Outcome: Progressing   Problem: Clinical Measurements: Goal: Ability to maintain clinical measurements within normal limits will improve Outcome: Progressing Goal: Will remain free from infection Outcome: Progressing Goal: Diagnostic test results will improve Outcome: Progressing Goal: Respiratory complications will improve Outcome: Progressing Goal: Cardiovascular complication will be avoided Outcome: Progressing   Problem: Activity: Goal: Risk for activity intolerance will decrease Outcome: Progressing   Problem: Coping: Goal: Level of anxiety will decrease Outcome: Progressing   Problem: Nutrition: Goal: Adequate nutrition will be maintained Outcome: Progressing

## 2023-11-22 NOTE — Plan of Care (Signed)
  Problem: Health Behavior/Discharge Planning: Goal: Ability to manage health-related needs will improve Outcome: Progressing   Problem: Clinical Measurements: Goal: Will remain free from infection Outcome: Progressing   Problem: Nutrition: Goal: Adequate nutrition will be maintained Outcome: Progressing   Problem: Safety: Goal: Ability to remain free from injury will improve Outcome: Progressing

## 2023-11-22 NOTE — Progress Notes (Signed)
 PROGRESS NOTE    Traci Carter  FMW:[id number] DOB: 1946/01/15 DOA: 11/20/2023 PCP: Patient, No Pcp Per   Assessment & Plan:   Principal Problem:   Sepsis (HCC) Active Problems:   Essential hypertension   Paroxysmal atrial fibrillation (HCC)   Hyperlipidemia associated with type 2 diabetes mellitus (HCC)   OSA (obstructive sleep apnea)   Diabetes mellitus type 2, uncomplicated (HCC)   Hyperlipemia  Assessment and Plan: Sepsis: see Dr. Lamount notes on how pt met sepsis criteria. Likely secondary to pneumonia & UTI  Multifocal pneumonia: positive for metapneumovirus. Possible superimposed bacteria infection, continue on IV rocephin , doxy. Continue w/ supportive care  UTI: urine cx growing klebsiella. Continue on IV rocephin    Thrombocytopenia: etiology unclear. Will continue to monitor    CHF: unknown systolic vs diastolic vs combined. Monitor I/Os. Continue on home dose of lasix   DM2: well controlled, HbA1c 5.7. Continue on SSI w/ accuchecks    Seizure disorder: continue on home dose of keppra .   HTN: continue on lasix   HLD: continue on statin   Likely PAF: continue on eliquis        DVT prophylaxis: eliquis  Code Status: full  Family Communication:  Disposition Plan: possibly d/c to SNF  Level of care: Telemetry Medical Consultants:    Procedures:  Antimicrobials: rocephin , doxy   Subjective: Pt c/o shortness of breath   Objective: Vitals:   11/21/23 2346 11/22/23 0419 11/22/23 0722 11/22/23 1124  BP: 103/61 117/71 111/74 123/67  Pulse: (!) 101 90 (!) 110 (!) 105  Resp: 15 15 16 16   Temp: 98.4 F (36.9 C) 97.9 F (36.6 C) 98 F (36.7 C) 99 F (37.2 C)  TempSrc: Oral Oral Oral Oral  SpO2: 90%  (!) 88% 98%  Weight:      Height:        Intake/Output Summary (Last 24 hours) at 11/22/2023 1145 Last data filed at 11/21/2023 1900 Gross per 24 hour  Intake 0 ml  Output --  Net 0 ml   Filed Weights   11/20/23 0753 11/20/23 1752   Weight: 81.6 kg 99.7 kg    Examination:  General exam: Appears uncomfortable  Respiratory system: course breath sounds b/l  Cardiovascular system: S1 & S2 +. No rubs, gallops or clicks. Gastrointestinal system: Abdomen is nondistended, soft and nontender.  Normal bowel sounds heard. Central nervous system: Alert and awake Moves all extremities  Psychiatry: Judgement and insight appears not at baseline. Flat mood and affect     Data Reviewed: I have personally reviewed following labs and imaging studies  CBC: Recent Labs  Lab 11/20/23 0850 11/21/23 0344  WBC 5.6 4.2  NEUTROABS 3.5  --   HGB 9.6* 9.2*  HCT 29.5* 28.1*  MCV 94.2 94.6  PLT 134* 123*   Basic Metabolic Panel: Recent Labs  Lab 11/20/23 0850 11/21/23 0344  NA 143 143  K 3.4* 4.0  CL 100 105  CO2 26 27  GLUCOSE 150* 84  BUN 35* 36*  CREATININE 1.70* 1.47*  CALCIUM  8.5* 8.2*   GFR: Estimated Creatinine Clearance: 36.6 mL/min (A) (by C-G formula based on SCr of 1.47 mg/dL (H)). Liver Function Tests: Recent Labs  Lab 11/20/23 0850 11/21/23 0344  AST 25 21  ALT 13 12  ALKPHOS 71 65  BILITOT 0.6 0.8  PROT 6.2* 5.9*  ALBUMIN  2.8* 2.7*   No results for input(s): LIPASE, AMYLASE in the last 168 hours. No results for input(s): AMMONIA in the last 168 hours. Coagulation Profile: Recent Labs  Lab 11/20/23 0850 11/21/23 0344  INR 1.9* 1.6*   Cardiac Enzymes: No results for input(s): CKTOTAL, CKMB, CKMBINDEX, TROPONINI in the last 168 hours. BNP (last 3 results) No results for input(s): PROBNP in the last 8760 hours. HbA1C: No results for input(s): HGBA1C in the last 72 hours. CBG: Recent Labs  Lab 11/21/23 2015  GLUCAP 125*   Lipid Profile: No results for input(s): CHOL, HDL, LDLCALC, TRIG, CHOLHDL, LDLDIRECT in the last 72 hours. Thyroid Function Tests: No results for input(s): TSH, T4TOTAL, FREET4, T3FREE, THYROIDAB in the last 72 hours. Anemia  Panel: No results for input(s): VITAMINB12, FOLATE, FERRITIN, TIBC, IRON, RETICCTPCT in the last 72 hours. Sepsis Labs: Recent Labs  Lab 11/20/23 9178 11/20/23 1027  LATICACIDVEN 2.3* 1.4    Recent Results (from the past 240 hours)  Blood Culture (routine x 2)     Status: None (Preliminary result)   Collection Time: 11/20/23  8:20 AM   Specimen: BLOOD  Result Value Ref Range Status   Specimen Description BLOOD LEFT ANTECUBITAL  Final   Special Requests   Final    BOTTLES DRAWN AEROBIC AND ANAEROBIC Blood Culture adequate volume   Culture   Final    NO GROWTH 2 DAYS Performed at [facility], [address], [place], [place] [address]    Report Status PENDING  Incomplete  Resp panel by RT-PCR (RSV, Flu A&B, Covid) Anterior Nasal Swab     Status: None   Collection Time: 11/20/23  8:21 AM   Specimen: Anterior Nasal Swab  Result Value Ref Range Status   SARS Coronavirus 2 by RT PCR NEGATIVE NEGATIVE Final    Comment: (NOTE) SARS-CoV-2 target nucleic acids are NOT DETECTED.  The SARS-CoV-2 RNA is generally detectable in upper respiratory specimens during the acute phase of infection. The lowest concentration of SARS-CoV-2 viral copies this assay can detect is 138 copies/mL. A negative result does not preclude SARS-Cov-2 infection and should not be used as the sole basis for treatment or other patient management decisions. A negative result may occur with  improper specimen collection/handling, submission of specimen other than nasopharyngeal swab, presence of viral mutation(s) within the areas targeted by this assay, and inadequate number of viral copies(<138 copies/mL). A negative result must be combined with clinical observations, patient history, and epidemiological information. The expected result is Negative.  Fact Sheet for Patients:  BloggerCourse.com  Fact Sheet for Healthcare Providers:   SeriousBroker.it  This test is no t yet approved or cleared by the [place]  FDA and  has been authorized for detection and/or diagnosis of SARS-CoV-2 by FDA under an Emergency Use Authorization (EUA). This EUA will remain  in effect (meaning this test can be used) for the duration of the COVID-19 declaration under Section 564(b)(1) of the Act, 21 U.S.C.section 360bbb-3(b)(1), unless the authorization is terminated  or revoked sooner.       Influenza A by PCR NEGATIVE NEGATIVE Final   Influenza B by PCR NEGATIVE NEGATIVE Final    Comment: (NOTE) The Xpert Xpress SARS-CoV-2/FLU/RSV plus assay is intended as an aid in the diagnosis of influenza from Nasopharyngeal swab specimens and should not be used as a sole basis for treatment. Nasal washings and aspirates are unacceptable for Xpert Xpress SARS-CoV-2/FLU/RSV testing.  Fact Sheet for Patients: BloggerCourse.com  Fact Sheet for Healthcare Providers: SeriousBroker.it  This test is not yet approved or cleared by the [place]  FDA and has been authorized for detection and/or diagnosis of SARS-CoV-2 by FDA under an  Emergency Use Authorization (EUA). This EUA will remain in effect (meaning this test can be used) for the duration of the COVID-19 declaration under Section 564(b)(1) of the Act, 21 U.S.C. section 360bbb-3(b)(1), unless the authorization is terminated or revoked.     Resp Syncytial Virus by PCR NEGATIVE NEGATIVE Final    Comment: (NOTE) Fact Sheet for Patients: BloggerCourse.com  Fact Sheet for Healthcare Providers: SeriousBroker.it  This test is not yet approved or cleared by the [place]  FDA and has been authorized for detection and/or diagnosis of SARS-CoV-2 by FDA under an Emergency Use Authorization (EUA). This EUA will remain in effect (meaning this test can be used) for  the duration of the COVID-19 declaration under Section 564(b)(1) of the Act, 21 U.S.C. section 360bbb-3(b)(1), unless the authorization is terminated or revoked.  Performed at [facility], [address] [place], [place] [address]   Urine Culture     Status: Abnormal   Collection Time: 11/20/23  8:21 AM   Specimen: Urine, Random  Result Value Ref Range Status   Specimen Description   Final    URINE, RANDOM Performed at [facility], [address] [place], [place] [address]    Special Requests   Final    NONE Reflexed from [id number] Performed at [facility], [address] [place], [place] [address]    Culture >=100,000 COLONIES/mL KLEBSIELLA PNEUMONIAE (A)  Final   Report Status 11/22/2023 FINAL  Final   Organism ID, Bacteria KLEBSIELLA PNEUMONIAE (A)  Final      Susceptibility   Klebsiella pneumoniae - MIC*    AMPICILLIN >=32 RESISTANT Resistant     CEFAZOLIN  (URINE) Value in next row Sensitive      4 SENSITIVEThis is a modified FDA-approved test that has been validated and its performance characteristics determined by the reporting laboratory.  This laboratory is certified under the Clinical Laboratory Improvement Amendments CLIA as qualified to perform high complexity clinical laboratory testing.    CEFEPIME  Value in next row Sensitive      4 SENSITIVEThis is a modified FDA-approved test that has been validated and its performance characteristics determined by the reporting laboratory.  This laboratory is certified under the Clinical Laboratory Improvement Amendments CLIA as qualified to perform high complexity clinical laboratory testing.    ERTAPENEM Value in next row Sensitive      4 SENSITIVEThis is a modified FDA-approved test that has been validated and its performance characteristics determined by the reporting laboratory.  This laboratory is certified under the Clinical Laboratory Improvement Amendments CLIA as qualified to perform high  complexity clinical laboratory testing.    CEFTRIAXONE  Value in next row Sensitive      4 SENSITIVEThis is a modified FDA-approved test that has been validated and its performance characteristics determined by the reporting laboratory.  This laboratory is certified under the Clinical Laboratory Improvement Amendments CLIA as qualified to perform high complexity clinical laboratory testing.    CIPROFLOXACIN Value in next row Resistant      4 SENSITIVEThis is a modified FDA-approved test that has been validated and its performance characteristics determined by the reporting laboratory.  This laboratory is certified under the Clinical Laboratory Improvement Amendments CLIA as qualified to perform high complexity clinical laboratory testing.    GENTAMICIN Value in next row Resistant      4 SENSITIVEThis is a modified FDA-approved test that has been validated and its performance characteristics determined by the reporting laboratory.  This laboratory is certified under the Clinical Laboratory  Improvement Amendments CLIA as qualified to perform high complexity clinical laboratory testing.    NITROFURANTOIN Value in next row Resistant      4 SENSITIVEThis is a modified FDA-approved test that has been validated and its performance characteristics determined by the reporting laboratory.  This laboratory is certified under the Clinical Laboratory Improvement Amendments CLIA as qualified to perform high complexity clinical laboratory testing.    TRIMETH/SULFA Value in next row Sensitive      4 SENSITIVEThis is a modified FDA-approved test that has been validated and its performance characteristics determined by the reporting laboratory.  This laboratory is certified under the Clinical Laboratory Improvement Amendments CLIA as qualified to perform high complexity clinical laboratory testing.    AMPICILLIN/SULBACTAM Value in next row Resistant      4 SENSITIVEThis is a modified FDA-approved test that has been  validated and its performance characteristics determined by the reporting laboratory.  This laboratory is certified under the Clinical Laboratory Improvement Amendments CLIA as qualified to perform high complexity clinical laboratory testing.    PIP/TAZO Value in next row Sensitive ug/mL     16 SENSITIVEThis is a modified FDA-approved test that has been validated and its performance characteristics determined by the reporting laboratory.  This laboratory is certified under the Clinical Laboratory Improvement Amendments CLIA as qualified to perform high complexity clinical laboratory testing.    MEROPENEM Value in next row Sensitive      16 SENSITIVEThis is a modified FDA-approved test that has been validated and its performance characteristics determined by the reporting laboratory.  This laboratory is certified under the Clinical Laboratory Improvement Amendments CLIA as qualified to perform high complexity clinical laboratory testing.    * >=100,000 COLONIES/mL KLEBSIELLA PNEUMONIAE  Blood Culture (routine x 2)     Status: None (Preliminary result)   Collection Time: 11/20/23  8:26 AM   Specimen: BLOOD  Result Value Ref Range Status   Specimen Description BLOOD BLOOD RIGHT WRIST  Final   Special Requests   Final    BOTTLES DRAWN AEROBIC AND ANAEROBIC Blood Culture results may not be optimal due to an inadequate volume of blood received in culture bottles   Culture   Final    NO GROWTH 2 DAYS Performed at [facility], [address] [place], [place] [address]    Report Status PENDING  Incomplete  MRSA Next Gen by PCR, Nasal     Status: None   Collection Time: 11/20/23  8:47 AM   Specimen: Nasal Mucosa; Nasal Swab  Result Value Ref Range Status   MRSA by PCR Next Gen NOT DETECTED NOT DETECTED Final    Comment: (NOTE) The GeneXpert MRSA Assay (FDA approved for NASAL specimens only), is one component of a comprehensive MRSA colonization surveillance program. It is not intended to  diagnose MRSA infection nor to guide or monitor treatment for MRSA infections. Test performance is not FDA approved in patients less than 63 years old. Performed at [facility], [address] [place], [place] [address]   Respiratory (~20 pathogens) panel by PCR     Status: Abnormal   Collection Time: 11/21/23  8:47 AM   Specimen: Nasopharyngeal Swab; Respiratory  Result Value Ref Range Status   Adenovirus NOT DETECTED NOT DETECTED Final   Coronavirus 229E NOT DETECTED NOT DETECTED Final    Comment: (NOTE) The Coronavirus on the Respiratory Panel, DOES NOT test for the novel  Coronavirus (2019 nCoV)    Coronavirus HKU1 NOT DETECTED NOT DETECTED Final  Coronavirus NL63 NOT DETECTED NOT DETECTED Final   Coronavirus OC43 NOT DETECTED NOT DETECTED Final   Metapneumovirus DETECTED (A) NOT DETECTED Final   Rhinovirus / Enterovirus NOT DETECTED NOT DETECTED Final   Influenza A NOT DETECTED NOT DETECTED Final   Influenza B NOT DETECTED NOT DETECTED Final   Parainfluenza Virus 1 NOT DETECTED NOT DETECTED Final   Parainfluenza Virus 2 NOT DETECTED NOT DETECTED Final   Parainfluenza Virus 3 NOT DETECTED NOT DETECTED Final   Parainfluenza Virus 4 NOT DETECTED NOT DETECTED Final   Respiratory Syncytial Virus NOT DETECTED NOT DETECTED Final   Bordetella pertussis NOT DETECTED NOT DETECTED Final   Bordetella Parapertussis NOT DETECTED NOT DETECTED Final   Chlamydophila pneumoniae NOT DETECTED NOT DETECTED Final   Mycoplasma pneumoniae NOT DETECTED NOT DETECTED Final    Comment: Performed at [facility], [address], [place], [place] [address]         Radiology Studies: No results found.      Scheduled Meds:  apixaban   5 mg Oral BID   atorvastatin   20 mg Oral Daily   dextromethorphan -guaiFENesin   1 tablet Oral BID   doxycycline   100 mg Oral Q12H   furosemide   20 mg Intravenous BID   glipiZIDE   2.5 mg Oral Daily   ipratropium-albuterol   3 mL Nebulization TID    levETIRAcetam   1,000 mg Oral BID   lisinopril   2.5 mg Oral Daily   pseudoephedrine   120 mg Oral BID   QUEtiapine   25-50 mg Oral QHS   Continuous Infusions:  ceFEPime  (MAXIPIME ) IV 2 g (11/22/23 0900)     LOS: 2 days       [name], MD [facility] Pager [phone]  If 7PM-7AM, please contact night-coverage [email or web address] 11/22/2023, 11:45 AM

## 2023-11-23 DIAGNOSIS — A419 Sepsis, unspecified organism: Secondary | ICD-10-CM | POA: Diagnosis not present

## 2023-11-23 LAB — BLOOD GAS, VENOUS
Bicarbonate: 31.3 mmol/L — ABNORMAL HIGH (ref 20.0–28.0)
O2 Saturation: 41.2 mmol/L — AB (ref 0.0–2.0)
Patient temperature: 37
Patient temperature: 41.2
pCO2, Ven: 58 mmHg (ref 44–60)
pH, Ven: 7.34 (ref 7.25–7.43)
pO2, Ven: <31.3 mmol/L — CL (ref 32–45)

## 2023-11-23 LAB — BASIC METABOLIC PANEL WITH GFR
Anion gap: 10 (ref 5–15)
BUN: 38 mg/dL — ABNORMAL HIGH (ref 8–23)
CO2: 29 mmol/L (ref 22–32)
Calcium: 8.3 mg/dL — ABNORMAL LOW (ref 8.9–10.3)
Chloride: 105 mmol/L (ref 98–111)
Creatinine, Ser: 1.42 mg/dL — ABNORMAL HIGH (ref 0.44–1.00)
GFR, Estimated: 38 mL/min — ABNORMAL LOW (ref >60)
Glucose, Bld: 139 mg/dL — ABNORMAL HIGH (ref 70–99)
Potassium: 3.4 mmol/L — ABNORMAL LOW (ref 3.5–5.1)
Sodium: 144 mmol/L (ref 135–145)

## 2023-11-23 LAB — CBC
HCT: 27.1 % — ABNORMAL LOW (ref 36.0–46.0)
Hemoglobin: 8.8 g/dL — ABNORMAL LOW (ref 12.0–15.0)
MCH: 30.4 pg (ref 26.0–34.0)
MCHC: 32.5 g/dL (ref 30.0–36.0)
MCV: 93.8 fL (ref 80.0–100.0)
Platelets: 166 K/uL (ref 150–400)
RBC: 2.89 MIL/uL — ABNORMAL LOW (ref 3.87–5.11)
RDW: 13.4 % (ref 11.5–15.5)
WBC: 4.5 K/uL (ref 4.0–10.5)
nRBC: 0 % (ref 0.0–0.2)

## 2023-11-23 MED ORDER — IPRATROPIUM-ALBUTEROL 0.5-2.5 (3) MG/3ML IN SOLN
3.0000 mL | Freq: Two times a day (BID) | RESPIRATORY_TRACT | Status: DC
  Administered 2023-11-23: 3 mL via RESPIRATORY_TRACT
  Filled 2023-11-23: qty 3

## 2023-11-23 NOTE — Progress Notes (Signed)
 PROGRESS NOTE    Traci Carter  FMW:[id number] DOB: [birth year] DOA: 11/20/2023 PCP: Patient, No Pcp Per   Assessment & Plan:   Principal Problem:   Sepsis (HCC) Active Problems:   Essential hypertension   Paroxysmal atrial fibrillation (HCC)   Hyperlipidemia associated with type 2 diabetes mellitus (HCC)   OSA (obstructive sleep apnea)   Diabetes mellitus type 2, uncomplicated (HCC)   Hyperlipemia  Assessment and Plan: Sepsis: see Dr. Lamount notes on how pt met sepsis criteria. Likely secondary to pneumonia & UTI  Multifocal pneumonia: positive for metapneumovirus. Possible superimposed bacteria infection, Continue on IV rocephin , doxy, bronchodilators & encourage incentive spirometry   UTI: urine cx growing klebsiella. Continue on IV rocephin    Thrombocytopenia: resolved    CHF: unknown systolic vs diastolic vs combined. Monitor I/Os. Continue on home dose of lasix   DM2: well controlled, HbA1c 5.7. Continue on SSI w/ accuchecks   Seizure disorder: continue on home dose of keppra   HTN: continue on lasix   HLD: continue on statin   Likely PAF: continue on eliquis        DVT prophylaxis: eliquis  Code Status: full  Family Communication: called pt's daughter, [name], no answer so I left a voicemail Disposition Plan: possibly d/c to SNF  Level of care: Telemetry Medical Consultants:    Procedures:  Antimicrobials: rocephin , doxy   Subjective: Pt does not want to answer any questions today and wants to just rest.  Objective: Vitals:   11/22/23 2007 11/23/23 0015 11/23/23 0419 11/23/23 0842  BP:  95/68 110/65 (!) 104/48  Pulse:  100 85 83  Resp:  15 16 18   Temp:  98 F (36.7 C) 97.7 F (36.5 C) 98 F (36.7 C)  TempSrc:      SpO2: 95% 92% 97% (!) 89%  Weight:      Height:        Intake/Output Summary (Last 24 hours) at 11/23/2023 0936 Last data filed at 11/23/2023 0422 Gross per 24 hour  Intake --  Output 1125 ml  Net -1125 ml    Filed Weights   11/20/23 0753 11/20/23 1752  Weight: 81.6 kg 99.7 kg    Examination:  General exam: appears depressed Respiratory system: course breath sounds b/l  Cardiovascular system: S1/S2+. No rubs or clicks  Gastrointestinal system: abd is soft, NT, obese & hypoactive bowel sounds  Central nervous system: alert & awake. Moves all extremities  Psychiatry: Judgement and insight appears not at baseline. Flat mood and affect    Data Reviewed: I have personally reviewed following labs and imaging studies  CBC: Recent Labs  Lab 11/20/23 0850 11/21/23 0344 11/23/23 0349  WBC 5.6 4.2 4.5  NEUTROABS 3.5  --   --   HGB 9.6* 9.2* 8.8*  HCT 29.5* 28.1* 27.1*  MCV 94.2 94.6 93.8  PLT 134* 123* 166   Basic Metabolic Panel: Recent Labs  Lab 11/20/23 0850 11/21/23 0344 11/23/23 0349  NA 143 143 144  K 3.4* 4.0 3.4*  CL 100 105 105  CO2 26 27 29   GLUCOSE 150* 84 139*  BUN 35* 36* 38*  CREATININE 1.70* 1.47* 1.42*  CALCIUM  8.5* 8.2* 8.3*   GFR: Estimated Creatinine Clearance: 37.9 mL/min (A) (by C-G formula based on SCr of 1.42 mg/dL (H)). Liver Function Tests: Recent Labs  Lab 11/20/23 0850 11/21/23 0344  AST 25 21  ALT 13 12  ALKPHOS 71 65  BILITOT 0.6 0.8  PROT 6.2* 5.9*  ALBUMIN  2.8* 2.7*   No results for  input(s): LIPASE, AMYLASE in the last 168 hours. No results for input(s): AMMONIA in the last 168 hours. Coagulation Profile: Recent Labs  Lab 11/20/23 0850 11/21/23 0344  INR 1.9* 1.6*   Cardiac Enzymes: No results for input(s): CKTOTAL, CKMB, CKMBINDEX, TROPONINI in the last 168 hours. BNP (last 3 results) No results for input(s): PROBNP in the last 8760 hours. HbA1C: No results for input(s): HGBA1C in the last 72 hours. CBG: Recent Labs  Lab 11/21/23 2015  GLUCAP 125*   Lipid Profile: No results for input(s): CHOL, HDL, LDLCALC, TRIG, CHOLHDL, LDLDIRECT in the last 72 hours. Thyroid Function Tests: No  results for input(s): TSH, T4TOTAL, FREET4, T3FREE, THYROIDAB in the last 72 hours. Anemia Panel: No results for input(s): VITAMINB12, FOLATE, FERRITIN, TIBC, IRON, RETICCTPCT in the last 72 hours. Sepsis Labs: Recent Labs  Lab 11/20/23 9178 11/20/23 1027  LATICACIDVEN 2.3* 1.4    Recent Results (from the past 240 hours)  Blood Culture (routine x 2)     Status: None (Preliminary result)   Collection Time: 11/20/23  8:20 AM   Specimen: BLOOD  Result Value Ref Range Status   Specimen Description BLOOD LEFT ANTECUBITAL  Final   Special Requests   Final    BOTTLES DRAWN AEROBIC AND ANAEROBIC Blood Culture adequate volume   Culture   Final    NO GROWTH 2 DAYS Performed at [facility], [address] [place], [place] [address]    Report Status PENDING  Incomplete  Resp panel by RT-PCR (RSV, Flu A&B, Covid) Anterior Nasal Swab     Status: None   Collection Time: 11/20/23  8:21 AM   Specimen: Anterior Nasal Swab  Result Value Ref Range Status   SARS Coronavirus 2 by RT PCR NEGATIVE NEGATIVE Final    Comment: (NOTE) SARS-CoV-2 target nucleic acids are NOT DETECTED.  The SARS-CoV-2 RNA is generally detectable in upper respiratory specimens during the acute phase of infection. The lowest concentration of SARS-CoV-2 viral copies this assay can detect is 138 copies/mL. A negative result does not preclude SARS-Cov-2 infection and should not be used as the sole basis for treatment or other patient management decisions. A negative result may occur with  improper specimen collection/handling, submission of specimen other than nasopharyngeal swab, presence of viral mutation(s) within the areas targeted by this assay, and inadequate number of viral copies(<138 copies/mL). A negative result must be combined with clinical observations, patient history, and epidemiological information. The expected result is Negative.  Fact Sheet for Patients:   BloggerCourse.com  Fact Sheet for Healthcare Providers:  SeriousBroker.it  This test is no t yet approved or cleared by the [place]  FDA and  has been authorized for detection and/or diagnosis of SARS-CoV-2 by FDA under an Emergency Use Authorization (EUA). This EUA will remain  in effect (meaning this test can be used) for the duration of the COVID-19 declaration under Section 564(b)(1) of the Act, 21 U.S.C.section 360bbb-3(b)(1), unless the authorization is terminated  or revoked sooner.       Influenza A by PCR NEGATIVE NEGATIVE Final   Influenza B by PCR NEGATIVE NEGATIVE Final    Comment: (NOTE) The Xpert Xpress SARS-CoV-2/FLU/RSV plus assay is intended as an aid in the diagnosis of influenza from Nasopharyngeal swab specimens and should not be used as a sole basis for treatment. Nasal washings and aspirates are unacceptable for Xpert Xpress SARS-CoV-2/FLU/RSV testing.  Fact Sheet for Patients: BloggerCourse.com  Fact Sheet for Healthcare Providers: SeriousBroker.it  This test is not  yet approved or cleared by the [place]  FDA and has been authorized for detection and/or diagnosis of SARS-CoV-2 by FDA under an Emergency Use Authorization (EUA). This EUA will remain in effect (meaning this test can be used) for the duration of the COVID-19 declaration under Section 564(b)(1) of the Act, 21 U.S.C. section 360bbb-3(b)(1), unless the authorization is terminated or revoked.     Resp Syncytial Virus by PCR NEGATIVE NEGATIVE Final    Comment: (NOTE) Fact Sheet for Patients: BloggerCourse.com  Fact Sheet for Healthcare Providers: SeriousBroker.it  This test is not yet approved or cleared by the [place]  FDA and has been authorized for detection and/or diagnosis of SARS-CoV-2 by FDA under an Emergency Use  Authorization (EUA). This EUA will remain in effect (meaning this test can be used) for the duration of the COVID-19 declaration under Section 564(b)(1) of the Act, 21 U.S.C. section 360bbb-3(b)(1), unless the authorization is terminated or revoked.  Performed at [facility], [address], [place], [place] [address]   Urine Culture     Status: Abnormal   Collection Time: 11/20/23  8:21 AM   Specimen: Urine, Random  Result Value Ref Range Status   Specimen Description   Final    URINE, RANDOM Performed at [facility], [address] [place], [place] [address]    Special Requests   Final    NONE Reflexed from [id number] Performed at [facility], [address] [place], [place] [address]    Culture >=100,000 COLONIES/mL KLEBSIELLA PNEUMONIAE (A)  Final   Report Status 11/22/2023 FINAL  Final   Organism ID, Bacteria KLEBSIELLA PNEUMONIAE (A)  Final      Susceptibility   Klebsiella pneumoniae - MIC*    AMPICILLIN >=32 RESISTANT Resistant     CEFAZOLIN  (URINE) Value in next row Sensitive      4 SENSITIVEThis is a modified FDA-approved test that has been validated and its performance characteristics determined by the reporting laboratory.  This laboratory is certified under the Clinical Laboratory Improvement Amendments CLIA as qualified to perform high complexity clinical laboratory testing.    CEFEPIME  Value in next row Sensitive      4 SENSITIVEThis is a modified FDA-approved test that has been validated and its performance characteristics determined by the reporting laboratory.  This laboratory is certified under the Clinical Laboratory Improvement Amendments CLIA as qualified to perform high complexity clinical laboratory testing.    ERTAPENEM Value in next row Sensitive      4 SENSITIVEThis is a modified FDA-approved test that has been validated and its performance characteristics determined by the reporting laboratory.  This laboratory is certified  under the Clinical Laboratory Improvement Amendments CLIA as qualified to perform high complexity clinical laboratory testing.    CEFTRIAXONE  Value in next row Sensitive      4 SENSITIVEThis is a modified FDA-approved test that has been validated and its performance characteristics determined by the reporting laboratory.  This laboratory is certified under the Clinical Laboratory Improvement Amendments CLIA as qualified to perform high complexity clinical laboratory testing.    CIPROFLOXACIN Value in next row Resistant      4 SENSITIVEThis is a modified FDA-approved test that has been validated and its performance characteristics determined by the reporting laboratory.  This laboratory is certified under the Clinical Laboratory Improvement Amendments CLIA as qualified to perform high complexity clinical laboratory testing.    GENTAMICIN Value in next row Resistant      4 SENSITIVEThis is a modified FDA-approved  test that has been validated and its performance characteristics determined by the reporting laboratory.  This laboratory is certified under the Clinical Laboratory Improvement Amendments CLIA as qualified to perform high complexity clinical laboratory testing.    NITROFURANTOIN Value in next row Resistant      4 SENSITIVEThis is a modified FDA-approved test that has been validated and its performance characteristics determined by the reporting laboratory.  This laboratory is certified under the Clinical Laboratory Improvement Amendments CLIA as qualified to perform high complexity clinical laboratory testing.    TRIMETH/SULFA Value in next row Sensitive      4 SENSITIVEThis is a modified FDA-approved test that has been validated and its performance characteristics determined by the reporting laboratory.  This laboratory is certified under the Clinical Laboratory Improvement Amendments CLIA as qualified to perform high complexity clinical laboratory testing.    AMPICILLIN/SULBACTAM Value in next  row Resistant      4 SENSITIVEThis is a modified FDA-approved test that has been validated and its performance characteristics determined by the reporting laboratory.  This laboratory is certified under the Clinical Laboratory Improvement Amendments CLIA as qualified to perform high complexity clinical laboratory testing.    PIP/TAZO Value in next row Sensitive ug/mL     16 SENSITIVEThis is a modified FDA-approved test that has been validated and its performance characteristics determined by the reporting laboratory.  This laboratory is certified under the Clinical Laboratory Improvement Amendments CLIA as qualified to perform high complexity clinical laboratory testing.    MEROPENEM Value in next row Sensitive      16 SENSITIVEThis is a modified FDA-approved test that has been validated and its performance characteristics determined by the reporting laboratory.  This laboratory is certified under the Clinical Laboratory Improvement Amendments CLIA as qualified to perform high complexity clinical laboratory testing.    * >=100,000 COLONIES/mL KLEBSIELLA PNEUMONIAE  Blood Culture (routine x 2)     Status: None (Preliminary result)   Collection Time: 11/20/23  8:26 AM   Specimen: BLOOD  Result Value Ref Range Status   Specimen Description BLOOD BLOOD RIGHT WRIST  Final   Special Requests   Final    BOTTLES DRAWN AEROBIC AND ANAEROBIC Blood Culture results may not be optimal due to an inadequate volume of blood received in culture bottles   Culture   Final    NO GROWTH 2 DAYS Performed at [facility], [address], [place], [place] [address]    Report Status PENDING  Incomplete  MRSA Next Gen by PCR, Nasal     Status: None   Collection Time: 11/20/23  8:47 AM   Specimen: Nasal Mucosa; Nasal Swab  Result Value Ref Range Status   MRSA by PCR Next Gen NOT DETECTED NOT DETECTED Final    Comment: (NOTE) The GeneXpert MRSA Assay (FDA approved for NASAL specimens only), is one component  of a comprehensive MRSA colonization surveillance program. It is not intended to diagnose MRSA infection nor to guide or monitor treatment for MRSA infections. Test performance is not FDA approved in patients less than 57 years old. Performed at [facility], [address] [place], [place] [address]   Respiratory (~20 pathogens) panel by PCR     Status: Abnormal   Collection Time: 11/21/23  8:47 AM   Specimen: Nasopharyngeal Swab; Respiratory  Result Value Ref Range Status   Adenovirus NOT DETECTED NOT DETECTED Final   Coronavirus 229E NOT DETECTED NOT DETECTED Final    Comment: (NOTE) The Coronavirus on  the Respiratory Panel, DOES NOT test for the novel  Coronavirus (2019 nCoV)    Coronavirus HKU1 NOT DETECTED NOT DETECTED Final   Coronavirus NL63 NOT DETECTED NOT DETECTED Final   Coronavirus OC43 NOT DETECTED NOT DETECTED Final   Metapneumovirus DETECTED (A) NOT DETECTED Final   Rhinovirus / Enterovirus NOT DETECTED NOT DETECTED Final   Influenza A NOT DETECTED NOT DETECTED Final   Influenza B NOT DETECTED NOT DETECTED Final   Parainfluenza Virus 1 NOT DETECTED NOT DETECTED Final   Parainfluenza Virus 2 NOT DETECTED NOT DETECTED Final   Parainfluenza Virus 3 NOT DETECTED NOT DETECTED Final   Parainfluenza Virus 4 NOT DETECTED NOT DETECTED Final   Respiratory Syncytial Virus NOT DETECTED NOT DETECTED Final   Bordetella pertussis NOT DETECTED NOT DETECTED Final   Bordetella Parapertussis NOT DETECTED NOT DETECTED Final   Chlamydophila pneumoniae NOT DETECTED NOT DETECTED Final   Mycoplasma pneumoniae NOT DETECTED NOT DETECTED Final    Comment: Performed at [facility], [address] [place], [place] [address]         Radiology Studies: No results found.      Scheduled Meds:  apixaban   5 mg Oral BID   atorvastatin   20 mg Oral Daily   doxycycline   100 mg Oral Q12H   furosemide   20 mg Intravenous BID   glipiZIDE   2.5 mg Oral Daily    ipratropium-albuterol   3 mL Nebulization TID   levETIRAcetam   1,000 mg Oral BID   lisinopril   2.5 mg Oral Daily   QUEtiapine   25-50 mg Oral QHS   Continuous Infusions:  cefTRIAXone  (ROCEPHIN )  IV 1 g (11/22/23 2018)     LOS: 3 days       [name], MD [facility] Pager [phone]  If 7PM-7AM, please contact night-coverage [email or web address] 11/23/2023, 9:36 AM

## 2023-11-23 NOTE — Progress Notes (Addendum)
 Physical Therapy Treatment Patient Details Name: Traci Carter: [id number] DOB: 1945-11-08 Today's Date: 11/23/2023   History of Present Illness Patient is a 78 year old female with sepsis likely due to pneumonia/UTI. PMH: atrial fibrillation, pacemaker for complete heart block, DM, CAD, HTN, ICH, CVA, seizures, fibromyalgia, anxiety/depression, OSA    PT Comments  Patient continues to need maximal assistance to sit up on edge of bed. Sitting balance improves with increased sitting time. She initially was agreeable to try standing, however becomes anxious and refusing to attempt standing despite gentle encouragement. She does express fear of falling. Recommend to continue PT to maximize independence and decrease caregiver burden. Rehabilitation < 3 hours/day recommended after this hospital stay.    If plan is discharge home, recommend the following: Two people to help with walking and/or transfers;A lot of help with bathing/dressing/bathroom;Assistance with cooking/housework;Assist for transportation;Help with stairs or ramp for entrance;Direct supervision/assist for medications management   Can travel by private vehicle     No  Equipment Recommendations  Hoyer lift;Wheelchair (measurements PT);BSC/3in1;Hospital bed (if going directly home)    Recommendations for Other Services OT consult     Precautions / Restrictions Precautions Precautions: Fall Recall of Precautions/Restrictions: Impaired Restrictions Weight Bearing Restrictions Per Provider Order: No     Mobility  Bed Mobility Overal bed mobility: Needs Assistance Bed Mobility: Sit to Supine, Supine to Sit     Supine to sit: Max assist Sit to supine: Min assist   General bed mobility comments: cues for sequencing and task initiation. patient with increased participation with returning to bed.    Transfers                   General transfer comment: patient initially says she is agreeable to try  standing. when therapist got rolling walker out, patient adamently refusing to try standing. patient does express a fear of falls. patient then refusing initially to get back to bed but was agreeable after education on fall risk. recommend staff or family present if patient is to remain seated on edge of bed for safety.    Ambulation/Gait                   Stairs             Wheelchair Mobility     Tilt Bed    Modified Rankin (Stroke Patients Only)       Balance Overall balance assessment: Needs assistance Sitting-balance support: Feet unsupported Sitting balance-Leahy Scale: Fair Sitting balance - Comments: initial posterior lean with improvement with increased time and cues for anterior weight shifting Postural control: Posterior lean                                  Communication Communication Communication: Impaired Factors Affecting Communication: Difficulty expressing self  Cognition Arousal: Alert Behavior During Therapy: Anxious   PT - Cognitive impairments: No family/caregiver present to determine baseline, Safety/Judgement, Sequencing                       PT - Cognition Comments: The patient is initially agreeable to PT. She becomes anxious with any talk of standing and refused to participate with therapy further without maximal encouragement. Following commands: Impaired Following commands impaired: Follows one step commands inconsistently    Cueing Cueing Techniques: Verbal cues, Tactile cues, Visual cues  Exercises      General Comments  Pertinent Vitals/Pain Pain Assessment Pain Assessment: No/denies pain    Home Living                          Prior Function            PT Goals (current goals can now be found in the care plan section) Acute Rehab PT Goals Patient Stated Goal: to feel better PT Goal Formulation: With patient Time For Goal Achievement: 12/05/23 Potential to Achieve  Goals: Fair Progress towards PT goals: Not progressing toward goals - comment    Frequency    Min 1X/week      PT Plan      Co-evaluation              AM-PAC PT 6 Clicks Mobility   Outcome Measure  Help needed turning from your back to your side while in a flat bed without using bedrails?: A Lot Help needed moving from lying on your back to sitting on the side of a flat bed without using bedrails?: A Lot Help needed moving to and from a bed to a chair (including a wheelchair)?: Total Help needed standing up from a chair using your arms (e.g., wheelchair or bedside chair)?: Total Help needed to walk in hospital room?: Total Help needed climbing 3-5 steps with a railing? : Total 6 Click Score: 8    End of Session Equipment Utilized During Treatment: Oxygen Activity Tolerance: Other (comment) (limited by willingness to participte) Patient left: in bed;with call bell/phone within reach;with bed alarm set Nurse Communication: Mobility status PT Visit Diagnosis: Muscle weakness (generalized) (M62.81);Unsteadiness on feet (R26.81)     Time: 9187-9157 PT Time Calculation (min) (ACUTE ONLY): 30 min  Charges:    $Therapeutic Activity: 23-37 mins PT General Charges $$ ACUTE PT VISIT: 1 Visit                    Randine Essex, PT, MPT    Randine LULLA Essex 11/23/2023, 9:06 AM

## 2023-11-23 NOTE — TOC Initial Note (Signed)
 Transition of Care [facility]) - Initial/Assessment Note    Patient Details  Name: Traci Carter MRN: [id number] Date of Birth: 09/05/1945  Transition of Care [facility]) CM/SW Contact:    Alfonso Rummer, LCSW Phone Number: 11/23/2023, 4:42 PM  Clinical Narrative:     Pt requested LCSW to spk with daughter [name]. Pt does not have a pcp. DME: walker, wheelchair and lives alone. Spouse just passed away 11-20-23. Pt arrived from [facility].                 Barriers to Discharge: Continued Medical Work up   Patient Goals and CMS Choice            Expected Discharge Plan and Services       Living arrangements for the past 2 months: Single Family Home                                      Prior Living Arrangements/Services Living arrangements for the past 2 months: Single Family Home Lives with:: Adult Children   Do you feel safe going back to the place where you live?: Yes        Care giver support system in place?: Yes (comment)      Activities of Daily Living   ADL Screening (condition at time of admission) Independently performs ADLs?: No Does the patient have a NEW difficulty with bathing/dressing/toileting/self-feeding that is expected to last >3 days?: No Does the patient have a NEW difficulty with getting in/out of bed, walking, or climbing stairs that is expected to last >3 days?: No Does the patient have a NEW difficulty with communication that is expected to last >3 days?: No Is the patient deaf or have difficulty hearing?: No Does the patient have difficulty seeing, even when wearing glasses/contacts?: No Does the patient have difficulty concentrating, remembering, or making decisions?: No  Permission Sought/Granted Permission sought to share information with : Family Supports          Permission granted to share info w Relationship: Christy     Emotional Assessment Appearance:: Appears stated age     Orientation: : Oriented to Self       Admission diagnosis:  Acute respiratory failure with hypoxia (HCC) [J96.01] Sepsis (HCC) [A41.9] Pneumonia due to infectious organism, unspecified laterality, unspecified part of lung [J18.9] Sepsis, due to unspecified organism, unspecified whether acute organ dysfunction present [facility]) [A41.9] Patient Active Problem List   Diagnosis Date Noted   Sepsis (HCC) 11/20/2023   Acute renal failure with tubular necrosis (HCC) 10/17/2023   Hypovolemic shock (HCC) 10/17/2023   Severe sepsis with septic shock (HCC) 10/17/2023   Metabolic acidosis 10/17/2023   Altered mental status 10/16/2023   Ambien  accidental overdose, initial encounter 02/09/2021   Hypokalemia 02/09/2021   Hypocalcemia 02/09/2021   Acquired thrombophilia (HCC) 01/16/2021   UTI (urinary tract infection) 01/14/2021   Acute metabolic encephalopathy 01/14/2021   Gout flare 01/14/2021   Hyperglycemia due to type 2 diabetes mellitus (HCC) 01/14/2021   Atrial fibrillation, chronic (HCC) 01/14/2021   HTN (hypertension) 01/14/2021   History of CVA (cerebrovascular accident) 01/14/2021   Fracture of femoral condyle, right, closed (HCC) 10/17/2020   Generalized weakness 09/16/2020   Type II diabetes mellitus with renal manifestations (HCC) 09/16/2020   CKD (chronic kidney disease), stage IIIa 09/16/2020   Depression with anxiety 09/16/2020   Fall 09/16/2020   UTI (urinary tract infection) 09/16/2020  Seizure (HCC) 09/16/2020   Diabetes mellitus type 2, uncomplicated (HCC) 09/10/2019   Chest pain with low risk for cardiac etiology 12/27/2018   SOB (shortness of breath) on exertion 12/27/2018   Paroxysmal atrial fibrillation (HCC) 10/16/2018   Hyperlipidemia associated with type 2 diabetes mellitus (HCC) 10/16/2018   Pacemaker 10/16/2018   History of CVA (cerebrovascular accident) 10/16/2018   OSA (obstructive sleep apnea) 10/16/2018   Fibromyalgia 10/16/2018   CAD (coronary artery disease) 10/16/2018   Diarrhea 06/09/2017    Hyperlipemia 08/15/2013   ICH (intracerebral hemorrhage) (HCC) 07/21/2012   Diabetes (HCC) 07/21/2012   Essential hypertension 07/21/2012   CVA (cerebral vascular accident) (HCC) 07/20/2012   PCP:  Patient, No Pcp Per Pharmacy:   [id number] [place], [place] - [address] AT [facility] OF [address] [place] [address] Phone: [phone] Fax: [phone]     Social Drivers of Health (SDOH) Social History: SDOH Screenings   Food Insecurity: No Food Insecurity (11/20/2023)  Housing: Low Risk  (11/20/2023)  Transportation Needs: No Transportation Needs (11/20/2023)  Utilities: Not At Risk (11/20/2023)  Financial Resource Strain: Low Risk  (09/25/2020)   Received from [facility]  Social Connections: Moderately Isolated (11/20/2023)  Tobacco Use: Low Risk  (11/20/2023)   SDOH Interventions:     Readmission Risk Interventions    10/17/2023    3:58 PM  Readmission Risk Prevention Plan  PCP or Specialist Appt within 3-5 Days Complete  Social Work Consult for Recovery Care Planning/Counseling Complete  Palliative Care Screening Not Applicable

## 2023-11-23 NOTE — Care Management Important Message (Signed)
 Important Message  Patient Details  Name: Traci Carter MRN: [id number] Date of Birth: 06-Jul-1945   Important Message Given:  Yes - Medicare IM     Bettina Warn W, CMA 11/23/2023, 10:55 AM

## 2023-11-23 NOTE — Plan of Care (Signed)
  Problem: Clinical Measurements: Goal: Ability to maintain clinical measurements within normal limits will improve Outcome: Progressing   Problem: Activity: Goal: Risk for activity intolerance will decrease Outcome: Progressing   Problem: Elimination: Goal: Will not experience complications related to urinary retention Outcome: Progressing   Problem: Skin Integrity: Goal: Risk for impaired skin integrity will decrease Outcome: Progressing

## 2023-11-23 NOTE — Plan of Care (Signed)
  Problem: Education: Goal: Knowledge of General Education information will improve Description: Including pain rating scale, medication(s)/side effects and non-pharmacologic comfort measures 11/23/2023 1137 by Kateri Caprice HERO, RN Outcome: Progressing 11/23/2023 1136 by Kateri Caprice HERO, RN Outcome: Progressing   Problem: Health Behavior/Discharge Planning: Goal: Ability to manage health-related needs will improve 11/23/2023 1137 by Kateri Caprice HERO, RN Outcome: Progressing 11/23/2023 1136 by Kateri Caprice HERO, RN Outcome: Progressing   Problem: Clinical Measurements: Goal: Ability to maintain clinical measurements within normal limits will improve 11/23/2023 1137 by Kateri Caprice HERO, RN Outcome: Progressing 11/23/2023 1136 by Kateri Caprice HERO, RN Outcome: Progressing   Problem: Activity: Goal: Risk for activity intolerance will decrease 11/23/2023 1137 by Kateri Caprice HERO, RN Outcome: Progressing 11/23/2023 1136 by Kateri Caprice HERO, RN Outcome: Progressing   Problem: Coping: Goal: Level of anxiety will decrease 11/23/2023 1137 by Kateri Caprice HERO, RN Outcome: Progressing 11/23/2023 1136 by Kateri Caprice HERO, RN Outcome: Progressing   Problem: Elimination: Goal: Will not experience complications related to bowel motility 11/23/2023 1137 by Kateri Caprice HERO, RN Outcome: Progressing 11/23/2023 1136 by Kateri Caprice HERO, RN Outcome: Progressing   Problem: Safety: Goal: Ability to remain free from injury will improve Outcome: Progressing   Problem: Skin Integrity: Goal: Risk for impaired skin integrity will decrease Outcome: Progressing   Problem: Activity: Goal: Ability to tolerate increased activity will improve Outcome: Progressing   Problem: Clinical Measurements: Goal: Ability to maintain a body temperature in the normal range will improve 11/23/2023 1137 by Kateri Caprice HERO, RN Outcome: Progressing 11/23/2023 1136 by Kateri Caprice HERO, RN Outcome: Progressing   Problem: Respiratory: Goal: Ability  to maintain adequate ventilation will improve 11/23/2023 1137 by Kateri Caprice HERO, RN Outcome: Progressing 11/23/2023 1136 by Kateri Caprice HERO, RN Outcome: Progressing

## 2023-11-23 NOTE — Plan of Care (Signed)
  Problem: Education: Goal: Knowledge of General Education information will improve Description: Including pain rating scale, medication(s)/side effects and non-pharmacologic comfort measures Outcome: Progressing   Problem: Health Behavior/Discharge Planning: Goal: Ability to manage health-related needs will improve Outcome: Progressing   Problem: Clinical Measurements: Goal: Ability to maintain clinical measurements within normal limits will improve Outcome: Progressing   Problem: Activity: Goal: Risk for activity intolerance will decrease Outcome: Progressing   Problem: Coping: Goal: Level of anxiety will decrease Outcome: Progressing   Problem: Elimination: Goal: Will not experience complications related to bowel motility Outcome: Progressing   Problem: Clinical Measurements: Goal: Ability to maintain a body temperature in the normal range will improve Outcome: Progressing   Problem: Respiratory: Goal: Ability to maintain adequate ventilation will improve Outcome: Progressing

## 2023-11-24 ENCOUNTER — Inpatient Hospital Stay

## 2023-11-24 DIAGNOSIS — A419 Sepsis, unspecified organism: Secondary | ICD-10-CM | POA: Diagnosis not present

## 2023-11-24 LAB — BASIC METABOLIC PANEL WITH GFR
Anion gap: 16 — ABNORMAL HIGH (ref 5–15)
BUN: 38 mg/dL — ABNORMAL HIGH (ref 8–23)
CO2: 28 mmol/L (ref 22–32)
Calcium: 8.3 mg/dL — ABNORMAL LOW (ref 8.9–10.3)
Chloride: 101 mmol/L (ref 98–111)
Creatinine, Ser: 1.24 mg/dL — ABNORMAL HIGH (ref 0.44–1.00)
GFR, Estimated: 45 mL/min — ABNORMAL LOW (ref >60)
Glucose, Bld: 154 mg/dL — ABNORMAL HIGH (ref 70–99)
Potassium: 3 mmol/L — ABNORMAL LOW (ref 3.5–5.1)
Sodium: 145 mmol/L (ref 135–145)

## 2023-11-24 LAB — CBC
HCT: 28.5 % — ABNORMAL LOW (ref 36.0–46.0)
Hemoglobin: 9.6 g/dL — ABNORMAL LOW (ref 12.0–15.0)
MCH: 30.8 pg (ref 26.0–34.0)
MCHC: 33.7 g/dL (ref 30.0–36.0)
MCV: 91.3 fL (ref 80.0–100.0)
Platelets: 227 K/uL (ref 150–400)
RBC: 3.12 MIL/uL — ABNORMAL LOW (ref 3.87–5.11)
RDW: 13.6 % (ref 11.5–15.5)
WBC: 5.6 K/uL (ref 4.0–10.5)
nRBC: 0 % (ref 0.0–0.2)

## 2023-11-24 LAB — LEGIONELLA PNEUMOPHILA SEROGP 1 UR AG: L. pneumophila Serogp 1 Ur Ag: NEGATIVE

## 2023-11-24 MED ORDER — POTASSIUM CHLORIDE CRYS ER 20 MEQ PO TBCR
40.0000 meq | EXTENDED_RELEASE_TABLET | Freq: Once | ORAL | Status: AC
  Administered 2023-11-24: 40 meq via ORAL
  Filled 2023-11-24: qty 2

## 2023-11-24 NOTE — TOC Progression Note (Signed)
 Transition of Care [facility]) - Progression Note    Patient Details  Name: Traci Carter MRN: [id number] Date of Birth: [birth year]  [facility]) CM/SW Contact  Dalia GORMAN Fuse, RN Phone Number: 11/24/2023, 12:30 PM  Clinical Narrative:    Therapy recommending SNF, FL2 sent out in [place] Bed offers pending.     Barriers to Discharge: Continued Medical Work up               Expected Discharge Plan and Services       Living arrangements for the past 2 months: Single Family Home                                       Social Drivers of Health (SDOH) Interventions SDOH Screenings   Food Insecurity: No Food Insecurity (11/20/2023)  Housing: Low Risk  (11/20/2023)  Transportation Needs: No Transportation Needs (11/20/2023)  Utilities: Not At Risk (11/20/2023)  Financial Resource Strain: Low Risk  (09/25/2020)   Received from [facility]  Social Connections: Moderately Isolated (11/20/2023)  Tobacco Use: Low Risk  (11/20/2023)    Readmission Risk Interventions    10/17/2023    3:58 PM  Readmission Risk Prevention Plan  PCP or Specialist Appt within 3-5 Days Complete  Social Work Consult for Recovery Care Planning/Counseling Complete  Palliative Care Screening Not Applicable

## 2023-11-24 NOTE — Progress Notes (Addendum)
 PROGRESS NOTE    Traci Carter  FMW:[id number] DOB: 11-08-1945 DOA: 11/20/2023 PCP: Patient, No Pcp Per   Assessment & Plan:   Principal Problem:   Sepsis (HCC) Active Problems:   Essential hypertension   Paroxysmal atrial fibrillation (HCC)   Hyperlipidemia associated with type 2 diabetes mellitus (HCC)   OSA (obstructive sleep apnea)   Diabetes mellitus type 2, uncomplicated (HCC)   Hyperlipemia  Assessment and Plan: Sepsis: see Dr. Lamount notes on how pt met sepsis criteria. Likely secondary to pneumonia & UTI  Multifocal pneumonia: positive for metapneumovirus. Possible superimposed bacteria infection. Continue on IV rocephin , doxy, bronchodilators & encourage incentive spirometry    UTI: urine cx growing klebsiella. Continue on IV rocephin    Acute encephalopathy: etiology unclear, possible secondary to above infection. Recently lost her husband of 54 years approx 2 weeks, concern for depression. Repeat CT head ordered  Thrombocytopenia: resolved    CHF: unknown systolic vs diastolic vs combined. Monitor I/Os. Continue on home dose of lasix   DM2: well controlled, HbA1c 5.7. Continue on SSI w/ accuchecks   Seizure disorder: continue on home dose of keppra    HTN: continue on lasix    HLD: continue on statin   Likely PAF: continue on eliquis        DVT prophylaxis: eliquis  Code Status: full  Family Communication: discussed pt's care w/ pt's daughter, [name], and answered her questions  Disposition Plan: possibly d/c to SNF  Level of care: Telemetry [facility]:    Procedures:  Antimicrobials: rocephin , doxy   Subjective: Pt is confused  Objective: Vitals:   11/23/23 2345 11/24/23 0524 11/24/23 0744 11/24/23 0845  BP: 132/79 128/82  136/84  Pulse: 100 92  88  Resp: 16 20  19   Temp: 98.1 F (36.7 C) 98.6 F (37 C)  98 F (36.7 C)  TempSrc:      SpO2: 93% 92% (P) 90% 93%  Weight:      Height:        Intake/Output Summary  (Last 24 hours) at 11/24/2023 0931 Last data filed at 11/24/2023 0410 Gross per 24 hour  Intake 430 ml  Output 2250 ml  Net -1820 ml   Filed Weights   11/20/23 0753 11/20/23 1752  Weight: 81.6 kg 99.7 kg    Examination:  General exam: appears confused & depressed Respiratory system: course breath sounds b/l  Cardiovascular system: S1 & S2+. No rubs or clicks  Gastrointestinal system: abd is soft, NT, obese & hypoactive bowel sounds   Central nervous system: alert & awake. Moves all extremities  Psychiatry: judgement and insight appears at baseline. Flat mood and affect    Data Reviewed: I have personally reviewed following labs and imaging studies  CBC: Recent Labs  Lab 11/20/23 0850 11/21/23 0344 11/23/23 0349 11/24/23 0709  WBC 5.6 4.2 4.5 5.6  NEUTROABS 3.5  --   --   --   HGB 9.6* 9.2* 8.8* 9.6*  HCT 29.5* 28.1* 27.1* 28.5*  MCV 94.2 94.6 93.8 91.3  PLT 134* 123* 166 227   Basic Metabolic Panel: Recent Labs  Lab 11/20/23 0850 11/21/23 0344 11/23/23 0349 11/24/23 0709  NA 143 143 144 145  K 3.4* 4.0 3.4* 3.0*  CL 100 105 105 101  CO2 26 27 29 28   GLUCOSE 150* 84 139* 154*  BUN 35* 36* 38* 38*  CREATININE 1.70* 1.47* 1.42* 1.24*  CALCIUM  8.5* 8.2* 8.3* 8.3*   GFR: Estimated Creatinine Clearance: 43.4 mL/min (A) (by C-G formula based on SCr  of 1.24 mg/dL (H)). Liver Function Tests: Recent Labs  Lab 11/20/23 0850 11/21/23 0344  AST 25 21  ALT 13 12  ALKPHOS 71 65  BILITOT 0.6 0.8  PROT 6.2* 5.9*  ALBUMIN  2.8* 2.7*   No results for input(s): LIPASE, AMYLASE in the last 168 hours. No results for input(s): AMMONIA in the last 168 hours. Coagulation Profile: Recent Labs  Lab 11/20/23 0850 11/21/23 0344  INR 1.9* 1.6*   Cardiac Enzymes: No results for input(s): CKTOTAL, CKMB, CKMBINDEX, TROPONINI in the last 168 hours. BNP (last 3 results) No results for input(s): PROBNP in the last 8760 hours. HbA1C: No results for input(s):  HGBA1C in the last 72 hours. CBG: Recent Labs  Lab 11/21/23 2015  GLUCAP 125*   Lipid Profile: No results for input(s): CHOL, HDL, LDLCALC, TRIG, CHOLHDL, LDLDIRECT in the last 72 hours. Thyroid Function Tests: No results for input(s): TSH, T4TOTAL, FREET4, T3FREE, THYROIDAB in the last 72 hours. Anemia Panel: No results for input(s): VITAMINB12, FOLATE, FERRITIN, TIBC, IRON, RETICCTPCT in the last 72 hours. Sepsis Labs: Recent Labs  Lab 11/20/23 9178 11/20/23 1027  LATICACIDVEN 2.3* 1.4    Recent Results (from the past 240 hours)  Blood Culture (routine x 2)     Status: None (Preliminary result)   Collection Time: 11/20/23  8:20 AM   Specimen: BLOOD  Result Value Ref Range Status   Specimen Description BLOOD LEFT ANTECUBITAL  Final   Special Requests   Final    BOTTLES DRAWN AEROBIC AND ANAEROBIC Blood Culture adequate volume   Culture   Final    NO GROWTH 4 DAYS Performed at [facility], [address] [place], [place] [address]    Report Status PENDING  Incomplete  Resp panel by RT-PCR (RSV, Flu A&B, Covid) Anterior Nasal Swab     Status: None   Collection Time: 11/20/23  8:21 AM   Specimen: Anterior Nasal Swab  Result Value Ref Range Status   SARS Coronavirus 2 by RT PCR NEGATIVE NEGATIVE Final    Comment: (NOTE) SARS-CoV-2 target nucleic acids are NOT DETECTED.  The SARS-CoV-2 RNA is generally detectable in upper respiratory specimens during the acute phase of infection. The lowest concentration of SARS-CoV-2 viral copies this assay can detect is 138 copies/mL. A negative result does not preclude SARS-Cov-2 infection and should not be used as the sole basis for treatment or other patient management decisions. A negative result may occur with  improper specimen collection/handling, submission of specimen other than nasopharyngeal swab, presence of viral mutation(s) within the areas targeted by this assay, and  inadequate number of viral copies(<138 copies/mL). A negative result must be combined with clinical observations, patient history, and epidemiological information. The expected result is Negative.  Fact Sheet for Patients:  BloggerCourse.com  Fact Sheet for Healthcare Providers:  SeriousBroker.it  This test is no t yet approved or cleared by the [place]  FDA and  has been authorized for detection and/or diagnosis of SARS-CoV-2 by FDA under an Emergency Use Authorization (EUA). This EUA will remain  in effect (meaning this test can be used) for the duration of the COVID-19 declaration under Section 564(b)(1) of the Act, 21 U.S.C.section 360bbb-3(b)(1), unless the authorization is terminated  or revoked sooner.       Influenza A by PCR NEGATIVE NEGATIVE Final   Influenza B by PCR NEGATIVE NEGATIVE Final    Comment: (NOTE) The Xpert Xpress SARS-CoV-2/FLU/RSV plus assay is intended as an aid in the diagnosis of influenza  from Nasopharyngeal swab specimens and should not be used as a sole basis for treatment. Nasal washings and aspirates are unacceptable for Xpert Xpress SARS-CoV-2/FLU/RSV testing.  Fact Sheet for Patients: BloggerCourse.com  Fact Sheet for Healthcare Providers: SeriousBroker.it  This test is not yet approved or cleared by the [place]  FDA and has been authorized for detection and/or diagnosis of SARS-CoV-2 by FDA under an Emergency Use Authorization (EUA). This EUA will remain in effect (meaning this test can be used) for the duration of the COVID-19 declaration under Section 564(b)(1) of the Act, 21 U.S.C. section 360bbb-3(b)(1), unless the authorization is terminated or revoked.     Resp Syncytial Virus by PCR NEGATIVE NEGATIVE Final    Comment: (NOTE) Fact Sheet for Patients: BloggerCourse.com  Fact Sheet for Healthcare  Providers: SeriousBroker.it  This test is not yet approved or cleared by the [place]  FDA and has been authorized for detection and/or diagnosis of SARS-CoV-2 by FDA under an Emergency Use Authorization (EUA). This EUA will remain in effect (meaning this test can be used) for the duration of the COVID-19 declaration under Section 564(b)(1) of the Act, 21 U.S.C. section 360bbb-3(b)(1), unless the authorization is terminated or revoked.  Performed at [facility], [address], [place], [place] [address]   Urine Culture     Status: Abnormal   Collection Time: 11/20/23  8:21 AM   Specimen: Urine, Random  Result Value Ref Range Status   Specimen Description   Final    URINE, RANDOM Performed at [facility], [address] [place], [place] [address]    Special Requests   Final    NONE Reflexed from [phone] Performed at [facility], [address] [place], [place] [address]    Culture >=100,000 COLONIES/mL KLEBSIELLA PNEUMONIAE (A)  Final   Report Status 11/22/2023 FINAL  Final   Organism ID, Bacteria KLEBSIELLA PNEUMONIAE (A)  Final      Susceptibility   Klebsiella pneumoniae - MIC*    AMPICILLIN >=32 RESISTANT Resistant     CEFAZOLIN  (URINE) Value in next row Sensitive      4 SENSITIVEThis is a modified FDA-approved test that has been validated and its performance characteristics determined by the reporting laboratory.  This laboratory is certified under the Clinical Laboratory Improvement Amendments CLIA as qualified to perform high complexity clinical laboratory testing.    CEFEPIME  Value in next row Sensitive      4 SENSITIVEThis is a modified FDA-approved test that has been validated and its performance characteristics determined by the reporting laboratory.  This laboratory is certified under the Clinical Laboratory Improvement Amendments CLIA as qualified to perform high complexity clinical laboratory testing.     ERTAPENEM Value in next row Sensitive      4 SENSITIVEThis is a modified FDA-approved test that has been validated and its performance characteristics determined by the reporting laboratory.  This laboratory is certified under the Clinical Laboratory Improvement Amendments CLIA as qualified to perform high complexity clinical laboratory testing.    CEFTRIAXONE  Value in next row Sensitive      4 SENSITIVEThis is a modified FDA-approved test that has been validated and its performance characteristics determined by the reporting laboratory.  This laboratory is certified under the Clinical Laboratory Improvement Amendments CLIA as qualified to perform high complexity clinical laboratory testing.    CIPROFLOXACIN Value in next row Resistant      4 SENSITIVEThis is a modified FDA-approved test that has been validated and its performance characteristics determined by  the reporting laboratory.  This laboratory is certified under the Clinical Laboratory Improvement Amendments CLIA as qualified to perform high complexity clinical laboratory testing.    GENTAMICIN Value in next row Resistant      4 SENSITIVEThis is a modified FDA-approved test that has been validated and its performance characteristics determined by the reporting laboratory.  This laboratory is certified under the Clinical Laboratory Improvement Amendments CLIA as qualified to perform high complexity clinical laboratory testing.    NITROFURANTOIN Value in next row Resistant      4 SENSITIVEThis is a modified FDA-approved test that has been validated and its performance characteristics determined by the reporting laboratory.  This laboratory is certified under the Clinical Laboratory Improvement Amendments CLIA as qualified to perform high complexity clinical laboratory testing.    TRIMETH/SULFA Value in next row Sensitive      4 SENSITIVEThis is a modified FDA-approved test that has been validated and its performance characteristics determined by  the reporting laboratory.  This laboratory is certified under the Clinical Laboratory Improvement Amendments CLIA as qualified to perform high complexity clinical laboratory testing.    AMPICILLIN/SULBACTAM Value in next row Resistant      4 SENSITIVEThis is a modified FDA-approved test that has been validated and its performance characteristics determined by the reporting laboratory.  This laboratory is certified under the Clinical Laboratory Improvement Amendments CLIA as qualified to perform high complexity clinical laboratory testing.    PIP/TAZO Value in next row Sensitive ug/mL     16 SENSITIVEThis is a modified FDA-approved test that has been validated and its performance characteristics determined by the reporting laboratory.  This laboratory is certified under the Clinical Laboratory Improvement Amendments CLIA as qualified to perform high complexity clinical laboratory testing.    MEROPENEM Value in next row Sensitive      16 SENSITIVEThis is a modified FDA-approved test that has been validated and its performance characteristics determined by the reporting laboratory.  This laboratory is certified under the Clinical Laboratory Improvement Amendments CLIA as qualified to perform high complexity clinical laboratory testing.    * >=100,000 COLONIES/mL KLEBSIELLA PNEUMONIAE  Blood Culture (routine x 2)     Status: None (Preliminary result)   Collection Time: 11/20/23  8:26 AM   Specimen: BLOOD  Result Value Ref Range Status   Specimen Description BLOOD BLOOD RIGHT WRIST  Final   Special Requests   Final    BOTTLES DRAWN AEROBIC AND ANAEROBIC Blood Culture results may not be optimal due to an inadequate volume of blood received in culture bottles   Culture   Final    NO GROWTH 4 DAYS Performed at [facility], [address], [place], [place] [address]    Report Status PENDING  Incomplete  MRSA Next Gen by PCR, Nasal     Status: None   Collection Time: 11/20/23  8:47 AM    Specimen: Nasal Mucosa; Nasal Swab  Result Value Ref Range Status   MRSA by PCR Next Gen NOT DETECTED NOT DETECTED Final    Comment: (NOTE) The GeneXpert MRSA Assay (FDA approved for NASAL specimens only), is one component of a comprehensive MRSA colonization surveillance program. It is not intended to diagnose MRSA infection nor to guide or monitor treatment for MRSA infections. Test performance is not FDA approved in patients less than 88 years old. Performed at [facility], [address] [place], [place] [address]   Respiratory (~20 pathogens) panel by PCR     Status: Abnormal  Collection Time: 11/21/23  8:47 AM   Specimen: Nasopharyngeal Swab; Respiratory  Result Value Ref Range Status   Adenovirus NOT DETECTED NOT DETECTED Final   Coronavirus 229E NOT DETECTED NOT DETECTED Final    Comment: (NOTE) The Coronavirus on the Respiratory Panel, DOES NOT test for the novel  Coronavirus (2019 nCoV)    Coronavirus HKU1 NOT DETECTED NOT DETECTED Final   Coronavirus NL63 NOT DETECTED NOT DETECTED Final   Coronavirus OC43 NOT DETECTED NOT DETECTED Final   Metapneumovirus DETECTED (A) NOT DETECTED Final   Rhinovirus / Enterovirus NOT DETECTED NOT DETECTED Final   Influenza A NOT DETECTED NOT DETECTED Final   Influenza B NOT DETECTED NOT DETECTED Final   Parainfluenza Virus 1 NOT DETECTED NOT DETECTED Final   Parainfluenza Virus 2 NOT DETECTED NOT DETECTED Final   Parainfluenza Virus 3 NOT DETECTED NOT DETECTED Final   Parainfluenza Virus 4 NOT DETECTED NOT DETECTED Final   Respiratory Syncytial Virus NOT DETECTED NOT DETECTED Final   Bordetella pertussis NOT DETECTED NOT DETECTED Final   Bordetella Parapertussis NOT DETECTED NOT DETECTED Final   Chlamydophila pneumoniae NOT DETECTED NOT DETECTED Final   Mycoplasma pneumoniae NOT DETECTED NOT DETECTED Final    Comment: Performed at [facility], [address], [place], [place] [address]         Radiology  Studies: No results found.      Scheduled Meds:  apixaban   5 mg Oral BID   atorvastatin   20 mg Oral Daily   doxycycline   100 mg Oral Q12H   furosemide   20 mg Intravenous BID   glipiZIDE   2.5 mg Oral Daily   levETIRAcetam   1,000 mg Oral BID   lisinopril   2.5 mg Oral Daily   potassium chloride   40 mEq Oral Once   QUEtiapine   25-50 mg Oral QHS   Continuous Infusions:  cefTRIAXone  (ROCEPHIN )  IV Stopped (11/23/23 2135)     LOS: 4 days       [name], MD [facility] Pager [phone]  If 7PM-7AM, please contact night-coverage [email or web address] 11/24/2023, 9:31 AM

## 2023-11-24 NOTE — Plan of Care (Signed)
  Problem: Education: Goal: Knowledge of General Education information will improve Description: Including pain rating scale, medication(s)/side effects and non-pharmacologic comfort measures Outcome: Progressing   Problem: Health Behavior/Discharge Planning: Goal: Ability to manage health-related needs will improve Outcome: Progressing   Problem: Clinical Measurements: Goal: Ability to maintain clinical measurements within normal limits will improve Outcome: Progressing   Problem: Activity: Goal: Risk for activity intolerance will decrease Outcome: Progressing   Problem: Nutrition: Goal: Adequate nutrition will be maintained Outcome: Progressing   Problem: Coping: Goal: Level of anxiety will decrease Outcome: Progressing   Problem: Elimination: Goal: Will not experience complications related to bowel motility Outcome: Progressing   Problem: Pain Managment: Goal: General experience of comfort will improve and/or be controlled Outcome: Progressing   Problem: Safety: Goal: Ability to remain free from injury will improve Outcome: Progressing   Problem: Skin Integrity: Goal: Risk for impaired skin integrity will decrease Outcome: Progressing   Problem: Activity: Goal: Ability to tolerate increased activity will improve Outcome: Progressing   Problem: Clinical Measurements: Goal: Ability to maintain a body temperature in the normal range will improve Outcome: Progressing   Problem: Respiratory: Goal: Ability to maintain adequate ventilation will improve Outcome: Progressing

## 2023-11-24 NOTE — Plan of Care (Signed)

## 2023-11-25 ENCOUNTER — Encounter: Payer: Self-pay | Admitting: Hospitalist

## 2023-11-25 DIAGNOSIS — A419 Sepsis, unspecified organism: Secondary | ICD-10-CM | POA: Diagnosis not present

## 2023-11-25 LAB — CBC
HCT: 32.8 % — ABNORMAL LOW (ref 36.0–46.0)
Hemoglobin: 10.8 g/dL — ABNORMAL LOW (ref 12.0–15.0)
MCH: 30.3 pg (ref 26.0–34.0)
MCHC: 32.9 g/dL (ref 30.0–36.0)
MCV: 92.1 fL (ref 80.0–100.0)
Platelets: 347 K/uL (ref 150–400)
RBC: 3.56 MIL/uL — ABNORMAL LOW (ref 3.87–5.11)
RDW: 13.4 % (ref 11.5–15.5)
WBC: 7.1 K/uL (ref 4.0–10.5)
nRBC: 0 % (ref 0.0–0.2)

## 2023-11-25 LAB — CULTURE, BLOOD (ROUTINE X 2)
Culture: NO GROWTH
Culture: NO GROWTH
Special Requests: ADEQUATE

## 2023-11-25 LAB — BASIC METABOLIC PANEL WITH GFR
Anion gap: 15 (ref 5–15)
BUN: 38 mg/dL — ABNORMAL HIGH (ref 8–23)
CO2: 29 mmol/L (ref 22–32)
Calcium: 9 mg/dL (ref 8.9–10.3)
Chloride: 103 mmol/L (ref 98–111)
Creatinine, Ser: 1.27 mg/dL — ABNORMAL HIGH (ref 0.44–1.00)
GFR, Estimated: 44 mL/min — ABNORMAL LOW (ref >60)
Glucose, Bld: 204 mg/dL — ABNORMAL HIGH (ref 70–99)
Potassium: 3.2 mmol/L — ABNORMAL LOW (ref 3.5–5.1)
Sodium: 147 mmol/L — ABNORMAL HIGH (ref 135–145)

## 2023-11-25 MED ORDER — DEXTROSE 5 % IV SOLN: INTRAVENOUS | Status: AC

## 2023-11-25 MED ORDER — FUROSEMIDE 10 MG/ML IJ SOLN: 20.0000 mg | Freq: Every day | INTRAMUSCULAR | Status: DC

## 2023-11-25 NOTE — Progress Notes (Signed)
 PROGRESS NOTE    Traci Carter  FMW:[id number] DOB: 03/22/1946 DOA: 11/20/2023 PCP: Patient, No Pcp Per   Assessment & Plan:   Principal Problem:   Sepsis (HCC) Active Problems:   Essential hypertension   Paroxysmal atrial fibrillation (HCC)   Hyperlipidemia associated with type 2 diabetes mellitus (HCC)   OSA (obstructive sleep apnea)   Diabetes mellitus type 2, uncomplicated (HCC)   Hyperlipemia  Assessment and Plan: Sepsis: see Dr. Lamount notes on how pt met sepsis criteria. Likely secondary to pneumonia & UTI. Sepsis resolved   Multifocal pneumonia: positive for metapneumovirus. Possible superimposed bacteria infection. Continue on IV rocephin , bronchodilators & encourage incentive spirometry. Completed doxy course.  UTI: urine cx growing klebsiella. Continue on IV rocephin   Acute encephalopathy: etiology unclear, possible secondary to above infection. Recently lost her husband of 54 years approx 2 weeks, concern for depression. Repeat CT head was neg for acute intracranial abnormalities. MRI brain ordered but wont be done (possibly) until 11/27/23 as pt has a pacemaker & knee replacement & need to see those are MRI compatible   Thrombocytopenia: resolved    CHF: unknown systolic vs diastolic vs combined. Monitor I/Os. Holding lasix    DM2: well controlled, HbA1c 5.7. Continue on SSI w/ accuchecks    Seizure disorder: continue on home dose of keppra   HTN: continue on lisinopril . Hydralazine  prn    HLD: continue on statin   Likely PAF: continue on eliquis        DVT prophylaxis: eliquis  Code Status: full  Family Communication: discussed pt's care w/ pt's daughter, [name], and answered her questions  Disposition Plan: possibly d/c to SNF  Level of care: Telemetry [facility]:    Procedures:  Antimicrobials: rocephin    Subjective: Pt is still confused  Objective: Vitals:   11/24/23 2030 11/25/23 0049 11/25/23 0457 11/25/23 0816  BP:  133/82 (!) 156/95 (!) 169/112 (!) 160/99  Pulse: (!) 105 92 99 83  Resp:  18 18 19   Temp: 97.9 F (36.6 C) 98.2 F (36.8 C) 98 F (36.7 C) (!) 97.5 F (36.4 C)  TempSrc:      SpO2: 93% 92% 93% 94%  Weight:      Height:        Intake/Output Summary (Last 24 hours) at 11/25/2023 0930 Last data filed at 11/25/2023 0457 Gross per 24 hour  Intake 100 ml  Output 2351 ml  Net -2251 ml   Filed Weights   11/20/23 0753 11/20/23 1752  Weight: 81.6 kg 99.7 kg    Examination:  General exam: appears confused Respiratory system: decreased breath sounds b/l  Cardiovascular system: S1/S2+. No rubs or clicks  Gastrointestinal system: abd is soft, NT, obese & hypoactive bowel sounds   Central nervous system: alert & awake. Moves all extremities   Psychiatry: judgement and insight appears poor. Flat mood and affect    Data Reviewed: I have personally reviewed following labs and imaging studies  CBC: Recent Labs  Lab 11/20/23 0850 11/21/23 0344 11/23/23 0349 11/24/23 0709 11/25/23 0510  WBC 5.6 4.2 4.5 5.6 7.1  NEUTROABS 3.5  --   --   --   --   HGB 9.6* 9.2* 8.8* 9.6* 10.8*  HCT 29.5* 28.1* 27.1* 28.5* 32.8*  MCV 94.2 94.6 93.8 91.3 92.1  PLT 134* 123* 166 227 347   Basic Metabolic Panel: Recent Labs  Lab 11/20/23 0850 11/21/23 0344 11/23/23 0349 11/24/23 0709 11/25/23 0510  NA 143 143 144 145 147*  K 3.4* 4.0 3.4* 3.0* 3.2*  CL 100 105 105 101 103  CO2 26 27 29 28 29   GLUCOSE 150* 84 139* 154* 204*  BUN 35* 36* 38* 38* 38*  CREATININE 1.70* 1.47* 1.42* 1.24* 1.27*  CALCIUM  8.5* 8.2* 8.3* 8.3* 9.0   GFR: Estimated Creatinine Clearance: 42.4 mL/min (A) (by C-G formula based on SCr of 1.27 mg/dL (H)). Liver Function Tests: Recent Labs  Lab 11/20/23 0850 11/21/23 0344  AST 25 21  ALT 13 12  ALKPHOS 71 65  BILITOT 0.6 0.8  PROT 6.2* 5.9*  ALBUMIN  2.8* 2.7*   No results for input(s): LIPASE, AMYLASE in the last 168 hours. No results for input(s):  AMMONIA in the last 168 hours. Coagulation Profile: Recent Labs  Lab 11/20/23 0850 11/21/23 0344  INR 1.9* 1.6*   Cardiac Enzymes: No results for input(s): CKTOTAL, CKMB, CKMBINDEX, TROPONINI in the last 168 hours. BNP (last 3 results) No results for input(s): PROBNP in the last 8760 hours. HbA1C: No results for input(s): HGBA1C in the last 72 hours. CBG: Recent Labs  Lab 11/21/23 2015  GLUCAP 125*   Lipid Profile: No results for input(s): CHOL, HDL, LDLCALC, TRIG, CHOLHDL, LDLDIRECT in the last 72 hours. Thyroid Function Tests: No results for input(s): TSH, T4TOTAL, FREET4, T3FREE, THYROIDAB in the last 72 hours. Anemia Panel: No results for input(s): VITAMINB12, FOLATE, FERRITIN, TIBC, IRON, RETICCTPCT in the last 72 hours. Sepsis Labs: Recent Labs  Lab 11/20/23 9178 11/20/23 1027  LATICACIDVEN 2.3* 1.4    Recent Results (from the past 240 hours)  Blood Culture (routine x 2)     Status: None   Collection Time: 11/20/23  8:20 AM   Specimen: BLOOD  Result Value Ref Range Status   Specimen Description BLOOD LEFT ANTECUBITAL  Final   Special Requests   Final    BOTTLES DRAWN AEROBIC AND ANAEROBIC Blood Culture adequate volume   Culture   Final    NO GROWTH 5 DAYS Performed at [facility], [address] [place], [place] [address]    Report Status 11/25/2023 FINAL  Final  Resp panel by RT-PCR (RSV, Flu A&B, Covid) Anterior Nasal Swab     Status: None   Collection Time: 11/20/23  8:21 AM   Specimen: Anterior Nasal Swab  Result Value Ref Range Status   SARS Coronavirus 2 by RT PCR NEGATIVE NEGATIVE Final    Comment: (NOTE) SARS-CoV-2 target nucleic acids are NOT DETECTED.  The SARS-CoV-2 RNA is generally detectable in upper respiratory specimens during the acute phase of infection. The lowest concentration of SARS-CoV-2 viral copies this assay can detect is 138 copies/mL. A negative result does not  preclude SARS-Cov-2 infection and should not be used as the sole basis for treatment or other patient management decisions. A negative result may occur with  improper specimen collection/handling, submission of specimen other than nasopharyngeal swab, presence of viral mutation(s) within the areas targeted by this assay, and inadequate number of viral copies(<138 copies/mL). A negative result must be combined with clinical observations, patient history, and epidemiological information. The expected result is Negative.  Fact Sheet for Patients:  BloggerCourse.com  Fact Sheet for Healthcare Providers:  SeriousBroker.it  This test is no t yet approved or cleared by the [place]  FDA and  has been authorized for detection and/or diagnosis of SARS-CoV-2 by FDA under an Emergency Use Authorization (EUA). This EUA will remain  in effect (meaning this test can be used) for the duration of the COVID-19 declaration under Section 564(b)(1) of the Act,  21 U.S.C.section 360bbb-3(b)(1), unless the authorization is terminated  or revoked sooner.       Influenza A by PCR NEGATIVE NEGATIVE Final   Influenza B by PCR NEGATIVE NEGATIVE Final    Comment: (NOTE) The Xpert Xpress SARS-CoV-2/FLU/RSV plus assay is intended as an aid in the diagnosis of influenza from Nasopharyngeal swab specimens and should not be used as a sole basis for treatment. Nasal washings and aspirates are unacceptable for Xpert Xpress SARS-CoV-2/FLU/RSV testing.  Fact Sheet for Patients: BloggerCourse.com  Fact Sheet for Healthcare Providers: SeriousBroker.it  This test is not yet approved or cleared by the [place]  FDA and has been authorized for detection and/or diagnosis of SARS-CoV-2 by FDA under an Emergency Use Authorization (EUA). This EUA will remain in effect (meaning this test can be used) for the  duration of the COVID-19 declaration under Section 564(b)(1) of the Act, 21 U.S.C. section 360bbb-3(b)(1), unless the authorization is terminated or revoked.     Resp Syncytial Virus by PCR NEGATIVE NEGATIVE Final    Comment: (NOTE) Fact Sheet for Patients: BloggerCourse.com  Fact Sheet for Healthcare Providers: SeriousBroker.it  This test is not yet approved or cleared by the [place]  FDA and has been authorized for detection and/or diagnosis of SARS-CoV-2 by FDA under an Emergency Use Authorization (EUA). This EUA will remain in effect (meaning this test can be used) for the duration of the COVID-19 declaration under Section 564(b)(1) of the Act, 21 U.S.C. section 360bbb-3(b)(1), unless the authorization is terminated or revoked.  Performed at [facility], [address] [place], [place] [address]   Urine Culture     Status: Abnormal   Collection Time: 11/20/23  8:21 AM   Specimen: Urine, Random  Result Value Ref Range Status   Specimen Description   Final    URINE, RANDOM Performed at [facility], [address] [place], [place] [address]    Special Requests   Final    NONE Reflexed from [phone] Performed at [facility], [address] [place], [place] [address]    Culture >=100,000 COLONIES/mL KLEBSIELLA PNEUMONIAE (A)  Final   Report Status 11/22/2023 FINAL  Final   Organism ID, Bacteria KLEBSIELLA PNEUMONIAE (A)  Final      Susceptibility   Klebsiella pneumoniae - MIC*    AMPICILLIN >=32 RESISTANT Resistant     CEFAZOLIN  (URINE) Value in next row Sensitive      4 SENSITIVEThis is a modified FDA-approved test that has been validated and its performance characteristics determined by the reporting laboratory.  This laboratory is certified under the Clinical Laboratory Improvement Amendments CLIA as qualified to perform high complexity clinical laboratory testing.    CEFEPIME  Value  in next row Sensitive      4 SENSITIVEThis is a modified FDA-approved test that has been validated and its performance characteristics determined by the reporting laboratory.  This laboratory is certified under the Clinical Laboratory Improvement Amendments CLIA as qualified to perform high complexity clinical laboratory testing.    ERTAPENEM Value in next row Sensitive      4 SENSITIVEThis is a modified FDA-approved test that has been validated and its performance characteristics determined by the reporting laboratory.  This laboratory is certified under the Clinical Laboratory Improvement Amendments CLIA as qualified to perform high complexity clinical laboratory testing.    CEFTRIAXONE  Value in next row Sensitive      4 SENSITIVEThis is a modified FDA-approved test that has been validated and its performance characteristics determined by  the reporting laboratory.  This laboratory is certified under the Clinical Laboratory Improvement Amendments CLIA as qualified to perform high complexity clinical laboratory testing.    CIPROFLOXACIN Value in next row Resistant      4 SENSITIVEThis is a modified FDA-approved test that has been validated and its performance characteristics determined by the reporting laboratory.  This laboratory is certified under the Clinical Laboratory Improvement Amendments CLIA as qualified to perform high complexity clinical laboratory testing.    GENTAMICIN Value in next row Resistant      4 SENSITIVEThis is a modified FDA-approved test that has been validated and its performance characteristics determined by the reporting laboratory.  This laboratory is certified under the Clinical Laboratory Improvement Amendments CLIA as qualified to perform high complexity clinical laboratory testing.    NITROFURANTOIN Value in next row Resistant      4 SENSITIVEThis is a modified FDA-approved test that has been validated and its performance characteristics determined by the reporting  laboratory.  This laboratory is certified under the Clinical Laboratory Improvement Amendments CLIA as qualified to perform high complexity clinical laboratory testing.    TRIMETH/SULFA Value in next row Sensitive      4 SENSITIVEThis is a modified FDA-approved test that has been validated and its performance characteristics determined by the reporting laboratory.  This laboratory is certified under the Clinical Laboratory Improvement Amendments CLIA as qualified to perform high complexity clinical laboratory testing.    AMPICILLIN/SULBACTAM Value in next row Resistant      4 SENSITIVEThis is a modified FDA-approved test that has been validated and its performance characteristics determined by the reporting laboratory.  This laboratory is certified under the Clinical Laboratory Improvement Amendments CLIA as qualified to perform high complexity clinical laboratory testing.    PIP/TAZO Value in next row Sensitive ug/mL     16 SENSITIVEThis is a modified FDA-approved test that has been validated and its performance characteristics determined by the reporting laboratory.  This laboratory is certified under the Clinical Laboratory Improvement Amendments CLIA as qualified to perform high complexity clinical laboratory testing.    MEROPENEM Value in next row Sensitive      16 SENSITIVEThis is a modified FDA-approved test that has been validated and its performance characteristics determined by the reporting laboratory.  This laboratory is certified under the Clinical Laboratory Improvement Amendments CLIA as qualified to perform high complexity clinical laboratory testing.    * >=100,000 COLONIES/mL KLEBSIELLA PNEUMONIAE  Blood Culture (routine x 2)     Status: None   Collection Time: 11/20/23  8:26 AM   Specimen: BLOOD  Result Value Ref Range Status   Specimen Description BLOOD BLOOD RIGHT WRIST  Final   Special Requests   Final    BOTTLES DRAWN AEROBIC AND ANAEROBIC Blood Culture results may not be  optimal due to an inadequate volume of blood received in culture bottles   Culture   Final    NO GROWTH 5 DAYS Performed at [facility], [address], [place], [place] [address]    Report Status 11/25/2023 FINAL  Final  MRSA Next Gen by PCR, Nasal     Status: None   Collection Time: 11/20/23  8:47 AM   Specimen: Nasal Mucosa; Nasal Swab  Result Value Ref Range Status   MRSA by PCR Next Gen NOT DETECTED NOT DETECTED Final    Comment: (NOTE) The GeneXpert MRSA Assay (FDA approved for NASAL specimens only), is one component of a comprehensive MRSA colonization surveillance program. It is not  intended to diagnose MRSA infection nor to guide or monitor treatment for MRSA infections. Test performance is not FDA approved in patients less than 73 years old. Performed at [facility], [address] [place], [place] [address]   Respiratory (~20 pathogens) panel by PCR     Status: Abnormal   Collection Time: 11/21/23  8:47 AM   Specimen: Nasopharyngeal Swab; Respiratory  Result Value Ref Range Status   Adenovirus NOT DETECTED NOT DETECTED Final   Coronavirus 229E NOT DETECTED NOT DETECTED Final    Comment: (NOTE) The Coronavirus on the Respiratory Panel, DOES NOT test for the novel  Coronavirus (2019 nCoV)    Coronavirus HKU1 NOT DETECTED NOT DETECTED Final   Coronavirus NL63 NOT DETECTED NOT DETECTED Final   Coronavirus OC43 NOT DETECTED NOT DETECTED Final   Metapneumovirus DETECTED (A) NOT DETECTED Final   Rhinovirus / Enterovirus NOT DETECTED NOT DETECTED Final   Influenza A NOT DETECTED NOT DETECTED Final   Influenza B NOT DETECTED NOT DETECTED Final   Parainfluenza Virus 1 NOT DETECTED NOT DETECTED Final   Parainfluenza Virus 2 NOT DETECTED NOT DETECTED Final   Parainfluenza Virus 3 NOT DETECTED NOT DETECTED Final   Parainfluenza Virus 4 NOT DETECTED NOT DETECTED Final   Respiratory Syncytial Virus NOT DETECTED NOT DETECTED Final   Bordetella pertussis NOT  DETECTED NOT DETECTED Final   Bordetella Parapertussis NOT DETECTED NOT DETECTED Final   Chlamydophila pneumoniae NOT DETECTED NOT DETECTED Final   Mycoplasma pneumoniae NOT DETECTED NOT DETECTED Final    Comment: Performed at [facility], [address] [place], [place] [address]         Radiology Studies: CT HEAD WO CONTRAST ( ) Result Date: 11/25/2023 CLINICAL DATA:  78 year old female with altered mental status. EXAM: CT HEAD WITHOUT CONTRAST TECHNIQUE: Contiguous axial images were obtained from the base of the skull through the vertex without intravenous contrast. RADIATION DOSE REDUCTION: This exam was performed according to the departmental dose-optimization program which includes automated exposure control, adjustment of the mA and/or kV according to patient size and/or use of iterative reconstruction technique. COMPARISON:  Head CT 11/20/2023. FINDINGS: Brain: Stable cerebral volume. No midline shift, ventriculomegaly, mass effect, evidence of mass lesion, intracranial hemorrhage or evidence of cortically based acute infarction. Patchy and confluent bilateral cerebral white matter hypodensity with deep white matter capsule involvement appears stable. Small chronic right cerebellar infarct, stable. Stable gray-white differentiation. Vascular: No suspicious intracranial vascular hyperdensity. Calcified atherosclerosis at the skull base. Skull: Stable and intact. Sinuses/Orbits: Ongoing ethmoid sinus mucosal thickening and opacification. Tympanic cavities and mastoids remain clear. Other: Calcified scalp vessel atherosclerosis. No acute orbit or scalp soft tissue finding. IMPRESSION: 1. No acute intracranial abnormality. Stable non contrast CT appearance of advanced chronic ischemic disease. 2. Ongoing ethmoid sinus inflammation. Electronically Signed   By: VEAR Hurst M.D.   On: 11/25/2023 06:32        Scheduled Meds:  apixaban   5 mg Oral BID   atorvastatin   20 mg Oral Daily    levETIRAcetam   1,000 mg Oral BID   lisinopril   2.5 mg Oral Daily   QUEtiapine   25-50 mg Oral QHS   Continuous Infusions:  cefTRIAXone  (ROCEPHIN )  IV Stopped (11/24/23 2230)   dextrose        LOS: 5 days       [name], MD [facility] Pager [phone]  If 7PM-7AM, please contact night-coverage [email or web address] 11/25/2023, 9:30 AM

## 2023-11-25 NOTE — Plan of Care (Signed)

## 2023-11-25 NOTE — Plan of Care (Signed)
  Problem: Education: Goal: Knowledge of General Education information will improve Description: Including pain rating scale, medication(s)/side effects and non-pharmacologic comfort measures Outcome: Progressing   Problem: Health Behavior/Discharge Planning: Goal: Ability to manage health-related needs will improve Outcome: Progressing   Problem: Clinical Measurements: Goal: Ability to maintain clinical measurements within normal limits will improve Outcome: Progressing   Problem: Coping: Goal: Level of anxiety will decrease Outcome: Progressing   Problem: Elimination: Goal: Will not experience complications related to bowel motility Outcome: Progressing   Problem: Pain Managment: Goal: General experience of comfort will improve and/or be controlled Outcome: Progressing   Problem: Safety: Goal: Ability to remain free from injury will improve Outcome: Progressing   Problem: Skin Integrity: Goal: Risk for impaired skin integrity will decrease Outcome: Progressing   Problem: Respiratory: Goal: Ability to maintain adequate ventilation will improve Outcome: Progressing   Problem: Activity: Goal: Risk for activity intolerance will decrease Outcome: Not Progressing patient not able to assist from bed to chair   Problem: Nutrition: Goal: Adequate nutrition will be maintained Outcome: Not Progressing continue to refuse meals and liquid

## 2023-11-26 DIAGNOSIS — G9340 Encephalopathy, unspecified: Secondary | ICD-10-CM | POA: Diagnosis not present

## 2023-11-26 LAB — BASIC METABOLIC PANEL WITH GFR
Anion gap: 18 — ABNORMAL HIGH (ref 5–15)
BUN: 34 mg/dL — ABNORMAL HIGH (ref 8–23)
CO2: 28 mmol/L (ref 22–32)
Calcium: 8.9 mg/dL (ref 8.9–10.3)
Chloride: 101 mmol/L (ref 98–111)
Creatinine, Ser: 1.1 mg/dL — ABNORMAL HIGH (ref 0.44–1.00)
GFR, Estimated: 52 mL/min — ABNORMAL LOW (ref >60)
Glucose, Bld: 282 mg/dL — ABNORMAL HIGH (ref 70–99)
Potassium: 3 mmol/L — ABNORMAL LOW (ref 3.5–5.1)
Sodium: 147 mmol/L — ABNORMAL HIGH (ref 135–145)

## 2023-11-26 LAB — CBC
HCT: 31.7 % — ABNORMAL LOW (ref 36.0–46.0)
Hemoglobin: 10.5 g/dL — ABNORMAL LOW (ref 12.0–15.0)
MCH: 30.6 pg (ref 26.0–34.0)
MCHC: 33.1 g/dL (ref 30.0–36.0)
MCV: 92.4 fL (ref 80.0–100.0)
Platelets: 417 K/uL — ABNORMAL HIGH (ref 150–400)
RBC: 3.43 MIL/uL — ABNORMAL LOW (ref 3.87–5.11)
RDW: 13.3 % (ref 11.5–15.5)
WBC: 9.8 K/uL (ref 4.0–10.5)
nRBC: 0 % (ref 0.0–0.2)

## 2023-11-26 LAB — MAGNESIUM: Magnesium: 1.2 mg/dL — ABNORMAL LOW (ref 1.7–2.4)

## 2023-11-26 MED ORDER — MAGNESIUM SULFATE 4 GM/100ML IV SOLN
4.0000 g | Freq: Once | INTRAVENOUS | Status: AC
  Administered 2023-11-26: 4 g via INTRAVENOUS
  Filled 2023-11-26: qty 100

## 2023-11-26 MED ORDER — POTASSIUM CHLORIDE 20 MEQ PO PACK
60.0000 meq | PACK | Freq: Two times a day (BID) | ORAL | Status: AC
  Administered 2023-11-26 (×2): 60 meq via ORAL
  Filled 2023-11-26 (×2): qty 3

## 2023-11-26 NOTE — Plan of Care (Signed)
  Problem: Education: Goal: Knowledge of General Education information will improve Description: Including pain rating scale, medication(s)/side effects and non-pharmacologic comfort measures Outcome: Progressing   Problem: Health Behavior/Discharge Planning: Goal: Ability to manage health-related needs will improve Outcome: Progressing   Problem: Clinical Measurements: Goal: Ability to maintain clinical measurements within normal limits will improve Outcome: Progressing Goal: Will remain free from infection Outcome: Progressing Goal: Diagnostic test results will improve Outcome: Progressing Goal: Respiratory complications will improve Outcome: Progressing Goal: Cardiovascular complication will be avoided Outcome: Progressing   Problem: Nutrition: Goal: Adequate nutrition will be maintained Outcome: Progressing   Problem: Coping: Goal: Level of anxiety will decrease Outcome: Progressing   Problem: Pain Managment: Goal: General experience of comfort will improve and/or be controlled Outcome: Progressing   Problem: Safety: Goal: Ability to remain free from injury will improve Outcome: Progressing   Problem: Clinical Measurements: Goal: Ability to maintain a body temperature in the normal range will improve Outcome: Progressing   Problem: Respiratory: Goal: Ability to maintain adequate ventilation will improve Outcome: Progressing Goal: Ability to maintain a clear airway will improve Outcome: Progressing

## 2023-11-26 NOTE — Plan of Care (Signed)

## 2023-11-26 NOTE — Progress Notes (Signed)
 PROGRESS NOTE    Traci Carter  FMW:[id number] DOB: January 23, 1946 DOA: 11/20/2023 PCP: Patient, No Pcp Per   Assessment & Plan:   Principal Problem:   Sepsis (HCC) Active Problems:   Essential hypertension   Paroxysmal atrial fibrillation (HCC)   Hyperlipidemia associated with type 2 diabetes mellitus (HCC)   OSA (obstructive sleep apnea)   Diabetes mellitus type 2, uncomplicated (HCC)   Hyperlipemia  Assessment and Plan: Sepsis: see Dr. Lamount notes on how pt met sepsis criteria. Likely secondary to pneumonia & UTI. Sepsis resolved   Multifocal pneumonia: positive for metapneumovirus. Possible superimposed bacteria infection. Continue on IV rocephin , bronchodilators & encourage incentive spirometry. Completed doxy course.  UTI: urine cx growing klebsiella. Continue on IV rocephin    Acute encephalopathy: etiology unclear, possible secondary to above infection. Recently lost her husband of 54 years approx 2 weeks, concern for depression. Repeat CT head was neg for acute intracranial abnormalities. MRI brain ordered but wont be done (possibly) until 11/27/23 as pt has a pacemaker & knee replacement & need to see if those are MRI compatible   Hypernatremia: free water  deficit is 2.2L. Increased D5W to 75   Hypokalemia: potassium given  Hypomagnesemia: mg sulfate ordered   Thrombocytopenia: resolved    CHF: unknown systolic vs diastolic vs combined. Monitor I/Os. Holding lasix    DM2: well controlled, HbA1c 5.7. Continue on SSI w/ accuchecks    Seizure disorder: continue on home dose of keppra   HTN: continue on lisinopril . Hydralazine  prn    HLD: continue on statin   Likely PAF: continue on eliquis        DVT prophylaxis: eliquis  Code Status: full  Family Communication: called pt's daughter, [name], no answer so I left a voicemail  Disposition Plan: possibly d/c to SNF  Level of care: Telemetry Medical Consultants:    Procedures:  Antimicrobials:  rocephin    Subjective: Pt is still confused  Objective: Vitals:   11/25/23 1615 11/25/23 1928 11/26/23 0404 11/26/23 0818  BP: (!) 174/93 (!) 173/93 124/75 (!) 151/79  Pulse: 95 90 89 84  Resp: 20 20 18 20   Temp: 98.1 F (36.7 C) 99.1 F (37.3 C) 97.8 F (36.6 C) 98.6 F (37 C)  TempSrc:  Oral Oral   SpO2: 92% 91% 91% 92%  Weight:      Height:        Intake/Output Summary (Last 24 hours) at 11/26/2023 0909 Last data filed at 11/26/2023 0408 Gross per 24 hour  Intake 619.42 ml  Output 300 ml  Net 319.42 ml   Filed Weights   11/20/23 0753 11/20/23 1752  Weight: 81.6 kg 99.7 kg    Examination:  General exam: appears confused Respiratory system: diminished breath sounds b/l  Cardiovascular system: S1 & S2+. No rubs or clicks  Gastrointestinal system: abd is soft, NT, obese & hypoactive bowel sounds   Central nervous system: alert & awake. Moves all extremities  Psychiatry: judgement and insight appears poor    Data Reviewed: I have personally reviewed following labs and imaging studies  CBC: Recent Labs  Lab 11/20/23 0850 11/21/23 0344 11/23/23 0349 11/24/23 0709 11/25/23 0510 11/26/23 0436  WBC 5.6 4.2 4.5 5.6 7.1 9.8  NEUTROABS 3.5  --   --   --   --   --   HGB 9.6* 9.2* 8.8* 9.6* 10.8* 10.5*  HCT 29.5* 28.1* 27.1* 28.5* 32.8* 31.7*  MCV 94.2 94.6 93.8 91.3 92.1 92.4  PLT 134* 123* 166 227 347 417*   Basic Metabolic  Panel: Recent Labs  Lab 11/21/23 0344 11/23/23 0349 11/24/23 0709 11/25/23 0510 11/26/23 0436  NA 143 144 145 147* 147*  K 4.0 3.4* 3.0* 3.2* 3.0*  CL 105 105 101 103 101  CO2 27 29 28 29 28   GLUCOSE 84 139* 154* 204* 282*  BUN 36* 38* 38* 38* 34*  CREATININE 1.47* 1.42* 1.24* 1.27* 1.10*  CALCIUM  8.2* 8.3* 8.3* 9.0 8.9   GFR: Estimated Creatinine Clearance: 49 mL/min (A) (by C-G formula based on SCr of 1.1 mg/dL (H)). Liver Function Tests: Recent Labs  Lab 11/20/23 0850 11/21/23 0344  AST 25 21  ALT 13 12  ALKPHOS 71  65  BILITOT 0.6 0.8  PROT 6.2* 5.9*  ALBUMIN  2.8* 2.7*   No results for input(s): LIPASE, AMYLASE in the last 168 hours. No results for input(s): AMMONIA in the last 168 hours. Coagulation Profile: Recent Labs  Lab 11/20/23 0850 11/21/23 0344  INR 1.9* 1.6*   Cardiac Enzymes: No results for input(s): CKTOTAL, CKMB, CKMBINDEX, TROPONINI in the last 168 hours. BNP (last 3 results) No results for input(s): PROBNP in the last 8760 hours. HbA1C: No results for input(s): HGBA1C in the last 72 hours. CBG: Recent Labs  Lab 11/21/23 2015  GLUCAP 125*   Lipid Profile: No results for input(s): CHOL, HDL, LDLCALC, TRIG, CHOLHDL, LDLDIRECT in the last 72 hours. Thyroid Function Tests: No results for input(s): TSH, T4TOTAL, FREET4, T3FREE, THYROIDAB in the last 72 hours. Anemia Panel: No results for input(s): VITAMINB12, FOLATE, FERRITIN, TIBC, IRON, RETICCTPCT in the last 72 hours. Sepsis Labs: Recent Labs  Lab 11/20/23 9178 11/20/23 1027  LATICACIDVEN 2.3* 1.4    Recent Results (from the past 240 hours)  Blood Culture (routine x 2)     Status: None   Collection Time: 11/20/23  8:20 AM   Specimen: BLOOD  Result Value Ref Range Status   Specimen Description BLOOD LEFT ANTECUBITAL  Final   Special Requests   Final    BOTTLES DRAWN AEROBIC AND ANAEROBIC Blood Culture adequate volume   Culture   Final    NO GROWTH 5 DAYS Performed at [facility], [address] [place], [place] [address]    Report Status 11/25/2023 FINAL  Final  Resp panel by RT-PCR (RSV, Flu A&B, Covid) Anterior Nasal Swab     Status: None   Collection Time: 11/20/23  8:21 AM   Specimen: Anterior Nasal Swab  Result Value Ref Range Status   SARS Coronavirus 2 by RT PCR NEGATIVE NEGATIVE Final    Comment: (NOTE) SARS-CoV-2 target nucleic acids are NOT DETECTED.  The SARS-CoV-2 RNA is generally detectable in upper respiratory specimens during  the acute phase of infection. The lowest concentration of SARS-CoV-2 viral copies this assay can detect is 138 copies/mL. A negative result does not preclude SARS-Cov-2 infection and should not be used as the sole basis for treatment or other patient management decisions. A negative result may occur with  improper specimen collection/handling, submission of specimen other than nasopharyngeal swab, presence of viral mutation(s) within the areas targeted by this assay, and inadequate number of viral copies(<138 copies/mL). A negative result must be combined with clinical observations, patient history, and epidemiological information. The expected result is Negative.  Fact Sheet for Patients:  BloggerCourse.com  Fact Sheet for Healthcare Providers:  SeriousBroker.it  This test is no t yet approved or cleared by the [place]  FDA and  has been authorized for detection and/or diagnosis of SARS-CoV-2 by FDA under an  Emergency Use Authorization (EUA). This EUA will remain  in effect (meaning this test can be used) for the duration of the COVID-19 declaration under Section 564(b)(1) of the Act, 21 U.S.C.section 360bbb-3(b)(1), unless the authorization is terminated  or revoked sooner.       Influenza A by PCR NEGATIVE NEGATIVE Final   Influenza B by PCR NEGATIVE NEGATIVE Final    Comment: (NOTE) The Xpert Xpress SARS-CoV-2/FLU/RSV plus assay is intended as an aid in the diagnosis of influenza from Nasopharyngeal swab specimens and should not be used as a sole basis for treatment. Nasal washings and aspirates are unacceptable for Xpert Xpress SARS-CoV-2/FLU/RSV testing.  Fact Sheet for Patients: BloggerCourse.com  Fact Sheet for Healthcare Providers: SeriousBroker.it  This test is not yet approved or cleared by the [place]  FDA and has been authorized for detection and/or  diagnosis of SARS-CoV-2 by FDA under an Emergency Use Authorization (EUA). This EUA will remain in effect (meaning this test can be used) for the duration of the COVID-19 declaration under Section 564(b)(1) of the Act, 21 U.S.C. section 360bbb-3(b)(1), unless the authorization is terminated or revoked.     Resp Syncytial Virus by PCR NEGATIVE NEGATIVE Final    Comment: (NOTE) Fact Sheet for Patients: BloggerCourse.com  Fact Sheet for Healthcare Providers: SeriousBroker.it  This test is not yet approved or cleared by the [place]  FDA and has been authorized for detection and/or diagnosis of SARS-CoV-2 by FDA under an Emergency Use Authorization (EUA). This EUA will remain in effect (meaning this test can be used) for the duration of the COVID-19 declaration under Section 564(b)(1) of the Act, 21 U.S.C. section 360bbb-3(b)(1), unless the authorization is terminated or revoked.  Performed at [facility], [address], [place], [place] [address]   Urine Culture     Status: Abnormal   Collection Time: 11/20/23  8:21 AM   Specimen: Urine, Random  Result Value Ref Range Status   Specimen Description   Final    URINE, RANDOM Performed at [facility], [address] [place], [place] [address]    Special Requests   Final    NONE Reflexed from [id number] Performed at [facility], [address] [place], [place] [address]    Culture >=100,000 COLONIES/mL KLEBSIELLA PNEUMONIAE (A)  Final   Report Status 11/22/2023 FINAL  Final   Organism ID, Bacteria KLEBSIELLA PNEUMONIAE (A)  Final      Susceptibility   Klebsiella pneumoniae - MIC*    AMPICILLIN >=32 RESISTANT Resistant     CEFAZOLIN  (URINE) Value in next row Sensitive      4 SENSITIVEThis is a modified FDA-approved test that has been validated and its performance characteristics determined by the reporting laboratory.  This laboratory is  certified under the Clinical Laboratory Improvement Amendments CLIA as qualified to perform high complexity clinical laboratory testing.    CEFEPIME  Value in next row Sensitive      4 SENSITIVEThis is a modified FDA-approved test that has been validated and its performance characteristics determined by the reporting laboratory.  This laboratory is certified under the Clinical Laboratory Improvement Amendments CLIA as qualified to perform high complexity clinical laboratory testing.    ERTAPENEM Value in next row Sensitive      4 SENSITIVEThis is a modified FDA-approved test that has been validated and its performance characteristics determined by the reporting laboratory.  This laboratory is certified under the Clinical Laboratory Improvement Amendments CLIA as qualified to perform high complexity clinical laboratory testing.  CEFTRIAXONE  Value in next row Sensitive      4 SENSITIVEThis is a modified FDA-approved test that has been validated and its performance characteristics determined by the reporting laboratory.  This laboratory is certified under the Clinical Laboratory Improvement Amendments CLIA as qualified to perform high complexity clinical laboratory testing.    CIPROFLOXACIN Value in next row Resistant      4 SENSITIVEThis is a modified FDA-approved test that has been validated and its performance characteristics determined by the reporting laboratory.  This laboratory is certified under the Clinical Laboratory Improvement Amendments CLIA as qualified to perform high complexity clinical laboratory testing.    GENTAMICIN Value in next row Resistant      4 SENSITIVEThis is a modified FDA-approved test that has been validated and its performance characteristics determined by the reporting laboratory.  This laboratory is certified under the Clinical Laboratory Improvement Amendments CLIA as qualified to perform high complexity clinical laboratory testing.    NITROFURANTOIN Value in next row  Resistant      4 SENSITIVEThis is a modified FDA-approved test that has been validated and its performance characteristics determined by the reporting laboratory.  This laboratory is certified under the Clinical Laboratory Improvement Amendments CLIA as qualified to perform high complexity clinical laboratory testing.    TRIMETH/SULFA Value in next row Sensitive      4 SENSITIVEThis is a modified FDA-approved test that has been validated and its performance characteristics determined by the reporting laboratory.  This laboratory is certified under the Clinical Laboratory Improvement Amendments CLIA as qualified to perform high complexity clinical laboratory testing.    AMPICILLIN/SULBACTAM Value in next row Resistant      4 SENSITIVEThis is a modified FDA-approved test that has been validated and its performance characteristics determined by the reporting laboratory.  This laboratory is certified under the Clinical Laboratory Improvement Amendments CLIA as qualified to perform high complexity clinical laboratory testing.    PIP/TAZO Value in next row Sensitive ug/mL     16 SENSITIVEThis is a modified FDA-approved test that has been validated and its performance characteristics determined by the reporting laboratory.  This laboratory is certified under the Clinical Laboratory Improvement Amendments CLIA as qualified to perform high complexity clinical laboratory testing.    MEROPENEM Value in next row Sensitive      16 SENSITIVEThis is a modified FDA-approved test that has been validated and its performance characteristics determined by the reporting laboratory.  This laboratory is certified under the Clinical Laboratory Improvement Amendments CLIA as qualified to perform high complexity clinical laboratory testing.    * >=100,000 COLONIES/mL KLEBSIELLA PNEUMONIAE  Blood Culture (routine x 2)     Status: None   Collection Time: 11/20/23  8:26 AM   Specimen: BLOOD  Result Value Ref Range Status    Specimen Description BLOOD BLOOD RIGHT WRIST  Final   Special Requests   Final    BOTTLES DRAWN AEROBIC AND ANAEROBIC Blood Culture results may not be optimal due to an inadequate volume of blood received in culture bottles   Culture   Final    NO GROWTH 5 DAYS Performed at [facility], [address], [place], [place] [address]    Report Status 11/25/2023 FINAL  Final  MRSA Next Gen by PCR, Nasal     Status: None   Collection Time: 11/20/23  8:47 AM   Specimen: Nasal Mucosa; Nasal Swab  Result Value Ref Range Status   MRSA by PCR Next Gen NOT DETECTED NOT DETECTED Final  Comment: (NOTE) The GeneXpert MRSA Assay (FDA approved for NASAL specimens only), is one component of a comprehensive MRSA colonization surveillance program. It is not intended to diagnose MRSA infection nor to guide or monitor treatment for MRSA infections. Test performance is not FDA approved in patients less than 43 years old. Performed at [facility], [address] [place], [place] [address]   Respiratory (~20 pathogens) panel by PCR     Status: Abnormal   Collection Time: 11/21/23  8:47 AM   Specimen: Nasopharyngeal Swab; Respiratory  Result Value Ref Range Status   Adenovirus NOT DETECTED NOT DETECTED Final   Coronavirus 229E NOT DETECTED NOT DETECTED Final    Comment: (NOTE) The Coronavirus on the Respiratory Panel, DOES NOT test for the novel  Coronavirus (2019 nCoV)    Coronavirus HKU1 NOT DETECTED NOT DETECTED Final   Coronavirus NL63 NOT DETECTED NOT DETECTED Final   Coronavirus OC43 NOT DETECTED NOT DETECTED Final   Metapneumovirus DETECTED (A) NOT DETECTED Final   Rhinovirus / Enterovirus NOT DETECTED NOT DETECTED Final   Influenza A NOT DETECTED NOT DETECTED Final   Influenza B NOT DETECTED NOT DETECTED Final   Parainfluenza Virus 1 NOT DETECTED NOT DETECTED Final   Parainfluenza Virus 2 NOT DETECTED NOT DETECTED Final   Parainfluenza Virus 3 NOT DETECTED NOT DETECTED  Final   Parainfluenza Virus 4 NOT DETECTED NOT DETECTED Final   Respiratory Syncytial Virus NOT DETECTED NOT DETECTED Final   Bordetella pertussis NOT DETECTED NOT DETECTED Final   Bordetella Parapertussis NOT DETECTED NOT DETECTED Final   Chlamydophila pneumoniae NOT DETECTED NOT DETECTED Final   Mycoplasma pneumoniae NOT DETECTED NOT DETECTED Final    Comment: Performed at [facility], [address], [place], [place] [address]         Radiology Studies: CT HEAD WO CONTRAST ( ) Result Date: 11/25/2023 CLINICAL DATA:  78 year old female with altered mental status. EXAM: CT HEAD WITHOUT CONTRAST TECHNIQUE: Contiguous axial images were obtained from the base of the skull through the vertex without intravenous contrast. RADIATION DOSE REDUCTION: This exam was performed according to the departmental dose-optimization program which includes automated exposure control, adjustment of the mA and/or kV according to patient size and/or use of iterative reconstruction technique. COMPARISON:  Head CT 11/20/2023. FINDINGS: Brain: Stable cerebral volume. No midline shift, ventriculomegaly, mass effect, evidence of mass lesion, intracranial hemorrhage or evidence of cortically based acute infarction. Patchy and confluent bilateral cerebral white matter hypodensity with deep white matter capsule involvement appears stable. Small chronic right cerebellar infarct, stable. Stable gray-white differentiation. Vascular: No suspicious intracranial vascular hyperdensity. Calcified atherosclerosis at the skull base. Skull: Stable and intact. Sinuses/Orbits: Ongoing ethmoid sinus mucosal thickening and opacification. Tympanic cavities and mastoids remain clear. Other: Calcified scalp vessel atherosclerosis. No acute orbit or scalp soft tissue finding. IMPRESSION: 1. No acute intracranial abnormality. Stable non contrast CT appearance of advanced chronic ischemic disease. 2. Ongoing ethmoid sinus inflammation.  Electronically Signed   By: VEAR Hurst M.D.   On: 11/25/2023 06:32        Scheduled Meds:  apixaban   5 mg Oral BID   atorvastatin   20 mg Oral Daily   levETIRAcetam   1,000 mg Oral BID   lisinopril   2.5 mg Oral Daily   potassium chloride   60 mEq Oral BID   QUEtiapine   25-50 mg Oral QHS   Continuous Infusions:  cefTRIAXone  (ROCEPHIN )  IV 1 g (11/25/23 2002)   dextrose  60 mL/hr at 11/26/23 9485  LOS: 6 days       [name], MD [facility] Pager [phone]  If 7PM-7AM, please contact night-coverage [email or web address] 11/26/2023, 9:09 AM

## 2023-11-27 DIAGNOSIS — G9340 Encephalopathy, unspecified: Secondary | ICD-10-CM | POA: Diagnosis not present

## 2023-11-27 LAB — CBC
HCT: 33.7 % — ABNORMAL LOW (ref 36.0–46.0)
Hemoglobin: 11.2 g/dL — ABNORMAL LOW (ref 12.0–15.0)
MCH: 30.4 pg (ref 26.0–34.0)
MCHC: 33.2 g/dL (ref 30.0–36.0)
MCV: 91.6 fL (ref 80.0–100.0)
Platelets: 523 K/uL — ABNORMAL HIGH (ref 150–400)
RBC: 3.68 MIL/uL — ABNORMAL LOW (ref 3.87–5.11)
RDW: 13.4 % (ref 11.5–15.5)
WBC: 12.8 K/uL — ABNORMAL HIGH (ref 4.0–10.5)
nRBC: 0.2 % (ref 0.0–0.2)

## 2023-11-27 LAB — BASIC METABOLIC PANEL WITH GFR
Anion gap: 13 (ref 5–15)
BUN: 29 mg/dL — ABNORMAL HIGH (ref 8–23)
CO2: 29 mmol/L (ref 22–32)
Calcium: 9.3 mg/dL (ref 8.9–10.3)
Chloride: 102 mmol/L (ref 98–111)
Creatinine, Ser: 1.03 mg/dL — ABNORMAL HIGH (ref 0.44–1.00)
GFR, Estimated: 56 mL/min — ABNORMAL LOW (ref >60)
Glucose, Bld: 281 mg/dL — ABNORMAL HIGH (ref 70–99)
Potassium: 3.4 mmol/L — ABNORMAL LOW (ref 3.5–5.1)
Sodium: 144 mmol/L (ref 135–145)

## 2023-11-27 LAB — AMMONIA: Ammonia: 15 umol/L (ref 9–35)

## 2023-11-27 MED ORDER — LISINOPRIL 5 MG PO TABS
5.0000 mg | ORAL_TABLET | Freq: Every day | ORAL | Status: DC
Start: 2023-11-28 — End: 2023-11-29
  Administered 2023-11-28 – 2023-11-29 (×2): 5 mg via ORAL
  Filled 2023-11-27 (×2): qty 1

## 2023-11-27 MED ORDER — LEVETIRACETAM (KEPPRA) 500 MG/5 ML ADULT IV PUSH
1000.0000 mg | Freq: Two times a day (BID) | INTRAVENOUS | Status: DC
  Administered 2023-11-27 – 2023-12-01 (×8): 1000 mg via INTRAVENOUS
  Filled 2023-11-27 (×10): qty 10

## 2023-11-27 MED ORDER — SODIUM CHLORIDE 0.9 % IV SOLN
1.0000 g | INTRAVENOUS | Status: AC
  Administered 2023-11-27 – 2023-11-28 (×2): 1 g via INTRAVENOUS
  Filled 2023-11-27 (×2): qty 10

## 2023-11-27 NOTE — Plan of Care (Signed)

## 2023-11-27 NOTE — Progress Notes (Signed)
 PROGRESS NOTE    Traci Carter  FMW:[id number] DOB: Aug 04, 1945 DOA: 11/20/2023 PCP: Patient, No Pcp Per   Assessment & Plan:   Principal Problem:   Sepsis (HCC) Active Problems:   Essential hypertension   Paroxysmal atrial fibrillation (HCC)   Hyperlipidemia associated with type 2 diabetes mellitus (HCC)   OSA (obstructive sleep apnea)   Diabetes mellitus type 2, uncomplicated (HCC)   Hyperlipemia  Assessment and Plan: Sepsis: see Dr. Lamount notes on how pt met sepsis criteria. Likely secondary to pneumonia & UTI. Sepsis resolved   Multifocal pneumonia: positive for metapneumovirus. Possible superimposed bacteria infection. Continue on IV rocephin , bronchodilators & encourage incentive spirometry. Completed doxy course.  UTI: urine cx growing klebsiella. Continue on IV rocephin    Acute encephalopathy: etiology unclear, possible secondary to above infection. Recently lost her husband of 54 years approx 2 weeks, concern for depression. Improving today, answering more questions appropriately today. Repeat CT head was neg for acute intracranial abnormalities. Pacemaker is NOT MRI compatible so MRI was d/c.   Hypernatremia: resolved   Hypokalemia: KCl ordered   Hypomagnesemia: mg replaced  Thrombocytopenia: resolved    CHF: unknown systolic vs diastolic vs combined. Monitor I/Os. Holding lasix    DM2: well controlled, HbA1c 5.7. Continue on SSI w/ accuchecks    Seizure disorder: changing po keppra  to IV keppra  has pt does not want to take po meds currently   HTN: increased home dose of lisinopril . Hydralazine  prn    HLD: continue on statin   Likely PAF: continue on eliquis        DVT prophylaxis: eliquis  Code Status: full  Family Communication: called pt's daughter, Traci Carter, again and no answer so I left another voicemail   Disposition Plan: possibly d/c to SNF  Level of care: Telemetry Medical Consult[facility]:    Procedures:  Antimicrobials:  rocephin    Subjective: Pt c/o feeling sad  Objective: Vitals:   11/26/23 1550 11/26/23 2021 11/27/23 0453 11/27/23 0835  BP: (!) 159/98 (!) 170/105 (!) 150/97 (!) 161/86  Pulse: 90 89 (!) 105 94  Resp: 18   20  Temp: 98.2 F (36.8 C) 98.2 F (36.8 C) 97.9 F (36.6 C) 98 F (36.7 C)  TempSrc:    Oral  SpO2: 92% (!) 89% 91% 93%  Weight:      Height:        Intake/Output Summary (Last 24 hours) at 11/27/2023 0916 Last data filed at 11/27/2023 0500 Gross per 24 hour  Intake 320 ml  Output 300 ml  Net 20 ml   Filed Weights   11/20/23 0753 11/20/23 1752  Weight: 81.6 kg 99.7 kg    Examination:  General exam: appears depressed  Respiratory system: decreased breath sounds b/l Cardiovascular system: S1/S2+. No rubs or clicks  Gastrointestinal system: abd is soft, NT, obese & hypoactive bowel sounds  Central nervous system: alert & awake. Moves all extremities  Psychiatry: judgement and insight appears improved. Depressed mood and affect    Data Reviewed: I have personally reviewed following labs and imaging studies  CBC: Recent Labs  Lab 11/23/23 0349 11/24/23 0709 11/25/23 0510 11/26/23 0436 11/27/23 0546  WBC 4.5 5.6 7.1 9.8 12.8*  HGB 8.8* 9.6* 10.8* 10.5* 11.2*  HCT 27.1* 28.5* 32.8* 31.7* 33.7*  MCV 93.8 91.3 92.1 92.4 91.6  PLT 166 227 347 417* 523*   Basic Metabolic Panel: Recent Labs  Lab 11/23/23 0349 11/24/23 0709 11/25/23 0510 11/26/23 0436 11/27/23 0546  NA 144 145 147* 147* 144  K 3.4*  3.0* 3.2* 3.0* 3.4*  CL 105 101 103 101 102  CO2 29 28 29 28 29   GLUCOSE 139* 154* 204* 282* 281*  BUN 38* 38* 38* 34* 29*  CREATININE 1.42* 1.24* 1.27* 1.10* 1.03*  CALCIUM  8.3* 8.3* 9.0 8.9 9.3  MG  --   --   --  1.2*  --    GFR: Estimated Creatinine Clearance: 52.3 mL/min (A) (by C-G formula based on SCr of 1.03 mg/dL (H)). Liver Function Tests: Recent Labs  Lab 11/21/23 0344  AST 21  ALT 12  ALKPHOS 65  BILITOT 0.8  PROT 5.9*  ALBUMIN  2.7*    No results for input(s): LIPASE, AMYLASE in the last 168 hours. Recent Labs  Lab 11/27/23 0546  AMMONIA 15   Coagulation Profile: Recent Labs  Lab 11/21/23 0344  INR 1.6*   Cardiac Enzymes: No results for input(s): CKTOTAL, CKMB, CKMBINDEX, TROPONINI in the last 168 hours. BNP (last 3 results) No results for input(s): PROBNP in the last 8760 hours. HbA1C: No results for input(s): HGBA1C in the last 72 hours. CBG: Recent Labs  Lab 11/21/23 2015  GLUCAP 125*   Lipid Profile: No results for input(s): CHOL, HDL, LDLCALC, TRIG, CHOLHDL, LDLDIRECT in the last 72 hours. Thyroid Function Tests: No results for input(s): TSH, T4TOTAL, FREET4, T3FREE, THYROIDAB in the last 72 hours. Anemia Panel: No results for input(s): VITAMINB12, FOLATE, FERRITIN, TIBC, IRON, RETICCTPCT in the last 72 hours. Sepsis Labs: Recent Labs  Lab 11/20/23 1027  LATICACIDVEN 1.4    Recent Results (from the past 240 hours)  Blood Culture (routine x 2)     Status: None   Collection Time: 11/20/23  8:20 AM   Specimen: BLOOD  Result Value Ref Range Status   Specimen Description BLOOD LEFT ANTECUBITAL  Final   Special Requests   Final    BOTTLES DRAWN AEROBIC AND ANAEROBIC Blood Culture adequate volume   Culture   Final    NO GROWTH 5 DAYS Performed at [facility], [address] [place], [place] [address]    Report Status 11/25/2023 FINAL  Final  Resp panel by RT-PCR (RSV, Flu A&B, Covid) Anterior Nasal Swab     Status: None   Collection Time: 11/20/23  8:21 AM   Specimen: Anterior Nasal Swab  Result Value Ref Range Status   SARS Coronavirus 2 by RT PCR NEGATIVE NEGATIVE Final    Comment: (NOTE) SARS-CoV-2 target nucleic acids are NOT DETECTED.  The SARS-CoV-2 RNA is generally detectable in upper respiratory specimens during the acute phase of infection. The lowest concentration of SARS-CoV-2 viral copies this assay can detect  is 138 copies/mL. A negative result does not preclude SARS-Cov-2 infection and should not be used as the sole basis for treatment or other patient management decisions. A negative result may occur with  improper specimen collection/handling, submission of specimen other than nasopharyngeal swab, presence of viral mutation(s) within the areas targeted by this assay, and inadequate number of viral copies(<138 copies/mL). A negative result must be combined with clinical observations, patient history, and epidemiological information. The expected result is Negative.  Fact Sheet for Patients:  BloggerCourse.com  Fact Sheet for Healthcare Providers:  SeriousBroker.it  This test is no t yet approved or cleared by the [place]  FDA and  has been authorized for detection and/or diagnosis of SARS-CoV-2 by FDA under an Emergency Use Authorization (EUA). This EUA will remain  in effect (meaning this test can be used) for the duration of the COVID-19  declaration under Section 564(b)(1) of the Act, 21 U.S.C.section 360bbb-3(b)(1), unless the authorization is terminated  or revoked sooner.       Influenza A by PCR NEGATIVE NEGATIVE Final   Influenza B by PCR NEGATIVE NEGATIVE Final    Comment: (NOTE) The Xpert Xpress SARS-CoV-2/FLU/RSV plus assay is intended as an aid in the diagnosis of influenza from Nasopharyngeal swab specimens and should not be used as a sole basis for treatment. Nasal washings and aspirates are unacceptable for Xpert Xpress SARS-CoV-2/FLU/RSV testing.  Fact Sheet for Patients: BloggerCourse.com  Fact Sheet for Healthcare Providers: SeriousBroker.it  This test is not yet approved or cleared by the [place]  FDA and has been authorized for detection and/or diagnosis of SARS-CoV-2 by FDA under an Emergency Use Authorization (EUA). This EUA will remain in effect  (meaning this test can be used) for the duration of the COVID-19 declaration under Section 564(b)(1) of the Act, 21 U.S.C. section 360bbb-3(b)(1), unless the authorization is terminated or revoked.     Resp Syncytial Virus by PCR NEGATIVE NEGATIVE Final    Comment: (NOTE) Fact Sheet for Patients: BloggerCourse.com  Fact Sheet for Healthcare Providers: SeriousBroker.it  This test is not yet approved or cleared by the [place]  FDA and has been authorized for detection and/or diagnosis of SARS-CoV-2 by FDA under an Emergency Use Authorization (EUA). This EUA will remain in effect (meaning this test can be used) for the duration of the COVID-19 declaration under Section 564(b)(1) of the Act, 21 U.S.C. section 360bbb-3(b)(1), unless the authorization is terminated or revoked.  Performed at [facility], [address] [place], [place] [address]   Urine Culture     Status: Abnormal   Collection Time: 11/20/23  8:21 AM   Specimen: Urine, Random  Result Value Ref Range Status   Specimen Description   Final    URINE, RANDOM Performed at [facility], [address] [place], [place] [address]    Special Requests   Final    NONE Reflexed from [id number] Performed at [facility], [address] [place], [place] [address]    Culture >=100,000 COLONIES/mL KLEBSIELLA PNEUMONIAE (A)  Final   Report Status 11/22/2023 FINAL  Final   Organism ID, Bacteria KLEBSIELLA PNEUMONIAE (A)  Final      Susceptibility   Klebsiella pneumoniae - MIC*    AMPICILLIN >=32 RESISTANT Resistant     CEFAZOLIN  (URINE) Value in next row Sensitive      4 SENSITIVEThis is a modified FDA-approved test that has been validated and its performance characteristics determined by the reporting laboratory.  This laboratory is certified under the Clinical Laboratory Improvement Amendments CLIA as qualified to perform high complexity clinical  laboratory testing.    CEFEPIME  Value in next row Sensitive      4 SENSITIVEThis is a modified FDA-approved test that has been validated and its performance characteristics determined by the reporting laboratory.  This laboratory is certified under the Clinical Laboratory Improvement Amendments CLIA as qualified to perform high complexity clinical laboratory testing.    ERTAPENEM Value in next row Sensitive      4 SENSITIVEThis is a modified FDA-approved test that has been validated and its performance characteristics determined by the reporting laboratory.  This laboratory is certified under the Clinical Laboratory Improvement Amendments CLIA as qualified to perform high complexity clinical laboratory testing.    CEFTRIAXONE  Value in next row Sensitive      4 SENSITIVEThis is a modified FDA-approved test that has been  validated and its performance characteristics determined by the reporting laboratory.  This laboratory is certified under the Clinical Laboratory Improvement Amendments CLIA as qualified to perform high complexity clinical laboratory testing.    CIPROFLOXACIN Value in next row Resistant      4 SENSITIVEThis is a modified FDA-approved test that has been validated and its performance characteristics determined by the reporting laboratory.  This laboratory is certified under the Clinical Laboratory Improvement Amendments CLIA as qualified to perform high complexity clinical laboratory testing.    GENTAMICIN Value in next row Resistant      4 SENSITIVEThis is a modified FDA-approved test that has been validated and its performance characteristics determined by the reporting laboratory.  This laboratory is certified under the Clinical Laboratory Improvement Amendments CLIA as qualified to perform high complexity clinical laboratory testing.    NITROFURANTOIN Value in next row Resistant      4 SENSITIVEThis is a modified FDA-approved test that has been validated and its performance  characteristics determined by the reporting laboratory.  This laboratory is certified under the Clinical Laboratory Improvement Amendments CLIA as qualified to perform high complexity clinical laboratory testing.    TRIMETH/SULFA Value in next row Sensitive      4 SENSITIVEThis is a modified FDA-approved test that has been validated and its performance characteristics determined by the reporting laboratory.  This laboratory is certified under the Clinical Laboratory Improvement Amendments CLIA as qualified to perform high complexity clinical laboratory testing.    AMPICILLIN/SULBACTAM Value in next row Resistant      4 SENSITIVEThis is a modified FDA-approved test that has been validated and its performance characteristics determined by the reporting laboratory.  This laboratory is certified under the Clinical Laboratory Improvement Amendments CLIA as qualified to perform high complexity clinical laboratory testing.    PIP/TAZO Value in next row Sensitive ug/mL     16 SENSITIVEThis is a modified FDA-approved test that has been validated and its performance characteristics determined by the reporting laboratory.  This laboratory is certified under the Clinical Laboratory Improvement Amendments CLIA as qualified to perform high complexity clinical laboratory testing.    MEROPENEM Value in next row Sensitive      16 SENSITIVEThis is a modified FDA-approved test that has been validated and its performance characteristics determined by the reporting laboratory.  This laboratory is certified under the Clinical Laboratory Improvement Amendments CLIA as qualified to perform high complexity clinical laboratory testing.    * >=100,000 COLONIES/mL KLEBSIELLA PNEUMONIAE  Blood Culture (routine x 2)     Status: None   Collection Time: 11/20/23  8:26 AM   Specimen: BLOOD  Result Value Ref Range Status   Specimen Description BLOOD BLOOD RIGHT WRIST  Final   Special Requests   Final    BOTTLES DRAWN AEROBIC AND  ANAEROBIC Blood Culture results may not be optimal due to an inadequate volume of blood received in culture bottles   Culture   Final    NO GROWTH 5 DAYS Performed at [facility], [address] [place], [place] [address]    Report Status 11/25/2023 FINAL  Final  MRSA Next Gen by PCR, Nasal     Status: None   Collection Time: 11/20/23  8:47 AM   Specimen: Nasal Mucosa; Nasal Swab  Result Value Ref Range Status   MRSA by PCR Next Gen NOT DETECTED NOT DETECTED Final    Comment: (NOTE) The GeneXpert MRSA Assay (FDA approved for NASAL specimens only), is one component of a comprehensive  MRSA colonization surveillance program. It is not intended to diagnose MRSA infection nor to guide or monitor treatment for MRSA infections. Test performance is not FDA approved in patients less than 77 years old. Performed at [facility], [address] [place], [place] [address]   Respiratory (~20 pathogens) panel by PCR     Status: Abnormal   Collection Time: 11/21/23  8:47 AM   Specimen: Nasopharyngeal Swab; Respiratory  Result Value Ref Range Status   Adenovirus NOT DETECTED NOT DETECTED Final   Coronavirus 229E NOT DETECTED NOT DETECTED Final    Comment: (NOTE) The Coronavirus on the Respiratory Panel, DOES NOT test for the novel  Coronavirus (2019 nCoV)    Coronavirus HKU1 NOT DETECTED NOT DETECTED Final   Coronavirus NL63 NOT DETECTED NOT DETECTED Final   Coronavirus OC43 NOT DETECTED NOT DETECTED Final   Metapneumovirus DETECTED (A) NOT DETECTED Final   Rhinovirus / Enterovirus NOT DETECTED NOT DETECTED Final   Influenza A NOT DETECTED NOT DETECTED Final   Influenza B NOT DETECTED NOT DETECTED Final   Parainfluenza Virus 1 NOT DETECTED NOT DETECTED Final   Parainfluenza Virus 2 NOT DETECTED NOT DETECTED Final   Parainfluenza Virus 3 NOT DETECTED NOT DETECTED Final   Parainfluenza Virus 4 NOT DETECTED NOT DETECTED Final   Respiratory Syncytial Virus NOT DETECTED NOT  DETECTED Final   Bordetella pertussis NOT DETECTED NOT DETECTED Final   Bordetella Parapertussis NOT DETECTED NOT DETECTED Final   Chlamydophila pneumoniae NOT DETECTED NOT DETECTED Final   Mycoplasma pneumoniae NOT DETECTED NOT DETECTED Final    Comment: Performed at [facility], [address] [place], [place] [address]         Radiology Studies: No results found.       Scheduled Meds:  apixaban   5 mg Oral BID   atorvastatin   20 mg Oral Daily   levETIRAcetam   1,000 mg Oral BID   lisinopril   2.5 mg Oral Daily   QUEtiapine   25-50 mg Oral QHS   Continuous Infusions:  cefTRIAXone  (ROCEPHIN )  IV Stopped (11/26/23 2137)     LOS: 7 days       [name], MD [facility] Pager [phone]  If 7PM-7AM, please contact night-coverage [email or web address] 11/27/2023, 9:16 AM

## 2023-11-27 NOTE — Progress Notes (Addendum)
 Physical Therapy Treatment Patient Details Name: Traci Carter MRN: [id number] DOB: [birth year] Today's Date: 11/27/2023   History of Present Illness Patient is a 78 year old female with sepsis likely due to pneumonia/UTI. PMH: atrial fibrillation, pacemaker for complete heart block, DM, CAD, HTN, ICH, CVA, seizures, fibromyalgia, anxiety/depression, OSA    PT Comments  Patient needs encouragement to participate with therapy efforts. She required Max A for rolling in bed with cues for technique. She participated with LE exercise for strengthening. Patient declined sitting up today or further mobility. Slow progress overall. Rehabilitation < 3 hours/day recommended after this hospital stay. PT will continue to follow.    If plan is discharge home, recommend the following: Two people to help with walking and/or transfers;A lot of help with bathing/dressing/bathroom;Assistance with cooking/housework;Assist for transportation;Help with stairs or ramp for entrance;Direct supervision/assist for medications management   Can travel by private vehicle     No  Equipment Recommendations  Hoyer lift;Wheelchair (measurements PT);BSC/3in1;Hospital bed (if going directly home)    Recommendations for Other Services       Precautions / Restrictions Precautions Precautions: Fall Recall of Precautions/Restrictions: Impaired Restrictions Weight Bearing Restrictions Per Provider Order: No     Mobility  Bed Mobility Overal bed mobility: Needs Assistance Bed Mobility: Rolling Rolling: Max assist, Used rails (with cues for technique)         General bed mobility comments: Max A for rolling to left and right to change bed pad that was wet with urine. patient declined further mobility despite encouragement, no reason specified    Transfers                        Ambulation/Gait                   Stairs             Wheelchair Mobility     Tilt Bed    Modified  Rankin (Stroke Patients Only)       Balance                                            Communication Communication Communication: Impaired Factors Affecting Communication: Difficulty expressing self  Cognition Arousal: Alert Behavior During Therapy: Flat affect   PT - Cognitive impairments: No family/caregiver present to determine baseline, Safety/Judgement, Sequencing                       PT - Cognition Comments: needs encouragement to participate. confusion about situation. increased time required for command following Following commands: Impaired Following commands impaired: Follows one step commands with increased time    Cueing Cueing Techniques: Verbal cues, Tactile cues  Exercises General Exercises - Lower Extremity Ankle Circles/Pumps: AAROM, PROM, Strengthening, Both, 10 reps, Supine Heel Slides: PROM, AAROM, Strengthening, Both, 10 reps, Supine Hip ABduction/ADduction: PROM, AAROM, Strengthening, Both, 10 reps, Supine Other Exercises Other Exercises: cues for exercise technique for strengthening and participation. patient refused to participate with UE exercises    General Comments        Pertinent Vitals/Pain Pain Assessment Pain Assessment: No/denies pain    Home Living                          Prior Function  PT Goals (current goals can now be found in the care plan section) Acute Rehab PT Goals Patient Stated Goal: to feel better PT Goal Formulation: With patient Time For Goal Achievement: 12/05/23 Potential to Achieve Goals: Fair Progress towards PT goals: Progressing toward goals    Frequency    Min 1X/week      PT Plan      Co-evaluation              AM-PAC PT 6 Clicks Mobility   Outcome Measure  Help needed turning from your back to your side while in a flat bed without using bedrails?: A Lot Help needed moving from lying on your back to sitting on the side of a flat bed  without using bedrails?: A Lot Help needed moving to and from a bed to a chair (including a wheelchair)?: Total Help needed standing up from a chair using your arms (e.g., wheelchair or bedside chair)?: Total Help needed to walk in hospital room?: Total Help needed climbing 3-5 steps with a railing? : Total 6 Click Score: 8    End of Session   Activity Tolerance: Other (comment) (limited by willingness to participate) Patient left: in bed;with call bell/phone within reach;with bed alarm set Nurse Communication: Mobility status PT Visit Diagnosis: Muscle weakness (generalized) (M62.81);Unsteadiness on feet (R26.81)     Time: 9153-9094 PT Time Calculation (min) (ACUTE ONLY): 19 min  Charges:    $Therapeutic Activity: 8-22 mins PT General Charges $$ ACUTE PT VISIT: 1 Visit                     Traci Carter, PT, MPT    Traci Carter 11/27/2023, 9:40 AM

## 2023-11-28 DIAGNOSIS — G9340 Encephalopathy, unspecified: Secondary | ICD-10-CM | POA: Diagnosis not present

## 2023-11-28 LAB — BASIC METABOLIC PANEL WITH GFR
Anion gap: 12 (ref 5–15)
BUN: 31 mg/dL — ABNORMAL HIGH (ref 8–23)
CO2: 28 mmol/L (ref 22–32)
Calcium: 9.5 mg/dL (ref 8.9–10.3)
Chloride: 104 mmol/L (ref 98–111)
Creatinine, Ser: 0.99 mg/dL (ref 0.44–1.00)
GFR, Estimated: 59 mL/min — ABNORMAL LOW (ref >60)
Glucose, Bld: 270 mg/dL — ABNORMAL HIGH (ref 70–99)
Potassium: 3.1 mmol/L — ABNORMAL LOW (ref 3.5–5.1)
Sodium: 144 mmol/L (ref 135–145)

## 2023-11-28 LAB — CBC
HCT: 32.5 % — ABNORMAL LOW (ref 36.0–46.0)
Hemoglobin: 10.7 g/dL — ABNORMAL LOW (ref 12.0–15.0)
MCH: 30.7 pg (ref 26.0–34.0)
MCHC: 32.9 g/dL (ref 30.0–36.0)
MCV: 93.1 fL (ref 80.0–100.0)
Platelets: 501 K/uL — ABNORMAL HIGH (ref 150–400)
RBC: 3.49 MIL/uL — ABNORMAL LOW (ref 3.87–5.11)
RDW: 13.6 % (ref 11.5–15.5)
WBC: 11.8 K/uL — ABNORMAL HIGH (ref 4.0–10.5)
nRBC: 0 % (ref 0.0–0.2)

## 2023-11-28 LAB — MAGNESIUM: Magnesium: 2.2 mg/dL (ref 1.7–2.4)

## 2023-11-28 MED ORDER — POTASSIUM CHLORIDE CRYS ER 20 MEQ PO TBCR
40.0000 meq | EXTENDED_RELEASE_TABLET | Freq: Two times a day (BID) | ORAL | Status: AC
Start: 2023-11-28 — End: 2023-11-28
  Administered 2023-11-28 (×2): 40 meq via ORAL
  Filled 2023-11-28 (×2): qty 2

## 2023-11-28 NOTE — Progress Notes (Signed)
 Physical Therapy Treatment Patient Details Name: Traci Carter MRN: [id number] DOB: [birth year] Today's Date: 11/28/2023   History of Present Illness Patient is a 78 year old female with sepsis likely due to pneumonia/UTI. PMH: atrial fibrillation, pacemaker for complete heart block, DM, CAD, HTN, ICH, CVA, seizures, fibromyalgia, anxiety/depression, OSA    PT Comments  Patient continues to be self limiting with progression of activity. Minimal participation with bed mobility efforts despite encouragement. Anticipate patient will need rehabilitation < 3 hours/day after this hospital stay. PT will continue to follow.    If plan is discharge home, recommend the following: Two people to help with walking and/or transfers;A lot of help with bathing/dressing/bathroom;Assistance with cooking/housework;Assist for transportation;Help with stairs or ramp for entrance;Direct supervision/assist for medications management   Can travel by private vehicle     No  Equipment Recommendations  Hoyer lift;Wheelchair (measurements PT);BSC/3in1;Hospital bed    Recommendations for Other Services       Precautions / Restrictions Precautions Precautions: Fall Recall of Precautions/Restrictions: Impaired Restrictions Weight Bearing Restrictions Per Provider Order: No     Mobility  Bed Mobility Overal bed mobility: Needs Assistance Bed Mobility: Supine to Sit     Supine to sit: Total assist     General bed mobility comments: patient does not assist with partial sit on edge of bed despite multimodal cues and encouragement. she declined further mobility, no reason specified other that just not wanting to move. patient is able to complete partial long sitting with stand by assistance.    Transfers                        Ambulation/Gait                   Stairs             Wheelchair Mobility     Tilt Bed    Modified Rankin (Stroke Patients Only)        Balance                                            Communication Communication Communication: Impaired Factors Affecting Communication: Difficulty expressing self  Cognition Arousal: Alert Behavior During Therapy: Flat affect   PT - Cognitive impairments: No family/caregiver present to determine baseline                       PT - Cognition Comments: patient continues to get anxious with mobility attempts. maximal encouragement required. confusion about time, situation, recent events. cues for sequencing, initiation Following commands: Impaired Following commands impaired: Follows one step commands with increased time    Cueing Cueing Techniques: Verbal cues, Tactile cues  Exercises      General Comments General comments (skin integrity, edema, etc.): patient sitting up in semi-chair position in bed to promote upright conditioning and readiness for mobility.      Pertinent Vitals/Pain Pain Assessment Pain Assessment: No/denies pain    Home Living                          Prior Function            PT Goals (current goals can now be found in the care plan section) Acute Rehab PT Goals Patient Stated Goal: to feel better PT Goal Formulation:  With patient Time For Goal Achievement: 12/05/23 Potential to Achieve Goals: Fair Progress towards PT goals: Not progressing toward goals - comment    Frequency    Min 1X/week      PT Plan      Co-evaluation              AM-PAC PT 6 Clicks Mobility   Outcome Measure  Help needed turning from your back to your side while in a flat bed without using bedrails?: A Lot Help needed moving from lying on your back to sitting on the side of a flat bed without using bedrails?: A Lot Help needed moving to and from a bed to a chair (including a wheelchair)?: Total Help needed standing up from a chair using your arms (e.g., wheelchair or bedside chair)?: Total Help needed to walk in  hospital room?: Total Help needed climbing 3-5 steps with a railing? : Total 6 Click Score: 8    End of Session   Activity Tolerance:  (limited by willingess to participate) Patient left: in bed;with call bell/phone within reach;with bed alarm set   PT Visit Diagnosis: Muscle weakness (generalized) (M62.81);Unsteadiness on feet (R26.81)     Time: 9079-9054 PT Time Calculation (min) (ACUTE ONLY): 25 min  Charges:    $Therapeutic Activity: 23-37 mins PT General Charges $$ ACUTE PT VISIT: 1 Visit                     Randine Essex, PT, MPT    Randine LULLA Essex 11/28/2023, 9:52 AM

## 2023-11-28 NOTE — Plan of Care (Signed)
  Problem: Education: Goal: Knowledge of General Education information will improve Description: Including pain rating scale, medication(s)/side effects and non-pharmacologic comfort measures Outcome: Progressing   Problem: Health Behavior/Discharge Planning: Goal: Ability to manage health-related needs will improve Outcome: Progressing   Problem: Clinical Measurements: Goal: Ability to maintain clinical measurements within normal limits will improve Outcome: Progressing Goal: Will remain free from infection Outcome: Progressing Goal: Diagnostic test results will improve Outcome: Progressing Goal: Respiratory complications will improve Outcome: Progressing Goal: Cardiovascular complication will be avoided Outcome: Progressing   Problem: Activity: Goal: Risk for activity intolerance will decrease Outcome: Progressing   Problem: Nutrition: Goal: Adequate nutrition will be maintained Outcome: Progressing   Problem: Coping: Goal: Level of anxiety will decrease Outcome: Progressing   Problem: Elimination: Goal: Will not experience complications related to bowel motility Outcome: Progressing Goal: Will not experience complications related to urinary retention Outcome: Progressing   Problem: Pain Managment: Goal: General experience of comfort will improve and/or be controlled Outcome: Progressing   Problem: Safety: Goal: Ability to remain free from injury will improve Outcome: Progressing   Problem: Skin Integrity: Goal: Risk for impaired skin integrity will decrease Outcome: Progressing   Problem: Clinical Measurements: Goal: Ability to maintain a body temperature in the normal range will improve Outcome: Progressing   Problem: Respiratory: Goal: Ability to maintain adequate ventilation will improve Outcome: Progressing Goal: Ability to maintain a clear airway will improve Outcome: Progressing

## 2023-11-28 NOTE — Progress Notes (Signed)
 PROGRESS NOTE   HPI was taken from Dr. Roann: Traci Carter is a pleasant 78 y.o. female with medical history significant for A-fib on Eliquis , DM, dual-chamber pacemaker, ICH, GERD, seizure, anxiety/depression, fibromyalgia who was brought into ED from a nursing facility for evaluation of fevers.  Patient was recently admitted and discharged from hospital on 10/16/2023, required pressors secondary to urinary tract infection.  Patient has dementia and not able to provide any meaningful history. Per EMS patient has been altered for the last couple of days and they had to put her on oxygen.  She normally does not take oxygen at baseline.  According to family member they thought she had CHF and they started her on Lasix .  According to family she had not been normal for couple of days.  Unclear why it took so long for the patient to be transported here.  Today was the first day she had a fever.   ED Course: Upon arrival to the ED, patient is found to have fever at 102.2, tachycardic around 112 and tachypneic around 20 saturating 89%.  Checks x-ray did not show any focal pneumonia rather it shows mild congestive changes.  She was given cefepime , doxycycline , Flagyl  and vancomycin  in the emergency room.  Cultures were sent.  Hospitalist service was consulted for evaluation for admission for possible pneumonia.    Chanese Hartsough  FMW:[id number] DOB: 03-02-1946 DOA: 11/20/2023 PCP: Patient, No Pcp Per   Assessment & Plan:   Principal Problem:   Sepsis (HCC) Active Problems:   Essential hypertension   Paroxysmal atrial fibrillation (HCC)   Hyperlipidemia associated with type 2 diabetes mellitus (HCC)   OSA (obstructive sleep apnea)   Diabetes mellitus type 2, uncomplicated (HCC)   Hyperlipemia  Assessment and Plan: Sepsis: see Dr. Lamount notes on how pt met sepsis criteria. Likely secondary to pneumonia & UTI. Sepsis resolved   Multifocal pneumonia: positive for metapneumovirus.  Possible superimposed bacteria infection. Continue on IV rocephin , bronchodilators, and encourage incentive spirometry. Completed doxy course.  UTI: urine cx growing klebsiella. Continue on IV rocephin    Acute encephalopathy: etiology unclear, possible secondary to above infection. Recently lost her husband of 54 years approx 2 weeks, concern for depression. Improved mental status today but still depressed & not wanting to take an anti-depressant at this time. Repeat CT head was neg for acute intracranial abnormalities. Pacemaker is NOT MRI compatible so MRI was d/c.   Hypernatremia: resolved   Hypokalemia: potassium orderd  Hypomagnesemia: WNL today   Thrombocytopenia: resolved   Thrombocytosis: etiology unclear. Will continue to monitor    CHF: unknown systolic vs diastolic vs combined. Monitor I/Os. Holding home lasix   DM2: well controlled, HbA1c 5.7. Continue on SSI w/ accuchecks    Seizure disorder: continue on IV keppra  and can switch back to po keppra  prior to d/c or when pt is agreeable to take po meds   HTN: continue on increased dose of lisinopril .   HLD: continue on statin   Likely PAF: continue on eliquis        DVT prophylaxis: eliquis  Code Status: full  Family Communication: discussed pt's care w/ pt's daughter, [name], and answered her questions  Disposition Plan: possibly d/c to SNF.   Status is: Inpatient Remains inpatient appropriate because: needs SNF placement, pt wanted to think it over but pt's daughter wants the pt to go to SNF     Level of care: Telemetry Medical Consultants:    Procedures:  Antimicrobials: rocephin    Subjective: Pt is  c/o feeling sad   Objective: Vitals:   11/27/23 1510 11/27/23 2053 11/28/23 0445 11/28/23 0750  BP: (!) 163/99 (!) 157/95 (!) 145/80 (!) 167/95  Pulse: 86 (!) 108 97 91  Resp: 17 15 15 18   Temp: 98.2 F (36.8 C) 98.6 F (37 C) 98.2 F (36.8 C) 98.2 F (36.8 C)  TempSrc:      SpO2: 93% 93% 93% 92%   Weight:      Height:        Intake/Output Summary (Last 24 hours) at 11/28/2023 0927 Last data filed at 11/27/2023 1708 Gross per 24 hour  Intake 190 ml  Output --  Net 190 ml   Filed Weights   11/20/23 0753 11/20/23 1752  Weight: 81.6 kg 99.7 kg    Examination:  General exam: appears depressed Respiratory system: diminished breath sounds b/l  Cardiovascular system: S1 & S2+. No rubs or clicks  Gastrointestinal system: abd is soft, NT, ND & hypoactive bowel sounds Central nervous system: alert & awake. Moves all extremities  Psychiatry: judgement and insight appears improved. Depressed mood and affect    Data Reviewed: I have personally reviewed following labs and imaging studies  CBC: Recent Labs  Lab 11/24/23 0709 11/25/23 0510 11/26/23 0436 11/27/23 0546 11/28/23 0536  WBC 5.6 7.1 9.8 12.8* 11.8*  HGB 9.6* 10.8* 10.5* 11.2* 10.7*  HCT 28.5* 32.8* 31.7* 33.7* 32.5*  MCV 91.3 92.1 92.4 91.6 93.1  PLT 227 347 417* 523* 501*   Basic Metabolic Panel: Recent Labs  Lab 11/24/23 0709 11/25/23 0510 11/26/23 0436 11/27/23 0546 11/28/23 0536  NA 145 147* 147* 144 144  K 3.0* 3.2* 3.0* 3.4* 3.1*  CL 101 103 101 102 104  CO2 28 29 28 29 28   GLUCOSE 154* 204* 282* 281* 270*  BUN 38* 38* 34* 29* 31*  CREATININE 1.24* 1.27* 1.10* 1.03* 0.99  CALCIUM  8.3* 9.0 8.9 9.3 9.5  MG  --   --  1.2*  --  2.2   GFR: Estimated Creatinine Clearance: 54.4 mL/min (by C-G formula based on SCr of 0.99 mg/dL). Liver Function Tests: No results for input(s): AST, ALT, ALKPHOS, BILITOT, PROT, ALBUMIN  in the last 168 hours.  No results for input(s): LIPASE, AMYLASE in the last 168 hours. Recent Labs  Lab 11/27/23 0546  AMMONIA 15   Coagulation Profile: No results for input(s): INR, PROTIME in the last 168 hours.  Cardiac Enzymes: No results for input(s): CKTOTAL, CKMB, CKMBINDEX, TROPONINI in the last 168 hours. BNP (last 3 results) No results for  input(s): PROBNP in the last 8760 hours. HbA1C: No results for input(s): HGBA1C in the last 72 hours. CBG: Recent Labs  Lab 11/21/23 2015  GLUCAP 125*   Lipid Profile: No results for input(s): CHOL, HDL, LDLCALC, TRIG, CHOLHDL, LDLDIRECT in the last 72 hours. Thyroid Function Tests: No results for input(s): TSH, T4TOTAL, FREET4, T3FREE, THYROIDAB in the last 72 hours. Anemia Panel: No results for input(s): VITAMINB12, FOLATE, FERRITIN, TIBC, IRON, RETICCTPCT in the last 72 hours. Sepsis Labs: No results for input(s): PROCALCITON, LATICACIDVEN in the last 168 hours.   Recent Results (from the past 240 hours)  Blood Culture (routine x 2)     Status: None   Collection Time: 11/20/23  8:20 AM   Specimen: BLOOD  Result Value Ref Range Status   Specimen Description BLOOD LEFT ANTECUBITAL  Final   Special Requests   Final    BOTTLES DRAWN AEROBIC AND ANAEROBIC Blood Culture adequate volume   Culture  Final    NO GROWTH 5 DAYS Performed at [facility], [address] [place], [place] [address]    Report Status 11/25/2023 FINAL  Final  Resp panel by RT-PCR (RSV, Flu A&B, Covid) Anterior Nasal Swab     Status: None   Collection Time: 11/20/23  8:21 AM   Specimen: Anterior Nasal Swab  Result Value Ref Range Status   SARS Coronavirus 2 by RT PCR NEGATIVE NEGATIVE Final    Comment: (NOTE) SARS-CoV-2 target nucleic acids are NOT DETECTED.  The SARS-CoV-2 RNA is generally detectable in upper respiratory specimens during the acute phase of infection. The lowest concentration of SARS-CoV-2 viral copies this assay can detect is 138 copies/mL. A negative result does not preclude SARS-Cov-2 infection and should not be used as the sole basis for treatment or other patient management decisions. A negative result may occur with  improper specimen collection/handling, submission of specimen other than nasopharyngeal swab, presence of viral  mutation(s) within the areas targeted by this assay, and inadequate number of viral copies(<138 copies/mL). A negative result must be combined with clinical observations, patient history, and epidemiological information. The expected result is Negative.  Fact Sheet for Patients:  BloggerCourse.com  Fact Sheet for Healthcare Providers:  SeriousBroker.it  This test is no t yet approved or cleared by the [place]  FDA and  has been authorized for detection and/or diagnosis of SARS-CoV-2 by FDA under an Emergency Use Authorization (EUA). This EUA will remain  in effect (meaning this test can be used) for the duration of the COVID-19 declaration under Section 564(b)(1) of the Act, 21 U.S.C.section 360bbb-3(b)(1), unless the authorization is terminated  or revoked sooner.       Influenza A by PCR NEGATIVE NEGATIVE Final   Influenza B by PCR NEGATIVE NEGATIVE Final    Comment: (NOTE) The Xpert Xpress SARS-CoV-2/FLU/RSV plus assay is intended as an aid in the diagnosis of influenza from Nasopharyngeal swab specimens and should not be used as a sole basis for treatment. Nasal washings and aspirates are unacceptable for Xpert Xpress SARS-CoV-2/FLU/RSV testing.  Fact Sheet for Patients: BloggerCourse.com  Fact Sheet for Healthcare Providers: SeriousBroker.it  This test is not yet approved or cleared by the [place]  FDA and has been authorized for detection and/or diagnosis of SARS-CoV-2 by FDA under an Emergency Use Authorization (EUA). This EUA will remain in effect (meaning this test can be used) for the duration of the COVID-19 declaration under Section 564(b)(1) of the Act, 21 U.S.C. section 360bbb-3(b)(1), unless the authorization is terminated or revoked.     Resp Syncytial Virus by PCR NEGATIVE NEGATIVE Final    Comment: (NOTE) Fact Sheet for  Patients: BloggerCourse.com  Fact Sheet for Healthcare Providers: SeriousBroker.it  This test is not yet approved or cleared by the [place]  FDA and has been authorized for detection and/or diagnosis of SARS-CoV-2 by FDA under an Emergency Use Authorization (EUA). This EUA will remain in effect (meaning this test can be used) for the duration of the COVID-19 declaration under Section 564(b)(1) of the Act, 21 U.S.C. section 360bbb-3(b)(1), unless the authorization is terminated or revoked.  Performed at [facility], [address] [place], [place] [address]   Urine Culture     Status: Abnormal   Collection Time: 11/20/23  8:21 AM   Specimen: Urine, Random  Result Value Ref Range Status   Specimen Description   Final    URINE, RANDOM Performed at [facility], [address], [place], [place]  [address]    Special Requests   Final    NONE Reflexed from [id number] Performed at [facility], [address] [place], [place] [address]    Culture >=100,000 COLONIES/mL KLEBSIELLA PNEUMONIAE (A)  Final   Report Status 11/22/2023 FINAL  Final   Organism ID, Bacteria KLEBSIELLA PNEUMONIAE (A)  Final      Susceptibility   Klebsiella pneumoniae - MIC*    AMPICILLIN >=32 RESISTANT Resistant     CEFAZOLIN  (URINE) Value in next row Sensitive      4 SENSITIVEThis is a modified FDA-approved test that has been validated and its performance characteristics determined by the reporting laboratory.  This laboratory is certified under the Clinical Laboratory Improvement Amendments CLIA as qualified to perform high complexity clinical laboratory testing.    CEFEPIME  Value in next row Sensitive      4 SENSITIVEThis is a modified FDA-approved test that has been validated and its performance characteristics determined by the reporting laboratory.  This laboratory is certified under the Clinical Laboratory Improvement  Amendments CLIA as qualified to perform high complexity clinical laboratory testing.    ERTAPENEM Value in next row Sensitive      4 SENSITIVEThis is a modified FDA-approved test that has been validated and its performance characteristics determined by the reporting laboratory.  This laboratory is certified under the Clinical Laboratory Improvement Amendments CLIA as qualified to perform high complexity clinical laboratory testing.    CEFTRIAXONE  Value in next row Sensitive      4 SENSITIVEThis is a modified FDA-approved test that has been validated and its performance characteristics determined by the reporting laboratory.  This laboratory is certified under the Clinical Laboratory Improvement Amendments CLIA as qualified to perform high complexity clinical laboratory testing.    CIPROFLOXACIN Value in next row Resistant      4 SENSITIVEThis is a modified FDA-approved test that has been validated and its performance characteristics determined by the reporting laboratory.  This laboratory is certified under the Clinical Laboratory Improvement Amendments CLIA as qualified to perform high complexity clinical laboratory testing.    GENTAMICIN Value in next row Resistant      4 SENSITIVEThis is a modified FDA-approved test that has been validated and its performance characteristics determined by the reporting laboratory.  This laboratory is certified under the Clinical Laboratory Improvement Amendments CLIA as qualified to perform high complexity clinical laboratory testing.    NITROFURANTOIN Value in next row Resistant      4 SENSITIVEThis is a modified FDA-approved test that has been validated and its performance characteristics determined by the reporting laboratory.  This laboratory is certified under the Clinical Laboratory Improvement Amendments CLIA as qualified to perform high complexity clinical laboratory testing.    TRIMETH/SULFA Value in next row Sensitive      4 SENSITIVEThis is a modified  FDA-approved test that has been validated and its performance characteristics determined by the reporting laboratory.  This laboratory is certified under the Clinical Laboratory Improvement Amendments CLIA as qualified to perform high complexity clinical laboratory testing.    AMPICILLIN/SULBACTAM Value in next row Resistant      4 SENSITIVEThis is a modified FDA-approved test that has been validated and its performance characteristics determined by the reporting laboratory.  This laboratory is certified under the Clinical Laboratory Improvement Amendments CLIA as qualified to perform high complexity clinical laboratory testing.    PIP/TAZO Value in next row Sensitive ug/mL     16 SENSITIVEThis is a modified FDA-approved test that has been validated  and its performance characteristics determined by the reporting laboratory.  This laboratory is certified under the Clinical Laboratory Improvement Amendments CLIA as qualified to perform high complexity clinical laboratory testing.    MEROPENEM Value in next row Sensitive      16 SENSITIVEThis is a modified FDA-approved test that has been validated and its performance characteristics determined by the reporting laboratory.  This laboratory is certified under the Clinical Laboratory Improvement Amendments CLIA as qualified to perform high complexity clinical laboratory testing.    * >=100,000 COLONIES/mL KLEBSIELLA PNEUMONIAE  Blood Culture (routine x 2)     Status: None   Collection Time: 11/20/23  8:26 AM   Specimen: BLOOD  Result Value Ref Range Status   Specimen Description BLOOD BLOOD RIGHT WRIST  Final   Special Requests   Final    BOTTLES DRAWN AEROBIC AND ANAEROBIC Blood Culture results may not be optimal due to an inadequate volume of blood received in culture bottles   Culture   Final    NO GROWTH 5 DAYS Performed at [facility], [address] [place], [place] [address]    Report Status 11/25/2023 FINAL  Final  MRSA Next Gen by  PCR, Nasal     Status: None   Collection Time: 11/20/23  8:47 AM   Specimen: Nasal Mucosa; Nasal Swab  Result Value Ref Range Status   MRSA by PCR Next Gen NOT DETECTED NOT DETECTED Final    Comment: (NOTE) The GeneXpert MRSA Assay (FDA approved for NASAL specimens only), is one component of a comprehensive MRSA colonization surveillance program. It is not intended to diagnose MRSA infection nor to guide or monitor treatment for MRSA infections. Test performance is not FDA approved in patients less than 75 years old. Performed at [facility], [address] [place], [place] [address]   Respiratory (~20 pathogens) panel by PCR     Status: Abnormal   Collection Time: 11/21/23  8:47 AM   Specimen: Nasopharyngeal Swab; Respiratory  Result Value Ref Range Status   Adenovirus NOT DETECTED NOT DETECTED Final   Coronavirus 229E NOT DETECTED NOT DETECTED Final    Comment: (NOTE) The Coronavirus on the Respiratory Panel, DOES NOT test for the novel  Coronavirus (2019 nCoV)    Coronavirus HKU1 NOT DETECTED NOT DETECTED Final   Coronavirus NL63 NOT DETECTED NOT DETECTED Final   Coronavirus OC43 NOT DETECTED NOT DETECTED Final   Metapneumovirus DETECTED (A) NOT DETECTED Final   Rhinovirus / Enterovirus NOT DETECTED NOT DETECTED Final   Influenza A NOT DETECTED NOT DETECTED Final   Influenza B NOT DETECTED NOT DETECTED Final   Parainfluenza Virus 1 NOT DETECTED NOT DETECTED Final   Parainfluenza Virus 2 NOT DETECTED NOT DETECTED Final   Parainfluenza Virus 3 NOT DETECTED NOT DETECTED Final   Parainfluenza Virus 4 NOT DETECTED NOT DETECTED Final   Respiratory Syncytial Virus NOT DETECTED NOT DETECTED Final   Bordetella pertussis NOT DETECTED NOT DETECTED Final   Bordetella Parapertussis NOT DETECTED NOT DETECTED Final   Chlamydophila pneumoniae NOT DETECTED NOT DETECTED Final   Mycoplasma pneumoniae NOT DETECTED NOT DETECTED Final    Comment: Performed at [facility],  [address], [place], [place] [address]         Radiology Studies: No results found.       Scheduled Meds:  apixaban   5 mg Oral BID   atorvastatin   20 mg Oral Daily   levETIRAcetam   1,000 mg Intravenous Q12H  lisinopril   5 mg Oral Daily   potassium chloride   40 mEq Oral BID   QUEtiapine   25-50 mg Oral QHS   Continuous Infusions:  cefTRIAXone  (ROCEPHIN )  IV 1 g (11/27/23 2051)     LOS: 8 days       [name], MD [facility] Pager [phone]  If 7PM-7AM, please contact night-coverage [email or web address] 11/28/2023, 9:27 AM

## 2023-11-28 NOTE — TOC Progression Note (Addendum)
 Transition of Care [facility]) - Progression Note    Patient Details  Name: Traci Carter MRN: [id number] Date of Birth: March 01, 1946  Transition [facility]) CM/SW Contact  Dalia GORMAN Fuse, RN Phone Number: 11/28/2023, 3:05 PM  Clinical Narrative:    Patient reamains in house on IV abx for multifocal PNA.  SNF bed offers from [facility] and [facility]. TOC lvmm with patient's daughter, Shykeria Sakamoto [phone], requesting callback.       Barriers to Discharge: Continued Medical Work up               Expected Discharge Plan and Services       Living arrangements for the past 2 months: Single Family Home                                       Social Drivers of Health (SDOH) Interventions SDOH Screenings   Food Insecurity: No Food Insecurity (11/20/2023)  Housing: Low Risk  (11/20/2023)  Transportation Needs: No Transportation Needs (11/20/2023)  Utilities: Not At Risk (11/20/2023)  Financial Resource Strain: Low Risk  (09/25/2020)   Received from [facility]  Social Connections: Moderately Isolated (11/20/2023)  Tobacco Use: Low Risk  (11/20/2023)    Readmission Risk Interventions    10/17/2023    3:58 PM  Readmission Risk Prevention Plan  PCP or Specialist Appt within 3-5 Days Complete  Social Work Consult for Recovery Care Planning/Counseling Complete  Palliative Care Screening Not Applicable

## 2023-11-29 DIAGNOSIS — I10 Essential (primary) hypertension: Secondary | ICD-10-CM | POA: Diagnosis not present

## 2023-11-29 DIAGNOSIS — E119 Type 2 diabetes mellitus without complications: Secondary | ICD-10-CM

## 2023-11-29 DIAGNOSIS — E1169 Type 2 diabetes mellitus with other specified complication: Secondary | ICD-10-CM

## 2023-11-29 DIAGNOSIS — E785 Hyperlipidemia, unspecified: Secondary | ICD-10-CM | POA: Diagnosis not present

## 2023-11-29 DIAGNOSIS — A419 Sepsis, unspecified organism: Secondary | ICD-10-CM | POA: Diagnosis not present

## 2023-11-29 DIAGNOSIS — I480 Paroxysmal atrial fibrillation: Secondary | ICD-10-CM

## 2023-11-29 DIAGNOSIS — G4733 Obstructive sleep apnea (adult) (pediatric): Secondary | ICD-10-CM

## 2023-11-29 LAB — CBC
HCT: 32.6 % — ABNORMAL LOW (ref 36.0–46.0)
Hemoglobin: 10.7 g/dL — ABNORMAL LOW (ref 12.0–15.0)
MCH: 30.6 pg (ref 26.0–34.0)
MCHC: 32.8 g/dL (ref 30.0–36.0)
MCV: 93.1 fL (ref 80.0–100.0)
Platelets: 500 K/uL — ABNORMAL HIGH (ref 150–400)
RBC: 3.5 MIL/uL — ABNORMAL LOW (ref 3.87–5.11)
RDW: 13.8 % (ref 11.5–15.5)
WBC: 10.9 K/uL — ABNORMAL HIGH (ref 4.0–10.5)
nRBC: 0 % (ref 0.0–0.2)

## 2023-11-29 LAB — BASIC METABOLIC PANEL WITH GFR
Anion gap: 15 (ref 5–15)
BUN: 29 mg/dL — ABNORMAL HIGH (ref 8–23)
CO2: 26 mmol/L (ref 22–32)
Calcium: 9.8 mg/dL (ref 8.9–10.3)
Chloride: 104 mmol/L (ref 98–111)
Creatinine, Ser: 1.02 mg/dL — ABNORMAL HIGH (ref 0.44–1.00)
GFR, Estimated: 57 mL/min — ABNORMAL LOW (ref >60)
Glucose, Bld: 262 mg/dL — ABNORMAL HIGH (ref 70–99)
Potassium: 3.8 mmol/L (ref 3.5–5.1)
Sodium: 145 mmol/L (ref 135–145)

## 2023-11-29 LAB — MAGNESIUM: Magnesium: 1.9 mg/dL (ref 1.7–2.4)

## 2023-11-29 MED ORDER — ENSURE PLUS HIGH PROTEIN PO LIQD: 237.0000 mL | Freq: Two times a day (BID) | ORAL | Status: DC

## 2023-11-29 MED ORDER — LISINOPRIL 20 MG PO TABS
20.0000 mg | ORAL_TABLET | Freq: Every day | ORAL | Status: DC
  Administered 2023-11-30 – 2023-12-01 (×2): 20 mg via ORAL
  Filled 2023-11-29 (×2): qty 1

## 2023-11-29 NOTE — Progress Notes (Signed)
 PROGRESS NOTE  Traci Carter  FMW:[id number] DOB: 09/25/1945 DOA: 11/20/2023 PCP: Patient, No Pcp Per   HPI was taken from Dr. Roann: Traci Carter is a pleasant 78 y.o. female with medical history significant for A-fib on Eliquis , DM, dual-chamber pacemaker, ICH, GERD, seizure, anxiety/depression, fibromyalgia who was brought into ED from a nursing facility for evaluation of fevers.  Patient was recently admitted and discharged from hospital on 10/16/2023, required pressors secondary to urinary tract infection.  Patient has dementia and not able to provide any meaningful history. Per EMS patient has been altered for the last couple of days and they had to put her on oxygen.  She normally does not take oxygen at baseline.  According to family member they thought she had CHF and they started her on Lasix .  According to family she had not been normal for couple of days.  Unclear why it took so long for the patient to be transported here.  Today was the first day she had a fever.   ED Course: Upon arrival to the ED, patient is found to have fever at 102.2, tachycardic around 112 and tachypneic around 20 saturating 89%.  Checks x-ray did not show any focal pneumonia rather it shows mild congestive changes.  She was given cefepime , doxycycline , Flagyl  and vancomycin  in the emergency room.  Cultures were sent.  Hospitalist service was consulted for evaluation for admission for possible pneumonia.  8/27: Hemodynamically stable, completed the course of antibiotic for pneumonia and UTI.  Awaiting placement  Assessment & Plan:   Principal Problem:   Sepsis (HCC) Active Problems:   Essential hypertension   Paroxysmal atrial fibrillation (HCC)   Hyperlipidemia associated with type 2 diabetes mellitus (HCC)   OSA (obstructive sleep apnea)   Diabetes mellitus type 2, uncomplicated (HCC)   Hyperlipemia  Assessment and Plan: Sepsis: see Dr. Lamount notes on how pt met sepsis criteria. Likely  secondary to pneumonia & UTI. Sepsis resolved   Multifocal pneumonia: positive for metapneumovirus. Possible superimposed bacteria infection. Continue on IV rocephin , bronchodilators, and encourage incentive spirometry. Completed Abx  UTI: urine cx growing klebsiella. Completed ABx  Acute encephalopathy: etiology unclear, possible secondary to above infection. Recently lost her husband of 54 years approx 2 weeks, concern for depression. Improved mental status today but still depressed & not wanting to take an anti-depressant at this time. Repeat CT head was neg for acute intracranial abnormalities. Pacemaker is NOT MRI compatible so MRI was d/c.  Mentation now close to baseline.  Hypernatremia: resolved   Hypokalemia.  Resolved  Hypomagnesemia: WNL today   Thrombocytosis: etiology unclear.  Likely reactive -Continue to monitor   Chronic HFpEF.  Clinically appears euvolemic.  Holding home lasix   DM2: well controlled, HbA1c 5.7. Continue on SSI w/ accuchecks    Seizure disorder: continue on IV keppra  and can switch back to po keppra  prior to d/c or when pt is agreeable to take po meds   HTN: Blood pressure elevated - Increasing the dose of lisinopril   HLD: continue on statin   Likely PAF: continue on eliquis    DVT prophylaxis: eliquis  Code Status: full  Family Communication: Discussed with daughter on phone Disposition Plan:  SNF.   Status is: Inpatient Remains inpatient appropriate because: needs SNF placement, pt wanted to think it over but pt's daughter wants the pt to go to SNF   Level of care: Telemetry Medical Consultants:    Procedures:  Antimicrobials: rocephin    Subjective: Patient was seen and examined today.,  Still having mild cough.  No new concern.  Objective: Vitals:   11/28/23 0750 11/28/23 1633 11/28/23 1925 11/29/23 0804  BP: (!) 167/95 (!) 151/83 (!) 141/84 (!) 150/88  Pulse: 91 91 94 92  Resp: 18 16 15 16   Temp: 98.2 F (36.8 C) 98 F (36.7  C) 97.7 F (36.5 C) 97.6 F (36.4 C)  TempSrc:  Oral    SpO2: 92% 94% 93% 93%  Weight:      Height:        Intake/Output Summary (Last 24 hours) at 11/29/2023 1543 Last data filed at 11/29/2023 0645 Gross per 24 hour  Intake --  Output 325 ml  Net -325 ml   Filed Weights   11/20/23 0753 11/20/23 1752  Weight: 81.6 kg 99.7 kg    Examination:  General.  Frail elderly lady, in no acute distress. Pulmonary.  Lungs clear bilaterally, normal respiratory effort. CV.  Regular rate and rhythm, no JVD, rub or murmur. Abdomen.  Soft, nontender, nondistended, BS positive. CNS.  Alert and oriented .  No focal neurologic deficit. Extremities.  No edema,pulses intact and symmetrical. Psychiatry.  Judgment and insight appears normal.    Data Reviewed: I have personally reviewed following labs and imaging studies  CBC: Recent Labs  Lab 11/25/23 0510 11/26/23 0436 11/27/23 0546 11/28/23 0536 11/29/23 0551  WBC 7.1 9.8 12.8* 11.8* 10.9*  HGB 10.8* 10.5* 11.2* 10.7* 10.7*  HCT 32.8* 31.7* 33.7* 32.5* 32.6*  MCV 92.1 92.4 91.6 93.1 93.1  PLT 347 417* 523* 501* 500*   Basic Metabolic Panel: Recent Labs  Lab 11/25/23 0510 11/26/23 0436 11/27/23 0546 11/28/23 0536 11/29/23 0551  NA 147* 147* 144 144 145  K 3.2* 3.0* 3.4* 3.1* 3.8  CL 103 101 102 104 104  CO2 29 28 29 28 26   GLUCOSE 204* 282* 281* 270* 262*  BUN 38* 34* 29* 31* 29*  CREATININE 1.27* 1.10* 1.03* 0.99 1.02*  CALCIUM  9.0 8.9 9.3 9.5 9.8  MG  --  1.2*  --  2.2 1.9   GFR: Estimated Creatinine Clearance: 52.8 mL/min (A) (by C-G formula based on SCr of 1.02 mg/dL (H)). Liver Function Tests: No results for input(s): AST, ALT, ALKPHOS, BILITOT, PROT, ALBUMIN  in the last 168 hours.  No results for input(s): LIPASE, AMYLASE in the last 168 hours. Recent Labs  Lab 11/27/23 0546  AMMONIA 15   Coagulation Profile: No results for input(s): INR, PROTIME in the last 168 hours.  Cardiac  Enzymes: No results for input(s): CKTOTAL, CKMB, CKMBINDEX, TROPONINI in the last 168 hours. BNP (last 3 results) No results for input(s): PROBNP in the last 8760 hours. HbA1C: No results for input(s): HGBA1C in the last 72 hours. CBG: No results for input(s): GLUCAP in the last 168 hours.  Lipid Profile: No results for input(s): CHOL, HDL, LDLCALC, TRIG, CHOLHDL, LDLDIRECT in the last 72 hours. Thyroid Function Tests: No results for input(s): TSH, T4TOTAL, FREET4, T3FREE, THYROIDAB in the last 72 hours. Anemia Panel: No results for input(s): VITAMINB12, FOLATE, FERRITIN, TIBC, IRON, RETICCTPCT in the last 72 hours. Sepsis Labs: No results for input(s): PROCALCITON, LATICACIDVEN in the last 168 hours.   Recent Results (from the past 240 hours)  Blood Culture (routine x 2)     Status: None   Collection Time: 11/20/23  8:20 AM   Specimen: BLOOD  Result Value Ref Range Status   Specimen Description BLOOD LEFT ANTECUBITAL  Final   Special Requests   Final    BOTTLES  DRAWN AEROBIC AND ANAEROBIC Blood Culture adequate volume   Culture   Final    NO GROWTH 5 DAYS Performed at [facility], [address] [place], [place] [address]    Report Status 11/25/2023 FINAL  Final  Resp panel by RT-PCR (RSV, Flu A&B, Covid) Anterior Nasal Swab     Status: None   Collection Time: 11/20/23  8:21 AM   Specimen: Anterior Nasal Swab  Result Value Ref Range Status   SARS Coronavirus 2 by RT PCR NEGATIVE NEGATIVE Final    Comment: (NOTE) SARS-CoV-2 target nucleic acids are NOT DETECTED.  The SARS-CoV-2 RNA is generally detectable in upper respiratory specimens during the acute phase of infection. The lowest concentration of SARS-CoV-2 viral copies this assay can detect is 138 copies/mL. A negative result does not preclude SARS-Cov-2 infection and should not be used as the sole basis for treatment or other patient management  decisions. A negative result may occur with  improper specimen collection/handling, submission of specimen other than nasopharyngeal swab, presence of viral mutation(s) within the areas targeted by this assay, and inadequate number of viral copies(<138 copies/mL). A negative result must be combined with clinical observations, patient history, and epidemiological information. The expected result is Negative.  Fact Sheet for Patients:  BloggerCourse.com  Fact Sheet for Healthcare Providers:  SeriousBroker.it  This test is no t yet approved or cleared by the [place]  FDA and  has been authorized for detection and/or diagnosis of SARS-CoV-2 by FDA under an Emergency Use Authorization (EUA). This EUA will remain  in effect (meaning this test can be used) for the duration of the COVID-19 declaration under Section 564(b)(1) of the Act, 21 U.S.C.section 360bbb-3(b)(1), unless the authorization is terminated  or revoked sooner.       Influenza A by PCR NEGATIVE NEGATIVE Final   Influenza B by PCR NEGATIVE NEGATIVE Final    Comment: (NOTE) The Xpert Xpress SARS-CoV-2/FLU/RSV plus assay is intended as an aid in the diagnosis of influenza from Nasopharyngeal swab specimens and should not be used as a sole basis for treatment. Nasal washings and aspirates are unacceptable for Xpert Xpress SARS-CoV-2/FLU/RSV testing.  Fact Sheet for Patients: BloggerCourse.com  Fact Sheet for Healthcare Providers: SeriousBroker.it  This test is not yet approved or cleared by the [place]  FDA and has been authorized for detection and/or diagnosis of SARS-CoV-2 by FDA under an Emergency Use Authorization (EUA). This EUA will remain in effect (meaning this test can be used) for the duration of the COVID-19 declaration under Section 564(b)(1) of the Act, 21 U.S.C. section 360bbb-3(b)(1), unless the  authorization is terminated or revoked.     Resp Syncytial Virus by PCR NEGATIVE NEGATIVE Final    Comment: (NOTE) Fact Sheet for Patients: BloggerCourse.com  Fact Sheet for Healthcare Providers: SeriousBroker.it  This test is not yet approved or cleared by the [place]  FDA and has been authorized for detection and/or diagnosis of SARS-CoV-2 by FDA under an Emergency Use Authorization (EUA). This EUA will remain in effect (meaning this test can be used) for the duration of the COVID-19 declaration under Section 564(b)(1) of the Act, 21 U.S.C. section 360bbb-3(b)(1), unless the authorization is terminated or revoked.  Performed at [facility], [address], [place], [place] [address]   Urine Culture     Status: Abnormal   Collection Time: 11/20/23  8:21 AM   Specimen: Urine, Random  Result Value Ref Range Status   Specimen Description   Final  URINE, RANDOM Performed at [facility], [address] [place], [place] [address]    Special Requests   Final    NONE Reflexed from [phone] Performed at [facility], [address] [place], [place] [address]    Culture >=100,000 COLONIES/mL KLEBSIELLA PNEUMONIAE (A)  Final   Report Status 11/22/2023 FINAL  Final   Organism ID, Bacteria KLEBSIELLA PNEUMONIAE (A)  Final      Susceptibility   Klebsiella pneumoniae - MIC*    AMPICILLIN >=32 RESISTANT Resistant     CEFAZOLIN  (URINE) Value in next row Sensitive      4 SENSITIVEThis is a modified FDA-approved test that has been validated and its performance characteristics determined by the reporting laboratory.  This laboratory is certified under the Clinical Laboratory Improvement Amendments CLIA as qualified to perform high complexity clinical laboratory testing.    CEFEPIME  Value in next row Sensitive      4 SENSITIVEThis is a modified FDA-approved test that has been validated and its performance  characteristics determined by the reporting laboratory.  This laboratory is certified under the Clinical Laboratory Improvement Amendments CLIA as qualified to perform high complexity clinical laboratory testing.    ERTAPENEM Value in next row Sensitive      4 SENSITIVEThis is a modified FDA-approved test that has been validated and its performance characteristics determined by the reporting laboratory.  This laboratory is certified under the Clinical Laboratory Improvement Amendments CLIA as qualified to perform high complexity clinical laboratory testing.    CEFTRIAXONE  Value in next row Sensitive      4 SENSITIVEThis is a modified FDA-approved test that has been validated and its performance characteristics determined by the reporting laboratory.  This laboratory is certified under the Clinical Laboratory Improvement Amendments CLIA as qualified to perform high complexity clinical laboratory testing.    CIPROFLOXACIN Value in next row Resistant      4 SENSITIVEThis is a modified FDA-approved test that has been validated and its performance characteristics determined by the reporting laboratory.  This laboratory is certified under the Clinical Laboratory Improvement Amendments CLIA as qualified to perform high complexity clinical laboratory testing.    GENTAMICIN Value in next row Resistant      4 SENSITIVEThis is a modified FDA-approved test that has been validated and its performance characteristics determined by the reporting laboratory.  This laboratory is certified under the Clinical Laboratory Improvement Amendments CLIA as qualified to perform high complexity clinical laboratory testing.    NITROFURANTOIN Value in next row Resistant      4 SENSITIVEThis is a modified FDA-approved test that has been validated and its performance characteristics determined by the reporting laboratory.  This laboratory is certified under the Clinical Laboratory Improvement Amendments CLIA as qualified to perform high  complexity clinical laboratory testing.    TRIMETH/SULFA Value in next row Sensitive      4 SENSITIVEThis is a modified FDA-approved test that has been validated and its performance characteristics determined by the reporting laboratory.  This laboratory is certified under the Clinical Laboratory Improvement Amendments CLIA as qualified to perform high complexity clinical laboratory testing.    AMPICILLIN/SULBACTAM Value in next row Resistant      4 SENSITIVEThis is a modified FDA-approved test that has been validated and its performance characteristics determined by the reporting laboratory.  This laboratory is certified under the Clinical Laboratory Improvement Amendments CLIA as qualified to perform high complexity clinical laboratory testing.    PIP/TAZO Value in next row Sensitive ug/mL  16 SENSITIVEThis is a modified FDA-approved test that has been validated and its performance characteristics determined by the reporting laboratory.  This laboratory is certified under the Clinical Laboratory Improvement Amendments CLIA as qualified to perform high complexity clinical laboratory testing.    MEROPENEM Value in next row Sensitive      16 SENSITIVEThis is a modified FDA-approved test that has been validated and its performance characteristics determined by the reporting laboratory.  This laboratory is certified under the Clinical Laboratory Improvement Amendments CLIA as qualified to perform high complexity clinical laboratory testing.    * >=100,000 COLONIES/mL KLEBSIELLA PNEUMONIAE  Blood Culture (routine x 2)     Status: None   Collection Time: 11/20/23  8:26 AM   Specimen: BLOOD  Result Value Ref Range Status   Specimen Description BLOOD BLOOD RIGHT WRIST  Final   Special Requests   Final    BOTTLES DRAWN AEROBIC AND ANAEROBIC Blood Culture results may not be optimal due to an inadequate volume of blood received in culture bottles   Culture   Final    NO GROWTH 5 DAYS Performed at  [facility], [address] [place], [place] [address]    Report Status 11/25/2023 FINAL  Final  MRSA Next Gen by PCR, Nasal     Status: None   Collection Time: 11/20/23  8:47 AM   Specimen: Nasal Mucosa; Nasal Swab  Result Value Ref Range Status   MRSA by PCR Next Gen NOT DETECTED NOT DETECTED Final    Comment: (NOTE) The GeneXpert MRSA Assay (FDA approved for NASAL specimens only), is one component of a comprehensive MRSA colonization surveillance program. It is not intended to diagnose MRSA infection nor to guide or monitor treatment for MRSA infections. Test performance is not FDA approved in patients less than 58 years old. Performed at [facility], [address] [place], [place] [address]   Respiratory (~20 pathogens) panel by PCR     Status: Abnormal   Collection Time: 11/21/23  8:47 AM   Specimen: Nasopharyngeal Swab; Respiratory  Result Value Ref Range Status   Adenovirus NOT DETECTED NOT DETECTED Final   Coronavirus 229E NOT DETECTED NOT DETECTED Final    Comment: (NOTE) The Coronavirus on the Respiratory Panel, DOES NOT test for the novel  Coronavirus (2019 nCoV)    Coronavirus HKU1 NOT DETECTED NOT DETECTED Final   Coronavirus NL63 NOT DETECTED NOT DETECTED Final   Coronavirus OC43 NOT DETECTED NOT DETECTED Final   Metapneumovirus DETECTED (A) NOT DETECTED Final   Rhinovirus / Enterovirus NOT DETECTED NOT DETECTED Final   Influenza A NOT DETECTED NOT DETECTED Final   Influenza B NOT DETECTED NOT DETECTED Final   Parainfluenza Virus 1 NOT DETECTED NOT DETECTED Final   Parainfluenza Virus 2 NOT DETECTED NOT DETECTED Final   Parainfluenza Virus 3 NOT DETECTED NOT DETECTED Final   Parainfluenza Virus 4 NOT DETECTED NOT DETECTED Final   Respiratory Syncytial Virus NOT DETECTED NOT DETECTED Final   Bordetella pertussis NOT DETECTED NOT DETECTED Final   Bordetella Parapertussis NOT DETECTED NOT DETECTED Final   Chlamydophila pneumoniae NOT  DETECTED NOT DETECTED Final   Mycoplasma pneumoniae NOT DETECTED NOT DETECTED Final    Comment: Performed at [facility], [address] [place], [place] [address]     Radiology Studies: No results found.  Scheduled Meds:  apixaban   5 mg Oral BID   atorvastatin   20 mg Oral Daily   levETIRAcetam   1,000 mg Intravenous Q12H  lisinopril   5 mg Oral Daily   QUEtiapine   25-50 mg Oral QHS   Continuous Infusions:   LOS: 9 days   This record has been created using Conservation officer, historic buildings. Errors have been sought and corrected,but may not always be located. Such creation errors do not reflect on the standard of care.   Amaryllis Dare, MD [facility] Pager [phone]  If 7PM-7AM, please contact night-coverage [email or web address] 11/29/2023, 3:43 PM

## 2023-11-29 NOTE — Plan of Care (Signed)

## 2023-11-29 NOTE — TOC Progression Note (Addendum)
 Transition of Care [facility]) - Progression Note    Patient Details  Name: Traci Carter MRN: [id number] Date of Birth: 01/05/1946  [facility] CM/SW Contact  Dalia GORMAN Fuse, RN Phone Number: 11/29/2023, 3:36 PM  Clinical Narrative:     Patient received bed offer from [facility]. TOC spoke with the patient's daughter [name], who accepted the bed offer. TOC made Ricky at [facility] aware. Nitchia to start ins auth.    Barriers to Discharge: Continued Medical Work up               Expected Discharge Plan and Services       Living arrangements for the past 2 months: Single Family Home Expected Discharge Date: 11/29/23                                     Social Drivers of Health (SDOH) Interventions SDOH Screenings   Food Insecurity: No Food Insecurity (11/20/2023)  Housing: Low Risk  (11/20/2023)  Transportation Needs: No Transportation Needs (11/20/2023)  Utilities: Not At Risk (11/20/2023)  Financial Resource Strain: Low Risk  (09/25/2020)   Received from [facility]  Social Connections: Moderately Isolated (11/20/2023)  Tobacco Use: Low Risk  (11/20/2023)    Readmission Risk Interventions    10/17/2023    3:58 PM  Readmission Risk Prevention Plan  PCP or Specialist Appt within 3-5 Days Complete  Social Work Consult for Recovery Care Planning/Counseling Complete  Palliative Care Screening Not Applicable

## 2023-11-29 NOTE — Plan of Care (Signed)
  Problem: Clinical Measurements: Goal: Ability to maintain clinical measurements within normal limits will improve Outcome: Progressing Goal: Will remain free from infection Outcome: Progressing Goal: Diagnostic test results will improve Outcome: Progressing Goal: Respiratory complications will improve Outcome: Progressing Goal: Cardiovascular complication will be avoided Outcome: Progressing   Problem: Nutrition: Goal: Adequate nutrition will be maintained Outcome: Progressing   Problem: Coping: Goal: Level of anxiety will decrease Outcome: Progressing   Problem: Elimination: Goal: Will not experience complications related to bowel motility Outcome: Progressing Goal: Will not experience complications related to urinary retention Outcome: Progressing   Problem: Pain Managment: Goal: General experience of comfort will improve and/or be controlled Outcome: Progressing   Problem: Safety: Goal: Ability to remain free from injury will improve Outcome: Progressing   Problem: Skin Integrity: Goal: Risk for impaired skin integrity will decrease Outcome: Progressing   Problem: Clinical Measurements: Goal: Ability to maintain a body temperature in the normal range will improve Outcome: Progressing   Problem: Respiratory: Goal: Ability to maintain adequate ventilation will improve Outcome: Progressing Goal: Ability to maintain a clear airway will improve Outcome: Progressing   Problem: Education: Goal: Knowledge of General Education information will improve Description: Including pain rating scale, medication(s)/side effects and non-pharmacologic comfort measures Outcome: Not Progressing   Problem: Health Behavior/Discharge Planning: Goal: Ability to manage health-related needs will improve Outcome: Not Progressing   Problem: Activity: Goal: Risk for activity intolerance will decrease Outcome: Not Progressing   Problem: Activity: Goal: Ability to tolerate increased  activity will improve Outcome: Not Progressing

## 2023-11-30 DIAGNOSIS — A419 Sepsis, unspecified organism: Secondary | ICD-10-CM | POA: Diagnosis not present

## 2023-11-30 DIAGNOSIS — I10 Essential (primary) hypertension: Secondary | ICD-10-CM | POA: Diagnosis not present

## 2023-11-30 DIAGNOSIS — E119 Type 2 diabetes mellitus without complications: Secondary | ICD-10-CM | POA: Diagnosis not present

## 2023-11-30 DIAGNOSIS — E785 Hyperlipidemia, unspecified: Secondary | ICD-10-CM | POA: Diagnosis not present

## 2023-11-30 MED ORDER — ENSURE PLUS HIGH PROTEIN PO LIQD
237.0000 mL | Freq: Two times a day (BID) | ORAL | Status: DC
  Administered 2023-11-30 – 2023-12-01 (×4): 237 mL via ORAL

## 2023-11-30 NOTE — [place] FL2 (Signed)
 [place]  MEDICAID FL2 LEVEL OF CARE FORM     IDENTIFICATION  Patient Name: Traci Carter Birthdate: 11/20/1945 Sex: female Admission Date (Current Location): 11/20/2023  [facility] and [place] Number:  Chiropodist and Address:  [facility], [address] [place], [place] [address]      Provider Number: [id number]  Attending Physician Name and Address:  Caleen Qualia, MD  Relative Name and Phone Number:  Dylynn Ketner    Current Level of Care: Hospital Recommended Level of Care: Skilled Nursing Facility Prior Approval Number:    Date Approved/Denied: 11/24/23 PASRR Number: [id number]  Discharge Plan: SNF    Current Diagnoses: Patient Active Problem List   Diagnosis Date Noted   Sepsis (HCC) 11/20/2023   Acute renal failure with tubular necrosis (HCC) 10/17/2023   Hypovolemic shock (HCC) 10/17/2023   Severe sepsis with septic shock (HCC) 10/17/2023   Metabolic acidosis 10/17/2023   Altered mental status 10/16/2023   Ambien  accidental overdose, initial encounter 02/09/2021   Hypokalemia 02/09/2021   Hypocalcemia 02/09/2021   Acquired thrombophilia (HCC) 01/16/2021   UTI (urinary tract infection) 01/14/2021   Acute metabolic encephalopathy 01/14/2021   Gout flare 01/14/2021   Hyperglycemia due to type 2 diabetes mellitus (HCC) 01/14/2021   Atrial fibrillation, chronic (HCC) 01/14/2021   HTN (hypertension) 01/14/2021   History of CVA (cerebrovascular accident) 01/14/2021   Fracture of femoral condyle, right, closed (HCC) 10/17/2020   Generalized weakness 09/16/2020   Type II diabetes mellitus with renal manifestations (HCC) 09/16/2020   CKD (chronic kidney disease), stage IIIa 09/16/2020   Depression with anxiety 09/16/2020   Fall 09/16/2020   UTI (urinary tract infection) 09/16/2020   Seizure (HCC) 09/16/2020   Diabetes mellitus type 2, uncomplicated (HCC) 09/10/2019   Chest pain with low risk for cardiac etiology  12/27/2018   SOB (shortness of breath) on exertion 12/27/2018   Paroxysmal atrial fibrillation (HCC) 10/16/2018   Hyperlipidemia associated with type 2 diabetes mellitus (HCC) 10/16/2018   Pacemaker 10/16/2018   History of CVA (cerebrovascular accident) 10/16/2018   OSA (obstructive sleep apnea) 10/16/2018   Fibromyalgia 10/16/2018   CAD (coronary artery disease) 10/16/2018   Diarrhea 06/09/2017   Hyperlipemia 08/15/2013   ICH (intracerebral hemorrhage) (HCC) 07/21/2012   Diabetes (HCC) 07/21/2012   Essential hypertension 07/21/2012   CVA (cerebral vascular accident) (HCC) 07/20/2012    Orientation RESPIRATION BLADDER Height & Weight     Self    Incontinent Weight: 99.7 kg Height:  5' 3 (160 cm)  BEHAVIORAL SYMPTOMS/MOOD NEUROLOGICAL BOWEL NUTRITION STATUS      Incontinent    AMBULATORY STATUS COMMUNICATION OF NEEDS Skin     Verbally                         Personal Care Assistance Level of Assistance  Bathing, Feeding, Dressing Bathing Assistance: Limited assistance Feeding assistance: Limited assistance Dressing Assistance: Limited assistance     Functional Limitations Info  Sight Sight Info: Impaired        SPECIAL CARE FACTORS FREQUENCY  PT (By licensed PT), OT (By licensed OT)     PT Frequency: 5 x per week OT Frequency: 5 x per week            Contractures      Additional Factors Info  Code Status Code Status Info: Full Code Allergies Info: Codeine No severity specified - Other (See Comments) Comments Codeine No severity or reactions specified Digoxin No severity  or reactions specified Comments Lactose Intolerance (gi) No severity or reactions specified Lanoxin (digoxin) No severity or reactions specified Penicillins No severity or reactions specified Zoloft Received from outside source No severity or reactions specified Zoloft (sertraline Hcl) No severity or reactions specified Penicillins Received from outside source Low - Other (See Comments),  Rash           Current Medications (11/30/2023):  This is the current hospital active medication list Current Facility-Administered Medications  Medication Dose Route Frequency Provider Last Rate Last Admin   acetaminophen  (TYLENOL ) tablet 650 mg  650 mg Oral Q6H PRN Paudel, Keshab, MD   650 mg at 11/21/23 1004   Or   acetaminophen  (TYLENOL ) suppository 650 mg  650 mg Rectal Q6H PRN Roann Gouty, MD       albuterol  (PROVENTIL ) (2.5 MG/3ML) 0.083% nebulizer solution 3 mL  3 mL Inhalation Q4H PRN Paudel, Gouty, MD       apixaban  (ELIQUIS ) tablet 5 mg  5 mg Oral BID Paudel, Keshab, MD   5 mg at 11/30/23 9170   atorvastatin  (LIPITOR) tablet 20 mg  20 mg Oral Daily Paudel, Gouty, MD   20 mg at 11/30/23 0829   levETIRAcetam  (KEPPRA ) undiluted injection 1,000 mg  1,000 mg Intravenous Q12H Trudy Anthony HERO, MD   1,000 mg at 11/30/23 0431   lisinopril  (ZESTRIL ) tablet 20 mg  20 mg Oral Daily Amin, Sumayya, MD   20 mg at 11/30/23 9170   ondansetron  (ZOFRAN ) tablet 4 mg  4 mg Oral Q6H PRN Paudel, Keshab, MD       Or   ondansetron  (ZOFRAN ) injection 4 mg  4 mg Intravenous Q6H PRN Paudel, Keshab, MD       polyethylene glycol (MIRALAX  / GLYCOLAX ) packet 17 g  17 g Oral Daily PRN Paudel, Gouty, MD       QUEtiapine  (SEROQUEL ) tablet 25-50 mg  25-50 mg Oral QHS Lenon Marien CROME, MD   50 mg at 11/29/23 2033     Discharge Medications: Please see discharge summary for a list of discharge medications.  Relevant Imaging Results:  Relevant Lab Results:   Additional Information SSN: [id number]  Dalia GORMAN Fuse, RN

## 2023-11-30 NOTE — Plan of Care (Signed)

## 2023-11-30 NOTE — Progress Notes (Signed)
   11/30/23 1145  Spiritual Encounters  Type of Visit Initial  Care provided to: Patient  Conversation partners present during encounter Nurse  Referral source Nurse (RN/NT/LPN)  Reason for visit Routine spiritual support  OnCall Visit No   Chaplain responded to a [place] Consult entered in the EPIC system.  Staff shared patient was tearful and may benefit from spiritual care.  When Chaplain went, patient declined care.  Chaplain said she'd ask overnight Chaplain to see if patient would enjoy a visit later this evening.    Rev. Rana M. Nicholaus, M.Div. Chaplain Resident [facility]

## 2023-11-30 NOTE — Progress Notes (Signed)
 PROGRESS NOTE  Daliah Chaudoin  FMW:[id number] DOB: 18-Jul-1945 DOA: 11/20/2023 PCP: Patient, No Pcp Per   HPI was taken from Dr. Roann: Ginny Loomer is a pleasant 78 y.o. female with medical history significant for A-fib on Eliquis , DM, dual-chamber pacemaker, ICH, GERD, seizure, anxiety/depression, fibromyalgia who was brought into ED from a nursing facility for evaluation of fevers.  Patient was recently admitted and discharged from hospital on 10/16/2023, required pressors secondary to urinary tract infection.  Patient has dementia and not able to provide any meaningful history. Per EMS patient has been altered for the last couple of days and they had to put her on oxygen.  She normally does not take oxygen at baseline.  According to family member they thought she had CHF and they started her on Lasix .  According to family she had not been normal for couple of days.  Unclear why it took so long for the patient to be transported here.  Today was the first day she had a fever.   ED Course: Upon arrival to the ED, patient is found to have fever at 102.2, tachycardic around 112 and tachypneic around 20 saturating 89%.  Checks x-ray did not show any focal pneumonia rather it shows mild congestive changes.  She was given cefepime , doxycycline , Flagyl  and vancomycin  in the emergency room.  Cultures were sent.  Hospitalist service was consulted for evaluation for admission for possible pneumonia.  8/27: Hemodynamically stable, completed the course of antibiotic for pneumonia and UTI.  Awaiting placement.  8/28: Remained hemodynamically stable.  Awaiting placement.  Assessment & Plan:   Principal Problem:   Sepsis (HCC) Active Problems:   Essential hypertension   Paroxysmal atrial fibrillation (HCC)   Hyperlipidemia associated with type 2 diabetes mellitus (HCC)   OSA (obstructive sleep apnea)   Diabetes mellitus type 2, uncomplicated (HCC)   Hyperlipemia  Assessment and Plan: Sepsis:  see Dr. Lamount notes on how pt met sepsis criteria. Likely secondary to pneumonia & UTI. Sepsis resolved   Multifocal pneumonia: positive for metapneumovirus. Possible superimposed bacteria infection. Continue on IV rocephin , bronchodilators, and encourage incentive spirometry. Completed Abx  UTI: urine cx growing klebsiella. Completed ABx  Acute encephalopathy: etiology unclear, possible secondary to above infection. Recently lost her husband of 54 years approx 2 weeks, concern for depression. Improved mental status today but still depressed & not wanting to take an anti-depressant at this time. Repeat CT head was neg for acute intracranial abnormalities. Pacemaker is NOT MRI compatible so MRI was d/c.  Mentation now close to baseline.  Hypernatremia: resolved   Hypokalemia.  Resolved  Hypomagnesemia: WNL today   Thrombocytosis: etiology unclear.  Likely reactive -Continue to monitor   Chronic HFpEF.  Clinically appears euvolemic.  Holding home lasix   DM2: well controlled, HbA1c 5.7. Continue on SSI w/ accuchecks    Seizure disorder: continue on IV keppra  and can switch back to po keppra  prior to d/c or when pt is agreeable to take po meds   HTN: Blood pressure elevated - Increasing the dose of lisinopril   HLD: continue on statin   Likely PAF: continue on eliquis    DVT prophylaxis: eliquis  Code Status: full  Family Communication: Called daughter with no response Disposition Plan:  SNF.   Status is: Inpatient Remains inpatient appropriate because: needs SNF placement, pt wanted to think it over but pt's daughter wants the pt to go to SNF   Level of care: Telemetry Medical Consultants:    Procedures:  Antimicrobials: rocephin   Subjective: Patient was complaining of some headache, some nursing concern of intermittent confusion.  Objective: Vitals:   11/29/23 0804 11/29/23 1629 11/29/23 2021 11/30/23 0904  BP: (!) 150/88 (!) 149/98 (!) 168/94 (!) 147/100   Pulse: 92 99 96 96  Resp: 16 16 14 17   Temp: 97.6 F (36.4 C) 98.3 F (36.8 C) 97.9 F (36.6 C) 97.8 F (36.6 C)  TempSrc:  Oral    SpO2: 93% 92% 93% 90%  Weight:      Height:        Intake/Output Summary (Last 24 hours) at 11/30/2023 1427 Last data filed at 11/29/2023 1630 Gross per 24 hour  Intake --  Output 300 ml  Net -300 ml   Filed Weights   11/20/23 0753 11/20/23 1752  Weight: 81.6 kg 99.7 kg    Examination:  General.  Frail elderly lady, in no acute distress. Pulmonary.  Lungs clear bilaterally, normal respiratory effort. CV.  Regular rate and rhythm, no JVD, rub or murmur. Abdomen.  Soft, nontender, nondistended, BS positive. CNS.  Alert and oriented .  No focal neurologic deficit. Extremities.  No edema, no cyanosis, pulses intact and symmetrical.  Data Reviewed: I have personally reviewed following labs and imaging studies  CBC: Recent Labs  Lab 11/25/23 0510 11/26/23 0436 11/27/23 0546 11/28/23 0536 11/29/23 0551  WBC 7.1 9.8 12.8* 11.8* 10.9*  HGB 10.8* 10.5* 11.2* 10.7* 10.7*  HCT 32.8* 31.7* 33.7* 32.5* 32.6*  MCV 92.1 92.4 91.6 93.1 93.1  PLT 347 417* 523* 501* 500*   Basic Metabolic Panel: Recent Labs  Lab 11/25/23 0510 11/26/23 0436 11/27/23 0546 11/28/23 0536 11/29/23 0551  NA 147* 147* 144 144 145  K 3.2* 3.0* 3.4* 3.1* 3.8  CL 103 101 102 104 104  CO2 29 28 29 28 26   GLUCOSE 204* 282* 281* 270* 262*  BUN 38* 34* 29* 31* 29*  CREATININE 1.27* 1.10* 1.03* 0.99 1.02*  CALCIUM  9.0 8.9 9.3 9.5 9.8  MG  --  1.2*  --  2.2 1.9   GFR: Estimated Creatinine Clearance: 52.8 mL/min (A) (by C-G formula based on SCr of 1.02 mg/dL (H)). Liver Function Tests: No results for input(s): AST, ALT, ALKPHOS, BILITOT, PROT, ALBUMIN  in the last 168 hours.  No results for input(s): LIPASE, AMYLASE in the last 168 hours. Recent Labs  Lab 11/27/23 0546  AMMONIA 15   Coagulation Profile: No results for input(s): INR, PROTIME  in the last 168 hours.  Cardiac Enzymes: No results for input(s): CKTOTAL, CKMB, CKMBINDEX, TROPONINI in the last 168 hours. BNP (last 3 results) No results for input(s): PROBNP in the last 8760 hours. HbA1C: No results for input(s): HGBA1C in the last 72 hours. CBG: No results for input(s): GLUCAP in the last 168 hours.  Lipid Profile: No results for input(s): CHOL, HDL, LDLCALC, TRIG, CHOLHDL, LDLDIRECT in the last 72 hours. Thyroid Function Tests: No results for input(s): TSH, T4TOTAL, FREET4, T3FREE, THYROIDAB in the last 72 hours. Anemia Panel: No results for input(s): VITAMINB12, FOLATE, FERRITIN, TIBC, IRON, RETICCTPCT in the last 72 hours. Sepsis Labs: No results for input(s): PROCALCITON, LATICACIDVEN in the last 168 hours.   Recent Results (from the past 240 hours)  Respiratory (~20 pathogens) panel by PCR     Status: Abnormal   Collection Time: 11/21/23  8:47 AM   Specimen: Nasopharyngeal Swab; Respiratory  Result Value Ref Range Status   Adenovirus NOT DETECTED NOT DETECTED Final   Coronavirus 229E NOT DETECTED NOT DETECTED Final  Comment: (NOTE) The Coronavirus on the Respiratory Panel, DOES NOT test for the novel  Coronavirus (2019 nCoV)    Coronavirus HKU1 NOT DETECTED NOT DETECTED Final   Coronavirus NL63 NOT DETECTED NOT DETECTED Final   Coronavirus OC43 NOT DETECTED NOT DETECTED Final   Metapneumovirus DETECTED (A) NOT DETECTED Final   Rhinovirus / Enterovirus NOT DETECTED NOT DETECTED Final   Influenza A NOT DETECTED NOT DETECTED Final   Influenza B NOT DETECTED NOT DETECTED Final   Parainfluenza Virus 1 NOT DETECTED NOT DETECTED Final   Parainfluenza Virus 2 NOT DETECTED NOT DETECTED Final   Parainfluenza Virus 3 NOT DETECTED NOT DETECTED Final   Parainfluenza Virus 4 NOT DETECTED NOT DETECTED Final   Respiratory Syncytial Virus NOT DETECTED NOT DETECTED Final   Bordetella pertussis NOT DETECTED NOT  DETECTED Final   Bordetella Parapertussis NOT DETECTED NOT DETECTED Final   Chlamydophila pneumoniae NOT DETECTED NOT DETECTED Final   Mycoplasma pneumoniae NOT DETECTED NOT DETECTED Final    Comment: Performed at [facility], [address], [place], [place] [address]     Radiology Studies: No results found.  Scheduled Meds:  apixaban   5 mg Oral BID   atorvastatin   20 mg Oral Daily   feeding supplement  237 mL Oral BID BM   levETIRAcetam   1,000 mg Intravenous Q12H   lisinopril   20 mg Oral Daily   QUEtiapine   25-50 mg Oral QHS   Continuous Infusions:   LOS: 10 days   This record has been created using Conservation officer, historic buildings. Errors have been sought and corrected,but may not always be located. Such creation errors do not reflect on the standard of care.   Amaryllis Dare, MD [facility] Pager [phone]  If 7PM-7AM, please contact night-coverage [email or web address] 11/30/2023, 2:27 PM

## 2023-11-30 NOTE — TOC Progression Note (Signed)
 Transition of Care [facility]) - Progression Note    Patient Details  Name: Traci Carter MRN: [id number] Date of Birth: 13-Oct-1945  [facility] CM/SW Contact  Dalia GORMAN Fuse, RN Phone Number: 11/30/2023, 3:24 PM  Clinical Narrative:    WONDA received shara  for [facility] Approved PlanAuthID:[id number] dates: 8/28-12/05/2023 Next Review DAte: 12/05/2023. The facility can accept the patient tomorrow.      Barriers to Discharge: Continued Medical Work up               Expected Discharge Plan and Services       Living arrangements for the past 2 months: Single Family Home Expected Discharge Date: 11/29/23                                     Social Drivers of Health (SDOH) Interventions SDOH Screenings   Food Insecurity: No Food Insecurity (11/20/2023)  Housing: Low Risk  (11/20/2023)  Transportation Needs: No Transportation Needs (11/20/2023)  Utilities: Not At Risk (11/20/2023)  Financial Resource Strain: Low Risk  (09/25/2020)   [facility]  Social Connections: Moderately Isolated (11/20/2023)  Tobacco Use: Low Risk  (11/20/2023)    Readmission Risk Interventions    10/17/2023    3:58 PM  Readmission Risk Prevention Plan  PCP or Specialist Appt within 3-5 Days Complete  Social Work Consult for Recovery Care Planning/Counseling Complete  Palliative Care Screening Not Applicable

## 2023-11-30 NOTE — Progress Notes (Signed)
 Physical Therapy Treatment Patient Details Name: Traci Carter MRN: [id number] DOB: 17-Jul-1945 Today's Date: 11/30/2023   History of Present Illness Patient is a 78 year old female with sepsis likely due to pneumonia/UTI. PMH: atrial fibrillation, pacemaker for complete heart block, DM, CAD, HTN, ICH, CVA, seizures, fibromyalgia, anxiety/depression, OSA    PT Comments  Pt demonstrates improvement with bed mobility requiring mod physical assistance to get to sitting at EOB. 1 attempt made to stand with RW and elevated bed height, however pt unable to obtain full standing position despite max A from SPT. Pt expressed fear of falling and declined further attempts to stand. Pt is very self limiting, however responds well to verbal encouragement. Would benefit from skilled PT to promote optimal return to PLOF.    If plan is discharge home, recommend the following: Two people to help with walking and/or transfers;A lot of help with bathing/dressing/bathroom;Assistance with cooking/housework;Assist for transportation;Help with stairs or ramp for entrance;Direct supervision/assist for medications management   Can travel by private vehicle     No  Equipment Recommendations  Hoyer lift;Wheelchair (measurements PT);BSC/3in1;Hospital bed    Recommendations for Other Services       Precautions / Restrictions Precautions Precautions: Fall Recall of Precautions/Restrictions: Impaired Restrictions Weight Bearing Restrictions Per Provider Order: No     Mobility  Bed Mobility Overal bed mobility: Needs Assistance Bed Mobility: Supine to Sit Rolling: Mod assist, Used rails   Supine to sit: Mod assist, HOB elevated, Used rails Sit to supine: Min assist   General bed mobility comments: Pt able to initiate bed mobitliy to sit at EOB. Max multimodal cuing for hand placement and sequencing. Mod A  once close to the EOB using chuck pad to scoot  forward for feet to reach the gound.     Transfers Overall transfer level: Needs assistance Equipment used: Rolling walker (2 wheels) Transfers: Sit to/from Stand Sit to Stand: From elevated surface, Max assist           General transfer comment: Pt unable to obtain full stand. 1 attempt made. Pt cites that she is very fearful of trying to stand and refused any more attempts.    Ambulation/Gait               General Gait Details: NT. Not safe at this time   Stairs             Wheelchair Mobility     Tilt Bed    Modified Rankin (Stroke Patients Only)       Balance Overall balance assessment: Needs assistance Sitting-balance support: Feet supported, Bilateral upper extremity supported Sitting balance-Leahy Scale: Fair Sitting balance - Comments: Able to maintain sitting balance at EOB. Initial posterior lean, however pt was able to correct with verbal and tactile cue for hand on bed. Postural control: Posterior lean Standing balance support: Bilateral upper extremity supported, Reliant on assistive device for balance   Standing balance comment: 1 attempt made to stand at EOB with elevated bed height, however pt unable to obtain full stand despite Max A from SPT. Pt is very fearful of falls and declines further attempts.                            Communication Communication Communication: Impaired Factors Affecting Communication: Difficulty expressing self  Cognition Arousal: Alert Behavior During Therapy: Flat affect   PT - Cognitive impairments: No family/caregiver present to determine baseline  PT - Cognition Comments: Pt is agreeable to PT. Pt is very fearful of tasks being asked. Requires a lot of encouragement. Following commands: Impaired Following commands impaired: Follows one step commands inconsistently, Follows one step commands with increased time    Cueing Cueing Techniques: Verbal cues, Tactile cues  Exercises      General  Comments General comments (skin integrity, edema, etc.): wheezy breathing at times. SpO2 monitored and WNL thorughout session      Pertinent Vitals/Pain Pain Assessment Pain Assessment: No/denies pain    Home Living                          Prior Function            PT Goals (current goals can now be found in the care plan section) Acute Rehab PT Goals Patient Stated Goal: to get in the chair PT Goal Formulation: With patient Time For Goal Achievement: 12/05/23 Potential to Achieve Goals: Fair Progress towards PT goals: Progressing toward goals    Frequency    Min 1X/week      PT Plan      Co-evaluation              AM-PAC PT 6 Clicks Mobility   Outcome Measure  Help needed turning from your back to your side while in a flat bed without using bedrails?: A Lot Help needed moving from lying on your back to sitting on the side of a flat bed without using bedrails?: A Lot Help needed moving to and from a bed to a chair (including a wheelchair)?: Total Help needed standing up from a chair using your arms (e.g., wheelchair or bedside chair)?: Total Help needed to walk in hospital room?: Total Help needed climbing 3-5 steps with a railing? : Total 6 Click Score: 8    End of Session   Activity Tolerance: Patient tolerated treatment well Patient left: in bed;with call bell/phone within reach;with bed alarm set Nurse Communication: Mobility status PT Visit Diagnosis: Muscle weakness (generalized) (M62.81);Unsteadiness on feet (R26.81)     Time: 9052-8979 PT Time Calculation (min) (ACUTE ONLY): 33 min  Charges:                            Zyden Suman, SPT    Adriane Guglielmo 11/30/2023, 12:12 PM

## 2023-11-30 NOTE — Progress Notes (Signed)
 Wound to coccyx and anterior pelvic Healed. Foam changed to sacra region.

## 2023-12-01 DIAGNOSIS — E119 Type 2 diabetes mellitus without complications: Secondary | ICD-10-CM | POA: Diagnosis not present

## 2023-12-01 DIAGNOSIS — E785 Hyperlipidemia, unspecified: Secondary | ICD-10-CM | POA: Diagnosis not present

## 2023-12-01 DIAGNOSIS — I10 Essential (primary) hypertension: Secondary | ICD-10-CM | POA: Diagnosis not present

## 2023-12-01 DIAGNOSIS — A419 Sepsis, unspecified organism: Secondary | ICD-10-CM | POA: Diagnosis not present

## 2023-12-01 LAB — GLUCOSE, CAPILLARY
Glucose-Capillary: 256 mg/dL — ABNORMAL HIGH (ref 70–99)
Glucose-Capillary: 302 mg/dL — ABNORMAL HIGH (ref 70–99)

## 2023-12-01 MED ORDER — INSULIN ASPART 100 UNIT/ML IJ SOLN
10.0000 [IU] | Freq: Once | INTRAMUSCULAR | Status: AC
  Administered 2023-12-01: 10 [IU] via SUBCUTANEOUS
  Filled 2023-12-01: qty 1

## 2023-12-01 MED ORDER — INSULIN ASPART 100 UNIT/ML IJ SOLN
10.0000 [IU] | Freq: Once | INTRAMUSCULAR | Status: AC
  Administered 2023-12-01: 10 [IU] via SUBCUTANEOUS

## 2023-12-01 MED ORDER — POLYETHYLENE GLYCOL 3350 17 G PO PACK: 17.0000 g | PACK | Freq: Every day | ORAL | Status: DC | PRN

## 2023-12-01 MED ORDER — ENSURE PLUS HIGH PROTEIN PO LIQD: 237.0000 mL | Freq: Two times a day (BID) | ORAL | Status: DC

## 2023-12-01 MED ORDER — LISINOPRIL 20 MG PO TABS: 20.0000 mg | ORAL_TABLET | Freq: Every day | ORAL | Status: DC

## 2023-12-01 MED ORDER — INSULIN ASPART 100 UNIT/ML IJ SOLN: 0.0000 [IU] | Freq: Every day | INTRAMUSCULAR | Status: DC

## 2023-12-01 MED ORDER — GUAIFENESIN ER 600 MG PO TB12: 600.0000 mg | ORAL_TABLET | Freq: Two times a day (BID) | ORAL | Status: DC | PRN

## 2023-12-01 MED ORDER — INSULIN ASPART 100 UNIT/ML IJ SOLN
0.0000 [IU] | Freq: Three times a day (TID) | INTRAMUSCULAR | Status: DC
  Filled 2023-12-01: qty 1

## 2023-12-01 NOTE — Discharge Instructions (Addendum)
 [email or web address]   Home Health services set up with [facility]. Patient will receive [facility] home health services.

## 2023-12-01 NOTE — Progress Notes (Signed)
 Patient transported by EMS, heading home, Daughter aware of this.

## 2023-12-01 NOTE — Discharge Summary (Signed)
 Physician Discharge Summary   Patient: Traci Carter MRN: [id number] DOB: [birth year]  Admit date:     11/20/2023  Discharge date: 12/01/23  Discharge Physician: Amaryllis Dare   PCP: Patient, No Pcp Per   Recommendations at discharge:  Please obtain CBC and BMP on follow-up Follow-up with primary care provider within a week  Discharge Diagnoses: Principal Problem:   Sepsis (HCC) Active Problems:   Essential hypertension   Paroxysmal atrial fibrillation (HCC)   Hyperlipidemia associated with type 2 diabetes mellitus (HCC)   OSA (obstructive sleep apnea)   Diabetes mellitus type 2, uncomplicated (HCC)   Hyperlipemia   Hospital Course: Traci Carter is a pleasant 78 y.o. female with medical history significant for A-fib on Eliquis , DM, dual-chamber pacemaker, ICH, GERD, seizure, anxiety/depression, fibromyalgia who was brought into ED from a nursing facility for evaluation of fevers.  Patient was recently admitted and discharged from hospital on 10/16/2023, required pressors secondary to urinary tract infection.  Patient has dementia and not able to provide any meaningful history. Per EMS patient has been altered for the last couple of days and they had to put her on oxygen.  She normally does not take oxygen at baseline.  According to family member they thought she had CHF and they started her on Lasix .  According to family she had not been normal for couple of days.  Unclear why it took so long for the patient to be transported here.  Today was the first day she had a fever.   ED Course: Upon arrival to the ED, patient is found to have fever at 102.2, tachycardic around 112 and tachypneic around 20 saturating 89%.  Checks x-ray did not show any focal pneumonia rather it shows mild congestive changes.  She was given cefepime , doxycycline , Flagyl  and vancomycin  in the emergency room.  Cultures were sent.  Hospitalist service was consulted for evaluation for admission for possible  pneumonia.  Patient met sepsis criteria secondary to pneumonia and UTI.  She was also found to have positive for metapneumovirus.  She completed the antibiotics for pneumonia and UTI.  Urine cultures grew Klebsiella.  Patient does has some waxing and waning mental status, she is also grieving, recently lost her husband of 54 years.  She was feeling depressed but refused to take any antidepressants.  CT head x 2 was negative for any intracranial abnormalities.  Patient had pacemaker in place which is not MRI compatible.  Mentation seems close to baseline.  She will get benefit from outpatient grieving therapy and psych evaluation.  She will get benefit from antidepressants if she agrees to take them.  Patient had multiple electrolyte derangements during course of hospitalization which include hyponatremia, hypokalemia, hypomagnesemia which were corrected.  Her most recent A1c done in July was 5.7.  She will continue with home meds.  Patient also has an history of seizure disorder for which she will continue with Keppra .  Her blood pressure seems elevated so we slowly increased her home dose of lisinopril , currently on 20 mg daily.  Patient also has an history of paroxysmal atrial fibrillation for which she will continue Eliquis .  Due to her physical debility PT recommending going to rehab before returning home.  Patient is being discharged to SNF for rehab.  She will continue on current medications and need to have a close follow-up with her providers for further assistance.      Consultants: None Procedures performed: None Disposition: Skilled nursing facility Diet recommendation:  Cardiac and Carb  modified diet DISCHARGE MEDICATION: Allergies as of 12/01/2023       Reactions   Codeine Other (See Comments)   GI Distress GI Distress GI Distress   Codeine    Digoxin    Other reaction(s): Unknown   Lactose Intolerance (gi)    Lanoxin [digoxin]    Penicillins    Zoloft  [sertraline Hcl]    Penicillins Rash, Other (See Comments)        Medication List     TAKE these medications    acetaminophen  325 MG tablet Commonly known as: TYLENOL  Take 2 tablets (650 mg total) by mouth every 6 (six) hours as needed for mild pain, headache, fever or moderate pain.   albuterol  108 (90 Base) MCG/ACT inhaler Commonly known as: VENTOLIN  HFA Inhale 2 puffs into the lungs every 6 (six) hours as needed for wheezing or shortness of breath.   albuterol  (2.5 MG/3ML) 0.083% nebulizer solution Commonly known as: PROVENTIL  Inhale 3 mLs into the lungs every 4 (four) hours as needed for wheezing or shortness of breath.   allopurinol  100 MG tablet Commonly known as: ZYLOPRIM  Take 100 mg by mouth daily.   ALPRAZolam  0.25 MG dissolvable tablet Commonly known as: NIRAVAM  Take 0.25 mg by mouth at bedtime as needed for anxiety.   atorvastatin  20 MG tablet Commonly known as: LIPITOR Take 20 mg by mouth daily.   cetirizine 10 MG tablet Commonly known as: ZYRTEC Take 10 mg by mouth daily.   colchicine  0.6 MG tablet Take 1 tablet (0.6 mg total) by mouth daily. What changed:  when to take this additional instructions   Eliquis  5 MG Tabs tablet Generic drug: apixaban  Take 5 mg by mouth 2 (two) times daily.   famotidine  20 MG tablet Commonly known as: PEPCID  Take 20 mg by mouth daily.   feeding supplement Liqd Take 237 mLs by mouth 2 (two) times daily between meals.   furosemide  20 MG tablet Commonly known as: LASIX  Take 1 tablet (20 mg total) by mouth daily as needed (weight gain >3 lbs in 1 day and >5 lbs in 2 days). What changed: when to take this   glipiZIDE  2.5 MG 24 hr tablet Commonly known as: GLUCOTROL  XL Take 2.5 mg by mouth daily.   guaiFENesin  600 MG 12 hr tablet Commonly known as: MUCINEX  Take 1 tablet (600 mg total) by mouth 2 (two) times daily as needed. What changed:  when to take this reasons to take this   hydrOXYzine  25 MG  tablet Commonly known as: ATARAX  Take 25 mg by mouth every 8 (eight) hours as needed for anxiety.   ipratropium-albuterol  0.5-2.5 (3) MG/3ML Soln Commonly known as: DUONEB Take 3 mLs by nebulization every 6 (six) hours as needed.   levETIRAcetam  1000 MG tablet Commonly known as: KEPPRA  Take 1,000 mg by mouth 2 (two) times daily.   lisinopril  20 MG tablet Commonly known as: ZESTRIL  Take 1 tablet (20 mg total) by mouth daily. What changed:  medication strength how much to take   magnesium  oxide 400 MG tablet Commonly known as: MAG-OX Take 400 mg by mouth daily.   metFORMIN  1000 MG tablet Commonly known as: GLUCOPHAGE  Take 1,000 mg by mouth 2 (two) times daily.   naloxone 4 MG/0.1ML Liqd nasal spray kit Commonly known as: NARCAN Place 1 spray into the nose 3 (three) times daily as needed.   nystatin powder Apply 1 Application topically 2 (two) times daily.   polyethylene glycol 17 g packet Commonly known as: MIRALAX  /  GLYCOLAX  Take 17 g by mouth daily as needed for mild constipation.   pregabalin  150 MG capsule Commonly known as: LYRICA  Take 150 mg by mouth 2 (two) times daily.   QUEtiapine  25 MG tablet Commonly known as: SEROQUEL  Take 25-50 mg by mouth at bedtime.        Contact information for after-discharge care     Destination     [facility] .   Service: Skilled Nursing Contact information: [address] [place]  [address] [phone]                    Discharge Exam: Traci Carter   11/20/23 0753 11/20/23 1752  Weight: 81.6 kg 99.7 kg   General.  Frail elderly lady, in no acute distress. Pulmonary.  Lungs clear bilaterally, normal respiratory effort. CV.  Regular rate and rhythm, no JVD, rub or murmur. Abdomen.  Soft, nontender, nondistended, BS positive. CNS.  Alert and oriented .  No focal neurologic deficit. Extremities.  No edema, no cyanosis, pulses intact and symmetrical.  Condition at  discharge: stable  The results of significant diagnostics from this hospitalization (including imaging, microbiology, ancillary and laboratory) are listed below for reference.   Imaging Studies: CT HEAD WO CONTRAST ( ) Result Date: 11/25/2023 CLINICAL DATA:  78 year old female with altered mental status. EXAM: CT HEAD WITHOUT CONTRAST TECHNIQUE: Contiguous axial images were obtained from the base of the skull through the vertex without intravenous contrast. RADIATION DOSE REDUCTION: This exam was performed according to the departmental dose-optimization program which includes automated exposure control, adjustment of the mA and/or kV according to patient size and/or use of iterative reconstruction technique. COMPARISON:  Head CT 11/20/2023. FINDINGS: Brain: Stable cerebral volume. No midline shift, ventriculomegaly, mass effect, evidence of mass lesion, intracranial hemorrhage or evidence of cortically based acute infarction. Patchy and confluent bilateral cerebral white matter hypodensity with deep white matter capsule involvement appears stable. Small chronic right cerebellar infarct, stable. Stable gray-white differentiation. Vascular: No suspicious intracranial vascular hyperdensity. Calcified atherosclerosis at the skull base. Skull: Stable and intact. Sinuses/Orbits: Ongoing ethmoid sinus mucosal thickening and opacification. Tympanic cavities and mastoids remain clear. Other: Calcified scalp vessel atherosclerosis. No acute orbit or scalp soft tissue finding. IMPRESSION: 1. No acute intracranial abnormality. Stable non contrast CT appearance of advanced chronic ischemic disease. 2. Ongoing ethmoid sinus inflammation. Electronically Signed   By: VEAR Hurst M.D.   On: 11/25/2023 06:32   CT Renal Stone Study Result Date: 11/20/2023 CLINICAL DATA:  78 year old female with fever, altered mental status, possible sepsis. Abdomen and flank pain. EXAM: CT ABDOMEN AND PELVIS WITHOUT CONTRAST TECHNIQUE:  Multidetector CT imaging of the abdomen and pelvis was performed following the standard protocol without IV contrast. RADIATION DOSE REDUCTION: This exam was performed according to the departmental dose-optimization program which includes automated exposure control, adjustment of the mA and/or kV according to patient size and/or use of iterative reconstruction technique. COMPARISON:  Portable chest x-ray today. Noncontrast CT Abdomen and Pelvis 10/16/2023. FINDINGS: Lower chest: Cardiac pacemaker lead. Stable mild cardiomegaly. No pericardial effusion. Respiratory motion artifact. Similar lung volumes and new from last month confluent peribronchial and airspace opacity in the lower lobes greater on the left. Left lower lobe air bronchograms. No pleural effusion. Hepatobiliary: Chronic cholecystectomy.  Negative noncontrast liver. Pancreas: Partial atrophy. Spleen: Negative; punctate splenic calcified granuloma or vascular calcifications. Adrenals/Urinary Tract: Normal adrenal glands. Stable kidneys, appear nonobstructed with chronic nonspecific pararenal fat stranding or edema. No change from  last month. Punctate nephrolithiasis more apparent on the left, and superimposed renal vascular calcifications. Ureters remain diminutive. Chronic pelvic phleboliths. Negative urinary bladder. Stomach/Bowel: Moderate large bowel redundancy and retained stool. No large bowel inflammation identified, probable previous appendectomy. Nondilated terminal ileum and small bowel. Nondilated stomach and duodenum. No pneumoperitoneum or free fluid. Vascular/Lymphatic: Chronic severe calcified atherosclerosis. The blood pool hypodensity. Normal caliber abdominal aorta. Vascular patency is not evaluated in the absence of IV contrast. No lymphadenopathy. Reproductive: Chronically diminutive or absent. Other: No pelvis free fluid. Musculoskeletal: Advanced spine degeneration. Thoracic spine hyperostosis with some interbody ankylosis.  Widespread lumbar vacuum disc is chronic. No acute osseous abnormality identified. IMPRESSION: 1. Left greater than right lower lobe Pneumonia, consider Aspiration. No pleural effusion. 2. No superimposed acute or inflammatory process identified in the noncontrast abdomen or pelvis; Chronic nephrolithiasis.  Chronic perinephric edema, nonspecific. Blood pool hypodensity, query Anemia. Advanced chronic calcified atherosclerosis, Aortic Atherosclerosis (ICD10-I70.0). Electronically Signed   By: VEAR Hurst M.D.   On: 11/20/2023 09:26   CT Cervical Spine Wo Contrast Result Date: 11/20/2023 CLINICAL DATA:  78 year old female with fever, altered mental status, possible sepsis. Abdomen and flank pain. EXAM: CT CERVICAL SPINE WITHOUT CONTRAST TECHNIQUE: Multidetector CT imaging of the cervical spine was performed without intravenous contrast. Multiplanar CT image reconstructions were also generated. RADIATION DOSE REDUCTION: This exam was performed according to the departmental dose-optimization program which includes automated exposure control, adjustment of the mA and/or kV according to patient size and/or use of iterative reconstruction technique. COMPARISON:  Head CT today.  Portable chest x-ray today 0800 hours. FINDINGS: Alignment: Straightening of cervical lordosis. Cervicothoracic junction alignment is within normal limits. Bilateral posterior element alignment is within normal limits. Skull base and vertebrae: Visualized skull base is intact. No atlanto-occipital dissociation. C1 and C2 appear intact and aligned. No acute osseous abnormality identified. Soft tissues and spinal canal: No prevertebral fluid or swelling. No visible canal hematoma. Bulky calcified cervical carotid atherosclerosis. Disc levels: Bulky degenerative disc osteophyte complex at C4-C5. Probable mild associated spinal stenosis. Upper chest: Left subclavian approach pacemaker type leads. Calcified aortic atherosclerosis. Patchy, confluent  opacity partially visible in the right upper lobe series 2, image 85. IMPRESSION: 1. No acute osseous abnormality identified in the cervical spine. 2. Bulky degenerative disc osteophyte complex at C4-C5 with probable mild spinal stenosis. 3. Patchy, confluent opacity partially visible in the Right upper lobe, nonspecific and might be edema or infection. 4. Calcified carotid and Aortic Atherosclerosis (ICD10-I70.0). Electronically Signed   By: VEAR Hurst M.D.   On: 11/20/2023 09:20   CT HEAD WO CONTRAST ( ) Result Date: 11/20/2023 CLINICAL DATA:  78 year old female with fever, altered mental status, possible sepsis. Abdomen and flank pain. EXAM: CT HEAD WITHOUT CONTRAST TECHNIQUE: Contiguous axial images were obtained from the base of the skull through the vertex without intravenous contrast. RADIATION DOSE REDUCTION: This exam was performed according to the departmental dose-optimization program which includes automated exposure control, adjustment of the mA and/or kV according to patient size and/or use of iterative reconstruction technique. COMPARISON:  Head CT 10/16/2023. FINDINGS: Brain: Cerebral volume is stable, normal for age. No midline shift, ventriculomegaly, mass effect, evidence of mass lesion, intracranial hemorrhage or evidence of cortically based acute infarction. Small chronic right superior cerebellar infarct and patchy, confluent asymmetric bilateral white matter hypodensity is stable. Small area of chronic cortical encephalomalacia junction of the posterior left frontal, parietal and temporal lobes is stable. Vascular: Advanced Calcified atherosclerosis at the skull base. No suspicious intracranial  vascular hyperdensity. Skull: Stable and intact. Sinuses/Orbits: Visualized paranasal sinuses and mastoids are stable and well aerated. Other: Calcified scalp vessel atherosclerosis. No acute orbit or scalp soft tissue finding. IMPRESSION: 1. No acute intracranial abnormality. Stable non contrast CT  appearance of chronic ischemic disease. 2. Advanced calcified atherosclerosis, such as can be seen with advanced renal disease. Electronically Signed   By: VEAR Hurst M.D.   On: 11/20/2023 09:17   DG Chest Port 1 View Result Date: 11/20/2023 CLINICAL DATA:  Questionable sepsis. EXAM: PORTABLE CHEST 1 VIEW COMPARISON:  10/17/2023 and CT chest 05/08/2006. FINDINGS: Trachea is midline. Heart size stable. Pacemaker lead tips are in the right atrium and right ventricle. Thoracic aorta is calcified. Lungs are low in volume with interstitial prominence and indistinctness. Possible tiny left pleural effusion. IMPRESSION: Favor mild congestive heart failure. Electronically Signed   By: Newell Eke M.D.   On: 11/20/2023 08:09    Microbiology: Results for orders placed or performed during the hospital encounter of 11/20/23  Blood Culture (routine x 2)     Status: None   Collection Time: 11/20/23  8:20 AM   Specimen: BLOOD  Result Value Ref Range Status   Specimen Description BLOOD LEFT ANTECUBITAL  Final   Special Requests   Final    BOTTLES DRAWN AEROBIC AND ANAEROBIC Blood Culture adequate volume   Culture   Final    NO GROWTH 5 DAYS Performed at [facility], [address] [place], [place] [address]    Report Status 11/25/2023 FINAL  Final  Resp panel by RT-PCR (RSV, Flu A&B, Covid) Anterior Nasal Swab     Status: None   Collection Time: 11/20/23  8:21 AM   Specimen: Anterior Nasal Swab  Result Value Ref Range Status   SARS Coronavirus 2 by RT PCR NEGATIVE NEGATIVE Final    Comment: (NOTE) SARS-CoV-2 target nucleic acids are NOT DETECTED.  The SARS-CoV-2 RNA is generally detectable in upper respiratory specimens during the acute phase of infection. The lowest concentration of SARS-CoV-2 viral copies this assay can detect is 138 copies/mL. A negative result does not preclude SARS-Cov-2 infection and should not be used as the sole basis for treatment or other patient management  decisions. A negative result may occur with  improper specimen collection/handling, submission of specimen other than nasopharyngeal swab, presence of viral mutation(s) within the areas targeted by this assay, and inadequate number of viral copies(<138 copies/mL). A negative result must be combined with clinical observations, patient history, and epidemiological information. The expected result is Negative.  Fact Sheet for Patients:  BloggerCourse.com  Fact Sheet for Healthcare Providers:  SeriousBroker.it  This test is no t yet approved or cleared by the [place]  FDA and  has been authorized for detection and/or diagnosis of SARS-CoV-2 by FDA under an Emergency Use Authorization (EUA). This EUA will remain  in effect (meaning this test can be used) for the duration of the COVID-19 declaration under Section 564(b)(1) of the Act, 21 U.S.C.section 360bbb-3(b)(1), unless the authorization is terminated  or revoked sooner.       Influenza A by PCR NEGATIVE NEGATIVE Final   Influenza B by PCR NEGATIVE NEGATIVE Final    Comment: (NOTE) The Xpert Xpress SARS-CoV-2/FLU/RSV plus assay is intended as an aid in the diagnosis of influenza from Nasopharyngeal swab specimens and should not be used as a sole basis for treatment. Nasal washings and aspirates are unacceptable for Xpert Xpress SARS-CoV-2/FLU/RSV testing.  Fact Sheet for Patients: BloggerCourse.com  Fact  Sheet for Healthcare Providers: SeriousBroker.it  This test is not yet approved or cleared by the [place]  FDA and has been authorized for detection and/or diagnosis of SARS-CoV-2 by FDA under an Emergency Use Authorization (EUA). This EUA will remain in effect (meaning this test can be used) for the duration of the COVID-19 declaration under Section 564(b)(1) of the Act, 21 U.S.C. section 360bbb-3(b)(1), unless the  authorization is terminated or revoked.     Resp Syncytial Virus by PCR NEGATIVE NEGATIVE Final    Comment: (NOTE) Fact Sheet for Patients: BloggerCourse.com  Fact Sheet for Healthcare Providers: SeriousBroker.it  This test is not yet approved or cleared by the [place]  FDA and has been authorized for detection and/or diagnosis of SARS-CoV-2 by FDA under an Emergency Use Authorization (EUA). This EUA will remain in effect (meaning this test can be used) for the duration of the COVID-19 declaration under Section 564(b)(1) of the Act, 21 U.S.C. section 360bbb-3(b)(1), unless the authorization is terminated or revoked.  Performed at [facility], [address], [place], [place] [address]   Urine Culture     Status: Abnormal   Collection Time: 11/20/23  8:21 AM   Specimen: Urine, Random  Result Value Ref Range Status   Specimen Description   Final    URINE, RANDOM Performed at [facility], [address] [place], [place] [address]    Special Requests   Final    NONE Reflexed from [id number] Performed at [facility], [address] [place], [place] [address]    Culture >=100,000 COLONIES/mL KLEBSIELLA PNEUMONIAE (A)  Final   Report Status 11/22/2023 FINAL  Final   Organism ID, Bacteria KLEBSIELLA PNEUMONIAE (A)  Final      Susceptibility   Klebsiella pneumoniae - MIC*    AMPICILLIN >=32 RESISTANT Resistant     CEFAZOLIN  (URINE) Value in next row Sensitive      4 SENSITIVEThis is a modified FDA-approved test that has been validated and its performance characteristics determined by the reporting laboratory.  This laboratory is certified under the Clinical Laboratory Improvement Amendments CLIA as qualified to perform high complexity clinical laboratory testing.    CEFEPIME  Value in next row Sensitive      4 SENSITIVEThis is a modified FDA-approved test that has been validated and its performance  characteristics determined by the reporting laboratory.  This laboratory is certified under the Clinical Laboratory Improvement Amendments CLIA as qualified to perform high complexity clinical laboratory testing.    ERTAPENEM Value in next row Sensitive      4 SENSITIVEThis is a modified FDA-approved test that has been validated and its performance characteristics determined by the reporting laboratory.  This laboratory is certified under the Clinical Laboratory Improvement Amendments CLIA as qualified to perform high complexity clinical laboratory testing.    CEFTRIAXONE  Value in next row Sensitive      4 SENSITIVEThis is a modified FDA-approved test that has been validated and its performance characteristics determined by the reporting laboratory.  This laboratory is certified under the Clinical Laboratory Improvement Amendments CLIA as qualified to perform high complexity clinical laboratory testing.    CIPROFLOXACIN Value in next row Resistant      4 SENSITIVEThis is a modified FDA-approved test that has been validated and its performance characteristics determined by the reporting laboratory.  This laboratory is certified under the Clinical Laboratory Improvement Amendments CLIA as qualified to perform high complexity clinical laboratory testing.    GENTAMICIN Value in next row Resistant  4 SENSITIVEThis is a modified FDA-approved test that has been validated and its performance characteristics determined by the reporting laboratory.  This laboratory is certified under the Clinical Laboratory Improvement Amendments CLIA as qualified to perform high complexity clinical laboratory testing.    NITROFURANTOIN Value in next row Resistant      4 SENSITIVEThis is a modified FDA-approved test that has been validated and its performance characteristics determined by the reporting laboratory.  This laboratory is certified under the Clinical Laboratory Improvement Amendments CLIA as qualified to perform high  complexity clinical laboratory testing.    TRIMETH/SULFA Value in next row Sensitive      4 SENSITIVEThis is a modified FDA-approved test that has been validated and its performance characteristics determined by the reporting laboratory.  This laboratory is certified under the Clinical Laboratory Improvement Amendments CLIA as qualified to perform high complexity clinical laboratory testing.    AMPICILLIN/SULBACTAM Value in next row Resistant      4 SENSITIVEThis is a modified FDA-approved test that has been validated and its performance characteristics determined by the reporting laboratory.  This laboratory is certified under the Clinical Laboratory Improvement Amendments CLIA as qualified to perform high complexity clinical laboratory testing.    PIP/TAZO Value in next row Sensitive ug/mL     16 SENSITIVEThis is a modified FDA-approved test that has been validated and its performance characteristics determined by the reporting laboratory.  This laboratory is certified under the Clinical Laboratory Improvement Amendments CLIA as qualified to perform high complexity clinical laboratory testing.    MEROPENEM Value in next row Sensitive      16 SENSITIVEThis is a modified FDA-approved test that has been validated and its performance characteristics determined by the reporting laboratory.  This laboratory is certified under the Clinical Laboratory Improvement Amendments CLIA as qualified to perform high complexity clinical laboratory testing.    * >=100,000 COLONIES/mL KLEBSIELLA PNEUMONIAE  Blood Culture (routine x 2)     Status: None   Collection Time: 11/20/23  8:26 AM   Specimen: BLOOD  Result Value Ref Range Status   Specimen Description BLOOD BLOOD RIGHT WRIST  Final   Special Requests   Final    BOTTLES DRAWN AEROBIC AND ANAEROBIC Blood Culture results may not be optimal due to an inadequate volume of blood received in culture bottles   Culture   Final    NO GROWTH 5 DAYS Performed at  [facility], [address], [place], [place] [address]    Report Status 11/25/2023 FINAL  Final  MRSA Next Gen by PCR, Nasal     Status: None   Collection Time: 11/20/23  8:47 AM   Specimen: Nasal Mucosa; Nasal Swab  Result Value Ref Range Status   MRSA by PCR Next Gen NOT DETECTED NOT DETECTED Final    Comment: (NOTE) The GeneXpert MRSA Assay (FDA approved for NASAL specimens only), is one component of a comprehensive MRSA colonization surveillance program. It is not intended to diagnose MRSA infection nor to guide or monitor treatment for MRSA infections. Test performance is not FDA approved in patients less than 50 years old. Performed at [facility], [address] [place], [place] [address]   Respiratory (~20 pathogens) panel by PCR     Status: Abnormal   Collection Time: 11/21/23  8:47 AM   Specimen: Nasopharyngeal Swab; Respiratory  Result Value Ref Range Status   Adenovirus NOT DETECTED NOT DETECTED Final   Coronavirus 229E NOT DETECTED NOT DETECTED Final  Comment: (NOTE) The Coronavirus on the Respiratory Panel, DOES NOT test for the novel  Coronavirus (2019 nCoV)    Coronavirus HKU1 NOT DETECTED NOT DETECTED Final   Coronavirus NL63 NOT DETECTED NOT DETECTED Final   Coronavirus OC43 NOT DETECTED NOT DETECTED Final   Metapneumovirus DETECTED (A) NOT DETECTED Final   Rhinovirus / Enterovirus NOT DETECTED NOT DETECTED Final   Influenza A NOT DETECTED NOT DETECTED Final   Influenza B NOT DETECTED NOT DETECTED Final   Parainfluenza Virus 1 NOT DETECTED NOT DETECTED Final   Parainfluenza Virus 2 NOT DETECTED NOT DETECTED Final   Parainfluenza Virus 3 NOT DETECTED NOT DETECTED Final   Parainfluenza Virus 4 NOT DETECTED NOT DETECTED Final   Respiratory Syncytial Virus NOT DETECTED NOT DETECTED Final   Bordetella pertussis NOT DETECTED NOT DETECTED Final   Bordetella Parapertussis NOT DETECTED NOT DETECTED Final   Chlamydophila pneumoniae NOT  DETECTED NOT DETECTED Final   Mycoplasma pneumoniae NOT DETECTED NOT DETECTED Final    Comment: Performed at [facility], [address] [place], [place] [address]    Labs: CBC: Recent Labs  Lab 11/25/23 0510 11/26/23 0436 11/27/23 0546 11/28/23 0536 11/29/23 0551  WBC 7.1 9.8 12.8* 11.8* 10.9*  HGB 10.8* 10.5* 11.2* 10.7* 10.7*  HCT 32.8* 31.7* 33.7* 32.5* 32.6*  MCV 92.1 92.4 91.6 93.1 93.1  PLT 347 417* 523* 501* 500*   Basic Metabolic Panel: Recent Labs  Lab 11/25/23 0510 11/26/23 0436 11/27/23 0546 11/28/23 0536 11/29/23 0551  NA 147* 147* 144 144 145  K 3.2* 3.0* 3.4* 3.1* 3.8  CL 103 101 102 104 104  CO2 29 28 29 28 26   GLUCOSE 204* 282* 281* 270* 262*  BUN 38* 34* 29* 31* 29*  CREATININE 1.27* 1.10* 1.03* 0.99 1.02*  CALCIUM  9.0 8.9 9.3 9.5 9.8  MG  --  1.2*  --  2.2 1.9   Liver Function Tests: No results for input(s): AST, ALT, ALKPHOS, BILITOT, PROT, ALBUMIN  in the last 168 hours. CBG: Recent Labs  Lab 12/01/23 0931  GLUCAP 256*    Discharge time spent: greater than 30 minutes.  This record has been created using Conservation officer, historic buildings. Errors have been sought and corrected,but may not always be located. Such creation errors do not reflect on the standard of care.   Signed: Amaryllis Dare, MD [facility] 12/01/2023

## 2023-12-01 NOTE — Plan of Care (Signed)
  Problem: Education: Goal: Knowledge of General Education information will improve Description: Including pain rating scale, medication(s)/side effects and non-pharmacologic comfort measures Outcome: Progressing   Problem: Health Behavior/Discharge Planning: Goal: Ability to manage health-related needs will improve Outcome: Progressing   Problem: Clinical Measurements: Goal: Ability to maintain clinical measurements within normal limits will improve Outcome: Progressing   Problem: Activity: Goal: Risk for activity intolerance will decrease Outcome: Progressing   Problem: Nutrition: Goal: Adequate nutrition will be maintained Outcome: Progressing   Problem: Coping: Goal: Level of anxiety will decrease Outcome: Progressing   Problem: Pain Managment: Goal: General experience of comfort will improve and/or be controlled Outcome: Progressing   Problem: Safety: Goal: Ability to remain free from injury will improve Outcome: Progressing   Problem: Skin Integrity: Goal: Risk for impaired skin integrity will decrease Outcome: Progressing   Problem: Education: Goal: Ability to describe self-care measures that may prevent or decrease complications (Diabetes Survival Skills Education) will improve Outcome: Progressing   Problem: Coping: Goal: Ability to adjust to condition or change in health will improve Outcome: Progressing   Problem: Fluid Volume: Goal: Ability to maintain a balanced intake and output will improve Outcome: Progressing   Problem: Health Behavior/Discharge Planning: Goal: Ability to identify and utilize available resources and services will improve Outcome: Progressing   Problem: Metabolic: Goal: Ability to maintain appropriate glucose levels will improve Outcome: Progressing   Problem: Nutritional: Goal: Maintenance of adequate nutrition will improve Outcome: Progressing   Problem: Skin Integrity: Goal: Risk for impaired skin integrity will  decrease Outcome: Progressing   Problem: Tissue Perfusion: Goal: Adequacy of tissue perfusion will improve Outcome: Progressing

## 2023-12-01 NOTE — Progress Notes (Signed)
 Pat I.V was taken out for D/C. Ok to leave out per provider.

## 2023-12-01 NOTE — TOC Transition Note (Addendum)
 Transition of Care [facility]) - Discharge Note   Patient Details  Name: Traci Carter MRN: [id number] Date of Birth: 1946-02-19  Transition of Care [facility] CM/SW Contact:  [place] L Kentravious Lipford, LCSW Phone Number: 12/01/2023, 9:55 AM   Clinical Narrative:     CSW spoke with Daril, [employer], to confirm that patient can discharge today. She will be going to room E-15. Medical team has been notified.   Daughter, Mariselda Badalamenti, was notified. [facility] will be transporting patient. No further TOC needs identified. TOC signing off.   12:11: Secure chat received advising that daughter can not pay co-pay at [employer]. She stated that she needed to make a call to the financial advisor to see if there was money available to pay. Daughter stated that this is a lot on her and her father passed away less than four weeks ago. She advised she is just asking that [facility] wait for her to speak to her financial advisor. CSW explained that there needs to be a plan b because patient is ready for discharge and she can not remain at the hospital. Daughter stated that she can not take her mother home. CSW advised that DSS will be contacted if the daughter does not call back with a plan for her mother.   2:00pm: [facility] rescinded bed offer because daughter advised that she was looking for long-term placement. Ricky, [employer] advised that, that changes the referral. CSW reached out to [facility]. They also declined.   CSW contacted Ms. Peltz, daughter. CSW advised that patient is medically ready for discharge, she has no bed offers. CSW advised of resources for the family. The daughter has $4000 that she was going to use to pay for rehab. CSW provided resources for [employer] and [facility] programs.   DME was discussed. Patient has a wheelchair and a walker at home. Daughter advised that she can benefit from a hoyer lift. Hoyer lift ordered and will be delivered to the home. [facility] orders were placed. Daughter advised that she would take her mother home however she was still interested in placement. She stated that she works and she has an 76 year old.   [facility] was contacted and scheduled for transport home. Daughter is looking into private care options.  CSW searched [facility]. Patient had [facility] recently. [facility] set up with [facility]. Services confirmed with Shaun.   Final next level of care: Skilled Nursing Facility Barriers to Discharge: No Barriers Identified   Patient Goals and CMS Choice   CMS Medicare.gov Compare Post Acute Care list provided to:: Patient Choice offered to / list presented to : Patient      Discharge Placement              Patient chooses bed at:  [facility]) Patient to be transferred to facility by: [facility] Name of family member notified: Brittainy Bucker Patient and family notified of of transfer: 12/01/23  Discharge Plan and Services Additional resources added to the After Visit Summary for                                       Social Drivers of Health (SDOH) Interventions SDOH Screenings   Food Insecurity: No Food Insecurity (11/20/2023)  Housing: Low Risk  (11/20/2023)  Transportation Needs: No Transportation Needs (11/20/2023)  Utilities: Not At Risk (11/20/2023)  Financial Resource Strain: Low Risk  (09/25/2020)   Received from [facility]  Social Connections: Moderately Isolated (11/20/2023)  Tobacco Use: Low Risk  (11/20/2023)     Readmission Risk Interventions    10/17/2023    3:58 PM  Readmission Risk Prevention Plan  PCP or Specialist Appt within 3-5 Days Complete  Social Work Consult for Recovery Care Planning/Counseling Complete  Palliative Care Screening Not Applicable

## 2024-01-13 ENCOUNTER — Emergency Department

## 2024-01-13 ENCOUNTER — Inpatient Hospital Stay
Admission: EM | Admit: 2024-01-13 | Discharge: 2024-01-18 | DRG: 683 | Disposition: A | Discharge status: Hospice | Source: Skilled Nursing Facility | Attending: Internal Medicine | Admitting: Internal Medicine

## 2024-01-13 DIAGNOSIS — Z8616 Personal history of COVID-19: Secondary | ICD-10-CM

## 2024-01-13 DIAGNOSIS — Z950 Presence of cardiac pacemaker: Secondary | ICD-10-CM

## 2024-01-13 DIAGNOSIS — I129 Hypertensive chronic kidney disease with stage 1 through stage 4 chronic kidney disease, or unspecified chronic kidney disease: Secondary | ICD-10-CM | POA: Diagnosis present

## 2024-01-13 DIAGNOSIS — E1169 Type 2 diabetes mellitus with other specified complication: Secondary | ICD-10-CM | POA: Diagnosis present

## 2024-01-13 DIAGNOSIS — Z8679 Personal history of other diseases of the circulatory system: Secondary | ICD-10-CM

## 2024-01-13 DIAGNOSIS — E871 Hypo-osmolality and hyponatremia: Secondary | ICD-10-CM | POA: Diagnosis present

## 2024-01-13 DIAGNOSIS — E739 Lactose intolerance, unspecified: Secondary | ICD-10-CM | POA: Diagnosis present

## 2024-01-13 DIAGNOSIS — G4733 Obstructive sleep apnea (adult) (pediatric): Secondary | ICD-10-CM | POA: Diagnosis present

## 2024-01-13 DIAGNOSIS — Z8701 Personal history of pneumonia (recurrent): Secondary | ICD-10-CM

## 2024-01-13 DIAGNOSIS — Z79899 Other long term (current) drug therapy: Secondary | ICD-10-CM

## 2024-01-13 DIAGNOSIS — E875 Hyperkalemia: Secondary | ICD-10-CM | POA: Diagnosis present

## 2024-01-13 DIAGNOSIS — Z8673 Personal history of transient ischemic attack (TIA), and cerebral infarction without residual deficits: Secondary | ICD-10-CM | POA: Diagnosis not present

## 2024-01-13 DIAGNOSIS — Z87440 Personal history of urinary (tract) infections: Secondary | ICD-10-CM

## 2024-01-13 DIAGNOSIS — M797 Fibromyalgia: Secondary | ICD-10-CM | POA: Diagnosis present

## 2024-01-13 DIAGNOSIS — E119 Type 2 diabetes mellitus without complications: Secondary | ICD-10-CM

## 2024-01-13 DIAGNOSIS — E162 Hypoglycemia, unspecified: Secondary | ICD-10-CM

## 2024-01-13 DIAGNOSIS — N183 Chronic kidney disease, stage 3 unspecified: Secondary | ICD-10-CM | POA: Diagnosis present

## 2024-01-13 DIAGNOSIS — Z6841 Body Mass Index (BMI) 40.0 and over, adult: Secondary | ICD-10-CM

## 2024-01-13 DIAGNOSIS — E1122 Type 2 diabetes mellitus with diabetic chronic kidney disease: Secondary | ICD-10-CM | POA: Diagnosis present

## 2024-01-13 DIAGNOSIS — E860 Dehydration: Secondary | ICD-10-CM | POA: Diagnosis present

## 2024-01-13 DIAGNOSIS — G40909 Epilepsy, unspecified, not intractable, without status epilepticus: Secondary | ICD-10-CM | POA: Diagnosis present

## 2024-01-13 DIAGNOSIS — E66813 Obesity, class 3: Secondary | ICD-10-CM | POA: Diagnosis present

## 2024-01-13 DIAGNOSIS — Z66 Do not resuscitate: Secondary | ICD-10-CM | POA: Diagnosis present

## 2024-01-13 DIAGNOSIS — Z8619 Personal history of other infectious and parasitic diseases: Secondary | ICD-10-CM

## 2024-01-13 DIAGNOSIS — Z888 Allergy status to other drugs, medicaments and biological substances status: Secondary | ICD-10-CM

## 2024-01-13 DIAGNOSIS — R944 Abnormal results of kidney function studies: Secondary | ICD-10-CM | POA: Diagnosis present

## 2024-01-13 DIAGNOSIS — I10 Essential (primary) hypertension: Secondary | ICD-10-CM | POA: Diagnosis present

## 2024-01-13 DIAGNOSIS — N1831 Chronic kidney disease, stage 3a: Secondary | ICD-10-CM | POA: Diagnosis present

## 2024-01-13 DIAGNOSIS — Z90710 Acquired absence of both cervix and uterus: Secondary | ICD-10-CM

## 2024-01-13 DIAGNOSIS — E11649 Type 2 diabetes mellitus with hypoglycemia without coma: Secondary | ICD-10-CM | POA: Diagnosis present

## 2024-01-13 DIAGNOSIS — Z880 Allergy status to penicillin: Secondary | ICD-10-CM

## 2024-01-13 DIAGNOSIS — I2510 Atherosclerotic heart disease of native coronary artery without angina pectoris: Secondary | ICD-10-CM | POA: Diagnosis present

## 2024-01-13 DIAGNOSIS — K219 Gastro-esophageal reflux disease without esophagitis: Secondary | ICD-10-CM | POA: Diagnosis present

## 2024-01-13 DIAGNOSIS — E1165 Type 2 diabetes mellitus with hyperglycemia: Secondary | ICD-10-CM | POA: Diagnosis not present

## 2024-01-13 DIAGNOSIS — Z7984 Long term (current) use of oral hypoglycemic drugs: Secondary | ICD-10-CM

## 2024-01-13 DIAGNOSIS — I442 Atrioventricular block, complete: Secondary | ICD-10-CM | POA: Diagnosis present

## 2024-01-13 DIAGNOSIS — Z515 Encounter for palliative care: Secondary | ICD-10-CM | POA: Diagnosis not present

## 2024-01-13 DIAGNOSIS — E7849 Other hyperlipidemia: Secondary | ICD-10-CM | POA: Diagnosis present

## 2024-01-13 DIAGNOSIS — N179 Acute kidney failure, unspecified: Principal | ICD-10-CM | POA: Diagnosis present

## 2024-01-13 DIAGNOSIS — Z885 Allergy status to narcotic agent status: Secondary | ICD-10-CM

## 2024-01-13 DIAGNOSIS — E8721 Acute metabolic acidosis: Secondary | ICD-10-CM | POA: Diagnosis present

## 2024-01-13 DIAGNOSIS — F32A Depression, unspecified: Secondary | ICD-10-CM | POA: Diagnosis present

## 2024-01-13 DIAGNOSIS — Z7901 Long term (current) use of anticoagulants: Secondary | ICD-10-CM | POA: Diagnosis not present

## 2024-01-13 DIAGNOSIS — F419 Anxiety disorder, unspecified: Secondary | ICD-10-CM | POA: Diagnosis present

## 2024-01-13 DIAGNOSIS — I480 Paroxysmal atrial fibrillation: Secondary | ICD-10-CM | POA: Diagnosis present

## 2024-01-13 LAB — COMPREHENSIVE METABOLIC PANEL WITH GFR
ALT: 13 U/L (ref 0–44)
AST: 24 U/L (ref 15–41)
Albumin: 2.8 g/dL — ABNORMAL LOW (ref 3.5–5.0)
Alkaline Phosphatase: 89 U/L (ref 38–126)
Anion gap: 17 — ABNORMAL HIGH (ref 5–15)
BUN: 65 mg/dL — ABNORMAL HIGH (ref 8–23)
CO2: 14 mmol/L — ABNORMAL LOW (ref 22–32)
Calcium: 8.5 mg/dL — ABNORMAL LOW (ref 8.9–10.3)
Chloride: 96 mmol/L — ABNORMAL LOW (ref 98–111)
Creatinine, Ser: 6.38 mg/dL — ABNORMAL HIGH (ref 0.44–1.00)
GFR, Estimated: 6 mL/min — ABNORMAL LOW (ref >60)
Glucose, Bld: 22 mg/dL — CL (ref 70–99)
Potassium: 6 mmol/L — ABNORMAL HIGH (ref 3.5–5.1)
Sodium: 127 mmol/L — ABNORMAL LOW (ref 135–145)
Total Bilirubin: 0.4 mg/dL (ref 0.0–1.2)
Total Protein: 5.8 g/dL — ABNORMAL LOW (ref 6.5–8.1)

## 2024-01-13 LAB — CBC WITH DIFFERENTIAL/PLATELET
Abs Immature Granulocytes: 0.1 K/uL — ABNORMAL HIGH (ref 0.00–0.07)
Basophils Absolute: 0 K/uL (ref 0.0–0.1)
Basophils Relative: 0 %
Eosinophils Absolute: 0 K/uL (ref 0.0–0.5)
Eosinophils Relative: 0 %
HCT: 31.7 % — ABNORMAL LOW (ref 36.0–46.0)
Hemoglobin: 10.4 g/dL — ABNORMAL LOW (ref 12.0–15.0)
Immature Granulocytes: 1 %
Lymphocytes Relative: 28 %
Lymphs Abs: 2.3 K/uL (ref 0.7–4.0)
MCH: 30.4 pg (ref 26.0–34.0)
MCHC: 32.8 g/dL (ref 30.0–36.0)
MCV: 92.7 fL (ref 80.0–100.0)
Monocytes Absolute: 0.6 K/uL (ref 0.1–1.0)
Monocytes Relative: 7 %
Neutro Abs: 5.2 K/uL (ref 1.7–7.7)
Neutrophils Relative %: 64 %
Platelets: 321 K/uL (ref 150–400)
RBC: 3.42 MIL/uL — ABNORMAL LOW (ref 3.87–5.11)
RDW: 14.1 % (ref 11.5–15.5)
WBC: 8.2 K/uL (ref 4.0–10.5)
nRBC: 0 % (ref 0.0–0.2)

## 2024-01-13 LAB — SODIUM, URINE, RANDOM: Sodium, Ur: 35 mmol/L

## 2024-01-13 LAB — URINALYSIS, COMPLETE (UACMP) WITH MICROSCOPIC
Bilirubin Urine: NEGATIVE
Glucose, UA: NEGATIVE mg/dL
Hgb urine dipstick: NEGATIVE
Ketones, ur: NEGATIVE mg/dL
Nitrite: NEGATIVE
Protein, ur: NEGATIVE mg/dL
Specific Gravity, Urine: 1.014 (ref 1.005–1.030)
pH: 5 (ref 5.0–8.0)

## 2024-01-13 LAB — CBG MONITORING, ED
Glucose-Capillary: 128 mg/dL — ABNORMAL HIGH (ref 70–99)
Glucose-Capillary: 20 mg/dL — CL (ref 70–99)
Glucose-Capillary: 80 mg/dL (ref 70–99)
Glucose-Capillary: 99 mg/dL (ref 70–99)
Glucose-Capillary: 72 mg/dL (ref 70–99)
Glucose-Capillary: 85 mg/dL (ref 70–99)

## 2024-01-13 LAB — PHOSPHORUS: Phosphorus: 4.8 mg/dL — ABNORMAL HIGH (ref 2.5–4.6)

## 2024-01-13 LAB — MAGNESIUM: Magnesium: 1.7 mg/dL (ref 1.7–2.4)

## 2024-01-13 LAB — OSMOLALITY, URINE: Osmolality, Ur: 272 mosm/kg — ABNORMAL LOW (ref 300–900)

## 2024-01-13 LAB — PROTIME-INR
INR: 1.1 (ref 0.8–1.2)
Prothrombin Time: 15 s (ref 11.4–15.2)

## 2024-01-13 MED ORDER — HEPARIN SODIUM (PORCINE) 5000 UNIT/ML IJ SOLN
5000.0000 [IU] | Freq: Three times a day (TID) | INTRAMUSCULAR | Status: DC
Start: 2024-01-13 — End: 2024-01-13
  Administered 2024-01-13: 5000 [IU] via SUBCUTANEOUS
  Filled 2024-01-13: qty 1

## 2024-01-13 MED ORDER — CALCIUM CARBONATE ANTACID 1250 MG/5ML PO SUSP: 500.0000 mg | Freq: Four times a day (QID) | ORAL | Status: DC | PRN

## 2024-01-13 MED ORDER — CAMPHOR-MENTHOL 0.5-0.5 % EX LOTN: 1.0000 | TOPICAL_LOTION | Freq: Three times a day (TID) | CUTANEOUS | Status: DC | PRN

## 2024-01-13 MED ORDER — ACETAMINOPHEN 650 MG RE SUPP: 650.0000 mg | Freq: Four times a day (QID) | RECTAL | Status: DC | PRN

## 2024-01-13 MED ORDER — ALBUTEROL SULFATE (2.5 MG/3ML) 0.083% IN NEBU
10.0000 mg | INHALATION_SOLUTION | Freq: Once | RESPIRATORY_TRACT | Status: AC
  Administered 2024-01-13: 10 mg via RESPIRATORY_TRACT
  Filled 2024-01-13: qty 12

## 2024-01-13 MED ORDER — SODIUM BICARBONATE 8.4 % IV SOLN
50.0000 meq | Freq: Once | INTRAVENOUS | Status: AC
  Administered 2024-01-13: 50 meq via INTRAVENOUS
  Filled 2024-01-13: qty 50

## 2024-01-13 MED ORDER — SODIUM CHLORIDE 0.9 % IV BOLUS
1000.0000 mL | Freq: Once | INTRAVENOUS | Status: AC
  Administered 2024-01-13: 1000 mL via INTRAVENOUS

## 2024-01-13 MED ORDER — FAMOTIDINE 20 MG PO TABS
20.0000 mg | ORAL_TABLET | Freq: Every day | ORAL | Status: DC
  Administered 2024-01-14 – 2024-01-18 (×5): 20 mg via ORAL
  Filled 2024-01-13 (×5): qty 1

## 2024-01-13 MED ORDER — HYDROXYZINE HCL 25 MG PO TABS: 25.0000 mg | ORAL_TABLET | Freq: Three times a day (TID) | ORAL | Status: DC | PRN

## 2024-01-13 MED ORDER — SORBITOL 70 % SOLN: 30.0000 mL | Status: DC | PRN

## 2024-01-13 MED ORDER — DOCUSATE SODIUM 283 MG RE ENEM: 1.0000 | ENEMA | RECTAL | Status: DC | PRN

## 2024-01-13 MED ORDER — ONDANSETRON HCL 4 MG/2ML IJ SOLN: 4.0000 mg | Freq: Four times a day (QID) | INTRAMUSCULAR | Status: DC | PRN

## 2024-01-13 MED ORDER — ACETAMINOPHEN 325 MG PO TABS
650.0000 mg | ORAL_TABLET | Freq: Four times a day (QID) | ORAL | Status: DC | PRN
  Administered 2024-01-16: 650 mg via ORAL
  Filled 2024-01-13: qty 2

## 2024-01-13 MED ORDER — STERILE WATER FOR INJECTION IV SOLN
INTRAVENOUS | Status: DC
  Filled 2024-01-13 (×4): qty 1000
  Filled 2024-01-13: qty 150
  Filled 2024-01-13 (×4): qty 1000
  Filled 2024-01-13 (×3): qty 150
  Filled 2024-01-13 (×2): qty 1000

## 2024-01-13 MED ORDER — NEPRO/CARBSTEADY PO LIQD
237.0000 mL | Freq: Three times a day (TID) | ORAL | Status: DC | PRN
Start: 2024-01-13 — End: 2024-01-18

## 2024-01-13 MED ORDER — DEXTROSE 50 % IV SOLN
1.0000 | Freq: Once | INTRAVENOUS | Status: AC
  Administered 2024-01-13: 50 mL via INTRAVENOUS

## 2024-01-13 MED ORDER — DOCUSATE SODIUM 100 MG PO CAPS: 100.0000 mg | ORAL_CAPSULE | Freq: Every day | ORAL | Status: DC | PRN

## 2024-01-13 MED ORDER — INSULIN ASPART 100 UNIT/ML IJ SOLN: 0.0000 [IU] | Freq: Every day | INTRAMUSCULAR | Status: DC

## 2024-01-13 MED ORDER — ONDANSETRON HCL 4 MG PO TABS: 4.0000 mg | ORAL_TABLET | Freq: Four times a day (QID) | ORAL | Status: DC | PRN

## 2024-01-13 MED ORDER — INSULIN ASPART 100 UNIT/ML IJ SOLN: 0.0000 [IU] | Freq: Three times a day (TID) | INTRAMUSCULAR | Status: DC

## 2024-01-13 MED ORDER — CALCIUM GLUCONATE-NACL 1-0.675 GM/50ML-% IV SOLN
1.0000 g | Freq: Once | INTRAVENOUS | Status: AC
  Administered 2024-01-13: 1000 mg via INTRAVENOUS
  Filled 2024-01-13: qty 50

## 2024-01-13 MED ORDER — SODIUM ZIRCONIUM CYCLOSILICATE 10 G PO PACK
10.0000 g | PACK | Freq: Once | ORAL | Status: AC
  Administered 2024-01-13: 10 g via ORAL
  Filled 2024-01-13: qty 1

## 2024-01-13 MED ORDER — PATIROMER SORBITEX CALCIUM 8.4 G PO PACK
16.8000 g | PACK | Freq: Every day | ORAL | Status: DC
  Administered 2024-01-13 – 2024-01-16 (×4): 16.8 g via ORAL
  Filled 2024-01-13 (×5): qty 2

## 2024-01-13 MED ORDER — APIXABAN 5 MG PO TABS
5.0000 mg | ORAL_TABLET | Freq: Two times a day (BID) | ORAL | Status: DC
  Administered 2024-01-14 – 2024-01-17 (×8): 5 mg via ORAL
  Filled 2024-01-13 (×8): qty 1

## 2024-01-13 MED ORDER — DEXTROSE 10 % IV SOLN
INTRAVENOUS | Status: DC
  Filled 2024-01-13: qty 1000

## 2024-01-13 MED ORDER — ATORVASTATIN CALCIUM 20 MG PO TABS
20.0000 mg | ORAL_TABLET | Freq: Every day | ORAL | Status: DC
  Administered 2024-01-14 – 2024-01-17 (×4): 20 mg via ORAL
  Filled 2024-01-13 (×4): qty 1

## 2024-01-13 NOTE — ED Provider Notes (Signed)
 [facility] Provider Note    Event Date/Time   First MD Initiated Contact with Patient 01/13/24 1733     (approximate)   History   Abnormal Lab  Pt arrives via [facility] from [facility] for abnormal lab. Reportedly pt has baseline GFR in low 40's and had routine bloodwork today showing GFR 15 and elevated K. Pt denies complaints during triage. Alert and oriented to person and place only.    HPI Traci Carter is a 78 y.o. female PMH CAD, paroxysmal A-fib on Eliquis , CKD, CVA, seizures presents for evaluation of worsening kidney function on outpatient labs - Per EMS, patient had a worsening kidney function test so sent to emergency department for eval.  Per accompanying paperwork, is DNR/DNI.  Recent labs from earlier today at 6 AM show a creatinine of 5.59 (was 1.34 on 12/29/2023), potassium 6.5, glucose of 20 sodium of 125, bicarb 16 - Patient is a very limited historian, is awake and alert, does respond to some basic questions.  Collateral gathered from patient's daughter who is bedside.  Notes patient has had worsening mental status over the past 3 weeks.  Was notified today of her abnormal blood test and requested patient be seen in the emergency department for evaluation.  Confirms full CODE STATUS, dialysis would be within goals of care. - Diagnosed with COVID 10 days ago, on Paxlovid patient   Per chart review, last admitted 11/20/2023-12/01/2023 for sepsis secondary pneumonia and UTI.  Also from positive for metapneumovirus.  Urine cultures grew Klebsiella.  Discharged to SNF.     Physical Exam   Triage Vital Signs: BP 114/60 (BP Location: Right Arm)   Pulse 76   Temp 97.6 F (36.4 C) (Oral)   Resp 16   SpO2 100%    Most recent vital signs: Vitals:   01/13/24 1736  BP: 114/60  Pulse: 76  Resp: 16  Temp: 97.6 F (36.4 C)  SpO2: 100%     General: Awake, no distress.  CV:  Good peripheral perfusion. RRR, RP 2+ Resp:  Normal effort.  CTAB Abd:  No distention. Nontender to deep palpation throughout  Bedside ultrasound with bladder volume less than 200 cc.     ED Results / Procedures / Treatments   Labs (all labs ordered are listed, but only abnormal results are displayed) Labs Reviewed  COMPREHENSIVE METABOLIC PANEL WITH GFR - Abnormal; Notable for the following components:      Result Value   Sodium 127 (*)    Potassium 6.0 (*)    Chloride 96 (*)    CO2 14 (*)    Glucose, Bld 22 (*)    BUN 65 (*)    Creatinine, Ser 6.38 (*)    Calcium  8.5 (*)    Total Protein 5.8 (*)    Albumin  2.8 (*)    GFR, Estimated 6 (*)    Anion gap 17 (*)    All other components within normal limits  CBC WITH DIFFERENTIAL/PLATELET - Abnormal; Notable for the following components:   RBC 3.42 (*)    Hemoglobin 10.4 (*)    HCT 31.7 (*)    Abs Immature Granulocytes 0.10 (*)    All other components within normal limits  PHOSPHORUS - Abnormal; Notable for the following components:   Phosphorus 4.8 (*)    All other components within normal limits  PROTIME-INR  MAGNESIUM   URINALYSIS, COMPLETE (UACMP) WITH MICROSCOPIC  OSMOLALITY  OSMOLALITY, URINE  SODIUM, URINE, RANDOM  EKG  See ED course below   RADIOLOGY Pending.    PROCEDURES:  Critical Care performed: Yes, see critical care procedure note(s)  .Critical Care  Performed by: Clarine Ozell LABOR, MD Authorized by: Clarine Ozell LABOR, MD   Critical care provider statement:    Critical care time (minutes):  30   Critical care time was exclusive of:  Separately billable procedures and treating other patients   Critical care was necessary to treat or prevent imminent or life-threatening deterioration of the following conditions:  Metabolic crisis and renal failure   Critical care was time spent personally by me on the following activities:  Development of treatment plan with patient or surrogate, discussions with consultants, evaluation of patient's response to  treatment, examination of patient, ordering and review of laboratory studies, ordering and review of radiographic studies, ordering and performing treatments and interventions, pulse oximetry, re-evaluation of patient's condition and review of old charts   I assumed direction of critical care for this patient from another provider in my specialty: no     Care discussed with: admitting provider      MEDICATIONS ORDERED IN ED: Medications  sodium zirconium cyclosilicate  (LOKELMA ) packet 10 g (has no administration in time range)  albuterol  (PROVENTIL ) (2.5 MG/3ML) 0.083% nebulizer solution 10 mg (has no administration in time range)  calcium  gluconate 1 g/ 50 mL sodium chloride  IVPB (has no administration in time range)  sodium bicarbonate  injection 50 mEq (has no administration in time range)  patiromer ENA) packet 16.8 g (has no administration in time range)  dextrose  50 % solution 50 mL (50 mLs Intravenous Given 01/13/24 1843)  sodium chloride  0.9 % bolus 1,000 mL (1,000 mLs Intravenous New Bag/Given 01/13/24 1843)     IMPRESSION / MDM / ASSESSMENT AND PLAN / ED COURSE  I reviewed the triage vital signs and the nursing notes.                              DDX/MDM/AP: Differential diagnosis includes, but is not limited to, intrinsic renal disease, consider prerenal etiology such as severe dehydration, exam and bedside ultrasound not consistent with obstructive uropathy, consider underlying UTI contributing, no clear reason to suspect medication side effect.  Plan: - Repeat labs - EKG - Anticipate likely admission  Patient's presentation is most consistent with acute presentation with potential threat to life or bodily function.  The patient is on the cardiac monitor to evaluate for evidence of arrhythmia and/or significant heart rate changes.  ED course below.  Labs redemonstrate hypoglycemia to 22, creatinine greater than 6, potassium of 6.  No obvious EKG changes on my read.   Patient is fortunately mentating at baseline per family, will give D50 for profound hypoglycemia.  Nephrology consulted, recommend Veltassa in addition to Lokelma  for hyperkalemia, also treating with IV fluid, sodium bicarb, calcium , albuterol .  Deferring insulin  in the setting of profound hypoglycemia.  Formal renal ultrasound ordered, bedside ultrasound with no urinary retention.  Admitted to hospitalist service.  Clinical Course as of 01/13/24 1854  Sat Jan 13, 2024  1738 Ecg = atrial fibrillation, rate 84, no gross ST elevation or depression, some T wave inversions noted in leads II and III, left axis deviation, right bundle branch block present.  QTc 466.  No clear evidence of ischemia. [MM]  1804 CBC with no leukocytosis, stable anemia [MM]  1839 CMP with redemonstration of renal failure, hyperkalemia to 6.0, hyponatremia to 127  Also redemonstrate severe hypoglycemia.  Patient is actually mentating at baseline per family and does answer basic questions for me.  Will give D50 now. [MM]  [phone] ED secretary paging nephrology [MM]  [phone] D/w Dr. Marcelino of nephrology - Agrees with the Lokelma , also recommends Veltassa in addition -Calcium , sodium bicarb, albuterol  -Renal ultrasound -Admit to medicine [MM]  1851 Hospitalist consult order placed [MM]    Clinical Course User Index [MM] Clarine Ozell LABOR, MD     FINAL CLINICAL IMPRESSION(S) / ED DIAGNOSES   Final diagnoses:  Acute renal failure, unspecified acute renal failure type  Hypoglycemia  Hyponatremia  Hyperkalemia     Rx / DC Orders   ED Discharge Orders     None        Note:  This document was prepared using Dragon voice recognition software and may include unintentional dictation errors.   Clarine Ozell LABOR, MD 01/13/24 [phone]

## 2024-01-13 NOTE — ED Notes (Signed)
 Patient clean, comfortable and sleep. No needs at this time

## 2024-01-13 NOTE — ED Notes (Signed)
 Checked on patient and offered her a warm blanket, she is resting now and has no further needs

## 2024-01-13 NOTE — H&P (Signed)
 History and Physical    Patient: Traci Carter FMW:[id number] DOB: [birth year] DOA: 01/13/2024 DOS: the patient was seen and examined on 01/13/2024 PCP: Patient, No Pcp Per  Patient coming from: Home  Chief Complaint:  Chief Complaint  Patient presents with   Abnormal Lab   HPI: Traci Carter is a 78 y.o. female with medical history significant of type 2 diabetes, essential hypertension, history of CVA, hyperlipidemia, chronic kidney disease stage III, obstructive sleep apnea, status post pacemaker, history of intracranial hemorrhage fibromyalgia, seizure disorder, depression and anxiety who was brought in by EMS from [facility] for abnormal labs.  Patient had routine blood work today that showed worsening renal function with a GFR of only 50.  Also elevated potassium.  She is drowsy at the moment but denied any complaint.  She is only awake alert and oriented x 2.  Patient was evaluated in the ER and found to have significant hyponatremia, potassium of 6.0, metabolic acidosis with bicarb 14, glucose only 22, BUN of 65 and creatinine 6.38.  She has a gap of 17.  Hemoglobin is 10.4, urinalysis essentially negative.  Renal ultrasound showed changes consistent with medical renal disease but no obstruction.  Nephrology consulted and patient is being admitted with acute on chronic kidney failure and other electrolyte abnormalities.  Review of Systems: As mentioned in the history of present illness. All other systems reviewed and are negative. Past Medical History:  Diagnosis Date   Afib [facility])    Atrial fibrillation (HCC)    Diabetes mellitus without complication (HCC)    DM (diabetes mellitus) (HCC)    Dysrhythmia    HTN (hypertension)    Hypertension    Stroke (HCC) 07/21/12   posterior superior left frontal lobe parenchymal hemorrhage    Past Surgical History:  Procedure Laterality Date   ABDOMINAL HYSTERECTOMY     CARPAL TUNNEL RELEASE     COLONOSCOPY  01/02/2009   Dr  Dessa diverticulosis.   COLONOSCOPY WITH PROPOFOL  N/A 06/28/2017   Procedure: COLONOSCOPY WITH PROPOFOL ;  Surgeon: Dessa Reyes ORN, MD;  Location: [facility] ENDOSCOPY;  Service: Endoscopy;  Laterality: N/A;   ELBOW SURGERY     INSERT / REPLACE / REMOVE PACEMAKER     Dr Ammon   JOINT REPLACEMENT  2005   knee   SHOULDER ARTHROSCOPY     Social History:  reports that she has never smoked. She has never used smokeless tobacco. She reports that she does not drink alcohol and does not use drugs.  Allergies  Allergen Reactions   Codeine Other (See Comments)    GI Distress GI Distress GI Distress   Codeine    Digoxin     Other reaction(s): Unknown   Lactose Intolerance (Gi)    Lanoxin [Digoxin]    Penicillins    Zoloft [Sertraline Hcl]    Penicillins Rash and Other (See Comments)    Family History  Problem Relation Age of Onset   Breast cancer Mother    Prostate cancer Father     Prior to Admission medications   Medication Sig Start Date End Date Taking? Authorizing Provider  acetaminophen  (TYLENOL ) 325 MG tablet Take 2 tablets (650 mg total) by mouth every 6 (six) hours as needed for mild pain, headache, fever or moderate pain. 09/21/20   Elgergawy, Brayton RAMAN, MD  albuterol  (PROVENTIL ) (2.5 MG/3ML) 0.083% nebulizer solution Inhale 3 mLs into the lungs every 4 (four) hours as needed for wheezing or shortness of breath. 09/21/20   Elgergawy, Brayton  S, MD  albuterol  (VENTOLIN  HFA) 108 (90 Base) MCG/ACT inhaler Inhale 2 puffs into the lungs every 6 (six) hours as needed for wheezing or shortness of breath. 08/12/20   [provider]  allopurinol  (ZYLOPRIM ) 100 MG tablet Take 100 mg by mouth daily. 11/28/20   [provider]  ALPRAZolam  (NIRAVAM ) 0.25 MG dissolvable tablet Take 0.25 mg by mouth at bedtime as needed for anxiety.    [provider]  atorvastatin  (LIPITOR) 20 MG tablet Take 20 mg by mouth daily.    [provider]  cetirizine (ZYRTEC) 10  MG tablet Take 10 mg by mouth daily.    [provider]  colchicine  0.6 MG tablet Take 1 tablet (0.6 mg total) by mouth daily. Patient taking differently: Take 0.6 mg by mouth 2 (two) times daily. Take while under gout flare 02/11/21   Jens Durand, MD  ELIQUIS  5 MG TABS tablet Take 5 mg by mouth 2 (two) times daily. 01/07/21   [provider]  famotidine  (PEPCID ) 20 MG tablet Take 20 mg by mouth daily.    [provider]  feeding supplement (ENSURE PLUS HIGH PROTEIN) LIQD Take 237 mLs by mouth 2 (two) times daily between meals. 12/01/23   Caleen Qualia, MD  furosemide  (LASIX ) 20 MG tablet Take 1 tablet (20 mg total) by mouth daily as needed (weight gain >3 lbs in 1 day and >5 lbs in 2 days). Patient taking differently: Take 20 mg by mouth daily. 10/20/20   Tobie Yetta HERO, MD  glipiZIDE  (GLUCOTROL  XL) 2.5 MG 24 hr tablet Take 2.5 mg by mouth daily. 01/04/21   [provider]  guaiFENesin  (MUCINEX ) 600 MG 12 hr tablet Take 1 tablet (600 mg total) by mouth 2 (two) times daily as needed. 12/01/23   Caleen Qualia, MD  hydrOXYzine  (ATARAX ) 25 MG tablet Take 25 mg by mouth every 8 (eight) hours as needed for anxiety.    [provider]  ipratropium-albuterol  (DUONEB) 0.5-2.5 (3) MG/3ML SOLN Take 3 mLs by nebulization every 6 (six) hours as needed.    [provider]  levETIRAcetam  (KEPPRA ) 1000 MG tablet Take 1,000 mg by mouth 2 (two) times daily. 01/08/21   [provider]  lisinopril  (ZESTRIL ) 20 MG tablet Take 1 tablet (20 mg total) by mouth daily. 12/01/23   Amin, Sumayya, MD  magnesium  oxide (MAG-OX) 400 MG tablet Take 400 mg by mouth daily.    [provider]  metFORMIN  (GLUCOPHAGE ) 1000 MG tablet Take 1,000 mg by mouth 2 (two) times daily. 11/28/20   [provider]  naloxone [facility]) nasal spray 4 mg/0.1 mL Place 1 spray into the nose 3 (three) times daily as needed.    [provider]  nystatin powder Apply 1  Application topically 2 (two) times daily.    [provider]  polyethylene glycol (MIRALAX  / GLYCOLAX ) 17 g packet Take 17 g by mouth daily as needed for mild constipation. 12/01/23   Caleen Qualia, MD  pregabalin  (LYRICA ) 150 MG capsule Take 150 mg by mouth 2 (two) times daily.    [provider]  QUEtiapine  (SEROQUEL ) 25 MG tablet Take 25-50 mg by mouth at bedtime.    [provider]    Physical Exam: Vitals:   01/13/24 1736 01/13/24 2030 01/13/24 2159  BP: 114/60 121/74 (!) 153/121  Pulse: 76 80 98  Resp: 16 18 20   Temp: 97.6 F (36.4 C)  97.7 F (36.5 C)  TempSrc: Oral  Oral  SpO2: 100%  100% 100%   Constitutional: Drowsy, chronically ill looking, NAD, calm, comfortable Eyes: PERRL, lids and conjunctivae normal ENMT: Mucous membranes are dry. Posterior pharynx clear of any exudate or lesions.Normal dentition.  Neck: normal, supple, no masses, no thyromegaly Respiratory: clear to auscultation bilaterally, no wheezing, no crackles. Normal respiratory effort. No accessory muscle use.  Cardiovascular: Regular rate and rhythm, no murmurs / rubs / gallops. No extremity edema. 2+ pedal pulses. No carotid bruits.  Abdomen: no tenderness, no masses palpated. No hepatosplenomegaly. Bowel sounds positive.  Musculoskeletal: Good range of motion, no joint swelling or tenderness, Skin: no rashes, lesions, ulcers. No induration Neurologic: CN 2-12 grossly intact. Sensation intact, DTR normal. Strength 5/5 in all 4.  Psychiatric: Normal judgment and insight. Alert and oriented x 2. Normal mood  Data Reviewed:  Temperature 97.7, blood pressure 153/121,sodium 127, potassium 6.4, chloride 96, CO2 14, glucose 22.  BUN 65 creatinine 6.38.  Calcium  8.5.  Anion gap of 17 with phosphorus 4.8.  Albumin  is 2.8.  Hemoglobin 10.4.  Urinalysis essentially negative.  Urine osmolality is 272 and sodium of 25 urine renal ultrasound showed changes consistent with medical renal disease no  acute abnormality EKG shows atrial fibrillation with right bundle branch block and rate control.  Assessment and Plan:  #1 AKI on CKD 3: Patient has creatinine jumped from 1.0 to less than 2 months ago to the level of 6.3.  Nephrology has been consulted.  Not sure if patient will will have recovery of may require dialysis but we will hydrate.  Give more sodium bicarbonate .  Continue to monitor.  #2 hyperkalemia: Will give Lokelma .  Potassium is 6.0 now.  Continue to monitor.  #3 hyponatremia: Most likely dehydration.  No reported nausea vomiting or diarrhea.  Serum osmolality is also low.  Will hydrate aggressively with saline and monitor sodium level.  #4 metabolic acidosis: Secondary to AKI.  Continue to monitor  #5 hypoglycemia: Blood glucose is 22.  We will continue to give dextrose  as needed.  Patient is a diabetic we will continue to monitor blood sugar as well.  #6 type 2 diabetes: Presenting with hyperglycemia.  No medications will be given now.  She will have sliding scale insulin  once her blood sugar rebounds.  #7 essential hypertension: Continue blood pressure monitoring.  #8 coronary artery disease: Stable.  Continue to monitor  #9 paroxysmal atrial fibrillation: Rate is controlled.  Will continue with Eliquis .  This may be stopped if patient requires access for dialysis.  #10 obstructive sleep apnea: Will offer CPAP at night.  #11 history of CVA: Patient also has intracerebral hemorrhage.  She is however on Eliquis  now we will continue    Advance Care Planning:   Code Status: Full Code   Consults: Dr. Lazarus, nephrology  Family Communication: No family at bedside  Severity of Illness: The appropriate patient status for this patient is INPATIENT. Inpatient status is judged to be reasonable and necessary in order to provide the required intensity of service to ensure the patient's safety. The patient's presenting symptoms, physical exam findings, and initial radiographic  and laboratory data in the context of their chronic comorbidities is felt to place them at high risk for further clinical deterioration. Furthermore, it is not anticipated that the patient will be medically stable for discharge from the hospital within 2 midnights of admission.   * I certify that at the point of admission it is my clinical judgment that the patient will require inpatient hospital care spanning beyond 2 midnights  from the point of admission due to high intensity of service, high risk for further deterioration and high frequency of surveillance required.*  AuthorBETHA SIM KNOLL, MD 01/13/2024 10:29 PM  For on call review [email or web address].

## 2024-01-13 NOTE — ED Notes (Signed)
 Asked Dr. Clarine for continuous dextrose  gtt. This RN placed pt placed on continuous 36ml/hr D10 gtt to prevent blood glucose dropping back to the 20's. Blood sugar is currently WNL at 72, although it keeps trending downwards, since D50 was given over an hour ago.

## 2024-01-13 NOTE — ED Notes (Signed)
 Called pharmacy and they will bring D10 infusion down

## 2024-01-13 NOTE — ED Triage Notes (Signed)
 Pt arrives via [facility] from [facility] for abnormal lab. Reportedly pt has baseline GFR in low 40's and had routine bloodwork today showing GFR 15 and elevated K. Pt denies complaints during triage. Alert and oriented to person and place only.

## 2024-01-14 DIAGNOSIS — N179 Acute kidney failure, unspecified: Secondary | ICD-10-CM | POA: Diagnosis not present

## 2024-01-14 LAB — CBC
HCT: 26.5 % — ABNORMAL LOW (ref 36.0–46.0)
HCT: 25 % — ABNORMAL LOW (ref 36.0–46.0)
Hemoglobin: 8.9 g/dL — ABNORMAL LOW (ref 12.0–15.0)
Hemoglobin: 8.5 g/dL — ABNORMAL LOW (ref 12.0–15.0)
MCH: 30.7 pg (ref 26.0–34.0)
MCH: 30.7 pg (ref 26.0–34.0)
MCHC: 33.6 g/dL (ref 30.0–36.0)
MCHC: 34 g/dL (ref 30.0–36.0)
MCV: 91.4 fL (ref 80.0–100.0)
MCV: 90.3 fL (ref 80.0–100.0)
Platelets: 254 K/uL (ref 150–400)
Platelets: 237 K/uL (ref 150–400)
RBC: 2.9 MIL/uL — ABNORMAL LOW (ref 3.87–5.11)
RBC: 2.77 MIL/uL — ABNORMAL LOW (ref 3.87–5.11)
RDW: 14.1 % (ref 11.5–15.5)
RDW: 14.2 % (ref 11.5–15.5)
WBC: 8 K/uL (ref 4.0–10.5)
WBC: 7.6 K/uL (ref 4.0–10.5)
nRBC: 0 % (ref 0.0–0.2)
nRBC: 0 % (ref 0.0–0.2)

## 2024-01-14 LAB — COMPREHENSIVE METABOLIC PANEL WITH GFR
ALT: 10 U/L (ref 0–44)
AST: 30 U/L (ref 15–41)
Albumin: 2.2 g/dL — ABNORMAL LOW (ref 3.5–5.0)
Alkaline Phosphatase: 78 U/L (ref 38–126)
Anion gap: 17 — ABNORMAL HIGH (ref 5–15)
BUN: 57 mg/dL — ABNORMAL HIGH (ref 8–23)
CO2: 18 mmol/L — ABNORMAL LOW (ref 22–32)
Calcium: 8.5 mg/dL — ABNORMAL LOW (ref 8.9–10.3)
Chloride: 93 mmol/L — ABNORMAL LOW (ref 98–111)
Creatinine, Ser: 6.22 mg/dL — ABNORMAL HIGH (ref 0.44–1.00)
GFR, Estimated: 7 mL/min — ABNORMAL LOW (ref >60)
Glucose, Bld: 59 mg/dL — ABNORMAL LOW (ref 70–99)
Potassium: 5.4 mmol/L — ABNORMAL HIGH (ref 3.5–5.1)
Sodium: 128 mmol/L — ABNORMAL LOW (ref 135–145)
Total Bilirubin: 0.8 mg/dL (ref 0.0–1.2)
Total Protein: 4.7 g/dL — ABNORMAL LOW (ref 6.5–8.1)

## 2024-01-14 LAB — CBG MONITORING, ED
Glucose-Capillary: 46 mg/dL — ABNORMAL LOW (ref 70–99)
Glucose-Capillary: 103 mg/dL — ABNORMAL HIGH (ref 70–99)
Glucose-Capillary: 78 mg/dL (ref 70–99)

## 2024-01-14 LAB — GLUCOSE, CAPILLARY
Glucose-Capillary: 59 mg/dL — ABNORMAL LOW (ref 70–99)
Glucose-Capillary: 120 mg/dL — ABNORMAL HIGH (ref 70–99)
Glucose-Capillary: 67 mg/dL — ABNORMAL LOW (ref 70–99)
Glucose-Capillary: 100 mg/dL — ABNORMAL HIGH (ref 70–99)

## 2024-01-14 LAB — OSMOLALITY: Osmolality: 287 mosm/kg (ref 275–295)

## 2024-01-14 LAB — PHOSPHORUS: Phosphorus: 4.7 mg/dL — ABNORMAL HIGH (ref 2.5–4.6)

## 2024-01-14 LAB — CREATININE, SERUM
Creatinine, Ser: 6.24 mg/dL — ABNORMAL HIGH (ref 0.44–1.00)
GFR, Estimated: 6 mL/min — ABNORMAL LOW (ref >60)

## 2024-01-14 LAB — CK: Total CK: 31 U/L — ABNORMAL LOW (ref 38–234)

## 2024-01-14 MED ORDER — DEXTROSE 50 % IV SOLN: 25.0000 g | Freq: Once | INTRAVENOUS | Status: DC

## 2024-01-14 MED ORDER — LEVETIRACETAM 500 MG PO TABS
500.0000 mg | ORAL_TABLET | Freq: Two times a day (BID) | ORAL | Status: DC
  Administered 2024-01-14 – 2024-01-18 (×9): 500 mg via ORAL
  Filled 2024-01-14 (×9): qty 1

## 2024-01-14 MED ORDER — SODIUM CHLORIDE 0.9 % IV SOLN: INTRAVENOUS | Status: AC

## 2024-01-14 MED ORDER — DEXTROSE 50 % IV SOLN
INTRAVENOUS | Status: AC
  Administered 2024-01-14: 50 mL via INTRAVENOUS
  Filled 2024-01-14: qty 50

## 2024-01-14 NOTE — ED Notes (Signed)
 This RN secure chatted Dr. Cleatus to see if we could get a new CMP on this pt due to hyperkalemia when pt first arrived to the ED

## 2024-01-14 NOTE — ED Notes (Signed)
 Fall risk bundle is currently in place.

## 2024-01-14 NOTE — Progress Notes (Signed)
 [facility]  ROUNDING NOTE   Subjective:   Patient well-known to us  from the office for management of chronic kidney disease stage IIIA baseline eGFR 59.  Brought in from [facility] for abnormal labs including acute kidney injury and hyperkalemia.  Patient confused at presentation and unable to offer accurate history.  Creatinine 6.38 upon presentation with hypokalemia of serum potassium of 6.0.  Update: Renal function slightly improved with creatinine down to 6.2.  Potassium down to 5.4.  Serum bicarbonate up to 18.  Currently on D10 infusion.  Objective:  Vital signs in last 24 hours:  Temp:  [97.6 F (36.4 C)-98.5 F (36.9 C)] 98.2 F (36.8 C) (10/12 0742) Pulse Rate:  [62-98] 62 (10/12 0700) Resp:  [11-25] 11 (10/12 0730) BP: (100-153)/(54-121) 112/55 (10/12 0730) SpO2:  [100 %] 100 % (10/12 0741)  Weight change:  There were no vitals filed for this visit.  Intake/Output: I/O last 3 completed shifts: In: -  Out: 120 [Urine:120]   Intake/Output this shift:  Total I/O In: 1150 [I.V.:1150] Out: -   Physical Exam: General: Chronically ill-appearing  Head: Normocephalic, atraumatic.  Dry oral mucosa  Neck: Supple  Lungs:  Clear to auscultation, normal effort  Heart: S1S2 no rubs  Abdomen:  Soft, nontender, bowel sounds present  Extremities: No peripheral edema.  Neurologic: Awake, alert, but confused  Skin: No acute rash  Access: No hemodialysis access    Basic Metabolic Panel: Recent Labs  Lab 01/13/24 1745 01/14/24 0205  NA 127* 128*  K 6.0* 5.4*  CL 96* 93*  CO2 14* 18*  GLUCOSE 22* 59*  BUN 65* 57*  CREATININE 6.38* 6.22*  6.24*  CALCIUM  8.5* 8.5*  MG 1.7  --   PHOS 4.8* 4.7*    Liver Function Tests: Recent Labs  Lab 01/13/24 1745 01/14/24 0205  AST 24 30  ALT 13 10  ALKPHOS 89 78  BILITOT 0.4 0.8  PROT 5.8* 4.7*  ALBUMIN  2.8* 2.2*   No results for input(s): LIPASE, AMYLASE in the last 168 hours. No results for  input(s): AMMONIA in the last 168 hours.  CBC: Recent Labs  Lab 01/13/24 1745 01/14/24 0205 01/14/24 0541  WBC 8.2 8.0 7.6  NEUTROABS 5.2  --   --   HGB 10.4* 8.9* 8.5*  HCT 31.7* 26.5* 25.0*  MCV 92.7 91.4 90.3  PLT 321 254 237    Cardiac Enzymes: No results for input(s): CKTOTAL, CKMB, CKMBINDEX, TROPONINI in the last 168 hours.  BNP: Invalid input(s): POCBNP  CBG: Recent Labs  Lab 01/13/24 2015 01/13/24 2031 01/13/24 2203 01/14/24 0702 01/14/24 0757  GLUCAP 80 72 85 46* 103*    Microbiology: Results for orders placed or performed during the hospital encounter of 10/16/23  Urine Culture (for pregnant, neutropenic or urologic patients or patients with an indwelling urinary catheter)     Status: Abnormal   Collection Time: 10/16/23 10:04 PM   Specimen: Urine, Random  Result Value Ref Range Status   Specimen Description   Final    URINE, RANDOM Performed at [facility], [address], [place], [place] [address]    Special Requests   Final    NONE Performed at [facility], [address] [place], [place] [address]    Culture 80,000 COLONIES/mL ESCHERICHIA COLI (A)  Final   Report Status 10/19/2023 FINAL  Final   Organism ID, Bacteria ESCHERICHIA COLI (A)  Final      Susceptibility   Escherichia coli - MIC*  AMPICILLIN >=32 RESISTANT Resistant     CEFAZOLIN  <=4 SENSITIVE Sensitive     CEFEPIME  <=0.12 SENSITIVE Sensitive     CEFTRIAXONE  <=0.25 SENSITIVE Sensitive     CIPROFLOXACIN <=0.25 SENSITIVE Sensitive     GENTAMICIN 8 INTERMEDIATE Intermediate     IMIPENEM <=0.25 SENSITIVE Sensitive     NITROFURANTOIN <=16 SENSITIVE Sensitive     TRIMETH/SULFA >=320 RESISTANT Resistant     AMPICILLIN/SULBACTAM 4 SENSITIVE Sensitive     PIP/TAZO <=4 SENSITIVE Sensitive ug/mL    * 80,000 COLONIES/mL ESCHERICHIA COLI  MRSA Next Gen by PCR, Nasal     Status: None   Collection Time: 10/16/23 11:05 PM   Specimen: Nasal Mucosa; Nasal  Swab  Result Value Ref Range Status   MRSA by PCR Next Gen NOT DETECTED NOT DETECTED Final    Comment: (NOTE) The GeneXpert MRSA Assay (FDA approved for NASAL specimens only), is one component of a comprehensive MRSA colonization surveillance program. It is not intended to diagnose MRSA infection nor to guide or monitor treatment for MRSA infections. Test performance is not FDA approved in patients less than 50 years old. Performed at [facility], [address] [place], [place] [address]   Resp panel by RT-PCR (RSV, Flu A&B, Covid) Anterior Nasal Swab     Status: None   Collection Time: 10/17/23 10:03 AM   Specimen: Anterior Nasal Swab  Result Value Ref Range Status   SARS Coronavirus 2 by RT PCR NEGATIVE NEGATIVE Final    Comment: (NOTE) SARS-CoV-2 target nucleic acids are NOT DETECTED.  The SARS-CoV-2 RNA is generally detectable in upper respiratory specimens during the acute phase of infection. The lowest concentration of SARS-CoV-2 viral copies this assay can detect is 138 copies/mL. A negative result does not preclude SARS-Cov-2 infection and should not be used as the sole basis for treatment or other patient management decisions. A negative result may occur with  improper specimen collection/handling, submission of specimen other than nasopharyngeal swab, presence of viral mutation(s) within the areas targeted by this assay, and inadequate number of viral copies(<138 copies/mL). A negative result must be combined with clinical observations, patient history, and epidemiological information. The expected result is Negative.  Fact Sheet for Patients:  BloggerCourse.com  Fact Sheet for Healthcare Providers:  SeriousBroker.it  This test is no t yet approved or cleared by the [place]  FDA and  has been authorized for detection and/or diagnosis of SARS-CoV-2 by FDA under an Emergency Use Authorization (EUA).  This EUA will remain  in effect (meaning this test can be used) for the duration of the COVID-19 declaration under Section 564(b)(1) of the Act, 21 U.S.C.section 360bbb-3(b)(1), unless the authorization is terminated  or revoked sooner.       Influenza A by PCR NEGATIVE NEGATIVE Final   Influenza B by PCR NEGATIVE NEGATIVE Final    Comment: (NOTE) The Xpert Xpress SARS-CoV-2/FLU/RSV plus assay is intended as an aid in the diagnosis of influenza from Nasopharyngeal swab specimens and should not be used as a sole basis for treatment. Nasal washings and aspirates are unacceptable for Xpert Xpress SARS-CoV-2/FLU/RSV testing.  Fact Sheet for Patients: BloggerCourse.com  Fact Sheet for Healthcare Providers: SeriousBroker.it  This test is not yet approved or cleared by the [place]  FDA and has been authorized for detection and/or diagnosis of SARS-CoV-2 by FDA under an Emergency Use Authorization (EUA). This EUA will remain in effect (meaning this test can be used) for the duration of the COVID-19 declaration under Section 564(b)(1)  of the Act, 21 U.S.C. section 360bbb-3(b)(1), unless the authorization is terminated or revoked.     Resp Syncytial Virus by PCR NEGATIVE NEGATIVE Final    Comment: (NOTE) Fact Sheet for Patients: BloggerCourse.com  Fact Sheet for Healthcare Providers: SeriousBroker.it  This test is not yet approved or cleared by the [place]  FDA and has been authorized for detection and/or diagnosis of SARS-CoV-2 by FDA under an Emergency Use Authorization (EUA). This EUA will remain in effect (meaning this test can be used) for the duration of the COVID-19 declaration under Section 564(b)(1) of the Act, 21 U.S.C. section 360bbb-3(b)(1), unless the authorization is terminated or revoked.  Performed at [facility], [address]  [place], [place] [address]   Respiratory (~20 pathogens) panel by PCR     Status: None   Collection Time: 10/17/23 10:03 AM   Specimen: Nasopharyngeal Swab; Respiratory  Result Value Ref Range Status   Adenovirus NOT DETECTED NOT DETECTED Final   Coronavirus 229E NOT DETECTED NOT DETECTED Final    Comment: (NOTE) The Coronavirus on the Respiratory Panel, DOES NOT test for the novel  Coronavirus (2019 nCoV)    Coronavirus HKU1 NOT DETECTED NOT DETECTED Final   Coronavirus NL63 NOT DETECTED NOT DETECTED Final   Coronavirus OC43 NOT DETECTED NOT DETECTED Final   Metapneumovirus NOT DETECTED NOT DETECTED Final   Rhinovirus / Enterovirus NOT DETECTED NOT DETECTED Final   Influenza A NOT DETECTED NOT DETECTED Final   Influenza B NOT DETECTED NOT DETECTED Final   Parainfluenza Virus 1 NOT DETECTED NOT DETECTED Final   Parainfluenza Virus 2 NOT DETECTED NOT DETECTED Final   Parainfluenza Virus 3 NOT DETECTED NOT DETECTED Final   Parainfluenza Virus 4 NOT DETECTED NOT DETECTED Final   Respiratory Syncytial Virus NOT DETECTED NOT DETECTED Final   Bordetella pertussis NOT DETECTED NOT DETECTED Final   Bordetella Parapertussis NOT DETECTED NOT DETECTED Final   Chlamydophila pneumoniae NOT DETECTED NOT DETECTED Final   Mycoplasma pneumoniae NOT DETECTED NOT DETECTED Final    Comment: Performed at [facility], [address], [place], [place] [address]  Culture, blood (Routine X 2) w Reflex to ID Panel     Status: None   Collection Time: 10/17/23 11:05 AM   Specimen: BLOOD  Result Value Ref Range Status   Specimen Description BLOOD BLOOD RIGHT HAND  Final   Special Requests   Final    BOTTLES DRAWN AEROBIC ONLY Blood Culture results may not be optimal due to an inadequate volume of blood received in culture bottles   Culture   Final    NO GROWTH 5 DAYS Performed at [facility], [address] [place], [place] [address]    Report Status 10/22/2023 FINAL  Final    Coagulation  Studies: Recent Labs    01/13/24 1745  LABPROT 15.0  INR 1.1    Urinalysis: Recent Labs    01/13/24 1854  COLORURINE YELLOW*  LABSPEC 1.014  PHURINE 5.0  GLUCOSEU NEGATIVE  HGBUR NEGATIVE  BILIRUBINUR NEGATIVE  KETONESUR NEGATIVE  PROTEINUR NEGATIVE  NITRITE NEGATIVE  LEUKOCYTESUR SMALL*      Imaging: US  Renal Result Date: 01/13/2024 CLINICAL DATA:  Renal failure EXAM: RENAL / URINARY TRACT ULTRASOUND COMPLETE COMPARISON:  None Available. FINDINGS: Right Kidney: Renal measurements: 9.8 x 5.0 x 5.3 cm. = volume: 137 mL. Diffuse increased echogenicity is noted. No mass or hydronephrosis is seen. Left Kidney: Renal measurements: 9.6 x 4.9 x 5.1 cm. = volume: 121 mL.  Increased echogenicity is noted. No mass or hydronephrosis is seen. Bladder: Decompressed Other: None. IMPRESSION: Changes consistent with medical renal disease. No acute abnormality noted. Electronically Signed   By: Oneil Devonshire M.D.   On: 01/13/2024 19:40     Medications:    sodium chloride      dextrose  50 mL/hr at 01/14/24 0550   sodium bicarbonate  150 mEq in sterile water  1,150 mL infusion Stopped (01/14/24 0759)    apixaban   5 mg Oral BID   atorvastatin   20 mg Oral Daily   dextrose   25 g Intravenous Once   famotidine   20 mg Oral Daily   insulin  aspart  0-15 Units Subcutaneous TID WC   insulin  aspart  0-5 Units Subcutaneous QHS   patiromer  16.8 g Oral Daily   acetaminophen  **OR** acetaminophen , calcium  carbonate (dosed in mg elemental calcium ), camphor-menthol **AND** hydrOXYzine , docusate sodium , feeding supplement (NEPRO CARB STEADY), ondansetron  **OR** ondansetron  (ZOFRAN ) IV, sorbitol  Assessment/ Plan:  78 y.o. female with past medical history of atrial fibrillation, dual-chamber pacemaker for complete heart block, diabetes mellitus type 2, chronic kidney disease stage IIIa, hyperlipidemia, coronary disease, hypertension, intracranial hemorrhage, CVA, GERD, seizure disorder, fibromyalgia,  depression/anxiety, gout, obstructive sleep apnea, history of multiple UTIs who presented with multiple metabolic derangements including acute kidney injury, hyperkalemia, and acute metabolic acidosis.  1.  Acute kidney injury/chronic kidney disease stage IIIa baseline eGFR 59.  Patient with significant acute kidney injury.  Renal ultrasound done and negative for hydronephrosis.  Creatinine slightly improved today.  No immediate need for dialysis but may need to consider this if renal function does not improve significantly.  2.  Hyperkalemia.  Serum potassium down to 5.4.  Continue Veltassa 16.8 g daily for now.  3.  Acute metabolic acidosis.  Serum bicarbonate improved a bit to 18.  Sodium bicarbonate  infusion currently on hold as patient is on D10 at the moment.  Continue to monitor serum bicarbonate level.  4.  Thanks for consultation.   LOS: 1 Makylah Bossard 10/12/20258:46 AM

## 2024-01-14 NOTE — Progress Notes (Signed)
 Progress Note   Patient: Traci Carter FMW:[id number] DOB: [birth year] DOA: 01/13/2024     1 DOS: the patient was seen and examined on 01/14/2024   Brief hospital course: From HPI Traci Carter is a 78 y.o. female with medical history significant of type 2 diabetes, essential hypertension, history of CVA, hyperlipidemia, chronic kidney disease stage III, obstructive sleep apnea, status post pacemaker, history of intracranial hemorrhage fibromyalgia, seizure disorder, depression and anxiety who was brought in by EMS from [facility] for abnormal labs.  Patient had routine blood work today that showed worsening renal function with a GFR of only 50.  Also elevated potassium.  She is drowsy at the moment but denied any complaint.  She is only awake alert and oriented x 2.  Patient was evaluated in the ER and found to have significant hyponatremia, potassium of 6.0, metabolic acidosis with bicarb 14, glucose only 22, BUN of 65 and creatinine 6.38.  She has a gap of 17.  Hemoglobin is 10.4, urinalysis essentially negative.  Renal ultrasound showed changes consistent with medical renal disease but no obstruction.  Nephrology consulted and patient is being admitted with acute on chronic kidney failure and other electrolyte abnormalities.    Assessment and Plan:  AKI on CKD 3: Patient has creatinine jumped from 1.0 to less than 2 months ago to the level of 6.3.   Continue IV fluid Nephrologist consulted Monitor renal function closely  Hyperkalemia status post Lokelma   Monitor potassium closely   Acute hyponatremia most likely dehydration.   Continue current IV fluid Monitor electrolytes closely  #4 metabolic acidosis: Secondary to AKI.   Continue to monitor   #5 hypoglycemia: Blood glucose is 22.   We will continue to give dextrose  as needed.   Patient is a diabetic we will continue to monitor blood sugar as well.   #6 type 2 diabetes: Presenting with hyperglycemia.  No  medications will be given now.   She will have sliding scale insulin  once her blood sugar rebounds.   #7 essential hypertension:  Continue blood pressure monitoring.   #8 coronary artery disease: Stable.   Continue to monitor   #9 paroxysmal atrial fibrillation: Rate is controlled.   Will continue with Eliquis .   This may be stopped if patient requires access for dialysis.   #10 obstructive sleep apnea:  Continue CPAP at night   #11 history of CVA: Continue statins    Advance Care Planning:   Code Status: Full Code    Consults: Dr. Lazarus, nephrology   Family Communication: No family at bedside    Subjective:  Seen and examined at bedside this morning Denies nausea vomiting abdominal pain chest pain cough   Physical Exam: Eyes: PERRL, lids and conjunctivae normal ENMT: Mucous membranes are dry. Posterior pharynx clear of any exudate or lesions.Normal dentition.  Neck: normal, supple, no masses, no thyromegaly Respiratory: clear to auscultation bilaterally, no wheezing, no crackles. Normal respiratory effort. No accessory muscle use.  Cardiovascular: Regular rate and rhythm, no murmurs / rubs / gallops. No extremity edema. 2+ pedal pulses. No carotid bruits.  Abdomen: no tenderness, no masses palpated. No hepatosplenomegaly. Bowel sounds positive.  Musculoskeletal: Good range of motion, no joint swelling or tenderness, Skin: no rashes, lesions, ulcers. No induration Neurologic: CN 2-12 grossly intact. Sensation intact, DTR normal. Strength 5/5 in all 4.  Psychiatric: Normal judgment and insight. Alert and oriented x 2. Normal mood  Data Reviewed:    Latest Ref Rng & Units 01/14/2024  5:41 AM 01/14/2024    2:05 AM 01/13/2024    5:45 PM  CBC  WBC 4.0 - 10.5 K/uL 7.6  8.0  8.2   Hemoglobin 12.0 - 15.0 g/dL 8.5  8.9  89.5   Hematocrit 36.0 - 46.0 % 25.0  26.5  31.7   Platelets 150 - 400 K/uL 237  254  321        Latest Ref Rng & Units 01/14/2024    2:05 AM  01/13/2024    5:45 PM 11/29/2023    5:51 AM  BMP  Glucose 70 - 99 mg/dL 59  22  737   BUN 8 - 23 mg/dL 57  65  29   Creatinine 0.44 - 1.00 mg/dL 9.55 - 8.99 mg/dL 3.75    3.77  3.61  8.97   Sodium 135 - 145 mmol/L 128  127  145   Potassium 3.5 - 5.1 mmol/L 5.4  6.0  3.8   Chloride 98 - 111 mmol/L 93  96  104   CO2 22 - 32 mmol/L 18  14  26    Calcium  8.9 - 10.3 mg/dL 8.5  8.5  9.8      Vitals:   01/14/24 1130 01/14/24 1200 01/14/24 1230 01/14/24 1448  BP: (!) 115/49 115/68 (!) 115/51 (!) 105/40  Pulse: 63 67 (!) 55 65  Resp: 16 11 16 16   Temp: 98.1 F (36.7 C)  98.2 F (36.8 C) 98.3 F (36.8 C)  TempSrc: Oral  Oral Oral  SpO2: 100% 100% 100% 99%    Time spent: 51 minutes  Author: Drue ONEIDA Potter, MD 01/14/2024 3:44 PM  For on call review [email or web address].

## 2024-01-14 NOTE — ED Notes (Signed)
 Pt quiet and resting, no needs at this time

## 2024-01-15 DIAGNOSIS — N179 Acute kidney failure, unspecified: Secondary | ICD-10-CM | POA: Diagnosis not present

## 2024-01-15 LAB — RENAL FUNCTION PANEL
Albumin: 2.2 g/dL — ABNORMAL LOW (ref 3.5–5.0)
Anion gap: 15 (ref 5–15)
BUN: 57 mg/dL — ABNORMAL HIGH (ref 8–23)
CO2: 19 mmol/L — ABNORMAL LOW (ref 22–32)
Calcium: 7.7 mg/dL — ABNORMAL LOW (ref 8.9–10.3)
Chloride: 91 mmol/L — ABNORMAL LOW (ref 98–111)
Creatinine, Ser: 6.12 mg/dL — ABNORMAL HIGH (ref 0.44–1.00)
GFR, Estimated: 7 mL/min — ABNORMAL LOW (ref >60)
Glucose, Bld: 95 mg/dL (ref 70–99)
Phosphorus: 3.9 mg/dL (ref 2.5–4.6)
Potassium: 4.6 mmol/L (ref 3.5–5.1)
Sodium: 125 mmol/L — ABNORMAL LOW (ref 135–145)

## 2024-01-15 LAB — GLUCOSE, CAPILLARY
Glucose-Capillary: 109 mg/dL — ABNORMAL HIGH (ref 70–99)
Glucose-Capillary: 132 mg/dL — ABNORMAL HIGH (ref 70–99)
Glucose-Capillary: 175 mg/dL — ABNORMAL HIGH (ref 70–99)
Glucose-Capillary: 146 mg/dL — ABNORMAL HIGH (ref 70–99)

## 2024-01-15 LAB — MRSA NEXT GEN BY PCR, NASAL: MRSA by PCR Next Gen: DETECTED — AB

## 2024-01-15 LAB — OSMOLALITY: Osmolality: 277 mosm/kg (ref 275–295)

## 2024-01-15 MED ORDER — MUPIROCIN 2 % EX OINT
1.0000 | TOPICAL_OINTMENT | Freq: Two times a day (BID) | CUTANEOUS | Status: DC
  Administered 2024-01-15 – 2024-01-18 (×6): 1 via NASAL
  Filled 2024-01-15: qty 22

## 2024-01-15 MED ORDER — SODIUM CHLORIDE 0.9 % IV BOLUS
250.0000 mL | INTRAVENOUS | Status: AC
  Administered 2024-01-15: 250 mL via INTRAVENOUS

## 2024-01-15 MED ORDER — CHLORHEXIDINE GLUCONATE CLOTH 2 % EX PADS
6.0000 | MEDICATED_PAD | Freq: Every day | CUTANEOUS | Status: DC
  Administered 2024-01-15 – 2024-01-17 (×3): 6 via TOPICAL

## 2024-01-15 MED ORDER — INSULIN ASPART 100 UNIT/ML IJ SOLN
0.0000 [IU] | Freq: Three times a day (TID) | INTRAMUSCULAR | Status: DC
  Administered 2024-01-15: 1 [IU] via SUBCUTANEOUS
  Filled 2024-01-15: qty 1

## 2024-01-15 NOTE — Inpatient Diabetes Management (Signed)
 Inpatient Diabetes Program Recommendations  AACE/ADA: New Consensus Statement on Inpatient Glycemic Control (2015)  Target Ranges:  Prepandial:   less than 140 mg/dL      [facility]ak postprandial:   less than 180 mg/dL (1-2 hours)      Critically ill patients:  140 - 180 mg/dL   Lab Results  Component Value Date   GLUCAP 109 (H) 01/15/2024   HGBA1C 5.7 (H) 10/17/2023    Review of Glycemic Control  Latest Reference Range & Units 01/14/24 11:38 01/14/24 16:20 01/14/24 16:35 01/14/24 16:39 01/14/24 21:31 01/15/24 07:57  Glucose-Capillary 70 - 99 mg/dL 78 59 (L) 67 (L) 879 (H) 100 (H) 109 (H)   Diabetes history: DM 2 Outpatient Diabetes medications:  Glucotrol  2.5 mg daily Metformin  1000 mg bid Current orders for Inpatient glycemic control:  Novolog  0-15 units tid with meals and HS  Inpatient Diabetes Program Recommendations:    Please consider reducing Novolog  correction to very sensitive (0-6 units) tid with meals.   Thanks,  Randall Bullocks, RN, BC-ADM Inpatient Diabetes Coordinator Pager [phone]  (8a-5p)

## 2024-01-15 NOTE — Progress Notes (Signed)
 [facility]  ROUNDING NOTE   Subjective:   Patient well-known to us  from the office for management of chronic kidney disease stage IIIA baseline eGFR 59.  Brought in from [facility] for abnormal labs including acute kidney injury and hyperkalemia.  Patient confused at presentation and unable to offer accurate history.  Creatinine 6.38 upon presentation with hyperkalemia of serum potassium of 6.0.  Update: Patient seen sitting up in bed Denies pain Drowsy today but easily aroused.  Room air  Creatinine 6.12 Sodium 125  Objective:  Vital signs in last 24 hours:  Temp:  [97.4 F (36.3 C)-98.8 F (37.1 C)] 97.4 F (36.3 C) (10/13 0801) Pulse Rate:  [55-72] 60 (10/13 0801) Resp:  [16-17] 16 (10/13 0801) BP: (84-115)/(40-59) 95/52 (10/13 0801) SpO2:  [96 %-100 %] 99 % (10/13 0801) Weight:  [103.6 kg] 103.6 kg (10/13 0500)  Weight change:  Filed Weights   01/15/24 0500  Weight: 103.6 kg    Intake/Output: I/O last 3 completed shifts: In: 1150 [I.V.:1150] Out: 120 [Urine:120]   Intake/Output this shift:  No intake/output data recorded.  Physical Exam: General: Chronically ill-appearing  Head: Normocephalic, atraumatic.  Dry oral mucosa  Lungs:  Clear to auscultation, normal effort  Heart: S1S2 no rubs  Abdomen:  Soft, nontender, bowel sounds present  Extremities: No peripheral edema.  Neurologic: Awake, alert, but confused  Skin: No acute rash  Access: No hemodialysis access    Basic Metabolic Panel: Recent Labs  Lab 01/13/24 1745 01/14/24 0205 01/15/24 0814  NA 127* 128* 125*  K 6.0* 5.4* 4.6  CL 96* 93* 91*  CO2 14* 18* 19*  GLUCOSE 22* 59* 95  BUN 65* 57* 57*  CREATININE 6.38* 6.22*  6.24* 6.12*  CALCIUM  8.5* 8.5* 7.7*  MG 1.7  --   --   PHOS 4.8* 4.7* 3.9    Liver Function Tests: Recent Labs  Lab 01/13/24 1745 01/14/24 0205 01/15/24 0814  AST 24 30  --   ALT 13 10  --   ALKPHOS 89 78  --   BILITOT 0.4 0.8  --   PROT 5.8*  4.7*  --   ALBUMIN  2.8* 2.2* 2.2*   No results for input(s): LIPASE, AMYLASE in the last 168 hours. No results for input(s): AMMONIA in the last 168 hours.  CBC: Recent Labs  Lab 01/13/24 1745 01/14/24 0205 01/14/24 0541  WBC 8.2 8.0 7.6  NEUTROABS 5.2  --   --   HGB 10.4* 8.9* 8.5*  HCT 31.7* 26.5* 25.0*  MCV 92.7 91.4 90.3  PLT 321 254 237    Cardiac Enzymes: Recent Labs  Lab 01/14/24 0205  CKTOTAL 31*    BNP: Invalid input(s): POCBNP  CBG: Recent Labs  Lab 01/14/24 1635 01/14/24 1639 01/14/24 2131 01/15/24 0757 01/15/24 1128  GLUCAP 67* 120* 100* 109* 132*    Microbiology: Results for orders placed or performed during the hospital encounter of 01/13/24  MRSA Next Gen by PCR, Nasal     Status: Abnormal   Collection Time: 01/14/24 10:55 PM   Specimen: Nasal Mucosa; Nasal Swab  Result Value Ref Range Status   MRSA by PCR Next Gen DETECTED (A) NOT DETECTED Final    Comment: RESULT CALLED TO, READ BACK BY AND VERIFIED WITH: TAMARA CONYERS 01/15/24 0100 KLW (NOTE) The GeneXpert MRSA Assay (FDA approved for NASAL specimens only), is one component of a comprehensive MRSA colonization surveillance program. It is not intended to diagnose MRSA infection nor to guide or monitor  treatment for MRSA infections. Test performance is not FDA approved in patients less than 1 years old. Performed at [facility], [address] [place], [place] [address]     Coagulation Studies: Recent Labs    01/13/24 1745  LABPROT 15.0  INR 1.1    Urinalysis: Recent Labs    01/13/24 1854  COLORURINE YELLOW*  LABSPEC 1.014  PHURINE 5.0  GLUCOSEU NEGATIVE  HGBUR NEGATIVE  BILIRUBINUR NEGATIVE  KETONESUR NEGATIVE  PROTEINUR NEGATIVE  NITRITE NEGATIVE  LEUKOCYTESUR SMALL*      Imaging: US  Renal Result Date: 01/13/2024 CLINICAL DATA:  Renal failure EXAM: RENAL / URINARY TRACT ULTRASOUND COMPLETE COMPARISON:  None Available. FINDINGS: Right  Kidney: Renal measurements: 9.8 x 5.0 x 5.3 cm. = volume: 137 mL. Diffuse increased echogenicity is noted. No mass or hydronephrosis is seen. Left Kidney: Renal measurements: 9.6 x 4.9 x 5.1 cm. = volume: 121 mL. Increased echogenicity is noted. No mass or hydronephrosis is seen. Bladder: Decompressed Other: None. IMPRESSION: Changes consistent with medical renal disease. No acute abnormality noted. Electronically Signed   By: Oneil Devonshire M.D.   On: 01/13/2024 19:40     Medications:    dextrose  Stopped (01/15/24 1157)   sodium bicarbonate  150 mEq in sterile water  1,150 mL infusion 125 mL/hr at 01/14/24 1853    apixaban   5 mg Oral BID   atorvastatin   20 mg Oral Daily   Chlorhexidine  Gluconate Cloth  6 each Topical Daily   dextrose   25 g Intravenous Once   famotidine   20 mg Oral Daily   insulin  aspart  0-5 Units Subcutaneous QHS   insulin  aspart  0-6 Units Subcutaneous TID WC   levETIRAcetam   500 mg Oral BID   mupirocin ointment  1 Application Nasal BID   patiromer  16.8 g Oral Daily   acetaminophen  **OR** acetaminophen , calcium  carbonate (dosed in mg elemental calcium ), camphor-menthol **AND** hydrOXYzine , docusate sodium , feeding supplement (NEPRO CARB STEADY), ondansetron  **OR** ondansetron  (ZOFRAN ) IV, sorbitol  Assessment/ Plan:  78 y.o. female with past medical history of atrial fibrillation, dual-chamber pacemaker for complete heart block, diabetes mellitus type 2, chronic kidney disease stage IIIa, hyperlipidemia, coronary disease, hypertension, intracranial hemorrhage, CVA, GERD, seizure disorder, fibromyalgia, depression/anxiety, gout, obstructive sleep apnea, history of multiple UTIs who presented with multiple metabolic derangements including acute kidney injury, hyperkalemia, and acute metabolic acidosis.  1.  Acute kidney injury/chronic kidney disease stage IIIa baseline eGFR 59.  Patient with significant acute kidney injury.  Renal ultrasound done and negative for  hydronephrosis.    Creatinine continues to improve Continue IVF as ordered. No acute indication for dialysis. Will continue to monitor closely  2.  Hyperkalemia.  Serum potassium 4.6.  Continue Veltassa 16.8 g daily today.   3.  Acute metabolic acidosis.  Serum bicarbonate up to 19.   Continue to monitor serum bicarbonate level with IV infusion.    LOS: 2 Hershel Corkery 10/13/202512:10 PM

## 2024-01-15 NOTE — Progress Notes (Signed)
 Progress Note   Patient: Traci Carter FMW:[id number] DOB: 07-18-1945 DOA: 01/13/2024     2 DOS: the patient was seen and examined on 01/15/2024    Brief hospital course: From HPI Traci Carter is a 78 y.o. female with medical history significant of type 2 diabetes, essential hypertension, history of CVA, hyperlipidemia, chronic kidney disease stage III, obstructive sleep apnea, status post pacemaker, history of intracranial hemorrhage fibromyalgia, seizure disorder, depression and anxiety who was brought in by EMS from [facility] for abnormal labs.  Patient had routine blood work today that showed worsening renal function with a GFR of only 50.  Also elevated potassium.  She is drowsy at the moment but denied any complaint.  She is only awake alert and oriented x 2.  Patient was evaluated in the ER and found to have significant hyponatremia, potassium of 6.0, metabolic acidosis with bicarb 14, glucose only 22, BUN of 65 and creatinine 6.38.  She has a gap of 17.  Hemoglobin is 10.4, urinalysis essentially negative.  Renal ultrasound showed changes consistent with medical renal disease but no obstruction.  Nephrology consulted and patient is being admitted with acute on chronic kidney failure and other electrolyte abnormalities.     Assessment and Plan:   AKI on CKD 3: Patient has creatinine jumped from 1.0 to less than 2 months ago to the level of 6.3.   Continue IV fluid Nephrologist consulted Monitor renal function closely   Hyperkalemia status post Lokelma   Monitor potassium closely   Acute hyponatremia most likely dehydration.   Continue current IV fluid Monitor electrolytes closely Follow-up urine sodium and osmolarity level   Acute metabolic acidosis: Secondary to AKI.   Continue to monitor   Acute hypoglycemia: Blood glucose is 22.   Received dextrose  upon presentation IV dextrose  infusion discontinued Continue to monitor glucose level   Type 2 diabetes:  Presenting with hyperglycemia.  No medications will be given now.   She will have sliding scale insulin  once her blood sugar rebounds.   Essential hypertension:  Continue blood pressure monitoring.   Coronary artery disease: Stable.   Continue to monitor   Paroxysmal atrial fibrillation: Rate is controlled.   Will continue with Eliquis .   This may be stopped if patient requires access for dialysis.   Obstructive sleep apnea:  Continue CPAP at night   History of CVA Continue statins    Advance Care Planning:   Code Status: Full Code    Consults: Dr. Lazarus, nephrology   Family Communication: No family at bedside     Subjective:  Seen and examined at bedside this morning Patient denies nausea vomiting abdominal pain chest pain cough Renal function have not improved much     Physical Exam: Eyes: PERRL, lids and conjunctivae normal ENMT: Mucous membranes are dry. Posterior pharynx clear of any exudate or lesions.Normal dentition.  Neck: normal, supple, no masses, no thyromegaly Respiratory: clear to auscultation bilaterally, no wheezing, no crackles. Normal respiratory effort. No accessory muscle use.  Cardiovascular: Regular rate and rhythm, no murmurs / rubs / gallops. No extremity edema. 2+ pedal pulses. No carotid bruits.  Abdomen: no tenderness, no masses palpated. No hepatosplenomegaly. Bowel sounds positive.  Musculoskeletal: Good range of motion, no joint swelling or tenderness, Skin: no rashes, lesions, ulcers. No induration Neurologic: CN 2-12 grossly intact. Sensation intact, DTR normal. Strength 5/5 in all 4.  Psychiatric: Normal judgment and insight. Alert and oriented x 2. Normal mood   Data Reviewed:    Latest  Ref Rng & Units 01/15/2024    8:14 AM 01/14/2024    2:05 AM 01/13/2024    5:45 PM  BMP  Glucose 70 - 99 mg/dL 95  59  22   BUN 8 - 23 mg/dL 57  57  65   Creatinine 0.44 - 1.00 mg/dL 3.87  3.75    3.77  3.61   Sodium 135 - 145 mmol/L 125  128  127    Potassium 3.5 - 5.1 mmol/L 4.6  5.4  6.0   Chloride 98 - 111 mmol/L 91  93  96   CO2 22 - 32 mmol/L 19  18  14    Calcium  8.9 - 10.3 mg/dL 7.7  8.5  8.5        Latest Ref Rng & Units 01/14/2024    5:41 AM 01/14/2024    2:05 AM 01/13/2024    5:45 PM  CBC  WBC 4.0 - 10.5 K/uL 7.6  8.0  8.2   Hemoglobin 12.0 - 15.0 g/dL 8.5  8.9  89.5   Hematocrit 36.0 - 46.0 % 25.0  26.5  31.7   Platelets 150 - 400 K/uL 237  254  321      Vitals:   01/15/24 0500 01/15/24 0603 01/15/24 0801 01/15/24 1510  BP:  (!) 86/49 (!) 95/52 107/66  Pulse:  67 60 65  Resp:   16 18  Temp:   (!) 97.4 F (36.3 C) 97.6 F (36.4 C)  TempSrc:   Axillary Oral  SpO2:   99% 97%  Weight: 103.6 kg        Author: Drue ONEIDA Potter, MD 01/15/2024 4:40 PM  For on call review [email or web address].

## 2024-01-15 NOTE — Care Management Important Message (Signed)
 Important Message  Patient Details  Name: Traci Carter MRN: [id number] Date of Birth: March 21, 1946   Important Message Given:  Yes - Medicare IM     Rojelio SHAUNNA Rattler 01/15/2024, 3:39 PM

## 2024-01-15 NOTE — Progress Notes (Signed)
 Dr Shona notified pt's bp is [phone]

## 2024-01-16 DIAGNOSIS — N179 Acute kidney failure, unspecified: Secondary | ICD-10-CM | POA: Diagnosis not present

## 2024-01-16 LAB — CBC WITH DIFFERENTIAL/PLATELET
Abs Immature Granulocytes: 0.04 K/uL (ref 0.00–0.07)
Basophils Absolute: 0 K/uL (ref 0.0–0.1)
Basophils Relative: 0 %
Eosinophils Absolute: 0.1 K/uL (ref 0.0–0.5)
Eosinophils Relative: 1 %
HCT: 24.5 % — ABNORMAL LOW (ref 36.0–46.0)
Hemoglobin: 8.5 g/dL — ABNORMAL LOW (ref 12.0–15.0)
Immature Granulocytes: 1 %
Lymphocytes Relative: 33 %
Lymphs Abs: 2.5 K/uL (ref 0.7–4.0)
MCH: 30.2 pg (ref 26.0–34.0)
MCHC: 34.7 g/dL (ref 30.0–36.0)
MCV: 87.2 fL (ref 80.0–100.0)
Monocytes Absolute: 0.7 K/uL (ref 0.1–1.0)
Monocytes Relative: 10 %
Neutro Abs: 4.2 K/uL (ref 1.7–7.7)
Neutrophils Relative %: 55 %
Platelets: 227 K/uL (ref 150–400)
RBC: 2.81 MIL/uL — ABNORMAL LOW (ref 3.87–5.11)
RDW: 14 % (ref 11.5–15.5)
WBC: 7.5 K/uL (ref 4.0–10.5)
nRBC: 0 % (ref 0.0–0.2)

## 2024-01-16 LAB — BASIC METABOLIC PANEL WITH GFR
Anion gap: 13 (ref 5–15)
BUN: 67 mg/dL — ABNORMAL HIGH (ref 8–23)
CO2: 23 mmol/L (ref 22–32)
Calcium: 7.9 mg/dL — ABNORMAL LOW (ref 8.9–10.3)
Chloride: 85 mmol/L — ABNORMAL LOW (ref 98–111)
Creatinine, Ser: 6.41 mg/dL — ABNORMAL HIGH (ref 0.44–1.00)
GFR, Estimated: 6 mL/min — ABNORMAL LOW (ref >60)
Glucose, Bld: 95 mg/dL (ref 70–99)
Potassium: 4.6 mmol/L (ref 3.5–5.1)
Sodium: 121 mmol/L — ABNORMAL LOW (ref 135–145)

## 2024-01-16 LAB — GLUCOSE, CAPILLARY
Glucose-Capillary: 83 mg/dL (ref 70–99)
Glucose-Capillary: 82 mg/dL (ref 70–99)
Glucose-Capillary: 139 mg/dL — ABNORMAL HIGH (ref 70–99)
Glucose-Capillary: 119 mg/dL — ABNORMAL HIGH (ref 70–99)

## 2024-01-16 NOTE — Plan of Care (Signed)

## 2024-01-16 NOTE — Progress Notes (Addendum)
 Progress Note   Patient: Traci Carter FMW:[id number] DOB: [birth year] DOA: 01/13/2024     3 DOS: the patient was seen and examined on 01/16/2024   Brief hospital course: From HPI Traci Carter is a 78 y.o. female with medical history significant of type 2 diabetes, essential hypertension, history of CVA, hyperlipidemia, chronic kidney disease stage III, obstructive sleep apnea, status post pacemaker, history of intracranial hemorrhage fibromyalgia, seizure disorder, depression and anxiety who was brought in by EMS from [facility] for abnormal labs.  Patient had routine blood work today that showed worsening renal function with a GFR of only 50.  Also elevated potassium.  She is drowsy at the moment but denied any complaint.  She is only awake alert and oriented x 2.  Patient was evaluated in the ER and found to have significant hyponatremia, potassium of 6.0, metabolic acidosis with bicarb 14, glucose only 22, BUN of 65 and creatinine 6.38.  She has a gap of 17.  Hemoglobin is 10.4, urinalysis essentially negative.  Renal ultrasound showed changes consistent with medical renal disease but no obstruction.  Nephrology consulted and patient is being admitted with acute on chronic kidney failure and other electrolyte abnormalities.     Assessment and Plan:   AKI on CKD 3:  Patient has creatinine jumped from 1.0 to less than 2 months ago to the level of 6.3.   She received IV fluid therapy with no improvement in renal function Nephrologist on board and since renal function not improving dialysis was recommended According to patient's daughter patient does not want dialysis Palliative care have been consulted for goals of care discussion Monitor renal function closely   Hyperkalemia status post Lokelma   Monitor potassium closely   Acute hyponatremia  Initially thought to be due to dehydration however patient have received some IV fluid resuscitation with no improvement in sodium  level. According to nephrologist this is as a result of lack of free water  clearance from worsening renal failure.  Nephrologist does not recommend placing her on 3% in light of severe renal failure as this will take patient into acute pulmonary edema. Given the poor prognosis nephrologist recommends palliative and hospice consultation Serum osmolarity of 277 Follow-up urine sodium and osmolarity level   Acute metabolic acidosis: Secondary to AKI.   Continue to monitor   Acute hypoglycemia:  Glucose of 22 on presentation Received dextrose  upon presentation IV dextrose  infusion discontinued Continue to monitor glucose level   Type 2 diabetes: Presenting with hypoglycemia Monitor glucose level closely Sliding scale therapy on board   Essential hypertension:  Continue blood pressure monitoring.   Coronary artery disease: Stable.   Continue to monitor   Paroxysmal atrial fibrillation: Rate is controlled.   Will continue with Eliquis .   This may be stopped if patient requires access for dialysis.   Obstructive sleep apnea:  Continue CPAP at night   History of CVA Continue statins    Advance Care Planning:   Code Status: Full Code    Consults: Dr. Lazarus, nephrology   Family Communication: No family at bedside     Subjective:  Seen and examined at bedside this morning Patient denies nausea vomiting abdominal pain chest pain cough Renal function worsening today Mental function worsened today compared to yesterday     Physical Exam: Eyes: PERRL, lids and conjunctivae normal ENMT: M Posterior pharynx clear of any exudate or lesions.Normal dentition.  Neck: normal, supple, no masses, no thyromegaly Respiratory: clear to auscultation bilaterally, no wheezing, no  crackles. Normal respiratory effort. No accessory muscle use.  Cardiovascular: Regular rate and rhythm, no murmurs / rubs / gallops. No extremity edema. 2+ pedal pulses. No carotid bruits.  Abdomen: no tenderness, no  masses palpated. No hepatosplenomegaly. Bowel sounds positive.  Musculoskeletal: Good range of motion, no joint swelling or tenderness, Skin: no rashes, lesions, ulcers. No induration Neurologic: CN 2-12 grossly intact. Sensation intact, DTR normal. Strength 5/5 in all 4.  Psychiatric: Normal judgment and insight. Alert and oriented x 2. Normal mood   Data Reviewed:    Latest Ref Rng & Units 01/16/2024    3:39 AM 01/15/2024    8:14 AM 01/14/2024    2:05 AM  BMP  Glucose 70 - 99 mg/dL 95  95  59   BUN 8 - 23 mg/dL 67  57  57   Creatinine 0.44 - 1.00 mg/dL 3.58  3.87  3.75    3.77   Sodium 135 - 145 mmol/L 121  125  128   Potassium 3.5 - 5.1 mmol/L 4.6  4.6  5.4   Chloride 98 - 111 mmol/L 85  91  93   CO2 22 - 32 mmol/L 23  19  18    Calcium  8.9 - 10.3 mg/dL 7.9  7.7  8.5        Latest Ref Rng & Units 01/16/2024    3:39 AM 01/14/2024    5:41 AM 01/14/2024    2:05 AM  CBC  WBC 4.0 - 10.5 K/uL 7.5  7.6  8.0   Hemoglobin 12.0 - 15.0 g/dL 8.5  8.5  8.9   Hematocrit 36.0 - 46.0 % 24.5  25.0  26.5   Platelets 150 - 400 K/uL 227  237  254     Vitals:   01/15/24 2002 01/15/24 2005 01/16/24 0424 01/16/24 0745  BP: 96/64  91/65 (!) 91/52  Pulse: (!) 59  69 (!) 59  Resp: 16   18  Temp:   98 F (36.7 C) 98.4 F (36.9 C)  TempSrc:      SpO2: (!) 84% 97% 100% 100%  Weight:         Author: Drue ONEIDA Potter, MD 01/16/2024 1:49 PM  For on call review [email or web address].

## 2024-01-16 NOTE — Progress Notes (Signed)
 [facility]  ROUNDING NOTE   Subjective:   Patient well-known to us  from the office for management of chronic kidney disease stage IIIA baseline eGFR 59.  Brought in from [facility] for abnormal labs including acute kidney injury and hyperkalemia.  Patient confused at presentation and unable to offer accurate history.  Creatinine 6.38 upon presentation with hyperkalemia of serum potassium of 6.0.  Update: Patient remains somnolent Arousable for short periods 2 L nasal cannula  Creatinine 6.41 Sodium 121  Objective:  Vital signs in last 24 hours:  Temp:  [97.6 F (36.4 C)-98.4 F (36.9 C)] 98.4 F (36.9 C) (10/14 0745) Pulse Rate:  [59-69] 59 (10/14 0745) Resp:  [16-18] 18 (10/14 0745) BP: (91-107)/(52-74) 91/52 (10/14 0745) SpO2:  [84 %-100 %] 100 % (10/14 0745)  Weight change:  Filed Weights   01/15/24 0500  Weight: 103.6 kg    Intake/Output: I/O last 3 completed shifts: In: 240 [P.O.:240] Out: 150 [Urine:150]   Intake/Output this shift:  No intake/output data recorded.  Physical Exam: General: Chronically ill-appearing  Head: Normocephalic, atraumatic.  Dry oral mucosa  Lungs:  Clear to auscultation, normal effort  Heart: S1S2 no rubs  Abdomen:  Soft, nontender, bowel sounds present  Extremities: Trace peripheral edema.  Neurologic: Somnolent  Skin: No acute rash  Access: No hemodialysis access    Basic Metabolic Panel: Recent Labs  Lab 01/13/24 1745 01/14/24 0205 01/15/24 0814 01/16/24 0339  NA 127* 128* 125* 121*  K 6.0* 5.4* 4.6 4.6  CL 96* 93* 91* 85*  CO2 14* 18* 19* 23  GLUCOSE 22* 59* 95 95  BUN 65* 57* 57* 67*  CREATININE 6.38* 6.22*  6.24* 6.12* 6.41*  CALCIUM  8.5* 8.5* 7.7* 7.9*  MG 1.7  --   --   --   PHOS 4.8* 4.7* 3.9  --     Liver Function Tests: Recent Labs  Lab 01/13/24 1745 01/14/24 0205 01/15/24 0814  AST 24 30  --   ALT 13 10  --   ALKPHOS 89 78  --   BILITOT 0.4 0.8  --   PROT 5.8* 4.7*  --    ALBUMIN  2.8* 2.2* 2.2*   No results for input(s): LIPASE, AMYLASE in the last 168 hours. No results for input(s): AMMONIA in the last 168 hours.  CBC: Recent Labs  Lab 01/13/24 1745 01/14/24 0205 01/14/24 0541 01/16/24 0339  WBC 8.2 8.0 7.6 7.5  NEUTROABS 5.2  --   --  4.2  HGB 10.4* 8.9* 8.5* 8.5*  HCT 31.7* 26.5* 25.0* 24.5*  MCV 92.7 91.4 90.3 87.2  PLT 321 254 237 227    Cardiac Enzymes: Recent Labs  Lab 01/14/24 0205  CKTOTAL 31*    BNP: Invalid input(s): POCBNP  CBG: Recent Labs  Lab 01/15/24 1128 01/15/24 1739 01/15/24 2023 01/16/24 0743 01/16/24 1137  GLUCAP 132* 175* 146* 83 82    Microbiology: Results for orders placed or performed during the hospital encounter of 01/13/24  MRSA Next Gen by PCR, Nasal     Status: Abnormal   Collection Time: 01/14/24 10:55 PM   Specimen: Nasal Mucosa; Nasal Swab  Result Value Ref Range Status   MRSA by PCR Next Gen DETECTED (A) NOT DETECTED Final    Comment: RESULT CALLED TO, READ BACK BY AND VERIFIED WITH: TAMARA CONYERS 01/15/24 0100 KLW (NOTE) The GeneXpert MRSA Assay (FDA approved for NASAL specimens only), is one component of a comprehensive MRSA colonization surveillance program. It is not intended to  diagnose MRSA infection nor to guide or monitor treatment for MRSA infections. Test performance is not FDA approved in patients less than 81 years old. Performed at [facility], [address] [place], [place] [address]     Coagulation Studies: Recent Labs    01/13/24 1745  LABPROT 15.0  INR 1.1    Urinalysis: Recent Labs    01/13/24 1854  COLORURINE YELLOW*  LABSPEC 1.014  PHURINE 5.0  GLUCOSEU NEGATIVE  HGBUR NEGATIVE  BILIRUBINUR NEGATIVE  KETONESUR NEGATIVE  PROTEINUR NEGATIVE  NITRITE NEGATIVE  LEUKOCYTESUR SMALL*      Imaging: No results found.    Medications:    sodium bicarbonate  150 mEq in sterile water  1,150 mL infusion 125 mL/hr at 01/16/24 1245     apixaban   5 mg Oral BID   atorvastatin   20 mg Oral Daily   Chlorhexidine  Gluconate Cloth  6 each Topical Daily   famotidine   20 mg Oral Daily   insulin  aspart  0-5 Units Subcutaneous QHS   insulin  aspart  0-6 Units Subcutaneous TID WC   levETIRAcetam   500 mg Oral BID   mupirocin ointment  1 Application Nasal BID   patiromer  16.8 g Oral Daily   acetaminophen  **OR** acetaminophen , calcium  carbonate (dosed in mg elemental calcium ), camphor-menthol **AND** hydrOXYzine , docusate sodium , feeding supplement (NEPRO CARB STEADY), ondansetron  **OR** ondansetron  (ZOFRAN ) IV, sorbitol  Assessment/ Plan:  78 y.o. female with past medical history of atrial fibrillation, dual-chamber pacemaker for complete heart block, diabetes mellitus type 2, chronic kidney disease stage IIIa, hyperlipidemia, coronary disease, hypertension, intracranial hemorrhage, CVA, GERD, seizure disorder, fibromyalgia, depression/anxiety, gout, obstructive sleep apnea, history of multiple UTIs who presented with multiple metabolic derangements including acute kidney injury, hyperkalemia, and acute metabolic acidosis.  1.  Acute kidney injury/chronic kidney disease stage IIIa baseline eGFR 59.  Patient with significant acute kidney injury.  Renal ultrasound done and negative for hydronephrosis.    Creatinine continues to rise slowly. Minimal urine output recorded on night shift. If no improvement, may have to consider renal replacement therapy. Attempted to call and speak with daughter, [name], no answer.  Will continue to monitor renal indices.  Will order hepatitis B panel in preparation.  2.  Hyperkalemia.  Serum potassium remains 4.6.  Will hold any further [place].  3.  Acute metabolic acidosis.  Serum bicarbonate up to 23.   Serum levels corrected with IV infusion.    LOS: 3 Malayasia Mirkin 10/14/20251:05 PM

## 2024-01-17 DIAGNOSIS — Z515 Encounter for palliative care: Secondary | ICD-10-CM | POA: Diagnosis not present

## 2024-01-17 DIAGNOSIS — I480 Paroxysmal atrial fibrillation: Secondary | ICD-10-CM | POA: Diagnosis not present

## 2024-01-17 DIAGNOSIS — E162 Hypoglycemia, unspecified: Secondary | ICD-10-CM

## 2024-01-17 DIAGNOSIS — E871 Hypo-osmolality and hyponatremia: Secondary | ICD-10-CM | POA: Insufficient documentation

## 2024-01-17 DIAGNOSIS — E875 Hyperkalemia: Secondary | ICD-10-CM | POA: Diagnosis not present

## 2024-01-17 DIAGNOSIS — N179 Acute kidney failure, unspecified: Secondary | ICD-10-CM | POA: Diagnosis not present

## 2024-01-17 DIAGNOSIS — E66813 Obesity, class 3: Secondary | ICD-10-CM | POA: Diagnosis not present

## 2024-01-17 LAB — BASIC METABOLIC PANEL WITH GFR
Anion gap: 15 (ref 5–15)
BUN: 64 mg/dL — ABNORMAL HIGH (ref 8–23)
CO2: 28 mmol/L (ref 22–32)
Calcium: 7.5 mg/dL — ABNORMAL LOW (ref 8.9–10.3)
Chloride: 80 mmol/L — ABNORMAL LOW (ref 98–111)
Creatinine, Ser: 6.62 mg/dL — ABNORMAL HIGH (ref 0.44–1.00)
GFR, Estimated: 6 mL/min — ABNORMAL LOW (ref >60)
Glucose, Bld: 97 mg/dL (ref 70–99)
Potassium: 4.5 mmol/L (ref 3.5–5.1)
Sodium: 123 mmol/L — ABNORMAL LOW (ref 135–145)

## 2024-01-17 LAB — GLUCOSE, CAPILLARY
Glucose-Capillary: 83 mg/dL (ref 70–99)
Glucose-Capillary: 137 mg/dL — ABNORMAL HIGH (ref 70–99)
Glucose-Capillary: 122 mg/dL — ABNORMAL HIGH (ref 70–99)
Glucose-Capillary: 161 mg/dL — ABNORMAL HIGH (ref 70–99)

## 2024-01-17 LAB — CBC WITH DIFFERENTIAL/PLATELET
Abs Immature Granulocytes: 0.04 K/uL (ref 0.00–0.07)
Basophils Absolute: 0 K/uL (ref 0.0–0.1)
Basophils Relative: 0 %
Eosinophils Absolute: 0.1 K/uL (ref 0.0–0.5)
Eosinophils Relative: 1 %
HCT: 24.6 % — ABNORMAL LOW (ref 36.0–46.0)
Hemoglobin: 8.4 g/dL — ABNORMAL LOW (ref 12.0–15.0)
Immature Granulocytes: 1 %
Lymphocytes Relative: 18 %
Lymphs Abs: 1.5 K/uL (ref 0.7–4.0)
MCH: 29.6 pg (ref 26.0–34.0)
MCHC: 34.1 g/dL (ref 30.0–36.0)
MCV: 86.6 fL (ref 80.0–100.0)
Monocytes Absolute: 0.8 K/uL (ref 0.1–1.0)
Monocytes Relative: 9 %
Neutro Abs: 6 K/uL (ref 1.7–7.7)
Neutrophils Relative %: 71 %
Platelets: 200 K/uL (ref 150–400)
RBC: 2.84 MIL/uL — ABNORMAL LOW (ref 3.87–5.11)
RDW: 13.8 % (ref 11.5–15.5)
WBC: 8.3 K/uL (ref 4.0–10.5)
nRBC: 0 % (ref 0.0–0.2)

## 2024-01-17 MED ORDER — HALOPERIDOL LACTATE 2 MG/ML PO CONC: 0.5000 mg | ORAL | Status: DC | PRN

## 2024-01-17 MED ORDER — BIOTENE DRY MOUTH MT LIQD: 15.0000 mL | OROMUCOSAL | Status: DC | PRN

## 2024-01-17 MED ORDER — GLYCOPYRROLATE 0.2 MG/ML IJ SOLN: 0.2000 mg | INTRAMUSCULAR | Status: DC | PRN

## 2024-01-17 MED ORDER — FENTANYL CITRATE (PF) 50 MCG/ML IJ SOSY: 12.5000 ug | PREFILLED_SYRINGE | INTRAMUSCULAR | Status: DC | PRN

## 2024-01-17 MED ORDER — POLYVINYL ALCOHOL 1.4 % OP SOLN: 1.0000 [drp] | Freq: Four times a day (QID) | OPHTHALMIC | Status: DC | PRN

## 2024-01-17 MED ORDER — HALOPERIDOL 0.5 MG PO TABS: 0.5000 mg | ORAL_TABLET | ORAL | Status: DC | PRN

## 2024-01-17 MED ORDER — GLYCOPYRROLATE 1 MG PO TABS: 1.0000 mg | ORAL_TABLET | ORAL | Status: DC | PRN

## 2024-01-17 MED ORDER — HALOPERIDOL LACTATE 5 MG/ML IJ SOLN
0.5000 mg | INTRAMUSCULAR | Status: DC | PRN
Start: 2024-01-17 — End: 2024-01-18

## 2024-01-17 NOTE — Progress Notes (Signed)
 Progress Note   Patient: Traci Carter FMW:[id number] DOB: 24-Oct-1945 DOA: 01/13/2024     4 DOS: the patient was seen and examined on 01/17/2024   Brief hospital course: From HPI Traci Carter is a 78 y.o. female with medical history significant of type 2 diabetes, essential hypertension, history of CVA, hyperlipidemia, chronic kidney disease stage III, obstructive sleep apnea, status post pacemaker, history of intracranial hemorrhage fibromyalgia, seizure disorder, depression and anxiety who was brought in by EMS from [facility] for abnormal labs.  Patient had routine blood work today that showed worsening renal function with a GFR of only 50.  Also elevated potassium.  She is drowsy at the moment but denied any complaint.  Seen by nephrology, has refused dialysis Over the course of the last 3 days, patient was placed on fluids, renal function continued to deteriorate.  Sodium level still low.  Nephrology has recommended palliative care/hospice.   Principal Problem:   AKI (acute kidney injury) Active Problems:   Diabetes (HCC)   Essential hypertension   Paroxysmal atrial fibrillation (HCC)   Hyperlipidemia associated with type 2 diabetes mellitus (HCC)   Pacemaker   History of CVA (cerebrovascular accident)   OSA (obstructive sleep apnea)   CAD (coronary artery disease)   Diabetes mellitus type 2, uncomplicated (HCC)   CKD (chronic kidney disease), stage IIIa   Class 3 obesity (HCC)   Hyponatremia   Assessment and Plan: Acute renal failure on chronic kidney disease stage IIIa. Hyperkalemia secondary to acute renal failure. Metabolic acidosis secondary to kidney failure. Severe hyponatremia secondary to renal failure. Patient renal function continued to deteriorate so after fluids.  Bladder scan did not show any urinary retention. Patient renal function could be natural progression approaching to the end stage. Discussed with patient and patient daughter, patient  does continue to refuse any consideration of dialysis. With the progression of her disease, patient life expectancy probably is less than 3 months. Patient will be discharged with a follow-up with palliative care versus hospice.  Essential hypertension. Blood pressure no significant elevated  Paroxysmal atrial fibrillation. Currently on Eliquis .  Obstructive sleep apnea. Class III obesity with BMI of 41.83. Continues CVA.  History of hemorrhagic stroke. Possible dementia Patient is on CPAP at night.  CODE STATUS. Discussed with the patient daughter, given patient poor prognosis, she she has agreed patient should be DNR.    Subjective:  Has some confusion, deny any shortness of breath.  Physical Exam: Vitals:   01/16/24 2006 01/17/24 0428 01/17/24 0500 01/17/24 0819  BP: (!) 105/59 (!) 103/53  (!) 104/45  Pulse: 60 65  67  Resp: 20 16  20   Temp: (!) 97.5 F (36.4 C) (!) 97.5 F (36.4 C)  98.2 F (36.8 C)  TempSrc: Oral Oral    SpO2: 100% 99%  100%  Weight:   107.1 kg    General exam: Appears calm and comfortable  Respiratory system: Clear to auscultation. Respiratory effort normal. Cardiovascular system: S1 & S2 heard, RRR. No JVD, murmurs, rubs, gallops or clicks. No pedal edema. Gastrointestinal system: Abdomen is nondistended, soft and nontender. No organomegaly or masses felt. Normal bowel sounds heard. Central nervous system: Alert and oriented x2. No focal neurological deficits. Extremities: Symmetric 5 x 5 power. Skin: No rashes, lesions or ulcers Psychiatry:  Mood & affect appropriate.    Data Reviewed:  Lab results reviewed.  Family Communication: Daughter updated over the phone.  Disposition: Status is: Inpatient Remains inpatient appropriate because: Severity of disease,  Time spent: 50 minutes  Author: Murvin Mana, MD 01/17/2024 12:46 PM  For on call review [email or web address].

## 2024-01-17 NOTE — Progress Notes (Addendum)
 [facility] Room 204 [facility] Liaison Note  Referral received today from [facility], Corean Haddock, RN,  for [facility] evaluation. HL will follow up tomorrow to complete evaluation.    Please call with any hospice related questions or concerns  Thank you for the opportunity to participate in this patient's care.  [facility] [phone]

## 2024-01-17 NOTE — Evaluation (Signed)
 Occupational Therapy Evaluation Patient Details Name: Traci Carter MRN: [id number] DOB: [birth year] Today's Date: 01/17/2024   History of Present Illness   Patient is a 78 year old female coming from [facility] with abnormal labs, worsening renal function and elevated potassium. PMH:  type 2 diabetes, essential hypertension, history of CVA, hyperlipidemia, chronic kidney disease stage III, obstructive sleep apnea, status post pacemaker, history of intracranial hemorrhage fibromyalgia, seizure disorder, depression and anxiety     Clinical Impressions Pt admitted with above. Pt from STR where she required assist to stand with therapy team, and was able to self-feed with staff assisting with all other BADLs. Pt confused, perseverates on topics and requires redirection to terminate grooming tasks. Pt performs bed mobility with MAX A +2, sits EOB with close CGA-supervision to wash face, and attempts to perform 2x STS using RW. Unable to fully rise upright with forward flexed posture given MAX A +2. Pt presents with generalized weakness, poor tolerance to activity, and impaired balance, strength and mobility which effects ADL performance. Pt would benefit from skilled OT services to address noted impairments and functional limitations (see below for any additional details) in order to maximize safety and independence while minimizing falls risk and caregiver burden. Anticipate the need for follow up OT services upon acute hospital DC.      If plan is discharge home, recommend the following:   A little help with bathing/dressing/bathroom;Two people to help with bathing/dressing/bathroom;Assistance with feeding;Direct supervision/assist for medications management;Direct supervision/assist for financial management;Assist for transportation;Help with stairs or ramp for entrance;Supervision due to cognitive status     Functional Status Assessment   Patient has had a recent decline in their  functional status and demonstrates the ability to make significant improvements in function in a reasonable and predictable amount of time.     Equipment Recommendations   Other (comment) (defer to next LOC)     Recommendations for Other Services         Precautions/Restrictions   Precautions Precautions: Fall Recall of Precautions/Restrictions: Impaired Restrictions Weight Bearing Restrictions Per Provider Order: No     Mobility Bed Mobility Overal bed mobility: Needs Assistance Bed Mobility: Supine to Sit, Sit to Supine     Supine to sit: Max assist, +2 for physical assistance Sit to supine: Max assist, +2 for physical assistance   General bed mobility comments: assist for BLE and trunk support    Transfers Overall transfer level: Needs assistance Equipment used: Rolling walker (2 wheels) Transfers: Sit to/from Stand Sit to Stand: Max assist, +2 physical assistance           General transfer comment: pt unable to stand with 2x attempts. flexed trunk and unable to achieve full upright posture      Balance Overall balance assessment: Needs assistance Sitting-balance support: Feet supported Sitting balance-Leahy Scale: Good Sitting balance - Comments: stedy while performing grooming tasks at EOB   Standing balance support: Bilateral upper extremity supported, Reliant on assistive device for balance Standing balance-Leahy Scale: Poor Standing balance comment: external support required. standing tolerance of a few seconds                           ADL either performed or assessed with clinical judgement   ADL Overall ADL's : Needs assistance/impaired     Grooming: Wash/dry hands;Wash/dry face;Sitting Grooming Details (indicate cue type and reason): sitting at EOB, perseverates and requires cues to terminate task  Toilet Transfer: Requires wide/bariatric;+2 for physical assistance;+2 for safety/equipment;Maximal  assistance Toilet Transfer Details (indicate cue type and reason): anticipate use of Stedy with +2 for transfers, bed pan for toileting needs         Functional mobility during ADLs: Maximal assistance;+2 for physical assistance General ADL Comments: pt confused, perseverates throughout evaluation. anticipate need for stedy +2 for further transfers. max-total A for ADLs with body habitus     Vision Baseline Vision/History: 1 Wears glasses Ability to See in Adequate Light: 0 Adequate              Pertinent Vitals/Pain Pain Assessment Pain Assessment: No/denies pain     Extremity/Trunk Assessment Upper Extremity Assessment Upper Extremity Assessment: Generalized weakness   Lower Extremity Assessment Lower Extremity Assessment: Generalized weakness       Communication Communication Communication: No apparent difficulties   Cognition Arousal: Alert Behavior During Therapy: WFL for tasks assessed/performed Cognition: Cognition impaired   Orientation impairments: Situation, Place, Time Awareness: Intellectual awareness impaired Memory impairment (select all impairments): Short-term memory, Working [employer] fast streamer, Non-declarative long-term memory, Geneticist, molecular long-term memory Attention impairment (select first level of impairment): Focused attention Executive functioning impairment (select all impairments): Problem solving, Reasoning OT - Cognition Comments: pt confused, often perseverating on topics during session                 Following commands: Impaired Following commands impaired: Follows one step commands with increased time     Cueing  General Comments   Cueing Techniques: Verbal cues;Tactile cues  patient does report fatigue with activity           Home Living Family/patient expects to be discharged to:: Skilled nursing facility                                 Additional Comments: from [facility] for short term rehab      Prior  Functioning/Environment Prior Level of Function : Needs assist             Mobility Comments: assist required for standing and transfers at [facility] ADLs Comments: able to feed self, requires assist for all other ADLs    OT Problem List: Decreased strength;Decreased range of motion;Decreased activity tolerance;Impaired balance (sitting and/or standing);Obesity;Decreased coordination;Decreased knowledge of precautions;Decreased knowledge of use of DME or AE;Decreased safety awareness   OT Treatment/Interventions: Self-care/ADL training;Therapeutic exercise;Neuromuscular education;Energy conservation;DME and/or AE instruction;Therapeutic activities;Patient/family education;Balance training      OT Goals(Current goals can be found in the care plan section)   Acute Rehab OT Goals OT Goal Formulation: With patient Time For Goal Achievement: 01/31/24 Potential to Achieve Goals: Fair   OT Frequency:  Min 2X/week    Co-evaluation PT/OT/SLP Co-Evaluation/Treatment: Yes Reason for Co-Treatment: To address functional/ADL transfers PT goals addressed during session: Mobility/safety with mobility OT goals addressed during session: ADL's and self-care      AM-PAC OT 6 Clicks Daily Activity     Outcome Measure Help from another person eating meals?: A Little Help from another person taking care of personal grooming?: A Lot Help from another person toileting, which includes using toliet, bedpan, or urinal?: Total Help from another person bathing (including washing, rinsing, drying)?: Total Help from another person to put on and taking off regular upper body clothing?: Total Help from another person to put on and taking off regular lower body clothing?: Total 6 Click Score: 9   End of Session Equipment Utilized During Treatment:  Rolling walker (2 wheels) Nurse Communication: Mobility status  Activity Tolerance: Patient tolerated treatment well Patient left: in bed;with call bell/phone  within reach;with bed alarm set  OT Visit Diagnosis: Muscle weakness (generalized) (M62.81);Other abnormalities of gait and mobility (R26.89);Unsteadiness on feet (R26.81)                Time: 8980-8962 OT Time Calculation (min): 18 min Charges:  OT General Charges $OT Visit: 1 Visit OT Evaluation $OT Eval Moderate Complexity: 1 Mod  Ludella Pranger L. Cochise Dinneen, OTR/L  01/17/24, 1:59 PM

## 2024-01-17 NOTE — Progress Notes (Signed)
 [facility]  ROUNDING NOTE   Subjective:   Patient well-known to us  from the office for management of chronic kidney disease stage IIIA baseline eGFR 59.  Brought in from [facility] for abnormal labs including acute kidney injury and hyperkalemia.  Patient confused at presentation and unable to offer accurate history.  Creatinine 6.38 upon presentation with hyperkalemia of serum potassium of 6.0.  Update: Patient resting quietly No family present 2 L nasal cannula IV fluids infusing.  Creatinine 6.62 Sodium 123  Objective:  Vital signs in last 24 hours:  Temp:  [97.5 F (36.4 C)-98.2 F (36.8 C)] 98.2 F (36.8 C) (10/15 0819) Pulse Rate:  [59-67] 67 (10/15 0819) Resp:  [16-20] 20 (10/15 0819) BP: (96-105)/(44-59) 104/45 (10/15 0819) SpO2:  [99 %-100 %] 100 % (10/15 0819) Weight:  [107.1 kg] 107.1 kg (10/15 0500)  Weight change:  Filed Weights   01/15/24 0500 01/17/24 0500  Weight: 103.6 kg 107.1 kg    Intake/Output: I/O last 3 completed shifts: In: 0  Out: 650 [Urine:650]   Intake/Output this shift:  Total I/O In: 120 [P.O.:120] Out: 250 [Urine:250]  Physical Exam: General: Chronically ill-appearing  Head: Normocephalic, atraumatic.  Dry oral mucosa  Lungs:  Clear to auscultation, normal effort  Heart: S1S2 no rubs  Abdomen:  Soft, nontender, bowel sounds present  Extremities: Trace peripheral edema.  Neurologic: Somnolent  Skin: No acute rash  Access: No hemodialysis access    Basic Metabolic Panel: Recent Labs  Lab 01/13/24 1745 01/14/24 0205 01/15/24 0814 01/16/24 0339 01/17/24 0603  NA 127* 128* 125* 121* 123*  K 6.0* 5.4* 4.6 4.6 4.5  CL 96* 93* 91* 85* 80*  CO2 14* 18* 19* 23 28  GLUCOSE 22* 59* 95 95 97  BUN 65* 57* 57* 67* 64*  CREATININE 6.38* 6.22*  6.24* 6.12* 6.41* 6.62*  CALCIUM  8.5* 8.5* 7.7* 7.9* 7.5*  MG 1.7  --   --   --   --   PHOS 4.8* 4.7* 3.9  --   --     Liver Function Tests: Recent Labs  Lab  01/13/24 1745 01/14/24 0205 01/15/24 0814  AST 24 30  --   ALT 13 10  --   ALKPHOS 89 78  --   BILITOT 0.4 0.8  --   PROT 5.8* 4.7*  --   ALBUMIN  2.8* 2.2* 2.2*   No results for input(s): LIPASE, AMYLASE in the last 168 hours. No results for input(s): AMMONIA in the last 168 hours.  CBC: Recent Labs  Lab 01/13/24 1745 01/14/24 0205 01/14/24 0541 01/16/24 0339 01/17/24 0603  WBC 8.2 8.0 7.6 7.5 8.3  NEUTROABS 5.2  --   --  4.2 6.0  HGB 10.4* 8.9* 8.5* 8.5* 8.4*  HCT 31.7* 26.5* 25.0* 24.5* 24.6*  MCV 92.7 91.4 90.3 87.2 86.6  PLT 321 254 237 227 200    Cardiac Enzymes: Recent Labs  Lab 01/14/24 0205  CKTOTAL 31*    BNP: Invalid input(s): POCBNP  CBG: Recent Labs  Lab 01/16/24 1137 01/16/24 1655 01/16/24 2132 01/17/24 0814 01/17/24 1135  GLUCAP 82 139* 119* 83 137*    Microbiology: Results for orders placed or performed during the hospital encounter of 01/13/24  MRSA Next Gen by PCR, Nasal     Status: Abnormal   Collection Time: 01/14/24 10:55 PM   Specimen: Nasal Mucosa; Nasal Swab  Result Value Ref Range Status   MRSA by PCR Next Gen DETECTED (A) NOT DETECTED Final  Comment: RESULT CALLED TO, READ BACK BY AND VERIFIED WITH: TAMARA CONYERS 01/15/24 0100 KLW (NOTE) The GeneXpert MRSA Assay (FDA approved for NASAL specimens only), is one component of a comprehensive MRSA colonization surveillance program. It is not intended to diagnose MRSA infection nor to guide or monitor treatment for MRSA infections. Test performance is not FDA approved in patients less than 7 years old. Performed at [facility], [address] [place], [place] [address]     Coagulation Studies: No results for input(s): LABPROT, INR in the last 72 hours.   Urinalysis: No results for input(s): COLORURINE, LABSPEC, PHURINE, GLUCOSEU, HGBUR, BILIRUBINUR, KETONESUR, PROTEINUR, UROBILINOGEN, NITRITE, LEUKOCYTESUR in the last 72  hours.  Invalid input(s): APPERANCEUR     Imaging: No results found.    Medications:      apixaban   5 mg Oral BID   atorvastatin   20 mg Oral Daily   Chlorhexidine  Gluconate Cloth  6 each Topical Daily   famotidine   20 mg Oral Daily   insulin  aspart  0-5 Units Subcutaneous QHS   insulin  aspart  0-6 Units Subcutaneous TID WC   levETIRAcetam   500 mg Oral BID   mupirocin ointment  1 Application Nasal BID   patiromer  16.8 g Oral Daily   acetaminophen  **OR** acetaminophen , calcium  carbonate (dosed in mg elemental calcium ), camphor-menthol **AND** hydrOXYzine , docusate sodium , feeding supplement (NEPRO CARB STEADY), ondansetron  **OR** ondansetron  (ZOFRAN ) IV, sorbitol  Assessment/ Plan:  78 y.o. female with past medical history of atrial fibrillation, dual-chamber pacemaker for complete heart block, diabetes mellitus type 2, chronic kidney disease stage IIIa, hyperlipidemia, coronary disease, hypertension, intracranial hemorrhage, CVA, GERD, seizure disorder, fibromyalgia, depression/anxiety, gout, obstructive sleep apnea, history of multiple UTIs who presented with multiple metabolic derangements including acute kidney injury, hyperkalemia, and acute metabolic acidosis.  1.  Acute kidney injury/chronic kidney disease stage IIIa baseline eGFR 59.  Patient with significant acute kidney injury.  Renal ultrasound done and negative for hydronephrosis.    Renal function continues to decline. Spoke with daughter, [name], via phone and she states her mother does not want to pursue dialysis and is ready die. Can continue supportive measures. Would decrease IVF to prevent fluid overload. Recommend palliative care consult.   2.  Hyperkalemia.  Serum potassium 4.5.  Will continue to monitor and medically manage.   3.  Acute metabolic acidosis.  Serum bicarbonate corrected with IVF.   4. Hyponatremia, persistent hyponatremia since admission. S sodium 127 on admission, decreased to 121. Likely  due to lack of free of water  clearance due to renal failure. Would not place patient on hypertonic saline due to concern for volume overload. Agree with palliative care consult    LOS: 4 Branda Chaudhary 10/15/202512:16 PM

## 2024-01-17 NOTE — TOC Progression Note (Signed)
 Transition of Care [facility]) - Progression Note    Patient Details  Name: Traci Carter MRN: [id number] Date of Birth: 17-Feb-1946  Transition [facility]) CM/SW Contact  Corean ONEIDA Haddock, RN Phone Number: 01/17/2024, 3:25 PM  Clinical Narrative:     Notified by palliative that daughter is requesting patient be evaluated for inpatient hospice.  Call daughter [name] and she confirmed, and would prefer [facility].  Referral made to jack with [facility]                     Expected Discharge Plan and Services                                               Social Drivers of Health (SDOH) Interventions SDOH Screenings   Food Insecurity: No Food Insecurity (01/14/2024)  Housing: Low Risk  (01/14/2024)  Transportation Needs: No Transportation Needs (01/15/2024)  Utilities: Not At Risk (01/14/2024)  Financial Resource Strain: Low Risk  (09/25/2020)   Received from [facility]  Social Connections: Moderately Isolated (01/16/2024)  Tobacco Use: Low Risk  (12/13/2023)   Received from [facility]    Readmission Risk Interventions    01/17/2024   10:17 AM 10/17/2023    3:58 PM  Readmission Risk Prevention Plan  PCP or Specialist Appt within 3-5 Days  Complete  Social Work Consult for Recovery Care Planning/Counseling  Complete  Palliative Care Screening  Not Applicable  Medication Review Oceanographer) Complete   Palliative Care Screening Complete   Skilled Nursing Facility Complete

## 2024-01-17 NOTE — Evaluation (Signed)
 Physical Therapy Evaluation Patient Details Name: Traci Carter MRN: [id number] DOB: 01/28/1946 Today's Date: 01/17/2024  History of Present Illness  Patient is a 78 year old female coming from [facility] with abnormal labs, worsening renal function and elevated potassium. PMH:  type 2 diabetes, essential hypertension, history of CVA, hyperlipidemia, chronic kidney disease stage III, obstructive sleep apnea, status post pacemaker, history of intracranial hemorrhage fibromyalgia, seizure disorder, depression and anxiety  Clinical Impression  Patient is agreeable to PT evaluation. She seems mildly confused about recent events and situation, but is cooperative throughout session. She is from SNF where she reports needing assistance for transfers with no ambulation.  Today the patient required +2 person assistance for bed mobility. She was able to clear buttocks from bed but unable to stand fully with flexed posture with +2 person assistance required. Standing tolerance of only a few seconds with activity tolerance limited by fatigue. PT will continue to follow to maximize independence and decrease caregiver burden. Rehabilitation < 3 hours/day recommended after this hospital stay pending patient/family goals.       If plan is discharge home, recommend the following: Two people to help with walking and/or transfers;Two people to help with bathing/dressing/bathroom;Help with stairs or ramp for entrance;Supervision due to cognitive status;Assistance with cooking/housework;Direct supervision/assist for medications management;Direct supervision/assist for financial management;Assist for transportation   Can travel by private vehicle   No    Equipment Recommendations None recommended by PT  Recommendations for Other Services       Functional Status Assessment Patient has had a recent decline in their functional status and demonstrates the ability to make significant improvements in function  in a reasonable and predictable amount of time.     Precautions / Restrictions Precautions Precautions: Fall Recall of Precautions/Restrictions: Impaired      Mobility  Bed Mobility Overal bed mobility: Needs Assistance Bed Mobility: Supine to Sit, Sit to Supine     Supine to sit: Max assist, +2 for physical assistance Sit to supine: Max assist, +2 for physical assistance   General bed mobility comments: assistance for LE and trunk support. cues for technique    Transfers Overall transfer level: Needs assistance Equipment used: Rolling walker (2 wheels) Transfers: Sit to/from Stand Sit to Stand: Max assist, +2 physical assistance           General transfer comment: patient able to clear buttocks from bed but with flexed trunk and unable to stand fully. cues for hand placement with transfers. unable to stand on the first attempt    Ambulation/Gait               General Gait Details: unable to  Stairs            Wheelchair Mobility     Tilt Bed    Modified Rankin (Stroke Patients Only)       Balance Overall balance assessment: Needs assistance Sitting-balance support: Feet supported Sitting balance-Leahy Scale: Good     Standing balance support: Bilateral upper extremity supported, Reliant on assistive device for balance Standing balance-Leahy Scale: Poor Standing balance comment: external support required. standing tolerance of a few seconds                             Pertinent Vitals/Pain Pain Assessment Pain Assessment: No/denies pain    Home Living Family/patient expects to be discharged to:: Skilled nursing facility  Additional Comments: from [facility] for short term rehab    Prior Function Prior Level of Function : Needs assist             Mobility Comments: per patient report, assist required for transfers. working with PT on standing, was not ambulating       Extremity/Trunk Assessment    Upper Extremity Assessment Upper Extremity Assessment: Defer to OT evaluation    Lower Extremity Assessment Lower Extremity Assessment: Generalized weakness       Communication   Communication Communication: No apparent difficulties    Cognition Arousal: Alert Behavior During Therapy: WFL for tasks assessed/performed   PT - Cognitive impairments: No family/caregiver present to determine baseline, Attention, Sequencing, Initiation, Memory                       PT - Cognition Comments: Patient is a poor historian. She was distoriented to place, situation. Following commands: Impaired Following commands impaired: Follows one step commands with increased time     Cueing Cueing Techniques: Verbal cues, Tactile cues     General Comments General comments (skin integrity, edema, etc.): patient does report fatigue with activity    Exercises     Assessment/Plan    PT Assessment Patient needs continued PT services  PT Problem List Decreased strength;Decreased range of motion;Decreased balance;Decreased mobility;Decreased activity tolerance;Decreased safety awareness;Decreased cognition       PT Treatment Interventions DME instruction;Gait training;Functional mobility training;Balance training;Therapeutic exercise;Therapeutic activities;Neuromuscular re-education;Patient/family education;Cognitive remediation;Wheelchair mobility training    PT Goals (Current goals can be found in the Care Plan section)  Acute Rehab PT Goals Patient Stated Goal: to get back to walking PT Goal Formulation: With patient Time For Goal Achievement: 01/31/24 Potential to Achieve Goals: Fair Additional Goals Additional Goal #1: patient will increase standing tolerance to 3 minutes with no more than Min A required using rolling walker in preparation for ambulation    Frequency Min 1X/week     Co-evaluation PT/OT/SLP Co-Evaluation/Treatment: Yes Reason for Co-Treatment: To address  functional/ADL transfers PT goals addressed during session: Mobility/safety with mobility         AM-PAC PT 6 Clicks Mobility  Outcome Measure Help needed turning from your back to your side while in a flat bed without using bedrails?: A Lot Help needed moving from lying on your back to sitting on the side of a flat bed without using bedrails?: Total Help needed moving to and from a bed to a chair (including a wheelchair)?: Total Help needed standing up from a chair using your arms (e.g., wheelchair or bedside chair)?: Total Help needed to walk in hospital room?: Total Help needed climbing 3-5 steps with a railing? : Total 6 Click Score: 7    End of Session   Activity Tolerance: Patient limited by fatigue Patient left: in bed;with call bell/phone within reach;with bed alarm set   PT Visit Diagnosis: Unsteadiness on feet (R26.81);Muscle weakness (generalized) (M62.81)    Time: 8981-8961 PT Time Calculation (min) (ACUTE ONLY): 20 min   Charges:   PT Evaluation $PT Eval Moderate Complexity: 1 Mod   PT General Charges $$ ACUTE PT VISIT: 1 Visit        Randine Essex, PT, MPT   Randine LULLA Essex 01/17/2024, 10:56 AM

## 2024-01-17 NOTE — TOC Initial Note (Signed)
 Transition of Care [facility]) - Initial/Assessment Note    Patient Details  Name: Traci Carter MRN: [id number] Date of Birth: [birth year]  Transition of Care [facility]) CM/SW Contact:    Daved JONETTA Hamilton, RN Phone Number: 01/17/2024, 10:18 AM  Clinical Narrative:                  Noted patient from [facility] where she was receiving short term rehab. Pallative care consult pending for goals of care.          Patient Goals and CMS Choice            Expected Discharge Plan and Services                                              Prior Living Arrangements/Services                       Activities of Daily Living   ADL Screening (condition at time of admission) Independently performs ADLs?: No  Permission Sought/Granted                  Emotional Assessment              Admission diagnosis:  Hyperkalemia [E87.5] Hyponatremia [E87.1] Hypoglycemia [E16.2] AKI (acute kidney injury) [N17.9] Acute renal failure, unspecified acute renal failure type [N17.9] Patient Active Problem List   Diagnosis Date Noted   Class 3 obesity (HCC) 01/17/2024   Hyponatremia 01/17/2024   AKI (acute kidney injury) 01/13/2024   Sepsis (HCC) 11/20/2023   Acute renal failure with tubular necrosis 10/17/2023   Hypovolemic shock (HCC) 10/17/2023   Severe sepsis with septic shock (HCC) 10/17/2023   Metabolic acidosis 10/17/2023   Altered mental status 10/16/2023   Ambien  accidental overdose, initial encounter 02/09/2021   Hypokalemia 02/09/2021   Hypocalcemia 02/09/2021   Acquired thrombophilia 01/16/2021   UTI (urinary tract infection) 01/14/2021   Acute metabolic encephalopathy 01/14/2021   Gout flare 01/14/2021   Hyperglycemia due to type 2 diabetes mellitus (HCC) 01/14/2021   Atrial fibrillation, chronic (HCC) 01/14/2021   HTN (hypertension) 01/14/2021   History of CVA (cerebrovascular accident) 01/14/2021   Fracture of femoral condyle, right,  closed (HCC) 10/17/2020   Generalized weakness 09/16/2020   Type II diabetes mellitus with renal manifestations (HCC) 09/16/2020   CKD (chronic kidney disease), stage IIIa 09/16/2020   Depression with anxiety 09/16/2020   Fall 09/16/2020   UTI (urinary tract infection) 09/16/2020   Seizure (HCC) 09/16/2020   Diabetes mellitus type 2, uncomplicated (HCC) 09/10/2019   Chest pain with low risk for cardiac etiology 12/27/2018   SOB (shortness of breath) on exertion 12/27/2018   Paroxysmal atrial fibrillation (HCC) 10/16/2018   Hyperlipidemia associated with type 2 diabetes mellitus (HCC) 10/16/2018   Pacemaker 10/16/2018   History of CVA (cerebrovascular accident) 10/16/2018   OSA (obstructive sleep apnea) 10/16/2018   Fibromyalgia 10/16/2018   CAD (coronary artery disease) 10/16/2018   Diarrhea 06/09/2017   Hyperlipemia 08/15/2013   ICH (intracerebral hemorrhage) (HCC) 07/21/2012   Diabetes (HCC) 07/21/2012   Essential hypertension 07/21/2012   CVA (cerebral vascular accident) (HCC) 07/20/2012   PCP:  Patient, No Pcp Per Pharmacy:   [id number] - [place], [place] - [address] AT [facility] OF [address] [place]  [address] Phone: [phone] Fax: [phone]     Social Drivers of Health (SDOH) Social History: SDOH Screenings   Food Insecurity: No Food Insecurity (01/14/2024)  Housing: Low Risk  (01/14/2024)  Transportation Needs: No Transportation Needs (01/15/2024)  Utilities: Not At Risk (01/14/2024)  Financial Resource Strain: Low Risk  (09/25/2020)   [facility] from [facility]  Social Connections: Moderately Isolated (01/16/2024)  Tobacco Use: Low Risk  (12/13/2023)   Received from [facility]   SDOH Interventions:     Readmission Risk Interventions    01/17/2024   10:17 AM 10/17/2023    3:58 PM  Readmission Risk Prevention Plan  PCP or Specialist Appt within 3-5 Days  Complete  Social Work Consult for Recovery  Care Planning/Counseling  Complete  Palliative Care Screening  Not Applicable  Medication Review Oceanographer) Complete   Palliative Care Screening Complete   Skilled Nursing Facility Complete

## 2024-01-17 NOTE — Hospital Course (Signed)
 From HPI Traci Carter is a 78 y.o. female with medical history significant of type 2 diabetes, essential hypertension, history of CVA, hyperlipidemia, chronic kidney disease stage III, obstructive sleep apnea, status post pacemaker, history of intracranial hemorrhage fibromyalgia, seizure disorder, depression and anxiety who was brought in by EMS from [facility] for abnormal labs.  Patient had routine blood work today that showed worsening renal function with a GFR of only 50.  Also elevated potassium.  She is drowsy at the moment but denied any complaint.  Seen by nephrology, has refused dialysis Over the course of the last 3 days, patient was placed on fluids, renal function continued to deteriorate.  Sodium level still low.  Nephrology has recommended palliative care/hospice. Patient was seen by palliative care, transitioned to comfort care

## 2024-01-17 NOTE — Consult Note (Signed)
 Consultation Note Date: 01/17/2024 at 1400  Patient Name: Traci Carter  DOB: 1945/07/24  MRN: [id number]  Age / Sex: 78 y.o., female  PCP: Patient, No Pcp Per Referring Physician: Laurita Pillion, MD  HPI/Patient Profile: 78 y.o. female  with past medical history of type 2 diabetes, HTN, history of CVA, HLD, CKD (stage III), OSA, s/p pacemaker, history of ICH, fibromyalgia, seizure disorder, depression, anxiety admitted from SNF rehab at [facility] on 01/13/2024 with abnormal labs.  Patient's labs apparently showed worsening renal function and elevated potassium.  Patient is being treated for AKI with hyperkalemia, metabolic acidosis, and severe hyponatremia.  Nephrology, PT, and OT are following.  PMT was consulted to support patient and family with goals of care discussions.   Clinical Assessment and Goals of Care: Extensive chart review completed prior to meeting patient including labs, vital signs, imaging, progress notes, orders, and available advanced directive documents from current and previous encounters. I then met with patient at bedside to discuss diagnosis prognosis, GOC, EOL wishes, disposition and options.  I introduced Palliative Medicine as specialized medical care for people living with serious illness. It focuses on providing relief from the symptoms and stress of a serious illness. The goal is to improve quality of life for both the patient and the family.  She shares appreciation for my visit but states, I do not want to talk about any of this right now.  She declined to engage in conversation further.  She did confirm that she wishes to avoid dialysis.  Additionally, she stated that she knows she is going to die and just wants to know what can be done so that is not the painful left.  Education provided on comfort measures, aggressive symptom management, and relieving patient  from pain and suffering as she makes her transition to end-of-life.  Again, patient stated she did not want to talk about it and appeared to roll over to fall asleep.  Patient called out for her daughter [name] and looked to her left side, continuing to call her name.  Advised patient that [name] was not in the room.  Patient seems surprised by this but was easily redirected back to our conversation.  Patient remains unable to participate in goals of care or medical decision making independently at this time.  She is able to participate in symptom assessment.  She denies pain, headache, chest pain, N/V/D, no other acute issues at this time.  No adjustment to [facility] needed.  After visiting with the patient, I spoke with her daughter [name] over the phone.  Medical update given.  Education bided on rising creatinine (11/2018 5-1.62 today's creatinine of 6.4).  Reviewed that without dialysis and aggressive management of creatinine, patient is facing end-of-life.    Space and opportunity provided for [name] to share her thoughts and emotions regarding her mother's current medical situation.  She is to remain supportive mother's decision to avoid hemodialysis.  [name] understands the patient will place end-of-life as creatinine begins to rise.  Education provided on  electrolyte derangement and effects of elevated creatinine when patients avoid hemodialysis.  Comfort measures discussed in detail.We discussed comfort measures. Mrs. Levay would no longer receive aggressive medical interventions such as continuous vital signs, lab work, radiology testing, or medications not focused on comfort. All care would focus on how she is looking and feeling. This would include management of any symptoms that may cause discomfort, pain, shortness of breath, cough, nausea, agitation, anxiety, and/or secretions etc. Symptoms would be managed with medications and other non-pharmacological interventions such as spiritual  support if requested, repositioning, music therapy, or therapeutic listening.   Christy verbalized understanding and appreciation.   Attempted to elicit values and goals important to the patient and family.  [name] shares that she wants to have her mother be free from pain and suffering because she knows the end of her life is approaching.  [name] also shares she has not fully been able to grieve her father's passing which happened in August of this year.  She shares her mother has been seeing and speaking to her dead father, and mentioning that she is ready to be with him.  Discussed patient is experiencing visioning. These are often comforting visions of loved ones or past events that are not a sign of illness or sickness but rather a normal and often peaceful part of the dying process.   [name] is in agreement to shift focus of care to comfort only.  CMO adjustment made to [facility].  She shares that she would like for her mother to return to [facility] with full comfort measures in place.  Advised that as per TOC, patient cannot return to [facility] for comfort measures unless it is from private pay, [name] shares she cannot afford  Alternatively, I discussed potential admission to hospice inpatient unit.  Hospice services, aging in place, and hospice philosophy discussed in detail.  [name] shares she would like for her mother to be evaluated for hospice home.   Attending, RN, and TOC made aware of Christy's request for hospice inpatient evaluation.  TOC to offer choice of hospice facilities.  Care is now solely focused on comfort.  PMT will continue to follow and support.  Awaiting response from hospice for next steps and plan of care.  Primary Decision Maker NEXT OF KIN  Physical Exam Vitals reviewed.  Constitutional:      General: She is not in acute distress.    Appearance: She is obese.  HENT:     Head: Normocephalic.  Skin:    General: Skin is dry.     Coloration: Skin is  pale.  Neurological:     Mental Status: She is alert.     Comments: Oriented to self and place     Palliative Assessment/Data: 30%     Thank you for this consult. Palliative medicine will continue to follow and assist holistically.   Visit includes: Detailed review of medical records (labs, imaging, vital signs), medically appropriate exam (mental status, respiratory, cardiac, skin), discussed with treatment team, counseling and educating patient, family and staff, documenting clinical information, medication management and coordination of care.  Signed by: Lamarr Gunner, DNP, FNP-BC Palliative Medicine   Please contact Palliative Medicine Team providers via [facility] for questions and concerns.

## 2024-01-18 DIAGNOSIS — E875 Hyperkalemia: Secondary | ICD-10-CM | POA: Diagnosis not present

## 2024-01-18 DIAGNOSIS — E871 Hypo-osmolality and hyponatremia: Secondary | ICD-10-CM | POA: Diagnosis not present

## 2024-01-18 DIAGNOSIS — E66813 Obesity, class 3: Secondary | ICD-10-CM | POA: Diagnosis not present

## 2024-01-18 DIAGNOSIS — Z515 Encounter for palliative care: Secondary | ICD-10-CM | POA: Diagnosis not present

## 2024-01-18 DIAGNOSIS — Z8673 Personal history of transient ischemic attack (TIA), and cerebral infarction without residual deficits: Secondary | ICD-10-CM

## 2024-01-18 DIAGNOSIS — N179 Acute kidney failure, unspecified: Secondary | ICD-10-CM | POA: Diagnosis not present

## 2024-01-18 DIAGNOSIS — E162 Hypoglycemia, unspecified: Secondary | ICD-10-CM | POA: Diagnosis not present

## 2024-01-18 MED ORDER — HALOPERIDOL LACTATE 5 MG/ML IJ SOLN: 0.5000 mg | INTRAMUSCULAR | Status: DC | PRN

## 2024-01-18 MED ORDER — HALOPERIDOL LACTATE 2 MG/ML PO CONC: 0.5000 mg | ORAL | Status: DC | PRN

## 2024-01-18 MED ORDER — GLYCOPYRROLATE 0.2 MG/ML IJ SOLN: 0.2000 mg | INTRAMUSCULAR | Status: DC | PRN

## 2024-01-18 MED ORDER — HALOPERIDOL 0.5 MG PO TABS: 0.5000 mg | ORAL_TABLET | ORAL | Status: DC | PRN

## 2024-01-18 MED ORDER — GLYCOPYRROLATE 1 MG PO TABS: 1.0000 mg | ORAL_TABLET | ORAL | Status: DC | PRN

## 2024-01-18 MED ORDER — FENTANYL CITRATE (PF) 50 MCG/ML IJ SOSY: 12.5000 ug | PREFILLED_SYRINGE | INTRAMUSCULAR | Status: DC | PRN

## 2024-01-18 MED ORDER — BIOTENE DRY MOUTH MT LIQD: 15.0000 mL | OROMUCOSAL | Status: DC | PRN

## 2024-01-18 MED ORDER — POLYVINYL ALCOHOL 1.4 % OP SOLN: 1.0000 [drp] | Freq: Four times a day (QID) | OPHTHALMIC | Status: DC | PRN

## 2024-01-18 NOTE — Discharge Summary (Signed)
 Physician Discharge Summary   Patient: Traci Carter MRN: [id number] DOB: 1945/09/05  Admit date:     01/13/2024  Discharge date: 01/18/24  Discharge Physician: Murvin Mana   PCP: Patient, No Pcp Per   Recommendations at discharge:    Follow with hospice  Discharge Diagnoses: Principal Problem:   AKI (acute kidney injury) Active Problems:   Diabetes (HCC)   Essential hypertension   Paroxysmal atrial fibrillation (HCC)   Hyperlipidemia associated with type 2 diabetes mellitus (HCC)   Pacemaker   History of CVA (cerebrovascular accident)   OSA (obstructive sleep apnea)   CAD (coronary artery disease)   Diabetes mellitus type 2, uncomplicated (HCC)   CKD (chronic kidney disease), stage IIIa   Class 3 obesity (HCC)   Hyponatremia  Resolved Problems:   * No resolved hospital problems. [facility] Course: From HPI Traci Carter is a 78 y.o. female with medical history significant of type 2 diabetes, essential hypertension, history of CVA, hyperlipidemia, chronic kidney disease stage III, obstructive sleep apnea, status post pacemaker, history of intracranial hemorrhage fibromyalgia, seizure disorder, depression and anxiety who was brought in by EMS from [facility] for abnormal labs.  Patient had routine blood work today that showed worsening renal function with a GFR of only 50.  Also elevated potassium.  She is drowsy at the moment but denied any complaint.  Seen by nephrology, has refused dialysis Over the course of the last 3 days, patient was placed on fluids, renal function continued to deteriorate.  Sodium level still low.  Nephrology has recommended palliative care/hospice. Patient was seen by palliative care, transitioned to comfort care  Assessment and Plan: Acute renal failure on chronic kidney disease stage IIIa. Hyperkalemia secondary to acute renal failure. Metabolic acidosis secondary to kidney failure. Severe hyponatremia secondary to renal  failure. Patient renal function continued to deteriorate so after fluids.  Bladder scan did not show any urinary retention. Patient renal function could be natural progression approaching to the end stage. Discussed with patient and patient daughter, patient does continue to refuse any consideration of dialysis. With the progression of her disease, patient life expectancy probably is less than 3 months. Patient is transitioned to comfort care after seen by palliative care.   Essential hypertension. No additional treatment   Paroxysmal atrial fibrillation. Discontinued Eliquis .   Obstructive sleep apnea. Class III obesity with BMI of 41.83. Continue comfort care.   History of hemorrhagic stroke. Possible dementia Continue comfort care.         Consultants: Palliatiive Procedures performed: None  Disposition: Hospice care Diet recommendation:  Discharge Diet Orders (From admission, onward)     Start     Ordered   01/18/24 0000  Diet - low sodium heart healthy        01/18/24 1241           Regular diet DISCHARGE MEDICATION: Allergies as of 01/18/2024       Reactions   Codeine Other (See Comments)   GI Distress GI Distress GI Distress   Codeine    Digoxin    Other reaction(s): Unknown   Lactose Intolerance (gi)    Lanoxin [digoxin]    Penicillins    Zoloft [sertraline Hcl]    Penicillins Rash, Other (See Comments)        Medication List     STOP taking these medications    albuterol  (2.5 MG/3ML) 0.083% nebulizer solution Commonly known as: PROVENTIL    albuterol  108 (90 Base) MCG/ACT inhaler Commonly  known as: VENTOLIN  HFA   allopurinol  100 MG tablet Commonly known as: ZYLOPRIM    ALPRAZolam  0.25 MG dissolvable tablet Commonly known as: NIRAVAM    atorvastatin  20 MG tablet Commonly known as: LIPITOR   cetirizine 10 MG tablet Commonly known as: ZYRTEC   colchicine  0.6 MG tablet   Eliquis  5 MG Tabs tablet Generic drug: apixaban     famotidine  20 MG tablet Commonly known as: PEPCID    furosemide  20 MG tablet Commonly known as: LASIX    glipiZIDE  2.5 MG 24 hr tablet Commonly known as: GLUCOTROL  XL   guaiFENesin  600 MG 12 hr tablet Commonly known as: MUCINEX    ipratropium-albuterol  0.5-2.5 (3) MG/3ML Soln Commonly known as: DUONEB   lisinopril  10 MG tablet Commonly known as: ZESTRIL    Magnesium  Citrate 100 MG Tabs   metFORMIN  1000 MG tablet Commonly known as: GLUCOPHAGE    naloxone 4 MG/0.1ML Liqd nasal spray kit Commonly known as: NARCAN       TAKE these medications    acetaminophen  325 MG tablet Commonly known as: TYLENOL  Take 2 tablets (650 mg total) by mouth every 6 (six) hours as needed for mild pain, headache, fever or moderate pain.   antiseptic oral rinse Liqd Apply 15 mLs topically as needed for dry mouth.   artificial tears ophthalmic solution Place 1 drop into both eyes 4 (four) times daily as needed for dry eyes.   fentaNYL 50 MCG/ML Sosy injection Commonly known as: SUBLIMAZE Inject 0.25 mLs (12.5 mcg total) into the vein every 2 (two) hours as needed for severe pain (pain score 7-10).   glycopyrrolate 1 MG tablet Commonly known as: ROBINUL Take 1 tablet (1 mg total) by mouth every 4 (four) hours as needed (excessive secretions).   glycopyrrolate 0.2 MG/ML injection Commonly known as: ROBINUL Inject 1 mL (0.2 mg total) into the vein every 4 (four) hours as needed (excessive secretions).   haloperidol 0.5 MG tablet Commonly known as: HALDOL Take 1 tablet (0.5 mg total) by mouth every 4 (four) hours as needed for agitation (or delirium).   haloperidol 2 MG/ML solution Commonly known as: HALDOL Place 0.3 mLs (0.6 mg total) under the tongue every 4 (four) hours as needed for agitation (or delirium).   haloperidol lactate 5 MG/ML injection Commonly known as: HALDOL Inject 0.1 mLs (0.5 mg total) into the vein every 4 (four) hours as needed (or delirium).   hydrOXYzine  25 MG  tablet Commonly known as: ATARAX  Take 25 mg by mouth every 8 (eight) hours as needed for anxiety.   levETIRAcetam  1000 MG tablet Commonly known as: KEPPRA  Take 1,000 mg by mouth 2 (two) times daily.   ondansetron  4 MG disintegrating tablet Commonly known as: ZOFRAN -ODT Take 4 mg by mouth every 6 (six) hours as needed for nausea.   polyethylene glycol 17 g packet Commonly known as: MIRALAX  / GLYCOLAX  Take 17 g by mouth daily as needed for mild constipation.   pregabalin  150 MG capsule Commonly known as: LYRICA  Take 150 mg by mouth 2 (two) times daily.   QUEtiapine  25 MG tablet Commonly known as: SEROQUEL  Take 25 mg by mouth at bedtime.               Discharge Care Instructions  (From admission, onward)           Start     Ordered   01/18/24 0000  Discharge wound care:       Comments: Follow with hospice   01/18/24 1241  Discharge Exam: Filed Weights   01/15/24 0500 01/17/24 0500  Weight: 103.6 kg 107.1 kg   General exam: Appears calm and comfortable  Respiratory system: Clear to auscultation. Respiratory effort normal. Cardiovascular system: S1 & S2 heard, RRR. No JVD, murmurs, rubs, gallops or clicks. No pedal edema. Gastrointestinal system: Abdomen is nondistended, soft and nontender. No organomegaly or masses felt. Normal bowel sounds heard. Central nervous system: Alert and oriented x1. No focal neurological deficits. Extremities: Symmetric 5 x 5 power. Skin: No rashes, lesions or ulcers Psychiatry:flat affect   Condition at discharge: poor  The results of significant diagnostics from this hospitalization (including imaging, microbiology, ancillary and laboratory) are listed below for reference.   Imaging Studies: US  Renal Result Date: 01/13/2024 CLINICAL DATA:  Renal failure EXAM: RENAL / URINARY TRACT ULTRASOUND COMPLETE COMPARISON:  None Available. FINDINGS: Right Kidney: Renal measurements: 9.8 x 5.0 x 5.3 cm. = volume: 137 mL.  Diffuse increased echogenicity is noted. No mass or hydronephrosis is seen. Left Kidney: Renal measurements: 9.6 x 4.9 x 5.1 cm. = volume: 121 mL. Increased echogenicity is noted. No mass or hydronephrosis is seen. Bladder: Decompressed Other: None. IMPRESSION: Changes consistent with medical renal disease. No acute abnormality noted. Electronically Signed   By: Oneil Devonshire M.D.   On: 01/13/2024 19:40    Microbiology: Results for orders placed or performed during the hospital encounter of 01/13/24  MRSA Next Gen by PCR, Nasal     Status: Abnormal   Collection Time: 01/14/24 10:55 PM   Specimen: Nasal Mucosa; Nasal Swab  Result Value Ref Range Status   MRSA by PCR Next Gen DETECTED (A) NOT DETECTED Final    Comment: RESULT CALLED TO, READ BACK BY AND VERIFIED WITH: TAMARA CONYERS 01/15/24 0100 KLW (NOTE) The GeneXpert MRSA Assay (FDA approved for NASAL specimens only), is one component of a comprehensive MRSA colonization surveillance program. It is not intended to diagnose MRSA infection nor to guide or monitor treatment for MRSA infections. Test performance is not FDA approved in patients less than 44 years old. Performed at [facility], [address] [place], [place] [address]     Labs: CBC: Recent Labs  Lab 01/13/24 1745 01/14/24 0205 01/14/24 0541 01/16/24 0339 01/17/24 0603  WBC 8.2 8.0 7.6 7.5 8.3  NEUTROABS 5.2  --   --  4.2 6.0  HGB 10.4* 8.9* 8.5* 8.5* 8.4*  HCT 31.7* 26.5* 25.0* 24.5* 24.6*  MCV 92.7 91.4 90.3 87.2 86.6  PLT 321 254 237 227 200   Basic Metabolic Panel: Recent Labs  Lab 01/13/24 1745 01/14/24 0205 01/15/24 0814 01/16/24 0339 01/17/24 0603  NA 127* 128* 125* 121* 123*  K 6.0* 5.4* 4.6 4.6 4.5  CL 96* 93* 91* 85* 80*  CO2 14* 18* 19* 23 28  GLUCOSE 22* 59* 95 95 97  BUN 65* 57* 57* 67* 64*  CREATININE 6.38* 6.22*  6.24* 6.12* 6.41* 6.62*  CALCIUM  8.5* 8.5* 7.7* 7.9* 7.5*  MG 1.7  --   --   --   --   PHOS 4.8* 4.7* 3.9  --    --    Liver Function Tests: Recent Labs  Lab 01/13/24 1745 01/14/24 0205 01/15/24 0814  AST 24 30  --   ALT 13 10  --   ALKPHOS 89 78  --   BILITOT 0.4 0.8  --   PROT 5.8* 4.7*  --   ALBUMIN  2.8* 2.2* 2.2*   CBG: Recent Labs  Lab 01/16/24 2132 01/17/24  9185 01/17/24 1135 01/17/24 1601 01/17/24 2150  GLUCAP 119* 83 137* 122* 161*    Discharge time spent: 36 minutes.  Signed: Murvin Mana, MD [facility] 01/18/2024

## 2024-01-18 NOTE — Progress Notes (Signed)
 Progress Note   Patient: Traci Carter FMW:[id number] DOB: 1946/03/08 DOA: 01/13/2024     5 DOS: the patient was seen and examined on 01/18/2024   Brief hospital course: From HPI Lyndall Bellot is a 78 y.o. female with medical history significant of type 2 diabetes, essential hypertension, history of CVA, hyperlipidemia, chronic kidney disease stage III, obstructive sleep apnea, status post pacemaker, history of intracranial hemorrhage fibromyalgia, seizure disorder, depression and anxiety who was brought in by EMS from [facility] for abnormal labs.  Patient had routine blood work today that showed worsening renal function with a GFR of only 50.  Also elevated potassium.  She is drowsy at the moment but denied any complaint.  Seen by nephrology, has refused dialysis Over the course of the last 3 days, patient was placed on fluids, renal function continued to deteriorate.  Sodium level still low.  Nephrology has recommended palliative care/hospice. Patient was seen by palliative care, transitioned to comfort care   Principal Problem:   AKI (acute kidney injury) Active Problems:   Diabetes (HCC)   Essential hypertension   Paroxysmal atrial fibrillation (HCC)   Hyperlipidemia associated with type 2 diabetes mellitus (HCC)   Pacemaker   History of CVA (cerebrovascular accident)   OSA (obstructive sleep apnea)   CAD (coronary artery disease)   Diabetes mellitus type 2, uncomplicated (HCC)   CKD (chronic kidney disease), stage IIIa   Class 3 obesity (HCC)   Hyponatremia   Assessment and Plan: Acute renal failure on chronic kidney disease stage IIIa. Hyperkalemia secondary to acute renal failure. Metabolic acidosis secondary to kidney failure. Severe hyponatremia secondary to renal failure. Patient renal function continued to deteriorate so after fluids.  Bladder scan did not show any urinary retention. Patient renal function could be natural progression approaching to  the end stage. Discussed with patient and patient daughter, patient does continue to refuse any consideration of dialysis. With the progression of her disease, patient life expectancy probably is less than 3 months. Patient is transitioned to comfort care after seen by palliative care.   Essential hypertension. No additional treatment   Paroxysmal atrial fibrillation. Discontinued Eliquis .   Obstructive sleep apnea. Class III obesity with BMI of 41.83. Continue comfort care.   History of hemorrhagic stroke. Possible dementia Continue comfort care.       Subjective:  Patient has some confusion, denies any pain or shortness of breath.  Physical Exam: Vitals:   01/17/24 0500 01/17/24 0819 01/17/24 1959 01/18/24 0817  BP:  (!) 104/45 (!) 96/51 (!) 87/49  Pulse:  67 65 62  Resp:  20 20 17   Temp:  98.2 F (36.8 C) 97.8 F (36.6 C) 98.1 F (36.7 C)  TempSrc:      SpO2:  100% 100% 99%  Weight: 107.1 kg      General exam: Ill-appearing, obese Respiratory system: Clear to auscultation. Respiratory effort normal. Cardiovascular system: S1 & S2 heard, RRR. No JVD, murmurs, rubs, gallops or clicks. No pedal edema. Gastrointestinal system: Abdomen is nondistended, soft and nontender. No organomegaly or masses felt. Normal bowel sounds heard. Central nervous system: Alert and oriented x3. No focal neurological deficits. Extremities: Symmetric 5 x 5 power. Skin: No rashes, lesions or ulcers Psychiatry: Flat affect.   Data Reviewed:  There are no new results to review at this time.  Family Communication: None  Disposition: Status is: Inpatient Remains inpatient appropriate because: Comfort care.     Time spent: 35 minutes  Author: Murvin Mana, MD  01/18/2024 10:54 AM  For on call review [email or web address].

## 2024-01-18 NOTE — Plan of Care (Signed)

## 2024-01-18 NOTE — Progress Notes (Signed)
 [facility] Liaison Note  Received request from Transitions of Care Manger for family interest in [facility].    Eligibility has been confirmed. Spoke with duaghter  to confirm interest and explain services.  Family agreeable to transfer today.  Transitions of Care manager aware.  RN please call report to [phone] [facility]) prior to patient leaving the unit.  Please send signed and completed DNR with patient at discharge.  Thank you  Marinell Nova,  [facility] Liaison 336 [phone]

## 2024-01-18 NOTE — Progress Notes (Signed)
                                                     Palliative Care Progress Note, Assessment & Plan   Patient Name: Traci Carter       Date: 01/18/2024 DOB: January 21, 1946  Age: 78 y.o. MRN#: [id number] Attending Physician: Laurita Pillion, MD Primary Care Physician: Patient, No Pcp Per Admit Date: 01/13/2024  Subjective: Patient is lying in bed on her right side.  She is asleep but easily awakens to my presence.  She makes eye contact with me but no vocalizations during my visit.  She quickly returns back to sleep.  No friends or family present at bedside during my visit.  HPI: 78 y.o. female  with past medical history of type 2 diabetes, HTN, history of CVA, HLD, CKD (stage III), OSA, s/p pacemaker, history of ICH, fibromyalgia, seizure disorder, depression, anxiety admitted from SNF rehab at [facility] on 01/13/2024 with abnormal labs.   Patient's labs apparently showed worsening renal function and elevated potassium.   Patient is being treated for AKI with hyperkalemia, metabolic acidosis, and severe hyponatremia.   Nephrology, PT, and OT were following.  However, after goals of care discussions with patient's daughter plan has been shifted to comfort focused care only.   PMT was consulted to support patient and family with goals of care discussions.  Summary of counseling/coordination of care: Extensive chart review completed prior to meeting patient including labs, vital signs, imaging, progress notes, orders, and available advanced directive documents from current and previous encounters.   After reviewing the patient's chart and assessing the patient at bedside, I spoke with patient in regards to symptom management and goals of care.   Patient did not stay awake long enough to participate in his symptom assessment.  Nonverbal signs of  distress such as increased respiratory rate, moaning/groaning, fidgeting, and brow furrowing not noted.  No adjustment to [facility] needed at this time.  Awaiting evaluation from hospice for potential placement in inpatient unit.  PMT will continue to follow and support.  Physical Exam Vitals reviewed.  Constitutional:      General: She is not in acute distress.    Appearance: She is obese.  HENT:     Head: Normocephalic.     Mouth/Throat:     Mouth: Mucous membranes are moist.  Eyes:     Pupils: Pupils are equal, round, and reactive to light.  Pulmonary:     Effort: Pulmonary effort is normal.  Abdominal:     Palpations: Abdomen is soft.  Skin:    General: Skin is warm and dry.     Coloration: Skin is pale.  Psychiatric:        Behavior: Behavior normal.             Visit includes: Detailed review of medical records (labs, imaging, vital signs), medically appropriate exam (mental status, respiratory, cardiac, skin), discussed with treatment team, counseling and educating patient, family and staff, documenting clinical information, medication management and coordination of care.  Lamarr L. Arvid, DNP, FNP-BC Palliative Medicine Team

## 2024-01-18 NOTE — Progress Notes (Signed)
 Patient 7th on list to be picked up by [facility]

## 2024-01-18 NOTE — Progress Notes (Signed)
 Report called to hospice house for patient being discharged today

## 2024-01-18 NOTE — Plan of Care (Signed)
  Problem: Fluid Volume: Goal: Ability to maintain a balanced intake and output will improve Outcome: Progressing   Problem: Nutrition: Goal: Adequate nutrition will be maintained Outcome: Progressing   Problem: Elimination: Goal: Will not experience complications related to bowel motility Outcome: Progressing Goal: Will not experience complications related to urinary retention Outcome: Progressing   Problem: Pain Managment: Goal: General experience of comfort will improve and/or be controlled Outcome: Progressing   Problem: Safety: Goal: Ability to remain free from injury will improve Outcome: Progressing

## 2024-01-18 NOTE — TOC Transition Note (Signed)
 Transition of Care [facility]) - Discharge Note   Patient Details  Name: Tharon Kitch MRN: [id number] Date of Birth: 1945/04/15  [facility] CM/SW Contact:  Corean ONEIDA Haddock, RN Phone Number: 01/18/2024, 1:13 PM   Clinical Narrative:    Notified that patient to be discharged to hospice home today Marinell with [facility] has coordinated dc.  EMS packet printed to unit         Patient Goals and CMS Choice            Discharge Placement                       Discharge Plan and Services Additional resources added to the After Visit Summary for                                       Social Drivers of Health (SDOH) Interventions SDOH Screenings   Food Insecurity: No Food Insecurity (01/14/2024)  Housing: Low Risk  (01/14/2024)  Transportation Needs: No Transportation Needs (01/15/2024)  Utilities: Not At Risk (01/14/2024)  Financial Resource Strain: Low Risk  (09/25/2020)   Received from [facility]  Social Connections: Moderately Isolated (01/16/2024)  Tobacco Use: Low Risk  (12/13/2023)   Received from [facility]     Readmission Risk Interventions    01/17/2024   10:17 AM 10/17/2023    3:58 PM  Readmission Risk Prevention Plan  PCP or Specialist Appt within 3-5 Days  Complete  Social Work Consult for Recovery Care Planning/Counseling  Complete  Palliative Care Screening  Not Applicable  Medication Review [employer]) Complete   Palliative Care Screening Complete   Skilled Nursing Facility Complete

## 2024-03-04 DEATH — deceased
